# Patient Record
Sex: Male | Born: 1955 | Race: White | Hispanic: No | Marital: Married | Smoking: Never smoker
Health system: Southern US, Community
[De-identification: ages and names within clinical notes are randomized; demographics above are authoritative.]

## PROBLEM LIST (undated history)

## (undated) DIAGNOSIS — G459 Transient cerebral ischemic attack, unspecified: Secondary | ICD-10-CM

## (undated) DIAGNOSIS — N400 Enlarged prostate without lower urinary tract symptoms: Secondary | ICD-10-CM

## (undated) DIAGNOSIS — R7303 Prediabetes: Secondary | ICD-10-CM

## (undated) DIAGNOSIS — E785 Hyperlipidemia, unspecified: Secondary | ICD-10-CM

## (undated) DIAGNOSIS — I10 Essential (primary) hypertension: Secondary | ICD-10-CM

## (undated) DIAGNOSIS — F419 Anxiety disorder, unspecified: Secondary | ICD-10-CM

## (undated) HISTORY — PX: HERNIA REPAIR: SHX51

## (undated) HISTORY — PX: BACK SURGERY: SHX140

---

## 2013-11-27 DIAGNOSIS — M545 Low back pain, unspecified: Secondary | ICD-10-CM | POA: Insufficient documentation

## 2014-09-16 DIAGNOSIS — E113291 Type 2 diabetes mellitus with mild nonproliferative diabetic retinopathy without macular edema, right eye: Secondary | ICD-10-CM | POA: Insufficient documentation

## 2015-04-14 DIAGNOSIS — M25521 Pain in right elbow: Secondary | ICD-10-CM | POA: Insufficient documentation

## 2016-12-29 ENCOUNTER — Other Ambulatory Visit: Payer: Self-pay | Admitting: Orthopedic Surgery

## 2016-12-29 DIAGNOSIS — M5442 Lumbago with sciatica, left side: Secondary | ICD-10-CM

## 2016-12-29 DIAGNOSIS — M545 Low back pain: Secondary | ICD-10-CM

## 2016-12-29 DIAGNOSIS — M5441 Lumbago with sciatica, right side: Secondary | ICD-10-CM

## 2016-12-29 DIAGNOSIS — G8929 Other chronic pain: Secondary | ICD-10-CM

## 2017-01-10 ENCOUNTER — Ambulatory Visit
Admission: RE | Admit: 2017-01-10 | Discharge: 2017-01-10 | Disposition: A | Discharge status: Home / Self Care | Payer: Medicare HMO | Source: Ambulatory Visit | Attending: Orthopedic Surgery | Admitting: Orthopedic Surgery

## 2017-01-10 DIAGNOSIS — M5441 Lumbago with sciatica, right side: Secondary | ICD-10-CM | POA: Insufficient documentation

## 2017-01-10 DIAGNOSIS — M5136 Other intervertebral disc degeneration, lumbar region: Secondary | ICD-10-CM | POA: Diagnosis not present

## 2017-01-10 DIAGNOSIS — M545 Low back pain: Secondary | ICD-10-CM

## 2017-01-10 DIAGNOSIS — G8929 Other chronic pain: Secondary | ICD-10-CM | POA: Diagnosis not present

## 2017-01-10 DIAGNOSIS — M5442 Lumbago with sciatica, left side: Secondary | ICD-10-CM | POA: Insufficient documentation

## 2017-01-10 DIAGNOSIS — M48061 Spinal stenosis, lumbar region without neurogenic claudication: Secondary | ICD-10-CM | POA: Insufficient documentation

## 2017-07-27 ENCOUNTER — Other Ambulatory Visit: Payer: Self-pay | Admitting: Student

## 2017-07-27 ENCOUNTER — Other Ambulatory Visit: Payer: Self-pay | Admitting: Neurosurgery

## 2017-07-27 DIAGNOSIS — G9519 Other vascular myelopathies: Secondary | ICD-10-CM

## 2017-07-27 DIAGNOSIS — M48062 Spinal stenosis, lumbar region with neurogenic claudication: Secondary | ICD-10-CM

## 2017-08-08 ENCOUNTER — Ambulatory Visit
Admission: RE | Admit: 2017-08-08 | Discharge: 2017-08-08 | Disposition: A | Discharge status: Home / Self Care | Payer: Medicare HMO | Source: Ambulatory Visit | Attending: Student | Admitting: Student

## 2017-08-08 DIAGNOSIS — M48062 Spinal stenosis, lumbar region with neurogenic claudication: Secondary | ICD-10-CM | POA: Diagnosis present

## 2017-08-08 DIAGNOSIS — M5136 Other intervertebral disc degeneration, lumbar region: Secondary | ICD-10-CM | POA: Insufficient documentation

## 2017-08-08 DIAGNOSIS — M5126 Other intervertebral disc displacement, lumbar region: Secondary | ICD-10-CM | POA: Diagnosis not present

## 2017-08-08 DIAGNOSIS — M2578 Osteophyte, vertebrae: Secondary | ICD-10-CM | POA: Insufficient documentation

## 2017-08-08 DIAGNOSIS — G9519 Other vascular myelopathies: Secondary | ICD-10-CM

## 2017-09-06 DIAGNOSIS — E119 Type 2 diabetes mellitus without complications: Secondary | ICD-10-CM | POA: Insufficient documentation

## 2019-04-04 ENCOUNTER — Observation Stay
Admission: EM | Admit: 2019-04-04 | Discharge: 2019-04-05 | Disposition: A | Discharge status: Home / Self Care | Payer: Medicare HMO | Attending: Internal Medicine | Admitting: Internal Medicine

## 2019-04-04 ENCOUNTER — Emergency Department: Payer: Medicare HMO

## 2019-04-04 ENCOUNTER — Other Ambulatory Visit: Payer: Self-pay

## 2019-04-04 DIAGNOSIS — Z833 Family history of diabetes mellitus: Secondary | ICD-10-CM | POA: Insufficient documentation

## 2019-04-04 DIAGNOSIS — Z6838 Body mass index (BMI) 38.0-38.9, adult: Secondary | ICD-10-CM | POA: Diagnosis not present

## 2019-04-04 DIAGNOSIS — Z79899 Other long term (current) drug therapy: Secondary | ICD-10-CM | POA: Diagnosis not present

## 2019-04-04 DIAGNOSIS — R29702 NIHSS score 2: Secondary | ICD-10-CM | POA: Diagnosis not present

## 2019-04-04 DIAGNOSIS — K219 Gastro-esophageal reflux disease without esophagitis: Secondary | ICD-10-CM | POA: Diagnosis not present

## 2019-04-04 DIAGNOSIS — I63422 Cerebral infarction due to embolism of left anterior cerebral artery: Secondary | ICD-10-CM

## 2019-04-04 DIAGNOSIS — I639 Cerebral infarction, unspecified: Secondary | ICD-10-CM | POA: Diagnosis not present

## 2019-04-04 DIAGNOSIS — R2681 Unsteadiness on feet: Secondary | ICD-10-CM | POA: Diagnosis not present

## 2019-04-04 DIAGNOSIS — Z20822 Contact with and (suspected) exposure to covid-19: Secondary | ICD-10-CM | POA: Insufficient documentation

## 2019-04-04 DIAGNOSIS — N400 Enlarged prostate without lower urinary tract symptoms: Secondary | ICD-10-CM | POA: Diagnosis not present

## 2019-04-04 DIAGNOSIS — Z7984 Long term (current) use of oral hypoglycemic drugs: Secondary | ICD-10-CM | POA: Diagnosis not present

## 2019-04-04 DIAGNOSIS — E1151 Type 2 diabetes mellitus with diabetic peripheral angiopathy without gangrene: Secondary | ICD-10-CM | POA: Diagnosis not present

## 2019-04-04 DIAGNOSIS — I10 Essential (primary) hypertension: Secondary | ICD-10-CM | POA: Diagnosis not present

## 2019-04-04 DIAGNOSIS — E1169 Type 2 diabetes mellitus with other specified complication: Secondary | ICD-10-CM | POA: Diagnosis not present

## 2019-04-04 DIAGNOSIS — Z8249 Family history of ischemic heart disease and other diseases of the circulatory system: Secondary | ICD-10-CM | POA: Insufficient documentation

## 2019-04-04 DIAGNOSIS — E785 Hyperlipidemia, unspecified: Secondary | ICD-10-CM | POA: Diagnosis not present

## 2019-04-04 DIAGNOSIS — R471 Dysarthria and anarthria: Secondary | ICD-10-CM | POA: Diagnosis present

## 2019-04-04 DIAGNOSIS — E669 Obesity, unspecified: Secondary | ICD-10-CM

## 2019-04-04 HISTORY — DX: Benign prostatic hyperplasia without lower urinary tract symptoms: N40.0

## 2019-04-04 HISTORY — DX: Anxiety disorder, unspecified: F41.9

## 2019-04-04 HISTORY — DX: Transient cerebral ischemic attack, unspecified: G45.9

## 2019-04-04 HISTORY — DX: Essential (primary) hypertension: I10

## 2019-04-04 LAB — COMPREHENSIVE METABOLIC PANEL
ALT: 16 U/L (ref 0–44)
AST: 17 U/L (ref 15–41)
Albumin: 4 g/dL (ref 3.5–5.0)
Alkaline Phosphatase: 76 U/L (ref 38–126)
Anion gap: 5 (ref 5–15)
BUN: 22 mg/dL (ref 8–23)
CO2: 25 mmol/L (ref 22–32)
Calcium: 9.2 mg/dL (ref 8.9–10.3)
Chloride: 110 mmol/L (ref 98–111)
Creatinine, Ser: 0.94 mg/dL (ref 0.61–1.24)
GFR calc Af Amer: >60 mL/min (ref >60)
GFR calc non Af Amer: >60 mL/min (ref >60)
Glucose, Bld: 149 mg/dL — ABNORMAL HIGH (ref 70–99)
Potassium: 4.1 mmol/L (ref 3.5–5.1)
Sodium: 140 mmol/L (ref 135–145)
Total Bilirubin: 0.8 mg/dL (ref 0.3–1.2)
Total Protein: 7.3 g/dL (ref 6.5–8.1)

## 2019-04-04 LAB — CBC
HCT: 43 % (ref 39.0–52.0)
Hemoglobin: 13.9 g/dL (ref 13.0–17.0)
MCH: 29.4 pg (ref 26.0–34.0)
MCHC: 32.3 g/dL (ref 30.0–36.0)
MCV: 91.1 fL (ref 80.0–100.0)
Platelets: 262 10*3/uL (ref 150–400)
RBC: 4.72 MIL/uL (ref 4.22–5.81)
RDW: 13.2 % (ref 11.5–15.5)
WBC: 7.7 10*3/uL (ref 4.0–10.5)
nRBC: 0 % (ref 0.0–0.2)

## 2019-04-04 LAB — APTT: aPTT: 35 seconds (ref 24–36)

## 2019-04-04 LAB — PROTIME-INR
INR: 1.1 (ref 0.8–1.2)
Prothrombin Time: 13.7 seconds (ref 11.4–15.2)

## 2019-04-04 LAB — DIFFERENTIAL
Abs Immature Granulocytes: 0.03 10*3/uL (ref 0.00–0.07)
Basophils Absolute: 0.1 10*3/uL (ref 0.0–0.1)
Basophils Relative: 1 %
Eosinophils Absolute: 0.2 10*3/uL (ref 0.0–0.5)
Eosinophils Relative: 2 %
Immature Granulocytes: 0 %
Lymphocytes Relative: 23 %
Lymphs Abs: 1.8 10*3/uL (ref 0.7–4.0)
Monocytes Absolute: 0.6 10*3/uL (ref 0.1–1.0)
Monocytes Relative: 8 %
Neutro Abs: 5.1 10*3/uL (ref 1.7–7.7)
Neutrophils Relative %: 66 %

## 2019-04-04 LAB — GLUCOSE, CAPILLARY
Glucose-Capillary: 125 mg/dL — ABNORMAL HIGH (ref 70–99)
Glucose-Capillary: 127 mg/dL — ABNORMAL HIGH (ref 70–99)

## 2019-04-04 MED ORDER — STROKE: EARLY STAGES OF RECOVERY BOOK: Freq: Once | Status: AC

## 2019-04-04 MED ORDER — ENOXAPARIN SODIUM 40 MG/0.4ML ~~LOC~~ SOLN
40.0000 mg | SUBCUTANEOUS | Status: DC
  Administered 2019-04-05: 40 mg via SUBCUTANEOUS
  Filled 2019-04-04: qty 0.4

## 2019-04-04 MED ORDER — ACETAMINOPHEN 650 MG RE SUPP: 650.0000 mg | Freq: Four times a day (QID) | RECTAL | Status: DC | PRN

## 2019-04-04 MED ORDER — SENNOSIDES-DOCUSATE SODIUM 8.6-50 MG PO TABS: 1.0000 | ORAL_TABLET | Freq: Every evening | ORAL | Status: DC | PRN

## 2019-04-04 MED ORDER — INSULIN ASPART 100 UNIT/ML ~~LOC~~ SOLN
0.0000 [IU] | Freq: Three times a day (TID) | SUBCUTANEOUS | Status: DC
  Administered 2019-04-05: 13:00:00 1 [IU] via SUBCUTANEOUS
  Filled 2019-04-04: qty 1

## 2019-04-04 MED ORDER — SODIUM CHLORIDE 0.9% FLUSH
3.0000 mL | Freq: Once | INTRAVENOUS | Status: AC
  Administered 2019-04-05: 01:00:00 3 mL via INTRAVENOUS

## 2019-04-04 MED ORDER — ACETAMINOPHEN 325 MG PO TABS: 650.0000 mg | ORAL_TABLET | Freq: Four times a day (QID) | ORAL | Status: DC | PRN

## 2019-04-04 MED ORDER — HYDRALAZINE HCL 20 MG/ML IJ SOLN: 5.0000 mg | Freq: Four times a day (QID) | INTRAMUSCULAR | Status: DC | PRN

## 2019-04-04 MED ORDER — ASPIRIN EC 325 MG PO TBEC
325.0000 mg | DELAYED_RELEASE_TABLET | Freq: Every day | ORAL | Status: DC
  Administered 2019-04-05: 325 mg via ORAL
  Filled 2019-04-04: qty 1

## 2019-04-04 MED ORDER — ASPIRIN 300 MG RE SUPP: 300.0000 mg | Freq: Every day | RECTAL | Status: DC

## 2019-04-04 MED ORDER — ASPIRIN 81 MG PO CHEW
325.0000 mg | CHEWABLE_TABLET | Freq: Once | ORAL | Status: AC
  Administered 2019-04-05: 01:00:00 325 mg via ORAL
  Filled 2019-04-04: qty 5

## 2019-04-04 MED ORDER — TAMSULOSIN HCL 0.4 MG PO CAPS
0.4000 mg | ORAL_CAPSULE | Freq: Every day | ORAL | Status: DC
  Administered 2019-04-05: 0.4 mg via ORAL
  Filled 2019-04-04: qty 1

## 2019-04-04 NOTE — ED Notes (Signed)
Hospitalist at bedside 

## 2019-04-04 NOTE — ED Provider Notes (Signed)
[facility] Emergency Department Provider Note  ____________________________________________   First MD Initiated Contact with Patient 04/04/19 1849     (approximate)  I have reviewed the triage vital signs and the nursing notes.  History  Chief Complaint No chief complaint on file.    HPI Keith Carter is a 64 y.o. male with history TIA, HTN, HLD, GERD, BPH, DM who presents to the emergency department for slurred speech.  Patient reports onset of symptoms last night at 7 PM.  Constant since onset.  He states his thinking is clear and seamless, but getting words out results in slurred speech and sometimes stuttering.  Nothing makes his symptoms better or worse.  Denies any facial droop, visual changes, extremity weakness, numbness, tingling.  Does report a history of prior episode of slurred speech, which resolved on its own after several hours.  Today symptoms, however, have persisted since last night, which prompted him to seek care.   Past Medical Hx Past Medical History:  Diagnosis Date  . Anxiety   . Enlarged prostate   . Hypertension   . TIA (transient ischemic attack)     Problem List There are no problems to display for this patient.   Past Surgical Hx Noncontributory  Medications Amlodipine Atorvastatin Lansoprazole Lisinopril Metformin Tamsulosin  Allergies Patient has no allergy information on record.  Family Hx No family history on file.  Social Hx Social History   Tobacco Use  . Smoking status: Never Smoker  . Smokeless tobacco: Never Used  Substance Use Topics  . Alcohol use: Yes    Comment: social  . Drug use: Never     Review of Systems  Constitutional: Negative for fever, chills. Eyes: Negative for visual changes. ENT: Negative for sore throat. Cardiovascular: Negative for chest pain. Respiratory: Negative for shortness of breath. Gastrointestinal: Negative for nausea, vomiting.  Genitourinary: Negative for  dysuria. Musculoskeletal: Negative for leg swelling. Skin: Negative for rash. Neurological: Negative for headaches.  Positive for slurred speech.   Physical Exam  Vital Signs: ED Triage Vitals  Enc Vitals Group     BP 04/04/19 1527 (!) 141/80     Pulse Rate 04/04/19 1527 72     Resp 04/04/19 1527 18     Temp 04/04/19 1952 98.1 F (36.7 C)     Temp Source 04/04/19 1952 Oral     SpO2 04/04/19 1527 95 %     Weight 04/04/19 1528 250 lb (113.4 kg)     Height 04/04/19 1528 5\' 11"  (1.803 m)     Head Circumference --      Peak Flow --      Pain Score 04/04/19 1528 0     Pain Loc --      Pain Edu? --      Excl. in [place]? --     Constitutional: Alert and oriented. Weight 113 kg.  Head: Normocephalic. Atraumatic. Very subtle right sided facial droop really only noticeable with talking.  Eyes: Conjunctivae clear, sclera anicteric. Pupils equal and symmetric.  EOMI.  Visual fields intact. Nose: No masses or lesions. No congestion or rhinorrhea. Mouth/Throat: Wearing mask.  Neck: No stridor. Trachea midline.  Cardiovascular: Normal rate, regular rhythm. Extremities well perfused. Respiratory: Normal respiratory effort.  Lungs CTAB. Gastrointestinal: Soft. Non-distended. Non-tender.  Genitourinary: Deferred. Musculoskeletal: No lower extremity edema. No deformities. Neurologic: Alert and oriented.  Follows commands.  EOMI.  Visual fields intact.  Very subtle facial droop. UE and LE strength 5/5 and symmetric. SILT.  No aphasia.  Mild to moderate dysarthria.  Repetition intact.  Able to read, though with notable dysarthria.  Able to describe pictures. NIHSS 2. Skin: Skin is warm, dry and intact. No rash noted. Psychiatric: Mood and affect are appropriate for situation.  EKG  Personally reviewed and interpreted by myself.   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  Moderate to marked chronic microvascular ischemic changes.   MRI: IMPRESSION: Background pattern of atrophy and chronic small-vessel ischemic change throughout the brain. Acute subcortical and deep white matter infarction in the left posterior frontal lobe. No large vessel territory infarction. No hemorrhage or mass effect.   Procedures  Procedure(s) performed (including critical care):  Procedures   Initial Impression / Assessment and Plan / MDM / ED Course  64 y.o. male who presents to the ED for slurred speech since last night at 7 PM.  Ddx: TIA/CVA, stress response, less likely complex/atypical migraine given no headache. Given LKW was last night at 7 PM, not a tPA candidate.   CT scan w/o acute infarction. Labs thus far w/o actionable derangements.   Discussed concern for TIA/CVA as etiology of his symptoms and recommendation for admission for further work up. 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 Discussed the serious nature of leaving w/o further evaluation, including recurrent stroke with more devastating symptoms, including permanent disability or death. He voices understanding of this. He is agreeable to pursing MRI and revisiting discussion after this.  MRI is consistent with acute stroke. He is agreeable w/ admission at this time. Will discuss with hospitalist.    _______________________________  As part of my medical decision making I have reviewed available labs, radiology tests, reviewed old records.  Final Clinical Impression(s) / ED Diagnosis  Final diagnoses:  Dysarthria  Cerebrovascular accident (CVA), unspecified mechanism ([place])       Note:  This document was prepared using Dragon voice recognition software and may  include unintentional dictation errors.   Lilia Pro., MD 04/04/19 2125

## 2019-04-04 NOTE — H&P (Signed)
Chief Complaint: I have slurred speech HPI:  Keith Carter is a 64 year old pleasant male with history of Obesity, TIA in the past(not on antiplatelet therapy), HTN, HLD, GERD, BPH,  and Type II DM who presented  to the emergency department this evening on account of slurred speech. Onset of symptoms since 7 pm last night. No preceding aura. Unfortunately, patient could not come in yesterday as he had to care for his sick wife. He denied any associated motor deficits.  He describes persistent symptoms of slurring of speech and sometimes stuttering.Denies any falls. He does reports previous episodes of slurred speech that resolves with time.  Today symptoms,have persisted.He denies headache. Denies any difficulty with eating or swallowing of liquids.  Past Medical History:  Diagnosis Date  . Anxiety   . Enlarged prostate   . Hypertension   . TIA (transient ischemic attack)     No family history on file. Social History:  reports that he has never smoked. He has never used smokeless tobacco. He reports current alcohol use. He reports that he does not use drugs.  Allergies: Denies any drug allergies   Results for orders placed or performed during the hospital encounter of 04/04/19 (from the past 48 hour(s))  Protime-INR     Status: None   Collection Time: 04/04/19  3:33 PM  Result Value Ref Range   Prothrombin Time 13.7 11.4 - 15.2 seconds   INR 1.1 0.8 - 1.2    Comment: (NOTE) INR goal varies based on device and disease states. Performed at [facility], [address] [place], [place] [address]   APTT     Status: None   Collection Time: 04/04/19  3:33 PM  Result Value Ref Range   aPTT 35 24 - 36 seconds    Comment: Performed at [facility], [address] [place], [place] [address]  CBC     Status: None   Collection Time: 04/04/19  3:33 PM  Result Value Ref Range   WBC 7.7 4.0 - 10.5 K/uL   RBC 4.72 4.22 - 5.81 MIL/uL   Hemoglobin 13.9 13.0 - 17.0 g/dL   HCT  43.0 39.0 - 52.0 %   MCV 91.1 80.0 - 100.0 fL   MCH 29.4 26.0 - 34.0 pg   MCHC 32.3 30.0 - 36.0 g/dL   RDW 13.2 11.5 - 15.5 %   Platelets 262 150 - 400 K/uL   nRBC 0.0 0.0 - 0.2 %    Comment: Performed at [facility], [address] [place], [place] [address]  Differential     Status: None   Collection Time: 04/04/19  3:33 PM  Result Value Ref Range   Neutrophils Relative % 66 %   Neutro Abs 5.1 1.7 - 7.7 K/uL   Lymphocytes Relative 23 %   Lymphs Abs 1.8 0.7 - 4.0 K/uL   Monocytes Relative 8 %   Monocytes Absolute 0.6 0.1 - 1.0 K/uL   Eosinophils Relative 2 %   Eosinophils Absolute 0.2 0.0 - 0.5 K/uL   Basophils Relative 1 %   Basophils Absolute 0.1 0.0 - 0.1 K/uL   Immature Granulocytes 0 %   Abs Immature Granulocytes 0.03 0.00 - 0.07 K/uL    Comment: Performed at [facility], [address] [place],  [address]  Comprehensive metabolic panel     Status: Abnormal   Collection Time: 04/04/19  3:33 PM  Result Value Ref Range   Sodium 140 135 - 145 mmol/L  Potassium 4.1 3.5 - 5.1 mmol/L   Chloride 110 98 - 111 mmol/L   CO2 25 22 - 32 mmol/L   Glucose, Bld 149 (H) 70 - 99 mg/dL    Comment: Glucose reference range applies only to samples taken after fasting for at least 8 hours.   BUN 22 8 - 23 mg/dL   Creatinine, Ser 0.94 0.61 - 1.24 mg/dL   Calcium 9.2 8.9 - 10.3 mg/dL   Total Protein 7.3 6.5 - 8.1 g/dL   Albumin 4.0 3.5 - 5.0 g/dL   AST 17 15 - 41 U/L   ALT 16 0 - 44 U/L   Alkaline Phosphatase 76 38 - 126 U/L   Total Bilirubin 0.8 0.3 - 1.2 mg/dL   GFR calc non Af Amer >60 >60 mL/min   GFR calc Af Amer >60 >60 mL/min   Anion gap 5 5 - 15    Comment: Performed at [facility], [address] [place], [place] [address]  Glucose, capillary     Status: Abnormal   Collection Time: 04/04/19  3:40 PM  Result Value Ref Range   Glucose-Capillary 125 (H) 70 - 99 mg/dL    Comment: Glucose reference range applies only to samples taken  after fasting for at least 8 hours.   CT HEAD WO CONTRAST  Result Date: 04/04/2019 CLINICAL DATA:  Speech difficulty, left facial droop EXAM: CT HEAD WITHOUT CONTRAST TECHNIQUE: Contiguous axial images were obtained from the base of the skull through the vertex without intravenous contrast. COMPARISON:  None. FINDINGS: Brain: There is no acute intracranial hemorrhage, mass-effect, or edema. Gray-white differentiation is preserved. Patchy and confluent areas of hypoattenuation in the supratentorial white matter are nonspecific but probably reflect moderate to marked chronic microvascular ischemic changes. There is no extra-axial fluid collection. Prominence of the ventricles and sulci reflects minor generalized parenchymal volume loss. Vascular: No hyperdense vessel or unexpected calcification. Skull: Calvarium is unremarkable. Sinuses/Orbits: No acute finding. Other: None. IMPRESSION: No acute intracranial hemorrhage, mass effect, or evidence of acute infarction. Moderate to marked chronic microvascular ischemic changes. Electronically Signed   By: Macy Mis M.D.   On: 04/04/2019 16:20   MR Brain Wo Contrast (neuro protocol)  Result Date: 04/04/2019 CLINICAL DATA:  Dysarthria beginning last night. EXAM: MRI HEAD WITHOUT CONTRAST TECHNIQUE: Multiplanar, multiecho pulse sequences of the brain and surrounding structures were obtained without intravenous contrast. COMPARISON:  Head CT earlier same day FINDINGS: Brain: Diffusion imaging shows a linear focus of acute infarction in the left posterior frontal lobe primarily within the subcortical white matter. No large area of infarction. No other acute finding. Some atrophy of the brainstem. No focal cerebellar insult. Cerebral hemispheres show atrophy with old small vessel infarctions of the thalami, basal ganglia and throughout the cerebral hemispheric white matter. No cortical or large vessel territory infarction. No mass lesion, hemorrhage, hydrocephalus or  extra-axial collection. Vascular: Major vessels at the base of the brain show flow. Skull and upper cervical spine: Negative Sinuses/Orbits: Clear/normal Other: None IMPRESSION: Background pattern of atrophy and chronic small-vessel ischemic change throughout the brain. Acute subcortical and deep white matter infarction in the left posterior frontal lobe. No large vessel territory infarction. No hemorrhage or mass effect. Electronically Signed   By: [place] Chimes M.D.   On: 04/04/2019 21:07    Review of Systems  Constitutional: Negative.   HENT: Negative.   Eyes: Negative.   Respiratory: Negative.   Cardiovascular: Negative.   Gastrointestinal: Negative.   Endocrine: Negative.  Skin: Negative.   Allergic/Immunologic: Negative.   Neurological: Positive for speech difficulty and numbness.  Psychiatric/Behavioral: Negative.     Blood pressure (!) 141/80, pulse 72, temperature 98.1 F (36.7 C), temperature source Oral, resp. rate 18, height 5\' 11"  (1.803 m), weight 113.4 kg, SpO2 95 %. Physical Exam  Constitutional: He is oriented to person, place, and time. He appears well-developed and well-nourished. He is cooperative.  Morbid obesity  HENT:  Head: Normocephalic and atraumatic.  Eyes: Pupils are equal, round, and reactive to light.  Neck: Trachea normal. No thyromegaly present.  Cardiovascular: Normal rate and normal pulses.  Respiratory: Effort normal and breath sounds normal.  GI: A hernia is present. Hernia confirmed positive in the ventral area.  Musculoskeletal:     Cervical back: Full passive range of motion without pain and neck supple.  Neurological: He is alert and oriented to person, place, and time. He has normal strength. A cranial nerve deficit is present. GCS eye subscore is 4. GCS verbal subscore is 5. GCS motor subscore is 6.  Reflex Scores:      Tricep reflexes are 3+ on the right side and 3+ on the left side.      Bicep reflexes are 3+ on the right side and 3+ on  the left side.      Brachioradialis reflexes are 3+ on the right side and 3+ on the left side.      Patellar reflexes are 3+ on the right side and 3+ on the left side.      Achilles reflexes are 3+ on the right side and 3+ on the left side. Right facial droop, mild.   Psychiatric: He has a normal mood and affect. His behavior is normal. Judgment normal. His speech is slurred. Cognition and memory are normal.     Assessment/Plan 1. 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 Patient will also be initiated on statins. ECHO with bubble study is pending. Neurology consult has been requested for morning. PT/OT and speech therapy to assess and advise further management  2. Chronic Essential Hypertension:Allow Permissive Hypertension is advised  3. Morbid obesity with possible underlying OSA. Sleep study as out patient is advised.  4. Type II Diabetes Mellitus:  On Metformin at home. A1c pending for a.m. Insulin sliding scale advised.Reconcile home medications as appropriate. Pharmacy to consulted.  5. History of ventral hernia s/p repair.  6. DVT prophylaxis and education  [place], MD 04/04/2019, 10:25 PM

## 2019-04-04 NOTE — ED Notes (Signed)
This Rn spoke with daughter per pt's request. This RN provided a status update.

## 2019-04-04 NOTE — ED Notes (Signed)
Pt provided with phone to speak to MRI for screening questions.

## 2019-04-04 NOTE — ED Notes (Signed)
ED Provider [place] at bedside. 

## 2019-04-04 NOTE — ED Triage Notes (Signed)
Pt to the er for difficulty speaking. Symptoms started last night. Speech has not returned to normal. Pt has a left side facial droop. Grips are equal. Hx of TIA. Pt  Speech is slurred at times.

## 2019-04-04 NOTE — ED Notes (Signed)
Pt refuses PIV at this time. Pt st "i'm not staying. I'm leaving after the MRI".

## 2019-04-04 NOTE — ED Notes (Signed)
Pr provided with urinal and changed into a gown at this time.

## 2019-04-04 NOTE — ED Notes (Signed)
Pt st noticing speech changes yesterday at 7pm, pt decided not come to hospital yesterday due to " Itake care of my wife who has parkinson's".

## 2019-04-04 NOTE — ED Notes (Signed)
Pt transported to MRI 

## 2019-04-05 ENCOUNTER — Other Ambulatory Visit: Payer: Self-pay

## 2019-04-05 ENCOUNTER — Encounter: Payer: Self-pay | Admitting: Internal Medicine

## 2019-04-05 ENCOUNTER — Inpatient Hospital Stay: Payer: Medicare HMO

## 2019-04-05 ENCOUNTER — Inpatient Hospital Stay: Admit: 2019-04-05 | Payer: Medicare HMO

## 2019-04-05 ENCOUNTER — Inpatient Hospital Stay (HOSPITAL_BASED_OUTPATIENT_CLINIC_OR_DEPARTMENT_OTHER)
Admit: 2019-04-05 | Discharge: 2019-04-05 | Disposition: A | Discharge status: Home / Self Care | Payer: Medicare HMO | Attending: Neurology | Admitting: Neurology

## 2019-04-05 DIAGNOSIS — I10 Essential (primary) hypertension: Secondary | ICD-10-CM

## 2019-04-05 DIAGNOSIS — I6389 Other cerebral infarction: Secondary | ICD-10-CM | POA: Diagnosis not present

## 2019-04-05 DIAGNOSIS — I639 Cerebral infarction, unspecified: Secondary | ICD-10-CM | POA: Diagnosis not present

## 2019-04-05 DIAGNOSIS — E785 Hyperlipidemia, unspecified: Secondary | ICD-10-CM

## 2019-04-05 DIAGNOSIS — N400 Enlarged prostate without lower urinary tract symptoms: Secondary | ICD-10-CM

## 2019-04-05 DIAGNOSIS — R471 Dysarthria and anarthria: Secondary | ICD-10-CM | POA: Diagnosis not present

## 2019-04-05 DIAGNOSIS — E1169 Type 2 diabetes mellitus with other specified complication: Secondary | ICD-10-CM

## 2019-04-05 DIAGNOSIS — E669 Obesity, unspecified: Secondary | ICD-10-CM

## 2019-04-05 LAB — CBC
HCT: 41.1 % (ref 39.0–52.0)
Hemoglobin: 13.6 g/dL (ref 13.0–17.0)
MCH: 29.8 pg (ref 26.0–34.0)
MCHC: 33.1 g/dL (ref 30.0–36.0)
MCV: 89.9 fL (ref 80.0–100.0)
Platelets: 237 10*3/uL (ref 150–400)
RBC: 4.57 MIL/uL (ref 4.22–5.81)
RDW: 13.2 % (ref 11.5–15.5)
WBC: 7.2 10*3/uL (ref 4.0–10.5)
nRBC: 0 % (ref 0.0–0.2)

## 2019-04-05 LAB — BASIC METABOLIC PANEL
Anion gap: 6 (ref 5–15)
BUN: 20 mg/dL (ref 8–23)
CO2: 25 mmol/L (ref 22–32)
Calcium: 8.8 mg/dL — ABNORMAL LOW (ref 8.9–10.3)
Chloride: 108 mmol/L (ref 98–111)
Creatinine, Ser: 0.81 mg/dL (ref 0.61–1.24)
GFR calc Af Amer: >60 mL/min (ref >60)
GFR calc non Af Amer: >60 mL/min (ref >60)
Glucose, Bld: 139 mg/dL — ABNORMAL HIGH (ref 70–99)
Potassium: 4 mmol/L (ref 3.5–5.1)
Sodium: 139 mmol/L (ref 135–145)

## 2019-04-05 LAB — HIV ANTIBODY (ROUTINE TESTING W REFLEX): HIV Screen 4th Generation wRfx: NONREACTIVE

## 2019-04-05 LAB — URINE DRUG SCREEN, QUALITATIVE (ARMC ONLY)
Amphetamines, Ur Screen: NOT DETECTED
Barbiturates, Ur Screen: NOT DETECTED
Benzodiazepine, Ur Scrn: NOT DETECTED
Cannabinoid 50 Ng, Ur ~~LOC~~: NOT DETECTED
Cocaine Metabolite,Ur ~~LOC~~: NOT DETECTED
MDMA (Ecstasy)Ur Screen: NOT DETECTED
Methadone Scn, Ur: NOT DETECTED
Opiate, Ur Screen: NOT DETECTED
Phencyclidine (PCP) Ur S: NOT DETECTED
Tricyclic, Ur Screen: NOT DETECTED

## 2019-04-05 LAB — LIPID PANEL
Cholesterol: 97 mg/dL (ref 0–200)
HDL: 54 mg/dL (ref >40)
LDL Cholesterol: 30 mg/dL (ref 0–99)
Total CHOL/HDL Ratio: 1.8 RATIO
Triglycerides: 67 mg/dL (ref <150)
VLDL: 13 mg/dL (ref 0–40)

## 2019-04-05 LAB — GLUCOSE, CAPILLARY
Glucose-Capillary: 129 mg/dL — ABNORMAL HIGH (ref 70–99)
Glucose-Capillary: 161 mg/dL — ABNORMAL HIGH (ref 70–99)

## 2019-04-05 LAB — HEMOGLOBIN A1C
Hgb A1c MFr Bld: 6.6 % — ABNORMAL HIGH (ref 4.8–5.6)
Hgb A1c MFr Bld: 6.5 % — ABNORMAL HIGH (ref 4.8–5.6)
Mean Plasma Glucose: 142.72 mg/dL
Mean Plasma Glucose: 139.85 mg/dL

## 2019-04-05 LAB — SARS CORONAVIRUS 2 (TAT 6-24 HRS): SARS Coronavirus 2: NEGATIVE

## 2019-04-05 MED ORDER — CLOPIDOGREL BISULFATE 75 MG PO TABS
75.0000 mg | ORAL_TABLET | Freq: Every day | ORAL | Status: DC
  Administered 2019-04-05: 15:00:00 75 mg via ORAL
  Filled 2019-04-05: qty 1

## 2019-04-05 MED ORDER — CLOPIDOGREL BISULFATE 75 MG PO TABS: 75.0000 mg | ORAL_TABLET | Freq: Every day | ORAL | 0 refills | Status: DC

## 2019-04-05 MED ORDER — PERFLUTREN LIPID MICROSPHERE
1.0000 mL | INTRAVENOUS | Status: AC | PRN
  Administered 2019-04-05: 2 mL via INTRAVENOUS
  Filled 2019-04-05: qty 10

## 2019-04-05 MED ORDER — ATORVASTATIN CALCIUM 40 MG PO TABS: 40.0000 mg | ORAL_TABLET | Freq: Every day | ORAL | 0 refills | Status: DC

## 2019-04-05 MED ORDER — ASPIRIN EC 81 MG PO TBEC: 81.0000 mg | DELAYED_RELEASE_TABLET | Freq: Every day | ORAL | 0 refills | Status: AC

## 2019-04-05 MED ORDER — ATORVASTATIN CALCIUM 20 MG PO TABS: 40.0000 mg | ORAL_TABLET | Freq: Every day | ORAL | Status: DC

## 2019-04-05 NOTE — Discharge Summary (Signed)
[place] [facility] [place] NAME: Keith Carter    MR#:  [id number]  DATE OF BIRTH:  Feb 27, 1955  DATE OF ADMISSION:  04/04/2019 ADMITTING PHYSICIAN: Loletha Grayer, MD  DATE OF DISCHARGE: 04/05/2019  PRIMARY CARE PHYSICIAN: [name], NP    ADMISSION DIAGNOSIS:  Stroke [facility]) [I63.9] Dysarthria [R47.1] Cerebrovascular accident (CVA), unspecified mechanism ([place]) [I63.9] CVA (cerebral vascular accident) ([place]) [I63.9]  DISCHARGE DIAGNOSIS:  Active Problems:   Stroke [facility])   CVA (cerebral vascular accident) ([place])   SECONDARY DIAGNOSIS:   Past Medical History:  Diagnosis Date  . Anxiety   . Enlarged prostate   . Hypertension   . TIA (transient ischemic attack)     HOSPITAL COURSE:   1.  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  Seen by neurology and neurology recommended aspirin 81 mg daily and Plavix 75 mg daily for 21 days.  Increase Lipitor to 40 mg daily.  Carotid ultrasound showed minimal plaque.  Echocardiogram with bubble study was a poor study study secondary to body habitus.  Will have follow-up with cardiology as outpatient also.  Patient interested in going home.  Patient did well with speech therapy and physical therapy and outpatient PT and speech therapy recommended.  Spoke with transitional care team to set up outpatient physical therapy in [place]. 2.  Type 2 diabetes mellitus with hyperlipidemia.  Can go back on Glucophage as outpatient.  Lipitor increased to 40 mg daily.  LDL 30 and already at goal. 3.  Essential hypertension.  Allow permissive hypertension here since he is going home soon I will have him on lisinopril as outpatient and hold Norvasc for right now.  If blood pressure elevated at follow-up appointment can restart the Norvasc. 4.  Obesity with a BMI of 38.15.  Weight loss needed.  Recommend outpatient sleep study 5.  BPH on Flomax  DISCHARGE CONDITIONS:   Satisfactory  CONSULTS OBTAINED:   Treatment Team:  Catarina Hartshorn, MD  DRUG ALLERGIES:  No Known Allergies  DISCHARGE MEDICATIONS:   Allergies as of 04/05/2019   No Known Allergies     Medication List    STOP taking these medications   amLODipine 10 MG tablet Commonly known as: NORVASC   celecoxib 200 MG capsule Commonly known as: CELEBREX     TAKE these medications   aspirin EC 81 MG tablet Take 1 tablet (81 mg total) by mouth daily.   atorvastatin 40 MG tablet Commonly known as: LIPITOR Take 1 tablet (40 mg total) by mouth daily at 6 PM. What changed:   medication strength  how much to take  when to take this   clopidogrel 75 MG tablet Commonly known as: PLAVIX Take 1 tablet (75 mg total) by mouth daily.   lisinopril 40 MG tablet Commonly known as: ZESTRIL Take 40 mg by mouth at bedtime.   metFORMIN 500 MG 24 hr tablet Commonly known as: GLUCOPHAGE-XR Take 500 mg by mouth 2 (two) times daily.   PARoxetine 40 MG tablet Commonly known as: PAXIL Take 40 mg by mouth at bedtime.   tamsulosin 0.4 MG Caps capsule Commonly known as: FLOMAX Take 0.8 mg by mouth at bedtime.        DISCHARGE INSTRUCTIONS:   Follow-up PMD 5 days Follow-up cardiology 2 weeks  If you experience worsening of your admission symptoms, develop shortness of breath, life threatening emergency, suicidal or homicidal thoughts you must seek medical attention immediately by calling 911 or calling  your MD immediately  if symptoms less severe.  You Must read complete instructions/literature along with all the possible adverse reactions/side effects for all the Medicines you take and that have been prescribed to you. Take any new Medicines after you have completely understood and accept all the possible adverse reactions/side effects.   Please note  You were cared for by a hospitalist during your hospital stay. If you have any questions about your discharge medications or the care you received while you were in the  hospital after you are discharged, you can call the unit and asked to speak with the hospitalist on call if the hospitalist that took care of you is not available. Once you are discharged, your primary care physician will handle any further medical issues. Please note that NO REFILLS for any discharge medications will be authorized once you are discharged, as it is imperative that you return to your primary care physician (or establish a relationship with a primary care physician if you do not have one) for your aftercare needs so that they can reassess your need for medications and monitor your lab values.    Today   CHIEF COMPLAINT:  No chief complaint on file.   HISTORY OF PRESENT ILLNESS:  Keith Carter  is a 64 y.o. male came in with difficulty speaking   VITAL SIGNS:  Blood pressure 138/90, pulse 81, temperature 98.3 F (36.8 C), temperature source Oral, resp. rate 18, height 5\' 11"  (1.803 m), weight 124.1 kg, SpO2 96 %.  I/O:    Intake/Output Summary (Last 24 hours) at 04/05/2019 1407 Last data filed at 04/05/2019 1330 Gross per 24 hour  Intake 1203 ml  Output 1000 ml  Net 203 ml    PHYSICAL EXAMINATION:  GENERAL:  64 y.o.-year-old patient lying in the bed with no acute distress.  EYES: Pupils equal, round, reactive to light and accommodation. No scleral icterus.  HEENT: Head atraumatic, normocephalic. Oropharynx and nasopharynx clear.  NECK:  Supple, no jugular venous distention. No thyroid enlargement, no tenderness.  LUNGS: Normal breath sounds bilaterally, no wheezing, rales,rhonchi or crepitation. No use of accessory muscles of respiration.  CARDIOVASCULAR: S1, S2 normal. No murmurs, rubs, or gallops.  ABDOMEN: Soft, non-tender, non-distended. Bowel sounds present. No organomegaly or mass.  EXTREMITIES: No pedal edema, cyanosis, or clubbing.  NEUROLOGIC: Patient with some slurred speech.  Muscle strength 5/5 in all extremities. Sensation intact. Gait not checked.   PSYCHIATRIC: The patient is alert and oriented x 3.  SKIN: No obvious rash, lesion, or ulcer.   DATA REVIEW:   CBC Recent Labs  Lab 04/05/19 0725  WBC 7.2  HGB 13.6  HCT 41.1  PLT 237    Chemistries  Recent Labs  Lab 04/04/19 1533 04/04/19 1533 04/05/19 0725  NA 140   < > 139  K 4.1   < > 4.0  CL 110   < > 108  CO2 25   < > 25  GLUCOSE 149*   < > 139*  BUN 22   < > 20  CREATININE 0.94   < > 0.81  CALCIUM 9.2   < > 8.8*  AST 17  --   --   ALT 16  --   --   ALKPHOS 76  --   --   BILITOT 0.8  --   --    < > = values in this interval not displayed.    Microbiology Results  Results for orders placed or performed during the hospital  encounter of 04/04/19  SARS CORONAVIRUS 2 (TAT 6-24 HRS) Nasopharyngeal Nasopharyngeal Swab     Status: None   Collection Time: 04/04/19 10:01 PM   Specimen: Nasopharyngeal Swab  Result Value Ref Range Status   SARS Coronavirus 2 NEGATIVE NEGATIVE Final    Comment: (NOTE) SARS-CoV-2 target nucleic acids are NOT DETECTED. The SARS-CoV-2 RNA is generally detectable in upper and lower respiratory specimens during the acute phase of infection. Negative results do not preclude SARS-CoV-2 infection, do not rule out co-infections with other pathogens, and should not be used as the sole basis for treatment or other patient management decisions. Negative results must be combined with clinical observations, patient history, and epidemiological information. The expected result is Negative. Fact Sheet for Patients: SugarRoll.be Fact Sheet for Healthcare Providers: https://www.woods-mathews.com/ This test is not yet approved or cleared by the [place] FDA and  has been authorized for detection and/or diagnosis of SARS-CoV-2 by FDA under an Emergency Use Authorization (EUA). This EUA will remain  in effect (meaning this test can be used) for the duration of the COVID-19 declaration under Section 56  4(b)(1) of the Act, 21 U.S.C. section 360bbb-3(b)(1), unless the authorization is terminated or revoked sooner. Performed at [facility], [place] [address], [place], [place] [address]     RADIOLOGY:  CT HEAD WO CONTRAST  Result Date: 04/04/2019 CLINICAL DATA:  Speech difficulty, left facial droop EXAM: CT HEAD WITHOUT CONTRAST TECHNIQUE: Contiguous axial images were obtained from the base of the skull through the vertex without intravenous contrast. COMPARISON:  None. FINDINGS: Brain: There is no acute intracranial hemorrhage, mass-effect, or edema. Gray-white differentiation is preserved. Patchy and confluent areas of hypoattenuation in the supratentorial white matter are nonspecific but probably reflect moderate to marked chronic microvascular ischemic changes. There is no extra-axial fluid collection. Prominence of the ventricles and sulci reflects minor generalized parenchymal volume loss. Vascular: No hyperdense vessel or unexpected calcification. Skull: Calvarium is unremarkable. Sinuses/Orbits: No acute finding. Other: None. IMPRESSION: No acute intracranial hemorrhage, mass effect, or evidence of acute infarction. Moderate to marked chronic microvascular ischemic changes. Electronically Signed   By: Macy Mis M.D.   On: 04/04/2019 16:20   MR Brain Wo Contrast (neuro protocol)  Result Date: 04/04/2019 CLINICAL DATA:  Dysarthria beginning last night. EXAM: MRI HEAD WITHOUT CONTRAST TECHNIQUE: Multiplanar, multiecho pulse sequences of the brain and surrounding structures were obtained without intravenous contrast. COMPARISON:  Head CT earlier same day FINDINGS: Brain: Diffusion imaging shows a linear focus of acute infarction in the left posterior frontal lobe primarily within the subcortical white matter. No large area of infarction. No other acute finding. Some atrophy of the brainstem. No focal cerebellar insult. Cerebral hemispheres show atrophy with old small vessel infarctions of the  thalami, basal ganglia and throughout the cerebral hemispheric white matter. No cortical or large vessel territory infarction. No mass lesion, hemorrhage, hydrocephalus or extra-axial collection. Vascular: Major vessels at the base of the brain show flow. Skull and upper cervical spine: Negative Sinuses/Orbits: Clear/normal Other: None IMPRESSION: Background pattern of atrophy and chronic small-vessel ischemic change throughout the brain. Acute subcortical and deep white matter infarction in the left posterior frontal lobe. No large vessel territory infarction. No hemorrhage or mass effect. Electronically Signed   By: Nelson Chimes M.D.   On: 04/04/2019 21:07   US Carotid Bilateral (at [facility] and [facility] only)  Result Date: 04/05/2019 CLINICAL DATA:  Stroke. History of hypertension, hyperlipidemia and diabetes. EXAM: BILATERAL CAROTID DUPLEX ULTRASOUND TECHNIQUE:  Gray scale imaging, color Doppler and duplex ultrasound were performed of bilateral carotid and vertebral arteries in the neck. COMPARISON:  None. FINDINGS: Criteria: Quantification of carotid stenosis is based on velocity parameters that correlate the residual internal carotid diameter with NASCET-based stenosis levels, using the diameter of the distal internal carotid lumen as the denominator for stenosis measurement. The following velocity measurements were obtained: RIGHT ICA: 82/23 cm/sec CCA: [id number] cm/sec SYSTOLIC ICA/CCA RATIO:  0.9 ECA: 116 cm/sec LEFT ICA: 72/24 cm/sec CCA: [id number] cm/sec SYSTOLIC ICA/CCA RATIO:  0.8 ECA: 84 cm/sec RIGHT CAROTID ARTERY: There is minimal amount of intimal thickening involving the right carotid bulb (image 16), not resulting in elevated peak systolic velocities within the interrogated course of the right internal carotid artery to suggest a hemodynamically significant stenosis. RIGHT VERTEBRAL ARTERY:  Antegrade flow LEFT CAROTID ARTERY: There is a minimal to moderate amount of eccentric hypoechoic plaque within the left  carotid bulb (image 36 and 49), extending to involve the origin and proximal aspects of the left internal carotid artery (image 54), not resulting in elevated peak systolic velocities within the interrogated course the left internal carotid artery to suggest a hemodynamically significant stenosis. LEFT VERTEBRAL ARTERY:  Antegrade flow IMPRESSION: 1. Minimal to moderate amount of left-sided atherosclerotic plaque, not resulting in a hemodynamically significant stenosis. 2. Minimal amount of right-sided intimal thickening, not resulting in a hemodynamically significant stenosis. Electronically Signed   By: Sandi Mariscal M.D.   On: 04/05/2019 08:57   ECHOCARDIOGRAM COMPLETE BUBBLE STUDY  Result Date: 04/05/2019    ECHOCARDIOGRAM REPORT   Patient Name:   Keith Carter Date of Exam: 04/05/2019 Medical Rec #:  [id number]  Height:       71.0 in Accession #:    [id number] Weight:       273.5 lb Date of Birth:  10-Jun-1955   BSA:          2.409 m Patient Age:    53 years   BP:           144/93 mmHg Patient Gender: M          HR:           82 bpm. Exam Location:  [facility] Procedure: 2D Echo, Color Doppler, Cardiac Doppler, Intracardiac Opacification            Agent and Saline Contrast Bubble Study Indications:     I163.9 Stroke  History:         Patient has no prior history of Echocardiogram examinations.                  TIA; Risk Factors:Hypertension.  Sonographer:     Charmayne Sheer RDCS (AE) Referring Phys:  [id number] LESLIE REYNOLDS Diagnosing Phys: Nelva Bush MD  Sonographer Comments: Technically difficult study due to poor echo windows. Image acquisition challenging due to patient body habitus. IMPRESSIONS  1. Left ventricular ejection fraction, by estimation, is 60 to 65%. The left ventricle has normal function. The left ventricle has no regional wall motion abnormalities. Left ventricular diastolic parameters were normal.  2. Right ventricular systolic function was not well visualized. The right ventricular size is not well  visualized. Tricuspid regurgitation signal is inadequate for assessing PA pressure.  3. The mitral valve was not well visualized. No evidence of mitral valve regurgitation. No evidence of mitral stenosis.  4. The aortic valve is tricuspid. Aortic valve regurgitation is not visualized. There is no aortic valve stenosis.  5. Aortic dilatation noted. There is  mild dilatation of the aortic root measuring 38 mm.  6. Mildly dilated pulmonary artery.  7. Bubble study is limited by poor windows. There does not appear to be a large intracardiac shunt, though a small shunt cannot be entirely excluded. FINDINGS  Left Ventricle: Left ventricular ejection fraction, by estimation, is 60 to 65%. The left ventricle has normal function. The left ventricle has no regional wall motion abnormalities. Definity contrast agent was given IV to delineate the left ventricular  endocardial borders. The left ventricular internal cavity size was normal in size. There is borderline left ventricular hypertrophy. Left ventricular diastolic parameters were normal. Right Ventricle: The right ventricular size is not well visualized. No increase in right ventricular wall thickness. Right ventricular systolic function was not well visualized. Tricuspid regurgitation signal is inadequate for assessing PA pressure. Left Atrium: Left atrial size was not well visualized. Right Atrium: Right atrial size was not well visualized. Pericardium: There is no evidence of pericardial effusion. Mitral Valve: The mitral valve was not well visualized. There is moderate thickening of the mitral valve leaflet(s). There is moderate calcification of the mitral valve leaflet(s). Mild mitral annular calcification. No evidence of mitral valve regurgitation. No evidence of mitral valve stenosis. MV peak gradient, 2.7 mmHg. The mean mitral valve gradient is 1.0 mmHg. Tricuspid Valve: The tricuspid valve is grossly normal. Tricuspid valve regurgitation is not demonstrated.  Aortic Valve: The aortic valve is tricuspid. Aortic valve regurgitation is not visualized. There is no aortic valve stenosis. There is mild calcification of the aortic valve. Aortic valve mean gradient measures 2.0 mmHg. Aortic valve peak gradient measures 5.1 mmHg. Aortic valve area, by VTI measures 3.30 cm. Pulmonic Valve: The pulmonic valve was normal in structure. Pulmonic valve regurgitation is not visualized. No evidence of pulmonic stenosis. Aorta: Aortic dilatation noted. There is mild dilatation of the aortic root measuring 38 mm. Pulmonary Artery: The pulmonary artery is mildly dilated. Venous: The inferior vena cava was not well visualized. IAS/Shunts: The interatrial septum was not well visualized. Agitated saline contrast was given intravenously to evaluate for intracardiac shunting. Bubble study is limited by poor windows. There does not appear to be a large intracardiac shunt, though a small shunt cannot be entirely excluded.  LEFT VENTRICLE PLAX 2D LVIDd:         5.68 cm  Diastology LVIDs:         3.26 cm  LV e' lateral:   10.70 cm/s LV PW:         1.13 cm  LV E/e' lateral: 5.7 LV IVS:        0.87 cm  LV e' medial:    8.05 cm/s LVOT diam:     2.50 cm  LV E/e' medial:  7.5 LV SV:         54 LV SV Index:   23 LVOT Area:     4.91 cm  LEFT ATRIUM         Index LA diam:    4.00 cm 1.66 cm/m  AORTIC VALVE                   PULMONIC VALVE AV Area (Vmax):    3.11 cm    PV Vmax:       1.09 m/s AV Area (Vmean):   3.41 cm    PV Vmean:      73.500 cm/s AV Area (VTI):     3.30 cm    PV VTI:  0.202 m AV Vmax:           113.00 cm/s PV Peak grad:  4.8 mmHg AV Vmean:          69.500 cm/s PV Mean grad:  2.0 mmHg AV VTI:            0.165 m AV Peak Grad:      5.1 mmHg AV Mean Grad:      2.0 mmHg LVOT Vmax:         71.70 cm/s LVOT Vmean:        48.300 cm/s LVOT VTI:          0.111 m LVOT/AV VTI ratio: 0.67  AORTA Ao Root diam: 3.80 cm MITRAL VALVE MV Area (PHT): 3.91 cm    SHUNTS MV Peak grad:  2.7 mmHg     Systemic VTI:  0.11 m MV Mean grad:  1.0 mmHg    Systemic Diam: 2.50 cm MV Vmax:       0.82 m/s MV Vmean:      51.6 cm/s MV Decel Time: 194 msec MV E velocity: 60.60 cm/s MV A velocity: 75.20 cm/s MV E/A ratio:  0.81 Christopher End MD Electronically signed by Nelva Bush MD Signature Date/Time: 04/05/2019/1:35:43 PM    Final       Management plans discussed with the patient, family and they are in agreement.  CODE STATUS:     Code Status Orders  (From admission, onward)         Start     Ordered   04/04/19 2350  Full code  Continuous     04/04/19 2350        Code Status History    This patient has a current code status but no historical code status.   Advance Care Planning Activity      TOTAL TIME TAKING CARE OF THIS PATIENT: 35 minutes.    Loletha Grayer M.D on 04/05/2019 at 2:07 PM  Between 7am to 6pm - Pager - [phone]  After 6pm go to [email or web address] - password [email or web address] [place]  [facility]  CC: Primary care physician; Gauger, Victoriano Lain, NP

## 2019-04-05 NOTE — Consult Note (Signed)
Requesting Physician: Leslye Peer    Chief Complaint: Slurred speech  I have been asked by Dr. Leslye Peer to see this patient in consultation for stroke.  HPI: Keith Carter is an 64 y.o. male with a history of TIA and HTN who was at home on the evening of 3/3 and had the acute onset of difficulty with speech.  Was unable to seek medical attention until yesterday.  There has been some improvement in his speech but he is not back to baseline.  Patient on no antiplatelet therapy at home.  Initial NIHSS of 2.  Date last known well: 04/03/2019 Time last known well: Time: 19:00 tPA Given: No: Outside time window  Past Medical History:  Diagnosis Date  . Anxiety   . Enlarged prostate   . Hypertension   . TIA (transient ischemic attack)     Past Surgical History:  Procedure Laterality Date  . BACK SURGERY    . HERNIA REPAIR      Family history: Parents deceased.  Mother with DM and heart disease.  Father with no known medical problems.  Has a daughter with RA.  Social History:  reports that he has never smoked. He has never used smokeless tobacco. He reports current alcohol use. He reports that he does not use drugs.  Allergies: No Known Allergies  Medications:  I have reviewed the patient's current medications. Prior to Admission:  Medications Prior to Admission  Medication Sig Dispense Refill Last Dose  . amLODipine (NORVASC) 10 MG tablet Take 10 mg by mouth at bedtime.    Past Week at Unknown time  . atorvastatin (LIPITOR) 10 MG tablet Take 10 mg by mouth at bedtime.    Past Week at Unknown time  . celecoxib (CELEBREX) 200 MG capsule Take 200 mg by mouth at bedtime.    Past Week at Unknown time  . lisinopril (ZESTRIL) 40 MG tablet Take 40 mg by mouth at bedtime.    Past Week at Unknown time  . metFORMIN (GLUCOPHAGE-XR) 500 MG 24 hr tablet Take 500 mg by mouth 2 (two) times daily.   Past Week at Unknown time  . PARoxetine (PAXIL) 40 MG tablet Take 40 mg by mouth at bedtime.    Past Week at  Unknown time  . tamsulosin (FLOMAX) 0.4 MG CAPS capsule Take 0.8 mg by mouth at bedtime.    Past Week at Unknown time   Scheduled: . aspirin  300 mg Rectal Daily   Or  . aspirin EC  325 mg Oral Daily  . atorvastatin  40 mg Oral q1800  . enoxaparin (LOVENOX) injection  40 mg Subcutaneous Q24H  . insulin aspart  0-6 Units Subcutaneous TID WC  . tamsulosin  0.4 mg Oral Daily    ROS: History obtained from the patient  General ROS: negative for - chills, fatigue, fever, night sweats, weight gain or weight loss Psychological ROS: negative for - behavioral disorder, hallucinations, memory difficulties, mood swings or suicidal ideation Ophthalmic ROS: negative for - blurry vision, double vision, eye pain or loss of vision ENT ROS: negative for - epistaxis, nasal discharge, oral lesions, sore throat, tinnitus or vertigo Allergy and Immunology ROS: negative for - hives or itchy/watery eyes Hematological and Lymphatic ROS: negative for - bleeding problems, bruising or swollen lymph nodes Endocrine ROS: negative for - galactorrhea, hair pattern changes, polydipsia/polyuria or temperature intolerance Respiratory ROS: negative for - cough, hemoptysis, shortness of breath or wheezing Cardiovascular ROS: negative for - chest pain, dyspnea on exertion, edema or  irregular heartbeat Gastrointestinal ROS: negative for - abdominal pain, diarrhea, hematemesis, nausea/vomiting or stool incontinence Genito-Urinary ROS: negative for - dysuria, hematuria, incontinence or urinary frequency/urgency Musculoskeletal ROS: back pain Neurological ROS: as noted in HPI Dermatological ROS: negative for rash and skin lesion changes  Physical Examination: Blood pressure (!) 144/93, pulse 76, temperature 98.3 F (36.8 C), temperature source Oral, resp. rate 18, height 5\' 11"  (1.803 m), weight 124.1 kg, SpO2 94 %.  HEENT-  Normocephalic, no lesions, without obvious abnormality.  Normal external eye and conjunctiva.   Normal TM's bilaterally.  Normal auditory canals and external ears. Normal external nose, mucus membranes and septum.  Normal pharynx. Cardiovascular- S1, S2 normal, pulses palpable throughout   Lungs- chest clear, no wheezing, rales, normal symmetric air entry Abdomen- soft, non-tender; bowel sounds normal; no masses,  no organomegaly Extremities- no edema Lymph-no adenopathy palpable Musculoskeletal-no joint tenderness, deformity or swelling Skin-warm and dry, no hyperpigmentation, vitiligo, or suspicious lesions  Neurological Examination   Mental Status: Alert, oriented, thought content appropriate.  Speech slurred with word finding difficulties at times.  Able to follow 3 step commands without difficulty. Cranial Nerves: II: Discs flat bilaterally; Visual fields grossly normal, pupils equal, round, reactive to light and accommodation III,IV, VI: ptosis not present, extra-ocular motions intact bilaterally V,VII: smile symmetric, facial light touch sensation normal bilaterally VIII: hearing normal bilaterally IX,X: gag reflex present XI: bilateral shoulder shrug XII: midline tongue extension Motor: Right : Upper extremity   5/5      Lower extremity   Limited due to back pain but able to lift off bed and maintain without difficulty     Left:     Upper extremity   5/5  Lower extremity   5/5 Tone and bulk:normal tone throughout; no atrophy noted Sensory: Pinprick and light touch intact throughout, bilaterally Deep Tendon Reflexes: Symmetric throughout Plantars: Right: mute   Left: mute Cerebellar: Normal finger-to-nose and normal heel-to-shin testing bilaterally Gait: not tested due to safety concerns    Laboratory Studies:  Basic Metabolic Panel: Recent Labs  Lab 04/04/19 1533 04/05/19 0725  NA 140 139  K 4.1 4.0  CL 110 108  CO2 25 25  GLUCOSE 149* 139*  BUN 22 20  CREATININE 0.94 0.81  CALCIUM 9.2 8.8*    Liver Function Tests: Recent Labs  Lab 04/04/19 1533   AST 17  ALT 16  ALKPHOS 76  BILITOT 0.8  PROT 7.3  ALBUMIN 4.0   No results for input(s): LIPASE, AMYLASE in the last 168 hours. No results for input(s): AMMONIA in the last 168 hours.  CBC: Recent Labs  Lab 04/04/19 1533 04/05/19 0725  WBC 7.7 7.2  NEUTROABS 5.1  --   HGB 13.9 13.6  HCT 43.0 41.1  MCV 91.1 89.9  PLT 262 237    Cardiac Enzymes: No results for input(s): CKTOTAL, CKMB, CKMBINDEX, TROPONINI in the last 168 hours.  BNP: Invalid input(s): POCBNP  CBG: Recent Labs  Lab 04/04/19 1540 04/04/19 2341 04/05/19 0805  GLUCAP 125* 127* 129*    Microbiology: Results for orders placed or performed during the hospital encounter of 04/04/19  SARS CORONAVIRUS 2 (TAT 6-24 HRS) Nasopharyngeal Nasopharyngeal Swab     Status: None   Collection Time: 04/04/19 10:01 PM   Specimen: Nasopharyngeal Swab  Result Value Ref Range Status   SARS Coronavirus 2 NEGATIVE NEGATIVE Final    Comment: (NOTE) SARS-CoV-2 target nucleic acids are NOT DETECTED. The SARS-CoV-2 RNA is generally detectable in upper and lower  respiratory specimens during the acute phase of infection. Negative results do not preclude SARS-CoV-2 infection, do not rule out co-infections with other pathogens, and should not be used as the sole basis for treatment or other patient management decisions. Negative results must be combined with clinical observations, patient history, and epidemiological information. The expected result is Negative. Fact Sheet for Patients: SugarRoll.be Fact Sheet for Healthcare Providers: https://www.woods-mathews.com/ This test is not yet approved or cleared by the [place] FDA and  has been authorized for detection and/or diagnosis of SARS-CoV-2 by FDA under an Emergency Use Authorization (EUA). This EUA will remain  in effect (meaning this test can be used) for the duration of the COVID-19 declaration under Section 56 4(b)(1)  of the Act, 21 U.S.C. section 360bbb-3(b)(1), unless the authorization is terminated or revoked sooner. Performed at [facility], [place] [address] [place], [place] [address]     Coagulation Studies: Recent Labs    04/04/19 1533  LABPROT 13.7  INR 1.1    Urinalysis: No results for input(s): COLORURINE, LABSPEC, PHURINE, GLUCOSEU, HGBUR, BILIRUBINUR, KETONESUR, PROTEINUR, UROBILINOGEN, NITRITE, LEUKOCYTESUR in the last 168 hours.  Invalid input(s): APPERANCEUR  Lipid Panel:    Component Value Date/Time   CHOL 97 04/05/2019 0725   TRIG 67 04/05/2019 0725   HDL 54 04/05/2019 0725   CHOLHDL 1.8 04/05/2019 0725   VLDL 13 04/05/2019 0725   LDLCALC 30 04/05/2019 0725    HgbA1C:  Lab Results  Component Value Date   HGBA1C 6.6 (H) 04/04/2019    Urine Drug Screen:      Component Value Date/Time   LABOPIA NONE DETECTED 04/04/2019 2201   COCAINSCRNUR NONE DETECTED 04/04/2019 2201   LABBENZ NONE DETECTED 04/04/2019 2201   AMPHETMU NONE DETECTED 04/04/2019 2201   THCU NONE DETECTED 04/04/2019 2201   LABBARB NONE DETECTED 04/04/2019 2201    Alcohol Level: No results for input(s): ETH in the last 168 hours.  Other results: EKG: sinus rhythm at 71 bpm with premature supraventricular complexes.  Imaging: CT HEAD WO CONTRAST  Result Date: 04/04/2019 CLINICAL DATA:  Speech difficulty, left facial droop EXAM: CT HEAD WITHOUT CONTRAST TECHNIQUE: Contiguous axial images were obtained from the base of the skull through the vertex without intravenous contrast. COMPARISON:  None. FINDINGS: Brain: There is no acute intracranial hemorrhage, mass-effect, or edema. Gray-white differentiation is preserved. Patchy and confluent areas of hypoattenuation in the supratentorial white matter are nonspecific but probably reflect moderate to marked chronic microvascular ischemic changes. There is no extra-axial fluid collection. Prominence of the ventricles and sulci reflects minor generalized  parenchymal volume loss. Vascular: No hyperdense vessel or unexpected calcification. Skull: Calvarium is unremarkable. Sinuses/Orbits: No acute finding. Other: None. IMPRESSION: No acute intracranial hemorrhage, mass effect, or evidence of acute infarction. Moderate to marked chronic microvascular ischemic changes. Electronically Signed   By: Macy Mis M.D.   On: 04/04/2019 16:20   MR Brain Wo Contrast (neuro protocol)  Result Date: 04/04/2019 CLINICAL DATA:  Dysarthria beginning last night. EXAM: MRI HEAD WITHOUT CONTRAST TECHNIQUE: Multiplanar, multiecho pulse sequences of the brain and surrounding structures were obtained without intravenous contrast. COMPARISON:  Head CT earlier same day FINDINGS: Brain: Diffusion imaging shows a linear focus of acute infarction in the left posterior frontal lobe primarily within the subcortical white matter. No large area of infarction. No other acute finding. Some atrophy of the brainstem. No focal cerebellar insult. Cerebral hemispheres show atrophy with old small vessel infarctions of the thalami, basal ganglia and  throughout the cerebral hemispheric white matter. No cortical or large vessel territory infarction. No mass lesion, hemorrhage, hydrocephalus or extra-axial collection. Vascular: Major vessels at the base of the brain show flow. Skull and upper cervical spine: Negative Sinuses/Orbits: Clear/normal Other: None IMPRESSION: Background pattern of atrophy and chronic small-vessel ischemic change throughout the brain. Acute subcortical and deep white matter infarction in the left posterior frontal lobe. No large vessel territory infarction. No hemorrhage or mass effect. Electronically Signed   By: Nelson Chimes M.D.   On: 04/04/2019 21:07   US Carotid Bilateral (at [facility] and [facility] only)  Result Date: 04/05/2019 CLINICAL DATA:  Stroke. History of hypertension, hyperlipidemia and diabetes. EXAM: BILATERAL CAROTID DUPLEX ULTRASOUND TECHNIQUE: Pearline Cables scale imaging,  color Doppler and duplex ultrasound were performed of bilateral carotid and vertebral arteries in the neck. COMPARISON:  None. FINDINGS: Criteria: Quantification of carotid stenosis is based on velocity parameters that correlate the residual internal carotid diameter with NASCET-based stenosis levels, using the diameter of the distal internal carotid lumen as the denominator for stenosis measurement. The following velocity measurements were obtained: RIGHT ICA: 82/23 cm/sec CCA: [id number] cm/sec SYSTOLIC ICA/CCA RATIO:  0.9 ECA: 116 cm/sec LEFT ICA: 72/24 cm/sec CCA: [id number] cm/sec SYSTOLIC ICA/CCA RATIO:  0.8 ECA: 84 cm/sec RIGHT CAROTID ARTERY: There is minimal amount of intimal thickening involving the right carotid bulb (image 16), not resulting in elevated peak systolic velocities within the interrogated course of the right internal carotid artery to suggest a hemodynamically significant stenosis. RIGHT VERTEBRAL ARTERY:  Antegrade flow LEFT CAROTID ARTERY: There is a minimal to moderate amount of eccentric hypoechoic plaque within the left carotid bulb (image 36 and 49), extending to involve the origin and proximal aspects of the left internal carotid artery (image 54), not resulting in elevated peak systolic velocities within the interrogated course the left internal carotid artery to suggest a hemodynamically significant stenosis. LEFT VERTEBRAL ARTERY:  Antegrade flow IMPRESSION: 1. Minimal to moderate amount of left-sided atherosclerotic plaque, not resulting in a hemodynamically significant stenosis. 2. Minimal amount of right-sided intimal thickening, not resulting in a hemodynamically significant stenosis. Electronically Signed   By: Sandi Mariscal M.D.   On: 04/05/2019 08:57    Assessment: 64 y.o. male with a history of HTN and TIA on no antiplatelet therapy who presents with complaints of difficulty with speech.  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   Chronic small vessel disease changes noted as well.  Suspect acute infarct secondary to small vessel disease.  Carotid dopplers show no evidence of hemodynamically significant stenosis.  Echocardiogram pending.  A1c pending, LDL 30.  BP mildly elevated.  Stroke Risk Factors - hypertension  Plan: 1. HgbA1c pending.  Target<7.0 2. PT consult, OT consult, Speech consult 3. Echocardiogram with bubble study pending 4. Prophylactic therapy-Dual antiplatelet therapy with ASA 81mg  and Plavix 75mg  for three weeks with change to ASA 81mg  daily alone as monotherapy after that time. 5. Telemetry monitoring 6. Frequent neuro checks 7. BP control with target BP<140/80   Alexis Goodell, MD Neurology [phone] 04/05/2019, 9:56 AM

## 2019-04-05 NOTE — Progress Notes (Signed)
OT Cancellation Note  Patient Details Name: Keith Carter MRN: [id number] DOB: 06-10-1955   Cancelled Treatment:    Reason Eval/Treat Not Completed: Other (comment);Patient at procedure or test/ unavailable   Patient unavailable for OT evaluation this AM.  Out of room for procedure.  Will follow up as able and as medically appropriate.  Thank you.  Oren Binet 04/05/2019, 8:50 AM

## 2019-04-05 NOTE — Evaluation (Signed)
Occupational Therapy Evaluation Patient Details Name: Keith Carter MRN: [id number] DOB: 25-Dec-1955 Today's Date: 04/05/2019    History of Present Illness Keith Carter is an 64 y.o. male with a history of TIA and HTN who was at home on the evening of 3/3 and had the acute onset of difficulty with speech. There has been some improvement in his speech but he is not back to baseline. MRI found acute infarct.   Clinical Impression   Patient seen for OT evaluation this date.  Patient participated well and is alert and oriented x 4.  Patient states he feels normal except for his slurred speech.  Demonstrated good ability to pace self during communication.  Performed bed mobility to EOB with SBA and extra time using bed rails. Completed UB dressing while seated with SBA and cues for telemetry.  Completed LB dressing sitting/standing and using hip dissociation in sitting with CGA.  Performed toileting with use of RW and CGA for all aspects.  Provided education to patient on goals of OT, use of compensatory techniques, body mechanics/sequencing, safety awareness, and self pacing during ADLs/functional transfers.  Based on today's performance, recommending [place] OT at discharge.  Patient states his children are able to assist with care for his wife upon his return home.    Follow Up Recommendations  Home health OT    Equipment Recommendations  None recommended by OT    Recommendations for Other Services       Precautions / Restrictions Precautions Precautions: None Restrictions Weight Bearing Restrictions: No      Mobility Bed Mobility Overal bed mobility: Needs Assistance Bed Mobility: Supine to Sit     Supine to sit: HOB elevated;Supervision     General bed mobility comments: Requires extra time and use of grab bars to pull self to EOB.  HOB elevated. Close supervision.  Transfers Overall transfer level: Needs assistance Equipment used: Rolling walker (2 wheeled) Transfers: Sit to/from  [facility] Sit to Stand: Min guard Stand pivot transfers: Min guard       General transfer comment: 1 VC for hand placement/sequencing.  Otherwise demonstrated good carryover with safety and self pacing.    Balance Overall balance assessment: Mild deficits observed, not formally tested                                         ADL either performed or assessed with clinical judgement   ADL Overall ADL's : Needs assistance/impaired                 Upper Body Dressing : Set up;Sitting Upper Body Dressing Details (indicate cue type and reason): cues for telemetry Lower Body Dressing: Min guard;Sit to/from stand Lower Body Dressing Details (indicate cue type and reason): requires extra time to doff from hips and prefers to kick of feet while seated Toilet Transfer: Min Fish farm manager Details (indicate cue type and reason): Slowed movement, good self pacing Toileting- Clothing Manipulation and Hygiene: Min guard;Sit to/from stand Toileting - Clothing Manipulation Details (indicate cue type and reason): utilized grab bars appropriately     Functional mobility during ADLs: Min guard;Rolling walker;Cueing for safety General ADL Comments: Required 1 VC to not pick up walker during mobility.  Otherwise demonstrated good self pacing and safety awareness.  Requires extra time.     Vision Baseline Vision/History: No visual deficits Patient Visual Report: No change from baseline Vision  Assessment?: No apparent visual deficits     Perception     Praxis      Pertinent Vitals/Pain Pain Assessment: No/denies pain     Hand Dominance Right   Extremity/Trunk Assessment Upper Extremity Assessment Upper Extremity Assessment: Overall WFL for tasks assessed   Lower Extremity Assessment Lower Extremity Assessment: Defer to PT evaluation   Cervical / Trunk Assessment Cervical / Trunk Assessment: Normal   Communication  Communication Communication: Other (comment);No difficulties(slightly slurred speech)   Cognition Arousal/Alertness: Awake/alert Behavior During Therapy: WFL for tasks assessed/performed Overall Cognitive Status: Within Functional Limits for tasks assessed                                 General Comments: A&Ox4. Pleasant and cooperative.   General Comments  No one-sided deficits of strength noted.  Pt is alert and oriented; able to problem solve.  Demonstrates good carryover of education.    Exercises Other Exercises Other Exercises: Performed bed mobility to EOB to complete dressing.  Required CGA and extra time in sitting and standing.  Completed toileting with CGA for all tasks using RW for functional mobility. Other Exercises: Educated patient on general goals of OT within acute setting Other Exercises: Educated patient on body mechanics/sequencing/hand placement during functional transfers Other Exercises: Educated patient on use of compensatory techniques to safely perform ADLs   Shoulder Instructions      Home Living Family/patient expects to be discharged to:: Private residence Living Arrangements: Spouse/significant other;Children Available Help at Discharge: Available PRN/intermittently;Family;Other (Comment)(Pt is primary caregiver for his wife with Parkinson's.) Type of Home: House Home Access: Ramped entrance     Home Layout: One level     Bathroom Shower/Tub: Walk-in shower         Home Equipment: Cane - single point;Grab bars - tub/shower;Grab bars - toilet   Additional Comments: Patient states he has access to equipment d/t his wife, but he only utilizes SPC.      Prior Functioning/Environment Level of Independence: Independent        Comments: Patient states he was MOD I with all (B/I)ADLs and is his wife's primary caregiver.  Patient reports using [facility] for functional mobility due to back pain.        OT Problem List: Decreased  activity tolerance      OT Treatment/Interventions: Self-care/ADL training;Therapeutic exercise;Energy conservation;Therapeutic activities;Patient/family education    OT Goals(Current goals can be found in the care plan section) Acute Rehab OT Goals Patient Stated Goal: "I just want to feel normal again" OT Goal Formulation: With patient Time For Goal Achievement: 04/19/19 Potential to Achieve Goals: Good  OT Frequency: Min 1X/week   Barriers to D/C: Other (comment)          Co-evaluation              AM-PAC OT "6 Clicks" Daily Activity     Outcome Measure Help from another person eating meals?: None Help from another person taking care of personal grooming?: None Help from another person toileting, which includes using toliet, bedpan, or urinal?: A Little Help from another person bathing (including washing, rinsing, drying)?: A Little Help from another person to put on and taking off regular upper body clothing?: None Help from another person to put on and taking off regular lower body clothing?: A Little 6 Click Score: 21   End of Session Equipment Utilized During Treatment: Gait belt;Rolling walker Nurse Communication: Other (comment)(Informed  that pt was up in chair with posey alarm)  Activity Tolerance: Patient tolerated treatment well Patient left: in chair;with call bell/phone within reach;with chair alarm set;with nursing/sitter in room  OT Visit Diagnosis: Unsteadiness on feet (R26.81)                Time: [id number] OT Time Calculation (min): 46 min Charges:  OT General Charges $OT Visit: 1 Visit OT Evaluation $OT Eval Low Complexity: 1 Low OT Treatments $Self Care/Home Management : 38-52 mins  Baldomero Lamy, MS, OTR/L 04/05/19, 1:00 PM

## 2019-04-05 NOTE — Evaluation (Signed)
Speech Language Pathology Evaluation Patient Details Name: Keith Carter MRN: [id number] DOB: 1955/05/26 Today's Date: 04/05/2019 Time: [id number] SLP Time Calculation (min) (ACUTE ONLY): 50 min  Problem List:  Patient Active Problem List   Diagnosis Date Noted  . CVA (cerebral vascular accident) ([place]) 04/05/2019  . Stroke [facility]) 04/04/2019   Past Medical History:  Past Medical History:  Diagnosis Date  . Anxiety   . Enlarged prostate   . Hypertension   . TIA (transient ischemic attack)    Past Surgical History:  Past Surgical History:  Procedure Laterality Date  . BACK SURGERY    . HERNIA REPAIR     HPI:  Pt is a 64 year old pleasant male with history of Obesity, anxiety, stroke/TIA ~4 yrs ago per pt(not on antiplatelet therapy), HTN, HLD, GERD, BPH, Back Surgery w/ therapy ongoing, and Type II DM who presented  to the emergency department this evening on account of slurred speech. Onset of symptoms since 7 pm last night; noted it earlier in week. No preceding aura. Unfortunately, patient could not come in yesterday as he had to care for his Wife who had Parkinson's Dis. He denied any associated motor deficits.  He describes persistent symptoms of slurring of speech and sometimes stuttering. Denies any falls. He does reports previous episodes of slurred speech that resolves with time.  Today symptoms,have persisted.He denies headache. Denies any difficulty with eating or swallowing of liquids.  Per MRI, "acute infarction in the left posterior frontal lobe primarily within the subcortical white matter; pattern of atrophy and chronic small-vessel ischemic change throughout the brain".    Assessment / Plan / Recommendation Clinical Impression  Pt appears to present w/ Motor Speech deficits c/b Dysfluency and Mild+ Dysarthria. This impacts his verbal communication at the word-conversation levels. When pt utilized strategies of slowing rate of speech, pausing to take a deep breath(to relax) then  begin again, emphasizing speech sounds, and supporting 1-2 words w/ full breath support, pt's Dysfluency lessened and intelligibility increased. Practiced w/ pt looking for places in a sentence ~every 2-3 words to replenish breath support to complete the sentence/task w/ good articulation and clarity. Pt wanted to speak more softly but noted when he emphasized words w/ min increased volume, intelligibility greatly improved. Pt appeared to exhibit an awkward breath support pattern; min WOB w/ increased exertion. Discussed the importance of sitting fully upright to support diahpragmatic breathing and breath support for speech.  NO expressive or receptive aphasia noted, No cognitive deficits noted. OM exam was grossly wfl for lingual/labial strength/ROM/tone. Discussed Fluency Strategies w/ pt; practice and gave handouts. Recommend pt f/u w/ Outpatient or Home Health skilled ST servcies to continue working on the Dysfluency through strategies to improve communication in ADLs; education on reducing concerns of the Dysfluency. CM/NSG/MD updated. Pt agreed.     SLP Assessment  SLP Recommendation/Assessment: All further Speech Lanaguage Pathology  needs can be addressed in the next venue of care SLP Visit Diagnosis: Apraxia (R48.2);Dysarthria and anarthria (R47.1)((Dysfluency))    Follow Up Recommendations  Outpatient SLP;Home health SLP(TBD )    Frequency and Duration (n/a)  (n/a)      SLP Evaluation Cognition  Overall Cognitive Status: Within Functional Limits for tasks assessed Arousal/Alertness: Awake/alert Orientation Level: Oriented X4 Attention: Focused;Sustained Focused Attention: Appears intact Sustained Attention: Appears intact Memory: Appears intact Awareness: Appears intact Problem Solving: Appears intact Safety/Judgment: Appears intact       Comprehension  Auditory Comprehension Overall Auditory Comprehension: Appears within functional limits for tasks  assessed Yes/No Questions:  Within Functional Limits Commands: Within Functional Limits Conversation: Complex Interfering Components: Motor planning EffectiveTechniques: Pausing;Slowed speech;Stressing words Visual Recognition/Discrimination Discrimination: Not tested Reading Comprehension Reading Status: Not tested    Expression Expression Primary Mode of Expression: Verbal Verbal Expression Overall Verbal Expression: Appears within functional limits for tasks assessed Initiation: No impairment Automatic Speech: Name;Social Response;Counting;Day of week Level of Generative/Spontaneous Verbalization: Sentence Repetition: No impairment Naming: No impairment Pragmatics: No impairment Interfering Components: (Dysfluency) Non-Verbal Means of Communication: Not applicable Other Verbal Expression Comments: Dysfluency, Dysarthria Written Expression Dominant Hand: Right Written Expression: Not tested   Oral / Motor  Oral Motor/Sensory Function Overall Oral Motor/Sensory Function: Within functional limits Motor Speech Overall Motor Speech: Impaired Respiration: Impaired(also Obese and reclined in bed baseline) Level of Impairment: Word Phonation: Normal Resonance: Hyponasality(min) Articulation: Impaired Level of Impairment: Word(slight+) Intelligibility: Intelligibility reduced Word: 75-100% accurate Phrase: 75-100% accurate Sentence: 75-100% accurate Conversation: 75-100% accurate Motor Planning: Impaired Level of Impairment: Word Motor Speech Errors: Consistent;Aware Interfering Components: (n/a) Effective Techniques: Slow rate;Increased vocal intensity;Over-articulate;Pause(deep breath to relax then begin again)   GO                    Aiyannah Fayad 04/05/2019, 2:07 PM Orinda Kenner, [place], CCC-SLP

## 2019-04-05 NOTE — Evaluation (Addendum)
Clinical/Bedside Swallow Evaluation Patient Details  Name: Keith Carter MRN: [id number] Date of Birth: 06-17-1955  Today's Date: 04/05/2019 Time: SLP Start Time (ACUTE ONLY): 88 SLP Stop Time (ACUTE ONLY): 1000 SLP Time Calculation (min) (ACUTE ONLY): 40 min  Past Medical History:  Past Medical History:  Diagnosis Date  . Anxiety   . Enlarged prostate   . Hypertension   . TIA (transient ischemic attack)    Past Surgical History:  Past Surgical History:  Procedure Laterality Date  . BACK SURGERY    . HERNIA REPAIR     HPI:  Pt is a 64 year old pleasant male with history of Obesity, anxiety, stroke/TIA ~4 yrs ago per pt(not on antiplatelet therapy), HTN, HLD, GERD, BPH, Back Surgery w/ therapy ongoing, and Type II DM who presented  to the emergency department this evening on account of slurred speech. Onset of symptoms since 7 pm last night; noted it earlier in week. No preceding aura. Unfortunately, patient could not come in yesterday as he had to care for his Wife who had Parkinson's Dis. He denied any associated motor deficits.  He describes persistent symptoms of slurring of speech and sometimes stuttering. Denies any falls. He does reports previous episodes of slurred speech that resolves with time.  Today symptoms,have persisted.He denies headache. Denies any difficulty with eating or swallowing of liquids.  Per MRI, "acute infarction in the left posterior frontal lobe primarily within the subcortical white matter; pattern of atrophy and chronic small-vessel ischemic change throughout the brain".    Assessment / Plan / Recommendation Clinical Impression  Pt appears to present w/ adequate oropharyngeal phase swallowing function w/ no overt clinical s/s of aspiration noted. Pt appears at reduced risk for aspiration when following general aspiration precautions AND sitting fully upright w/ all oral intake. Pt does appears slightly SOB w/ any exertion; also noted larger abdominal area,  posture in bed. Increased pressure on the chest can impact respiratory effort and timing of the swallow; apnea moment. It is important to follow general aspiration precautions including eating/drinking slowly using rest breaks, and take small bites/sips. Must sit fully upright w/ all oral intake.  Pt consumed po trials w/ no overt s/s of aspiration noted; vocal quality dry/clear, no overt coughing or throat clearing. Encouraged rest breaks b/t trials to lessen any exertion, respiratory effort increase. Oral phase was [facility] for bolus management and oral clearing. Oral motor exam WFL. No gross unilateral weakness noted. Pt fed self w/ min setup support in bed.  Recommend continue w/ current diet as ordered w/ general aspiration precautions; pills in puree if needed for easier swallowing. Aspiration precautions discussed w/ pt and handout given. NSG to reconsult ST services if any needs while admitted. SLP Visit Diagnosis: Dysphagia, unspecified (R13.10)    Aspiration Risk  (reduced when following general precs)    Diet Recommendation  Regular diet w/ foods cut well, moistened; Thin liquids. General aspiration and Reflux precautions.  Medication Administration: Whole meds with liquid(or whole in puree if easier for swallowing)    Other  Recommendations Recommended Consults: (Dietician f/u) Oral Care Recommendations: Oral care BID;Patient independent with oral care Other Recommendations: (n/a)   Follow up Recommendations None      Frequency and Duration (n/a)  (n/a)       Prognosis Prognosis for Safe Diet Advancement: Good Barriers to Reach Goals: (n/a)      Swallow Study   General Date of Onset: 04/04/19 HPI: Pt is a 64 year old pleasant male with  history of Obesity, anxiety, stroke/TIA ~4 yrs ago per pt(not on antiplatelet therapy), HTN, HLD, GERD, BPH, Back Surgery w/ therapy ongoing, and Type II DM who presented  to the emergency department this evening on account of slurred speech.  Onset of symptoms since 7 pm last night; noted it earlier in week. No preceding aura. Unfortunately, patient could not come in yesterday as he had to care for his Wife who had Parkinson's Dis. He denied any associated motor deficits.  He describes persistent symptoms of slurring of speech and sometimes stuttering. Denies any falls. He does reports previous episodes of slurred speech that resolves with time.  Today symptoms,have persisted.He denies headache. Denies any difficulty with eating or swallowing of liquids.  Per MRI, "acute infarction in the left posterior frontal lobe primarily within the subcortical white matter; pattern of atrophy and chronic small-vessel ischemic change throughout the brain".  Type of Study: Bedside Swallow Evaluation Previous Swallow Assessment: none Diet Prior to this Study: Regular;Thin liquids Temperature Spikes Noted: No(wbc 7.2) Respiratory Status: Room air History of Recent Intubation: No Behavior/Cognition: Alert;Cooperative;Pleasant mood Oral Cavity Assessment: Within Functional Limits Oral Care Completed by SLP: Yes Oral Cavity - Dentition: Adequate natural dentition Vision: Functional for self-feeding Self-Feeding Abilities: Able to feed self;Needs set up(positioning) Patient Positioning: Upright in bed(needed min support) Baseline Vocal Quality: Normal;Low vocal intensity(slight) Volitional Cough: Strong Volitional Swallow: Able to elicit    Oral/Motor/Sensory Function Overall Oral Motor/Sensory Function: Within functional limits   Ice Chips Ice chips: Not tested   Thin Liquid Thin Liquid: Within functional limits Presentation: Self Fed;Straw(several trials - ~2-3 ozs)    Nectar Thick Nectar Thick Liquid: Not tested   Honey Thick Honey Thick Liquid: Not tested   Puree Puree: Not tested   Solid     Solid: Within functional limits Presentation: Self Fed;Spoon(4-5 trials)       Orinda Kenner, MS, CCC-SLP Nico Rogness 04/05/2019,11:40  AM

## 2019-04-05 NOTE — Care Management CC44 (Signed)
Condition Code 44 Documentation Completed  Patient Details  Name: Keith Carter MRN: [id number] Date of Birth: July 19, 1955   Condition Code 44 given:  Yes Patient signature on Condition Code 44 notice:  Yes Documentation of 2 MD's agreement:  Yes Code 44 added to claim:  Yes    Keith Scale [name], LCSW 04/05/2019, 2:27 PM

## 2019-04-05 NOTE — Evaluation (Signed)
Physical Therapy Evaluation Patient Details Name: Keith Carter MRN: [id number] DOB: [birth year] Today's Date: 04/05/2019   History of Present Illness  Keith Carter is an 64 y.o. male with a history of TIA and HTN who was at home on the evening of 3/3 and had the acute onset of difficulty with speech. There has been some improvement in his speech but he is not back to baseline. MRI found acute infarct.  Clinical Impression  Pt did well with PT exam and though he needed RW during ambulation (SPC at baseline) he generally reports feeling close to his baseline regarding mobility/strength/coordination/etc.  PT unsure of baseline re: speech, but this continues to be his biggest acute issue, though with some extra time he is able to fully communicate all-be-it with stuttering/choppy speech.  Overall pt did well showing some minimal balance issues but safe and appropriate mobility with UE support. Pt able to do ~350 ft of gait training, but HR did creep up to the 130s by the end of the effort and he did have some subjective fatigue.  Overall pt is safe to return home, discussed outpt PT to address more specific balance and gait activities vs return to his 4-5x/week pool exercises that he has done for years. These are not mutually exclusive but pt will think on what best serves him and his situation.      Follow Up Recommendations Outpatient PT    Equipment Recommendations  None recommended by PT    Recommendations for Other Services       Precautions / Restrictions Precautions Precautions: Fall Restrictions Weight Bearing Restrictions: No      Mobility  Bed Mobility Overal bed mobility: Modified Independent Bed Mobility: Sit to Supine     Supine to sit: HOB elevated;Supervision Sit to supine: Supervision   General bed mobility comments: Pt was able to get LEs up into bed, body habitus seemed more a limiter than LE strength  Transfers Overall transfer level: Needs assistance Equipment used:  Rolling walker (2 wheeled);None Transfers: Sit to/from Stand Sit to Stand: Min guard Stand pivot transfers: Min guard       General transfer comment: multiple sit to stand efforts, pt clearly more stable with RW but able to statically maintain balance w/o UEs at EOB  Ambulation/Gait Ambulation/Gait assistance: Supervision Gait Distance (Feet): 350 Feet Assistive device: Rolling walker (2 wheeled)       General Gait Details: Pt able to maintain consistent and safe cadence with walker.  He had no LOBs and reported feeling reasonably close to his baseline (though he only uses SPC at baseline).  Pt's O2 remained in the high 90s t/o the effort - HR crept upward up 110s, 120s and then reached 130s  Stairs            Wheelchair Mobility    Modified Rankin (Stroke Patients Only)       Balance Overall balance assessment: Modified Independent                                           Pertinent Vitals/Pain Pain Assessment: No/denies pain    Home Living Family/patient expects to be discharged to:: Private residence Living Arrangements: Spouse/significant other;Children(wife has Parkinsons and he is her caregiver) Available Help at Discharge: (does have neighbors, daughter and friends than can help) Type of Home: House Home Access: Ramped entrance     Home Layout:  One level Home Equipment: Cane - single point;Grab bars - tub/shower;Grab bars - toilet;Walker - 2 wheels Additional Comments: Patient states he has access to equipment d/t his wife, but he only utilizes SPC.    Prior Function Level of Independence: Independent         Comments: Patient states he was MOD I with all (B/I)ADLs and is his wife's primary caregiver.  Patient reports using [facility] for functional mobility due to back pain.     Hand Dominance   Dominant Hand: Right    Extremity/Trunk Assessment   Upper Extremity Assessment Upper Extremity Assessment: Overall WFL for tasks  assessed    Lower Extremity Assessment Lower Extremity Assessment: Overall WFL for tasks assessed    Cervical / Trunk Assessment Cervical / Trunk Assessment: Normal  Communication   Communication: Other (comment);No difficulties(new onset slurred, terse speech but able to communicate well)  Cognition Arousal/Alertness: Awake/alert Behavior During Therapy: WFL for tasks assessed/performed Overall Cognitive Status: Within Functional Limits for tasks assessed                                 General Comments: A&Ox4. Pleasant and cooperative.      General Comments General comments (skin integrity, edema, etc.): Pt reports that other than his speech he feels close to his baseline.  He clearly needed UEs during ambulation, encouraged him to use RW when he initially goes home ([place] at baseline)    Exercises Other Exercises Other Exercises: Performed bed mobility to EOB to complete dressing.  Required CGA and extra time in sitting and standing.  Completed toileting with CGA for all tasks using RW for functional mobility. Other Exercises: Educated patient on general goals of OT within acute setting Other Exercises: Educated patient on body mechanics/sequencing/hand placement during functional transfers Other Exercises: Educated patient on use of compensatory techniques to safely perform ADLs   Assessment/Plan    PT Assessment Patient needs continued PT services  PT Problem List Decreased strength;Decreased range of motion;Decreased activity tolerance;Decreased balance;Decreased mobility;Decreased coordination;Decreased knowledge of use of DME;Decreased safety awareness;Cardiopulmonary status limiting activity       PT Treatment Interventions Gait training;DME instruction;Stair training;Functional mobility training;Therapeutic exercise;Therapeutic activities;Balance training;Neuromuscular re-education;Patient/family education    PT Goals (Current goals can be found in the Care  Plan section)  Acute Rehab PT Goals Patient Stated Goal: get back to his normal exercise routine PT Goal Formulation: With patient Time For Goal Achievement: 04/19/19 Potential to Achieve Goals: Good    Frequency 7X/week   Barriers to discharge        Co-evaluation               AM-PAC PT "6 Clicks" Mobility  Outcome Measure Help needed turning from your back to your side while in a flat bed without using bedrails?: None Help needed moving from lying on your back to sitting on the side of a flat bed without using bedrails?: None Help needed moving to and from a bed to a chair (including a wheelchair)?: A Little Help needed standing up from a chair using your arms (e.g., wheelchair or bedside chair)?: A Little Help needed to walk in hospital room?: A Little Help needed climbing 3-5 steps with a railing? : A Little 6 Click Score: 20    End of Session Equipment Utilized During Treatment: Gait belt Activity Tolerance: Patient limited by fatigue;Patient tolerated treatment well Patient left: with bed alarm set;with call bell/phone within  reach Nurse Communication: Mobility status(pt had ?s about getting home meds) PT Visit Diagnosis: Unsteadiness on feet (R26.81);Other symptoms and signs involving the nervous system (R29.898)    Time: [id number] PT Time Calculation (min) (ACUTE ONLY): 26 min   Charges:   PT Evaluation $PT Eval Low Complexity: 1 Low PT Treatments $Gait Training: 8-22 mins        Kreg Shropshire, DPT 04/05/2019, 1:35 PM

## 2019-04-05 NOTE — Discharge Instructions (Addendum)
Plavix daily for 21 days then stop Aspirin lifelong daily  Recommend outpatient sleep study

## 2019-04-05 NOTE — Progress Notes (Signed)
*  PRELIMINARY RESULTS* Echocardiogram 2D Echocardiogram has been performed.  Keith Carter Keith Carter Keith Carter Keith Carter 04/05/2019, 1:05 PM

## 2019-04-05 NOTE — Care Management Obs Status (Signed)
[place] NOTIFICATION   Patient Details  Name: Keith Carter MRN: [id number] Date of Birth: 1955/09/10   Medicare Observation Status Notification Given:  Yes    Vina Byrd A Aiyla Baucom, LCSW 04/05/2019, 2:27 PM

## 2019-04-05 NOTE — Progress Notes (Signed)
Allan Blystone to be D/C'd Home per MD order. Patient given discharge teaching and paperwork regarding medications, diet, follow-up appointments and activity. Patient understanding verbalized. No questions or complaints at this time. Skin condition as charted. IV and telemetry removed prior to leaving.  No further needs by Care Management/Social Work. Prescriptions e-prescribed by MD.  An After Visit Summary was printed and given to the patient.   Patient escorted via wheelchair by MD  Terrilyn Saver

## 2019-04-19 ENCOUNTER — Telehealth: Payer: Self-pay | Admitting: Family

## 2019-04-19 ENCOUNTER — Encounter: Payer: Self-pay | Admitting: Family

## 2019-04-19 ENCOUNTER — Other Ambulatory Visit: Payer: Self-pay

## 2019-04-19 ENCOUNTER — Ambulatory Visit (INDEPENDENT_AMBULATORY_CARE_PROVIDER_SITE_OTHER): Payer: Medicare HMO

## 2019-04-19 ENCOUNTER — Ambulatory Visit (INDEPENDENT_AMBULATORY_CARE_PROVIDER_SITE_OTHER): Payer: Medicare HMO | Admitting: Family

## 2019-04-19 VITALS — BP 110/70 | HR 90 | Ht 71.0 in | Wt 271.5 lb

## 2019-04-19 DIAGNOSIS — I10 Essential (primary) hypertension: Secondary | ICD-10-CM | POA: Diagnosis not present

## 2019-04-19 DIAGNOSIS — I639 Cerebral infarction, unspecified: Secondary | ICD-10-CM

## 2019-04-19 DIAGNOSIS — E1169 Type 2 diabetes mellitus with other specified complication: Secondary | ICD-10-CM

## 2019-04-19 DIAGNOSIS — E785 Hyperlipidemia, unspecified: Secondary | ICD-10-CM

## 2019-04-19 NOTE — Patient Instructions (Signed)
Medication Instructions:  Your physician recommends that you continue on your current medications as directed. Please refer to the Current Medication list given to you today.  *If you need a refill on your cardiac medications before your next appointment, please call your pharmacy*   Lab Work: None If you have labs (blood work) drawn today and your tests are completely normal, you will receive your results only by: Marland Kitchen MyChart Message (if you have MyChart) OR . A paper copy in the mail If you have any lab test that is abnormal or we need to change your treatment, we will call you to review the results.   Testing/Procedures: Your physician has recommended that you wear a Zio monitor. This monitor is a medical device that records the heart's electrical activity. Doctors most often use these monitors to diagnose arrhythmias. Arrhythmias are problems with the speed or rhythm of the heartbeat. The monitor is a small device applied to your chest. You can wear one while you do your normal daily activities. While wearing this monitor if you have any symptoms to push the button and record what you felt. Once you have worn this monitor for the period of time provider prescribed (Usually 14 days), you will return the monitor device in the postage paid box. Once it is returned they will download the data collected and provide [place] with a report which the provider will then review and we will call you with those results. Important tips:  1. Avoid showering during the first 24 hours of wearing the monitor. 2. Avoid excessive sweating to help maximize wear time. 3. Do not submerge the device, no hot tubs, and no swimming pools. 4. Keep any lotions or oils away from the patch. 5. After 24 hours you may shower with the patch on. Take brief showers with your back facing the shower head.  6. Do not remove patch once it has been placed because that will interrupt data and decrease adhesive wear time. 7. Push the  button when you have any symptoms and write down what you were feeling. 8. Once you have completed wearing your monitor, remove and place into box which has postage paid and place in your outgoing mailbox.  9. If for some reason you have misplaced your box then call our office and we can provide another box and/or mail it off for you.         Follow-Up: At [facility], [facility], [facility], you and your health needs are our priority.  As part of our continuing mission to provide you with exceptional heart care, we have created designated Provider Care Teams.  These Care Teams include your primary Cardiologist (physician) and Advanced Practice Providers (APPs -  Physician Assistants and Nurse Practitioners) who all work together to provide you with the care you need, when you need it.  We recommend signing up for the patient portal called "MyChart".  Sign up information is provided on this After Visit Summary.  MyChart is used to connect with patients for Virtual Visits (Telemedicine).  Patients are able to view lab/test results, encounter notes, upcoming appointments, etc.  Non-urgent messages can be sent to your provider as well.   To learn more about what you can do with MyChart, go to [email or web address].    Your next appointment:   6 month(s)  The format for your next appointment:   In Person  Provider:   Needs to be seen by one of the Dr's. Not an APP.    Other Instructions

## 2019-04-19 NOTE — Progress Notes (Signed)
Office Visit    Patient Name: Keith Carter Date of Encounter: 04/19/2019  Primary Care Provider:  [name], NP Primary Cardiologist:  New to [facility] - to establish with MD at next appointment Electrophysiologist:  None   Chief Complaint    Keith Carter is a 64 y.o. male with a hx of CVA, DM2, HTN, obesity, BPH presents today for hospital follow up for stroke   Past Medical History    Past Medical History:  Diagnosis Date  . Anxiety   . Enlarged prostate   . Hypertension   . TIA (transient ischemic attack)    Past Surgical History:  Procedure Laterality Date  . BACK SURGERY    . HERNIA REPAIR      Allergies  No Known Allergies  History of Present Illness    [name] is a 64 y.o. male with a hx of CVA, DM2, HTN, obesity, BPH. He has never been seen by [facility]. Tells me he had a cardiac cath many years ago in [place] and was told he has "big open arteries".   Admitted 04/04/19-04/05/19 to [facility] after presenting with stroke. Imaging showed acute CVA involving left frontal lobe with dysarthria. He was outside of the window for TPA. Neurology recommended aspirin 81mg  and Plavix 75mg  daily x21 days. It was thought that acute infarct was secondary to small vessel disease. Lipitor increased to 40mg  daily. Carotid duplex with minimal plaque. Echo with bubble study 04/05/19 with LVEF 60-65%, no RWMA, normal diastolic parameters, RV normal size and function, mild dilation of aortic root 49mmHg, mildly dilated pulmonary artery. Bubble study limited by poor windows - small shunt could not be entirely excluded.   Present today with his wife. He reports feeling well. Tells me his speech is nearly back to his baseline and is appreciative of the therapies he received in the hospital. Reports no residual weakness or numbness.  No chest pain, pressure, tightness. No shortness of breath nor dyspnea on exertion. No palpitations, orthopnea, PND. Will get intermittent LE edema at the  end of the day that is relieved by elevating his legs, we discussed likely etiology dependent edema/venous insufficiency.   We reviewed his echocardiogram in detail. Reviewed potential causes of stroke including small vessel disease noted by MRI or arrhythmia.   Endorses eating a heart healthy diet. Checks blood pressure regularly at home with readings 120s/80s. He swims 5 days per week for exercise without difficulty. He does have a history of back injury and surgery which makes alternative exercise difficult.   EKGs/Labs/Other Studies Reviewed:   The following studies were reviewed today:  Carotid duplex 04/05/19 IMPRESSION: 1. Minimal to moderate amount of left-sided atherosclerotic plaque, not resulting in a hemodynamically significant stenosis. 2. Minimal amount of right-sided intimal thickening, not resulting in a hemodynamically significant stenosis.  Echo with bubble study 04/05/19   1. Left ventricular ejection fraction, by estimation, is 60 to 65%. The  left ventricle has normal function. The left ventricle has no regional  wall motion abnormalities. Left ventricular diastolic parameters were  normal.   2. Right ventricular systolic function was not well visualized. The right  ventricular size is not well visualized. Tricuspid regurgitation signal is  inadequate for assessing PA pressure.   3. The mitral valve was not well visualized. No evidence of mitral valve  regurgitation. No evidence of mitral stenosis.   4. The aortic valve is tricuspid. Aortic valve regurgitation is not  visualized. There is no aortic valve stenosis.  5. Aortic dilatation noted. There is mild dilatation of the aortic root  measuring 38 mm.   6. Mildly dilated pulmonary artery.   7. Bubble study is limited by poor windows. There does not appear to be a  large intracardiac shunt, though a small shunt cannot be entirely  excluded.   EKG:  EKG is ordered today.  The ekg ordered today demonstrates NSR  90 bpm with RBBB.   Recent Labs: 04/04/2019: ALT 16 04/05/2019: BUN 20; Creatinine, Ser 0.81; Hemoglobin 13.6; Platelets 237; Potassium 4.0; Sodium 139  Recent Lipid Panel    Component Value Date/Time   CHOL 97 04/05/2019 0725   TRIG 67 04/05/2019 0725   HDL 54 04/05/2019 0725   CHOLHDL 1.8 04/05/2019 0725   VLDL 13 04/05/2019 0725   LDLCALC 30 04/05/2019 0725    Home Medications   Current Meds  Medication Sig  . acetaminophen (TYLENOL) 500 MG tablet Take 500 mg by mouth every 6 (six) hours as needed.   Marland Kitchen aspirin EC 81 MG tablet Take 1 tablet (81 mg total) by mouth daily.  Marland Kitchen atorvastatin (LIPITOR) 40 MG tablet Take 1 tablet (40 mg total) by mouth daily at 6 PM.  . Cholecalciferol 25 MCG (1000 UT) tablet Take 5,000 Units by mouth daily.   . clopidogrel (PLAVIX) 75 MG tablet Take 1 tablet (75 mg total) by mouth daily.  Marland Kitchen lisinopril (ZESTRIL) 40 MG tablet Take 40 mg by mouth at bedtime.   . metFORMIN (GLUCOPHAGE-XR) 500 MG 24 hr tablet Take 500 mg by mouth 2 (two) times daily.  Marland Kitchen PARoxetine (PAXIL) 40 MG tablet Take 40 mg by mouth at bedtime.   . Potassium 99 MG TABS Take by mouth daily.  . sildenafil (REVATIO) 20 MG tablet Take 20 mg by mouth daily as needed.  . tamsulosin (FLOMAX) 0.4 MG CAPS capsule Take 0.8 mg by mouth at bedtime.     Review of Systems    Review of Systems  Constitution: Negative for chills, fever and malaise/fatigue.  Cardiovascular: Negative for chest pain, dyspnea on exertion, leg swelling, near-syncope, orthopnea, palpitations and syncope.  Respiratory: Negative for cough, shortness of breath and wheezing.   Gastrointestinal: Negative for nausea and vomiting.  Neurological: Negative for dizziness, light-headedness and weakness.   All other systems reviewed and are otherwise negative except as noted above.  Physical Exam    VS:  BP 110/70 (BP Location: Right Arm, Patient Position: Sitting, Cuff Size: Large)   Pulse 90   Ht 5\' 11"  (1.803 m)   Wt 271 lb  8 oz (123.2 kg)   SpO2 96%   BMI 37.87 kg/m  , BMI Body mass index is 37.87 kg/m. GEN: Well nourished, overweight, well developed, in no acute distress. HEENT: normal. Neck: Supple, no JVD, carotid bruits, or masses. Cardiac: RRR, no murmurs, rubs, or gallops. No clubbing, cyanosis, edema.  Radials/PT 2+ and equal bilaterally.  Respiratory:  Respirations regular and unlabored, clear to auscultation bilaterally. GI: Soft, nontender, nondistended, BS + x 4. MS: No deformity or atrophy. Skin: Warm and dry, no rash. Neuro:  Strength and sensation are intact. Psych: Normal affect.  Assessment & Plan    1. CVA - Recent admission with acute infarct presumed due to small vessel disease per neurology. Carotid duplex with no significant disease. Echo Bubble study with normal LVEF, no significant valvular disease, mild dilation of aortic root 61mm, unable to exclude small shunt entirely due to poor windows.   ZIO placed today to rule  out arrhythmia as cause of CVA. Low suspicion as reports no palpitations, irregular heart beats and noted small vessel disease on brain MRI while hospitalized.   Continue Plavix x21 days then discontinue per neurology. Remain on Aspirin 81mg  daily.   Continue optimal BP and lipid control, as below.   2. HTN - BP well controlled. Monitors carefully at home. Continue management per primary care with Lisinopril 40mg  daily.   3. Mild aortic root dilation - Noted by echo 04/2019 measuring 45mm. Recommend optimal BP control. Consider repeat study for monitoring in 1-2 years.   4. HLD - 04/05/19 LDL 30. Continue Atorvastatin 40mg  daily.   Disposition: Follow up in 6 month(s)   Loel Dubonnet, NP 04/19/2019, 2:37 PM

## 2019-04-19 NOTE — Telephone Encounter (Signed)
Error

## 2019-05-09 DIAGNOSIS — Z8673 Personal history of transient ischemic attack (TIA), and cerebral infarction without residual deficits: Secondary | ICD-10-CM | POA: Insufficient documentation

## 2019-05-10 ENCOUNTER — Telehealth: Payer: Self-pay | Admitting: *Deleted

## 2019-05-10 NOTE — Telephone Encounter (Signed)
No answer. Left message to call back.   

## 2019-05-10 NOTE — Telephone Encounter (Signed)
-----   Message from Loel Dubonnet, NP sent at 05/10/2019  8:18 AM EDT ----- Monitor showed predominantly NSR (normal heart rhythm). He had rare early beats in the top bottom chambers of his heart which are not dangerous. He had 3 short runs of fast heart rates that are again, not dangerous. Very reassuring results. No signs of arrhythmias that would have caused his recent stroke.

## 2019-05-10 NOTE — Telephone Encounter (Signed)
Results called to pt. Pt verbalized understanding.  

## 2019-09-28 ENCOUNTER — Emergency Department (HOSPITAL_COMMUNITY): Payer: Medicare HMO

## 2019-09-28 ENCOUNTER — Observation Stay (HOSPITAL_COMMUNITY): Payer: Medicare HMO

## 2019-09-28 ENCOUNTER — Inpatient Hospital Stay (HOSPITAL_COMMUNITY)
Admission: EM | Admit: 2019-09-28 | Discharge: 2019-10-03 | DRG: 069 | Disposition: A | Discharge status: Skilled Nursing Facility | Payer: Medicare HMO | Attending: Internal Medicine | Admitting: Internal Medicine

## 2019-09-28 ENCOUNTER — Encounter (HOSPITAL_COMMUNITY): Payer: Self-pay | Admitting: Internal Medicine

## 2019-09-28 ENCOUNTER — Observation Stay (HOSPITAL_BASED_OUTPATIENT_CLINIC_OR_DEPARTMENT_OTHER): Payer: Medicare HMO

## 2019-09-28 DIAGNOSIS — I69392 Facial weakness following cerebral infarction: Secondary | ICD-10-CM

## 2019-09-28 DIAGNOSIS — R410 Disorientation, unspecified: Secondary | ICD-10-CM

## 2019-09-28 DIAGNOSIS — Z6837 Body mass index (BMI) 37.0-37.9, adult: Secondary | ICD-10-CM

## 2019-09-28 DIAGNOSIS — E669 Obesity, unspecified: Secondary | ICD-10-CM | POA: Diagnosis not present

## 2019-09-28 DIAGNOSIS — I10 Essential (primary) hypertension: Secondary | ICD-10-CM | POA: Diagnosis present

## 2019-09-28 DIAGNOSIS — I69320 Aphasia following cerebral infarction: Secondary | ICD-10-CM

## 2019-09-28 DIAGNOSIS — Z79899 Other long term (current) drug therapy: Secondary | ICD-10-CM

## 2019-09-28 DIAGNOSIS — R17 Unspecified jaundice: Secondary | ICD-10-CM | POA: Diagnosis present

## 2019-09-28 DIAGNOSIS — E785 Hyperlipidemia, unspecified: Secondary | ICD-10-CM | POA: Diagnosis present

## 2019-09-28 DIAGNOSIS — I6389 Other cerebral infarction: Secondary | ICD-10-CM

## 2019-09-28 DIAGNOSIS — R4701 Aphasia: Secondary | ICD-10-CM | POA: Diagnosis present

## 2019-09-28 DIAGNOSIS — R471 Dysarthria and anarthria: Secondary | ICD-10-CM | POA: Diagnosis present

## 2019-09-28 DIAGNOSIS — Z7902 Long term (current) use of antithrombotics/antiplatelets: Secondary | ICD-10-CM

## 2019-09-28 DIAGNOSIS — I69311 Memory deficit following cerebral infarction: Secondary | ICD-10-CM

## 2019-09-28 DIAGNOSIS — E876 Hypokalemia: Secondary | ICD-10-CM | POA: Diagnosis not present

## 2019-09-28 DIAGNOSIS — Z66 Do not resuscitate: Secondary | ICD-10-CM | POA: Diagnosis present

## 2019-09-28 DIAGNOSIS — E041 Nontoxic single thyroid nodule: Secondary | ICD-10-CM | POA: Diagnosis present

## 2019-09-28 DIAGNOSIS — Z20822 Contact with and (suspected) exposure to covid-19: Secondary | ICD-10-CM | POA: Diagnosis present

## 2019-09-28 DIAGNOSIS — Z7982 Long term (current) use of aspirin: Secondary | ICD-10-CM

## 2019-09-28 DIAGNOSIS — G459 Transient cerebral ischemic attack, unspecified: Principal | ICD-10-CM | POA: Diagnosis present

## 2019-09-28 DIAGNOSIS — N400 Enlarged prostate without lower urinary tract symptoms: Secondary | ICD-10-CM | POA: Diagnosis present

## 2019-09-28 DIAGNOSIS — R569 Unspecified convulsions: Secondary | ICD-10-CM | POA: Diagnosis present

## 2019-09-28 DIAGNOSIS — Z7984 Long term (current) use of oral hypoglycemic drugs: Secondary | ICD-10-CM

## 2019-09-28 DIAGNOSIS — E1169 Type 2 diabetes mellitus with other specified complication: Secondary | ICD-10-CM | POA: Diagnosis present

## 2019-09-28 DIAGNOSIS — F419 Anxiety disorder, unspecified: Secondary | ICD-10-CM | POA: Diagnosis present

## 2019-09-28 DIAGNOSIS — Z791 Long term (current) use of non-steroidal anti-inflammatories (NSAID): Secondary | ICD-10-CM

## 2019-09-28 HISTORY — DX: Prediabetes: R73.03

## 2019-09-28 HISTORY — DX: Hyperlipidemia, unspecified: E78.5

## 2019-09-28 LAB — CBC
HCT: 46 % (ref 39.0–52.0)
Hemoglobin: 14.6 g/dL (ref 13.0–17.0)
MCH: 29.5 pg (ref 26.0–34.0)
MCHC: 31.7 g/dL (ref 30.0–36.0)
MCV: 92.9 fL (ref 80.0–100.0)
Platelets: 235 10*3/uL (ref 150–400)
RBC: 4.95 MIL/uL (ref 4.22–5.81)
RDW: 14.4 % (ref 11.5–15.5)
WBC: 7.4 10*3/uL (ref 4.0–10.5)
nRBC: 0 % (ref 0.0–0.2)

## 2019-09-28 LAB — I-STAT CHEM 8, ED
BUN: 18 mg/dL (ref 8–23)
Calcium, Ion: 1.22 mmol/L (ref 1.15–1.40)
Chloride: 108 mmol/L (ref 98–111)
Creatinine, Ser: 1 mg/dL (ref 0.61–1.24)
Glucose, Bld: 158 mg/dL — ABNORMAL HIGH (ref 70–99)
HCT: 44 % (ref 39.0–52.0)
Hemoglobin: 15 g/dL (ref 13.0–17.0)
Potassium: 4.1 mmol/L (ref 3.5–5.1)
Sodium: 142 mmol/L (ref 135–145)
TCO2: 22 mmol/L (ref 22–32)

## 2019-09-28 LAB — HEMOGLOBIN A1C
Hgb A1c MFr Bld: 7.1 % — ABNORMAL HIGH (ref 4.8–5.6)
Mean Plasma Glucose: 157.07 mg/dL

## 2019-09-28 LAB — COMPREHENSIVE METABOLIC PANEL
ALT: 15 U/L (ref 0–44)
AST: 17 U/L (ref 15–41)
Albumin: 3.4 g/dL — ABNORMAL LOW (ref 3.5–5.0)
Alkaline Phosphatase: 73 U/L (ref 38–126)
Anion gap: 9 (ref 5–15)
BUN: 15 mg/dL (ref 8–23)
CO2: 23 mmol/L (ref 22–32)
Calcium: 9.1 mg/dL (ref 8.9–10.3)
Chloride: 109 mmol/L (ref 98–111)
Creatinine, Ser: 1.18 mg/dL (ref 0.61–1.24)
GFR calc Af Amer: >60 mL/min (ref >60)
GFR calc non Af Amer: >60 mL/min (ref >60)
Glucose, Bld: 165 mg/dL — ABNORMAL HIGH (ref 70–99)
Potassium: 4.1 mmol/L (ref 3.5–5.1)
Sodium: 141 mmol/L (ref 135–145)
Total Bilirubin: 0.9 mg/dL (ref 0.3–1.2)
Total Protein: 6.4 g/dL — ABNORMAL LOW (ref 6.5–8.1)

## 2019-09-28 LAB — DIFFERENTIAL
Abs Immature Granulocytes: 0.05 10*3/uL (ref 0.00–0.07)
Basophils Absolute: 0.1 10*3/uL (ref 0.0–0.1)
Basophils Relative: 1 %
Eosinophils Absolute: 0.3 10*3/uL (ref 0.0–0.5)
Eosinophils Relative: 4 %
Immature Granulocytes: 1 %
Lymphocytes Relative: 21 %
Lymphs Abs: 1.5 10*3/uL (ref 0.7–4.0)
Monocytes Absolute: 0.6 10*3/uL (ref 0.1–1.0)
Monocytes Relative: 8 %
Neutro Abs: 4.9 10*3/uL (ref 1.7–7.7)
Neutrophils Relative %: 65 %

## 2019-09-28 LAB — ECHOCARDIOGRAM COMPLETE
Area-P 1/2: 4.68 cm2
Calc EF: 49.3 %
S' Lateral: 3.6 cm
Single Plane A2C EF: 50.5 %
Single Plane A4C EF: 46.9 %

## 2019-09-28 LAB — SARS CORONAVIRUS 2 BY RT PCR (HOSPITAL ORDER, PERFORMED IN ~~LOC~~ HOSPITAL LAB): SARS Coronavirus 2: NEGATIVE

## 2019-09-28 LAB — RAPID URINE DRUG SCREEN, HOSP PERFORMED
Amphetamines: NOT DETECTED
Barbiturates: NOT DETECTED
Benzodiazepines: NOT DETECTED
Cocaine: NOT DETECTED
Opiates: NOT DETECTED
Tetrahydrocannabinol: NOT DETECTED

## 2019-09-28 LAB — CBG MONITORING, ED
Glucose-Capillary: 158 mg/dL — ABNORMAL HIGH (ref 70–99)
Glucose-Capillary: 105 mg/dL — ABNORMAL HIGH (ref 70–99)

## 2019-09-28 LAB — GLUCOSE, CAPILLARY: Glucose-Capillary: 115 mg/dL — ABNORMAL HIGH (ref 70–99)

## 2019-09-28 LAB — TSH: TSH: 0.746 u[IU]/mL (ref 0.350–4.500)

## 2019-09-28 LAB — APTT: aPTT: 32 seconds (ref 24–36)

## 2019-09-28 LAB — PROTIME-INR
INR: 1 (ref 0.8–1.2)
Prothrombin Time: 13 seconds (ref 11.4–15.2)

## 2019-09-28 MED ORDER — SENNOSIDES-DOCUSATE SODIUM 8.6-50 MG PO TABS: 1.0000 | ORAL_TABLET | Freq: Every evening | ORAL | Status: DC | PRN

## 2019-09-28 MED ORDER — ACETAMINOPHEN 160 MG/5ML PO SOLN: 650.0000 mg | ORAL | Status: DC | PRN

## 2019-09-28 MED ORDER — PAROXETINE HCL 20 MG PO TABS
40.0000 mg | ORAL_TABLET | Freq: Every day | ORAL | Status: DC
  Administered 2019-09-28 – 2019-10-02 (×5): 40 mg via ORAL
  Filled 2019-09-28 (×5): qty 2

## 2019-09-28 MED ORDER — INSULIN ASPART 100 UNIT/ML ~~LOC~~ SOLN
0.0000 [IU] | Freq: Every day | SUBCUTANEOUS | Status: DC
  Administered 2019-09-29 – 2019-10-01 (×2): 2 [IU] via SUBCUTANEOUS

## 2019-09-28 MED ORDER — ONDANSETRON HCL 4 MG/2ML IJ SOLN: 4.0000 mg | Freq: Four times a day (QID) | INTRAMUSCULAR | Status: DC | PRN

## 2019-09-28 MED ORDER — TAMSULOSIN HCL 0.4 MG PO CAPS
0.8000 mg | ORAL_CAPSULE | Freq: Every day | ORAL | Status: DC
  Administered 2019-09-28 – 2019-10-02 (×5): 0.8 mg via ORAL
  Filled 2019-09-28 (×5): qty 2

## 2019-09-28 MED ORDER — ASPIRIN EC 81 MG PO TBEC
81.0000 mg | DELAYED_RELEASE_TABLET | Freq: Every day | ORAL | Status: DC
  Administered 2019-09-28 – 2019-09-30 (×3): 81 mg via ORAL
  Filled 2019-09-28 (×3): qty 1

## 2019-09-28 MED ORDER — SODIUM CHLORIDE 0.9% FLUSH
3.0000 mL | Freq: Once | INTRAVENOUS | Status: AC
  Administered 2019-09-28: 3 mL via INTRAVENOUS

## 2019-09-28 MED ORDER — ACETAMINOPHEN 325 MG PO TABS: 650.0000 mg | ORAL_TABLET | ORAL | Status: DC | PRN

## 2019-09-28 MED ORDER — CELECOXIB 200 MG PO CAPS
200.0000 mg | ORAL_CAPSULE | Freq: Every day | ORAL | Status: DC
  Filled 2019-09-28: qty 1

## 2019-09-28 MED ORDER — ENOXAPARIN SODIUM 40 MG/0.4ML ~~LOC~~ SOLN
40.0000 mg | SUBCUTANEOUS | Status: DC
  Administered 2019-09-28 – 2019-10-03 (×6): 40 mg via SUBCUTANEOUS
  Filled 2019-09-28 (×6): qty 0.4

## 2019-09-28 MED ORDER — PERFLUTREN LIPID MICROSPHERE
1.0000 mL | INTRAVENOUS | Status: AC | PRN
  Administered 2019-09-28: 2 mL via INTRAVENOUS
  Filled 2019-09-28: qty 10

## 2019-09-28 MED ORDER — INSULIN ASPART 100 UNIT/ML ~~LOC~~ SOLN
0.0000 [IU] | Freq: Three times a day (TID) | SUBCUTANEOUS | Status: DC
  Administered 2019-09-29: 3 [IU] via SUBCUTANEOUS
  Administered 2019-09-29: 2 [IU] via SUBCUTANEOUS
  Administered 2019-09-30: 5 [IU] via SUBCUTANEOUS
  Administered 2019-09-30 – 2019-10-01 (×2): 3 [IU] via SUBCUTANEOUS
  Administered 2019-10-01 – 2019-10-02 (×2): 2 [IU] via SUBCUTANEOUS
  Administered 2019-10-02: 3 [IU] via SUBCUTANEOUS
  Administered 2019-10-02: 5 [IU] via SUBCUTANEOUS
  Administered 2019-10-03 (×2): 3 [IU] via SUBCUTANEOUS
  Administered 2019-10-03: 2 [IU] via SUBCUTANEOUS

## 2019-09-28 MED ORDER — STROKE: EARLY STAGES OF RECOVERY BOOK
Freq: Once | Status: AC
  Filled 2019-09-28: qty 1

## 2019-09-28 MED ORDER — ACETAMINOPHEN 650 MG RE SUPP: 650.0000 mg | RECTAL | Status: DC | PRN

## 2019-09-28 MED ORDER — LIDOCAINE 5 % EX PTCH
1.0000 | MEDICATED_PATCH | CUTANEOUS | Status: DC
  Administered 2019-09-28 – 2019-10-03 (×6): 1 via TRANSDERMAL
  Filled 2019-09-28 (×7): qty 1

## 2019-09-28 MED ORDER — ATORVASTATIN CALCIUM 40 MG PO TABS
40.0000 mg | ORAL_TABLET | Freq: Every day | ORAL | Status: DC
  Administered 2019-09-28 – 2019-10-03 (×5): 40 mg via ORAL
  Filled 2019-09-28 (×6): qty 1

## 2019-09-28 MED ORDER — IOHEXOL 350 MG/ML SOLN
75.0000 mL | Freq: Once | INTRAVENOUS | Status: AC | PRN
  Administered 2019-09-28: 75 mL via INTRAVENOUS

## 2019-09-28 MED ORDER — SODIUM CHLORIDE 0.9 % IV SOLN: INTRAVENOUS | Status: DC

## 2019-09-28 NOTE — Code Documentation (Addendum)
Patient is a 64 year old male that presented to [facility] via [place].  Code stroke was activated because the patient had sudden onset of confusion and speech difficulty.  When EMT arrived they found him to be aphasic and had a left sided droop, but apparently that is normal for him.  Patient's LKW was 0900 before he was walking his dog.  His wife was the one who called 911 because of his confusion and he could not remember their dog's name.  He has a history of HTN, HLD, DM, obesity, CVA, and TIA.  He was scheduled to see [facility] on 9/3.  Patient had an NIHSS score of 2 with partial hemianopia and mild dysarthria.  Patient is not going to be treated with TPA.  They are going to take him to MRI for further imaging.  Q2h x 12 neuro checks ordered and routine vitals.

## 2019-09-28 NOTE — Progress Notes (Signed)
Pt. Arrived to unit from ED. Received report from Page, RN. Pt is A&O x4. Peripheral IV noted to left wrist; flushing without difficulty and saline locked. Peripheral IV noted to right AC and is infusing with NS @ 50 mL/hr. Pt placed on telemetry. Large hernia noted to right lower quadrant. Pt is oriented to unit. Placed call bell within reach of pt. Bed left in lowest position with bed alarm set.

## 2019-09-28 NOTE — ED Notes (Signed)
Pt was transported to 3W after CT scan was finished.

## 2019-09-28 NOTE — ED Triage Notes (Signed)
Pt was LKN of 0900, after he walked his dog, pt's wife noted that he did not "remember" or "know" things that he normally did. EMS arrived & reported that pt was aphasic and could not identify a hat or a phone, etc. Upon arrival pt was noted to still have some difficulty recalling events of what happened, it was disolving.

## 2019-09-28 NOTE — ED Provider Notes (Signed)
[place] EMERGENCY DEPARTMENT Provider Note   CSN: [id number] Arrival date & time:        History Chief Complaint  Patient presents with  . Code Stroke    Keith Carter is a 64 y.o. male.  The history is provided by the patient, medical records and the EMS personnel. No language interpreter was used.   Keith Carter is a 64 y.o. male who presents to the Emergency Department complaining of code stroke. Level V caveat due to speech difficulties. History is provided by EMS. Per report he was last known well at 9 AM. He went to walk his dog and when he returned he was having speech difficulties. He does have a history of TIA. Symptoms are severe but improving.    Past Medical History:  Diagnosis Date  . Anxiety   . Dyslipidemia   . Enlarged prostate   . Hypertension   . Prediabetes   . TIA (transient ischemic attack)     Patient Active Problem List   Diagnosis Date Noted  . Aphasia 09/28/2019  . Acute CVA (cerebrovascular accident) ([place]) 04/05/2019  . Dysarthria   . Type 2 diabetes mellitus with hyperlipidemia ([place])   . Essential hypertension   . Obesity (BMI 30-39.9)   . Benign prostatic hyperplasia without lower urinary tract symptoms   . Stroke [facility]) 04/04/2019    Past Surgical History:  Procedure Laterality Date  . BACK SURGERY    . HERNIA REPAIR         Family History  Problem Relation Age of Onset  . Stroke Neg Hx     Social History   Tobacco Use  . Smoking status: Never Smoker  . Smokeless tobacco: Never Used  Substance Use Topics  . Alcohol use: Yes    Comment: social  . Drug use: Never    Home Medications Prior to Admission medications   Medication Sig Start Date End Date Taking? Authorizing Provider  acetaminophen (TYLENOL) 500 MG tablet Take 500 mg by mouth every 6 (six) hours as needed.    Yes [provider]  aspirin 81 MG EC tablet Take 81 mg by mouth daily. 05/09/19 05/08/20 Yes [provider]    atorvastatin (LIPITOR) 40 MG tablet Take 1 tablet (40 mg total) by mouth daily at 6 PM. 04/05/19  Yes Wieting, Richard, MD  celecoxib (CELEBREX) 200 MG capsule Take 200 mg by mouth daily. 08/06/19  Yes [provider]  Cholecalciferol 25 MCG (1000 UT) tablet Take 5,000 Units by mouth daily.    Yes [provider]  lisinopril (ZESTRIL) 40 MG tablet Take 40 mg by mouth at bedtime.  03/10/19  Yes [provider]  metFORMIN (GLUCOPHAGE-XR) 500 MG 24 hr tablet Take 500 mg by mouth 2 (two) times daily. 03/10/19  Yes [provider]  PARoxetine (PAXIL) 40 MG tablet Take 40 mg by mouth at bedtime.  02/21/19  Yes [provider]  Potassium 99 MG TABS Take by mouth daily.   Yes [provider]  sildenafil (REVATIO) 20 MG tablet Take 20 mg by mouth daily as needed. 12/16/18  Yes [provider]  tamsulosin (FLOMAX) 0.4 MG CAPS capsule Take 0.8 mg by mouth at bedtime.  02/26/19  Yes [provider]  clopidogrel (PLAVIX) 75 MG tablet Take 1 tablet (75 mg total) by mouth daily. Patient not taking: Reported on 09/28/2019 04/05/19   Loletha Grayer, MD    Allergies    Patient has no known allergies.  Review of Systems   Review of Systems  All other systems reviewed and are negative.   Physical Exam Updated Vital Signs BP (!) 177/118   Pulse 82   Resp (!) 21   SpO2 96%   Physical Exam Vitals and nursing note reviewed.  Constitutional:      Appearance: He is well-developed.  HENT:     Head: Normocephalic and atraumatic.  Cardiovascular:     Rate and Rhythm: Normal rate and regular rhythm.     Heart sounds: No murmur heard.   Pulmonary:     Effort: Pulmonary effort is normal. No respiratory distress.     Breath sounds: Normal breath sounds.  Abdominal:     Palpations: Abdomen is soft.     Tenderness: There is no guarding or rebound.     Comments: Fullness to right hemi abdomen without tenderness  Musculoskeletal:        General:  No tenderness.  Skin:    General: Skin is warm and dry.  Neurological:     Mental Status: He is alert.     Comments: Mild expressive aphasia, mild left sided facial droop.  5/5 strength in all four extremities with sensation to light touch intact in all four extremities.    Psychiatric:        Behavior: Behavior normal.     ED Results / Procedures / Treatments   Labs (all labs ordered are listed, but only abnormal results are displayed) Labs Reviewed  COMPREHENSIVE METABOLIC PANEL - Abnormal; Notable for the following components:      Result Value   Glucose, Bld 165 (*)    Total Protein 6.4 (*)    Albumin 3.4 (*)    All other components within normal limits  I-STAT CHEM 8, ED - Abnormal; Notable for the following components:   Glucose, Bld 158 (*)    All other components within normal limits  CBG MONITORING, ED - Abnormal; Notable for the following components:   Glucose-Capillary 158 (*)    All other components within normal limits  SARS CORONAVIRUS 2 BY RT PCR (HOSPITAL ORDER, [facility])  PROTIME-INR  APTT  CBC  DIFFERENTIAL  HEMOGLOBIN A1C  RAPID URINE DRUG SCREEN, HOSP PERFORMED  TSH    EKG None  Radiology CT HEAD CODE STROKE WO CONTRAST  Result Date: 09/28/2019 CLINICAL DATA:  Code stroke.  Aphasia EXAM: CT HEAD WITHOUT CONTRAST TECHNIQUE: Contiguous axial images were obtained from the base of the skull through the vertex without intravenous contrast. COMPARISON:  04/04/2019 FINDINGS: Brain: No acute intracranial hemorrhage, mass effect, or edema. There is no new loss of gray-white differentiation. Patchy and confluent areas of hypoattenuation in the supratentorial white matter likely reflect stable moderate chronic microvascular ischemic changes. Prominence of the ventricles and sulci reflects stable parenchymal volume loss. Vascular: No hyperdense vessel. There is intracranial atherosclerotic calcification the skull base. Skull:  Unremarkable. Sinuses/Orbits: No acute or significant abnormality. Other: Mastoid air cells are clear. ASPECTS [facility] Stroke Program Early CT Score) - Ganglionic level infarction (caudate, lentiform nuclei, internal capsule, insula, M1-M3 cortex): 7 - Supraganglionic infarction (M4-M6 cortex): 3 Total score (0-10 with 10 being normal): 10 IMPRESSION: No acute intracranial hemorrhage. No evidence of acute infarction. ASPECT score is 10. Moderate chronic microvascular ischemic changes. These results were communicated to Dr. Cheral Marker at 10:29 amon 8/28/2021by text page via the [facility] messaging system. Electronically Signed   By: Macy Mis M.D.   On: 09/28/2019 10:33  Procedures Procedures (including critical care time)  Medications Ordered in ED Medications  aspirin EC tablet 81 mg (has no administration in time range)  atorvastatin (LIPITOR) tablet 40 mg (has no administration in time range)  PARoxetine (PAXIL) tablet 40 mg (has no administration in time range)  tamsulosin (FLOMAX) capsule 0.8 mg (has no administration in time range)  insulin aspart (novoLOG) injection 0-15 Units (has no administration in time range)   stroke: mapping our early stages of recovery book (has no administration in time range)  0.9 %  sodium chloride infusion (has no administration in time range)  acetaminophen (TYLENOL) tablet 650 mg (has no administration in time range)    Or  acetaminophen (TYLENOL) 160 MG/5ML solution 650 mg (has no administration in time range)    Or  acetaminophen (TYLENOL) suppository 650 mg (has no administration in time range)  senna-docusate (Senokot-S) tablet 1 tablet (has no administration in time range)  insulin aspart (novoLOG) injection 0-5 Units (has no administration in time range)  enoxaparin (LOVENOX) injection 40 mg (has no administration in time range)  ondansetron (ZOFRAN) injection 4 mg (has no administration in time range)  lidocaine (LIDODERM) 5 % 1-3 patch (has no  administration in time range)  sodium chloride flush (NS) 0.9 % injection 3 mL (3 mLs Intravenous Given 09/28/19 1153)    ED Course  I have reviewed the triage vital signs and the nursing notes.  Pertinent labs & imaging results that were available during my care of the patient were reviewed by me and considered in my medical decision making (see chart for details).    MDM Rules/Calculators/A&P                         Pt presented to the ED as a code stroke for speech difficulties, last known well as 0900.  He was evaluated by Neurology on ED arrival and deemed not to be a TPA candidate.  Plan to admit to hospitalist service for further work up.    Final Clinical Impression(s) / ED Diagnoses Final diagnoses:  None    Rx / DC Orders ED Discharge Orders    None       Quintella Reichert, MD 09/28/19 1217

## 2019-09-28 NOTE — Consult Note (Signed)
Referring Physician: Dr. Ralene Bathe    Chief Complaint: Acute onset of confusion.   HPI: Keith Carter is an 64 y.o. male with a PMHx of HTN, HLD, DM, morbid obesity, anxiety, BPH, prior stroke and TIA who presents via EMS as a Code Stroke for sudden onset of confusion and memory loss. He was normal on awakening this AM. He then went to walk his dog and on returning home, his wife noted him to be confused with speech difficulties, inability to remember what he had been doing and inability to remember basic facts such as his dog's name. EMS was called and on arrival they noted that the patient was aphasic with naming deficits. On arrival to the ED his speech was significantly improved but with mild dysarthria and increased latency of verbal responses. He had poor orientation. He could recall the basic events of his walk with the dog.   He was admitted to [facility] in March for stroke manifesting with dysarthria/aphasia. Of note, he has had residual intermittent expressive aphasia as well as memory deficits since then. He had follow up with [facility] in April. Per wife his "last stroke was 3 weeks ago but he never went to the hospital for evaluation". He states that he has had a total of 3 strokes in his lifetime, one of which resulted in residual left facial droop.   Home medications include ASA.   LSN: [id number] tPA Given: No: Rapidly resolving symptoms. Overall presentation more consistent with mild transient diffuse encephalopathy than stroke.   Past Medical History:  Diagnosis Date  . Anxiety   . Enlarged prostate   . Hypertension   . TIA (transient ischemic attack)     Past Surgical History:  Procedure Laterality Date  . BACK SURGERY    . HERNIA REPAIR      No family history on file. Social History:  reports that he has never smoked. He has never used smokeless tobacco. He reports current alcohol use. He reports that he does not use drugs.  Allergies: No Known Allergies  Home Medications:  No  current facility-administered medications on file prior to encounter.   Current Outpatient Medications on File Prior to Encounter  Medication Sig Dispense Refill  . acetaminophen (TYLENOL) 500 MG tablet Take 500 mg by mouth every 6 (six) hours as needed.     Marland Kitchen aspirin 81 MG EC tablet Take 81 mg by mouth daily.    Marland Kitchen atorvastatin (LIPITOR) 40 MG tablet Take 1 tablet (40 mg total) by mouth daily at 6 PM. 30 tablet 0  . celecoxib (CELEBREX) 200 MG capsule Take 200 mg by mouth daily.    . Cholecalciferol 25 MCG (1000 UT) tablet Take 5,000 Units by mouth daily.     Marland Kitchen lisinopril (ZESTRIL) 40 MG tablet Take 40 mg by mouth at bedtime.     . metFORMIN (GLUCOPHAGE-XR) 500 MG 24 hr tablet Take 500 mg by mouth 2 (two) times daily.    Marland Kitchen PARoxetine (PAXIL) 40 MG tablet Take 40 mg by mouth at bedtime.     . Potassium 99 MG TABS Take by mouth daily.    . sildenafil (REVATIO) 20 MG tablet Take 20 mg by mouth daily as needed.    . tamsulosin (FLOMAX) 0.4 MG CAPS capsule Take 0.8 mg by mouth at bedtime.     . clopidogrel (PLAVIX) 75 MG tablet Take 1 tablet (75 mg total) by mouth daily. (Patient not taking: Reported on 09/28/2019) 21 tablet 0  Per Pharmacy, his  Plavix has been discontinued  ROS: As per HPI. Detailed ROS deferred in the context of acuity of presentation.   Physical Examination: There were no vitals taken for this visit.  HEENT: [place]/AT Lungs: Respirations unlabored Ext: Warm and well perfused  Neurologic Examination: Mental Status: Awake with decreased level of alertness. Mild dysarthria and increased latency of verbal responses. Speech is fluent with intact naming and comprehension for all commands. Poor orientation to time and place - incorrectly identifies the city, year and month. He does know the day of the week and the state. He can recall the basic events of his walk with the dog.  Cranial Nerves: II:  Visual fields intact with no extinction to DSS. PERRL III,IV, VI: No ptosis. EOMI.   V,VII: Temp sensation equal bilaterally. Left facial droop (chronic per patient) VIII: Hearing intact to voice IX,X: No hoarseness XI: Head is midline XII: Midline tongue extension  Motor: RUE and RLE 5/5 LUE and LLE 5/5 No pronator drift Sensory: Temp and light touch intact in proximal limbs x 4. No extinction to DSS.  Deep Tendon Reflexes:  No asymmetry noted.  Plantars: Right: downgoing   Left:Upgoing Cerebellar: No ataxia with FNF bilaterally. Action tremor present bilaterally.  Gait: Deferred  Results for orders placed or performed during the hospital encounter of 09/28/19 (from the past 48 hour(s))  CBG monitoring, ED     Status: Abnormal   Collection Time: 09/28/19 10:17 AM  Result Value Ref Range   Glucose-Capillary 158 (H) 70 - 99 mg/dL    Comment: Glucose reference range applies only to samples taken after fasting for at least 8 hours.   Comment 1 Notify RN    Comment 2 Document in Chart    MRI brain from March 2021: Background pattern of atrophy and chronic small-vessel ischemic change throughout the brain. Acute subcortical and deep white matter infarction in the left posterior frontal lobe. No large vessel territory infarction. No hemorrhage or mass effect  Carotid ultrasound from March 2021: 1. Minimal to moderate amount of left-sided atherosclerotic plaque, not resulting in a hemodynamically significant stenosis. 2. Minimal amount of right-sided intimal thickening, not resulting in a hemodynamically significant stenosis.  TTE from March 2021: 1. Left ventricular ejection fraction, by estimation, is 60 to 65%. The  left ventricle has normal function. The left ventricle has no regional  wall motion abnormalities. Left ventricular diastolic parameters were  normal.  2. Right ventricular systolic function was not well visualized. The right  ventricular size is not well visualized. Tricuspid regurgitation signal is  inadequate for assessing PA pressure.  3.  The mitral valve was not well visualized. No evidence of mitral valve  regurgitation. No evidence of mitral stenosis.  4. The aortic valve is tricuspid. Aortic valve regurgitation is not  visualized. There is no aortic valve stenosis.  5. Aortic dilatation noted. There is mild dilatation of the aortic root  measuring 38 mm.  6. Mildly dilated pulmonary artery.  7. Bubble study is limited by poor windows. There does not appear to be a  large intracardiac shunt, though a small shunt cannot be entirely  excluded.   Assessment: 64 y.o. male with a PMHx of HTN, HLD, DM, morbid obesity, anxiety, BPH, prior stroke and TIA who presents via EMS as a Code Stroke for sudden onset of confusion and memory loss  1. Exam reveals chronic left facial droop and upgoing toe on the left; the patient states that his facial droop is chronic. Bilateral action tremor  is also noted. Overall clinical presentation more consistent with resolving altered mental status rather than a stroke or TIA. If his presentation is due to stroke, rapidly resolving symptoms and relatively minor deficits on exam favor observation over acute thrombolytic administration; risks of tPA significantly outweigh potential benefits.  2. CT head: Chronic microvascular ischemic changes. No acute abnormality.  3. Stroke Risk Factors - HTN, HLD, DM, morbid obesity, prior stroke/TIA.  4. EKG: Sinus rhythm  Recommendations: 1. HgbA1c, fasting lipid panel 2. MRI and MRA of the brain without contrast 3. PT consult, OT consult, Speech consult 4. Cardiac telemetry 5. Prophylactic therapy- Continue ASA and atorvastatin 6. Risk factor modification 7. Q2h neuro checks 8. Speech therapy evaluation 9. IVF 10. BP management.  11. Toxic/metabolic/infectious work up  @Electronically  signed: Dr. Kerney Elbe  09/28/2019, 10:19 AM

## 2019-09-28 NOTE — Progress Notes (Signed)
  Echocardiogram 2D Echocardiogram has been performed.  Bobbye Charleston 09/28/2019, 3:51 PM

## 2019-09-28 NOTE — H&P (Signed)
History and Physical    Hershal Eriksson AST:[id number] DOB: 1955-11-11 DOA: 09/28/2019  PCP: [name], NP Consultants:  Manuella Ghazi - neurology; [place] - neurosurgery (appt on 9/3, worsening back pain); Gilford Rile, NP - cardiology Patient coming from:  Home - lives with wife; NOK: Wife, Katharine Look [phone]  Chief Complaint: Aphasia  HPI: Keith Carter is a 64 y.o. male with medical history significant of CVA (04/2019); HTN; HLD; DM; morbid obesity; BPH; and anxiety presenting with aphasia.  He has had TIAs in the past.  He was admitted in March at [facility] for dysarthria/aphasia.  Since then, he has some memory difficulties and periodic expressive aphasia.  His back has been getting worse and so his mobility has not been as good.  His daughter had lunch with him yesterday, noticed left foot difficulty with gait but thought this was due to his back.  This AM, his wife called 75 - he wasn't able to remember his dog's name, address, birthday.  He was having trouble putting together sentences.  He feels fine otherwise.  Speech is the primary issue, also memory but not significantly worse since March.  He has fallen a couple of times recently at stores.  MRI from 04/04/19 confirms acute subcortical and deep white matter infarction in the left posterior frontal lobe.    ED Course:  H/o CVA in the spring, presented as Code stroke with aphasia, confusion.  Neurology recommends stroke work-up although possibly more encephalopathy.   Review of Systems: As per HPI; otherwise review of systems reviewed and negative.   Ambulatory Status:  Ambulates with a cane, but may need walker due to subacute left leg weakness  COVID Vaccine Status:   Complete  Past Medical History:  Diagnosis Date  . Anxiety   . Dyslipidemia   . Enlarged prostate   . Hypertension   . Prediabetes   . TIA (transient ischemic attack)     Past Surgical History:  Procedure Laterality Date  . BACK SURGERY    . HERNIA REPAIR       Social History   Socioeconomic History  . Marital status: Married    Spouse name: Not on file  . Number of children: Not on file  . Years of education: Not on file  . Highest education level: Not on file  Occupational History  . Occupation: disabled due to back issues  Tobacco Use  . Smoking status: Never Smoker  . Smokeless tobacco: Never Used  Substance and Sexual Activity  . Alcohol use: Yes    Comment: social  . Drug use: Never  . Sexual activity: Not on file  Other Topics Concern  . Not on file  Social History Narrative  . Not on file   Social Determinants of Health   Financial Resource Strain:   . Difficulty of Paying Living Expenses: Not on file  Food Insecurity:   . Worried About Charity fundraiser in the Last Year: Not on file  . Ran Out of Food in the Last Year: Not on file  Transportation Needs:   . Lack of Transportation (Medical): Not on file  . Lack of Transportation (Non-Medical): Not on file  Physical Activity:   . Days of Exercise per Week: Not on file  . Minutes of Exercise per Session: Not on file  Stress:   . Feeling of Stress : Not on file  Social Connections:   . Frequency of Communication with Friends and Family: Not on file  . Frequency of Social Gatherings  with Friends and Family: Not on file  . Attends Religious Services: Not on file  . Active Member of Clubs or Organizations: Not on file  . Attends Archivist Meetings: Not on file  . Marital Status: Not on file  Intimate Partner Violence:   . Fear of Current or Ex-Partner: Not on file  . Emotionally Abused: Not on file  . Physically Abused: Not on file  . Sexually Abused: Not on file    No Known Allergies  Family History  Problem Relation Age of Onset  . Stroke Neg Hx     Prior to Admission medications   Medication Sig Start Date End Date Taking? Authorizing Provider  acetaminophen (TYLENOL) 500 MG tablet Take 500 mg by mouth every 6 (six) hours as needed.    Yes  [provider]  aspirin 81 MG EC tablet Take 81 mg by mouth daily. 05/09/19 05/08/20 Yes [provider]  atorvastatin (LIPITOR) 40 MG tablet Take 1 tablet (40 mg total) by mouth daily at 6 PM. 04/05/19  Yes Wieting, Richard, MD  celecoxib (CELEBREX) 200 MG capsule Take 200 mg by mouth daily. 08/06/19  Yes [provider]  Cholecalciferol 25 MCG (1000 UT) tablet Take 5,000 Units by mouth daily.    Yes [provider]  lisinopril (ZESTRIL) 40 MG tablet Take 40 mg by mouth at bedtime.  03/10/19  Yes [provider]  metFORMIN (GLUCOPHAGE-XR) 500 MG 24 hr tablet Take 500 mg by mouth 2 (two) times daily. 03/10/19  Yes [provider]  PARoxetine (PAXIL) 40 MG tablet Take 40 mg by mouth at bedtime.  02/21/19  Yes [provider]  Potassium 99 MG TABS Take by mouth daily.   Yes [provider]  sildenafil (REVATIO) 20 MG tablet Take 20 mg by mouth daily as needed. 12/16/18  Yes [provider]  tamsulosin (FLOMAX) 0.4 MG CAPS capsule Take 0.8 mg by mouth at bedtime.  02/26/19  Yes [provider]  clopidogrel (PLAVIX) 75 MG tablet Take 1 tablet (75 mg total) by mouth daily. Patient not taking: Reported on 09/28/2019 04/05/19   Loletha Grayer, MD    Physical Exam: Vitals:   09/28/19 1100 09/28/19 1115 09/28/19 1130 09/28/19 1145  BP: (!) 161/98 (!) 165/118 (!) 165/106 (!) 177/118  Pulse: 75 81 76 82  Resp: (!) 21 19 18  (!) 21  SpO2: 96% 93% 94% 96%     . General:  Appears calm and comfortable and is NAD . Eyes:  PERRL, EOMI, normal lids, iris . ENT:  grossly normal hearing, lips & tongue, mmm; poor dentition . Neck:  no LAD, masses or thyromegaly; no carotid bruits . Cardiovascular:  RRR, no m/r/g. No LE edema.  Marland Kitchen Respiratory:   CTA bilaterally with no wheezes/rales/rhonchi.  Normal respiratory effort. . Abdomen:  soft, NT, ND, NABS . Back:   normal alignment, no CVAT . Skin:  no rash or induration seen on limited  exam . Musculoskeletal:  grossly normal tone BUE/BLE, good ROM, no bony abnormality . Psychiatric:  grossly normal mood and affect, speech dysarthric but appropriate, mild aphasia, AOx2 . Neurologic:  CN 2-12 grossly intact, moves all extremities in coordinated fashion, sensation intact    Radiological Exams on Admission: CT HEAD CODE STROKE WO CONTRAST  Result Date: 09/28/2019 CLINICAL DATA:  Code stroke.  Aphasia EXAM: CT HEAD WITHOUT CONTRAST TECHNIQUE: Contiguous axial images were obtained from the base of the skull through the vertex without intravenous contrast. COMPARISON:  04/04/2019 FINDINGS: Brain: No acute intracranial hemorrhage, mass effect, or edema. There is no new loss of gray-white differentiation. Patchy and confluent areas of hypoattenuation in the supratentorial white matter likely reflect stable moderate chronic microvascular ischemic changes. Prominence of the ventricles and sulci reflects stable parenchymal volume loss. Vascular: No hyperdense vessel. There is intracranial atherosclerotic calcification the skull base. Skull: Unremarkable. Sinuses/Orbits: No acute or significant abnormality. Other: Mastoid air cells are clear. ASPECTS [facility] Stroke Program Early CT Score) - Ganglionic level infarction (caudate, lentiform nuclei, internal capsule, insula, M1-M3 cortex): 7 - Supraganglionic infarction (M4-M6 cortex): 3 Total score (0-10 with 10 being normal): 10 IMPRESSION: No acute intracranial hemorrhage. No evidence of acute infarction. ASPECT score is 10. Moderate chronic microvascular ischemic changes. These results were communicated to Dr. Cheral Marker at 10:29 amon 8/28/2021by text page via the [facility] messaging system. Electronically Signed   By: Macy Mis M.D.   On: 09/28/2019 10:33    EKG: Independently reviewed.  NSR with rate 78; RBBB and LAFB with no evidence of acute ischemia   Labs on Admission: I have personally reviewed the available labs and imaging studies at  the time of the admission.  Pertinent labs:   Glucose 165 Albumin 3.4 Normal CBC INR 1.0 COVID pending   Assessment/Plan Principal Problem:   Aphasia Active Problems:   Dysarthria   Type 2 diabetes mellitus with hyperlipidemia (HCC)   Essential hypertension   Obesity (BMI 30-39.42)    Aphasia/dysarthia, concerning for TIA/CVA -Concerning for TIA/CVA -Will place in observation status for CVA/TIA evaluation -Telemetry monitoring -MRI -CTA head/neck -Echo -Risk stratification with FLP, A1c; will also check TSH and UDS -Continue ASA daily, but this appears to be ASA failure and so likely to need DAPT followed by transition to Plavix -Neurology consult -PT/OT/ST/Nutrition Consults  HTN -Allow permissive HTN for now -Treat BP only if >220/120, and then with goal of 15% reduction -Hold lisinopril and plan to restart in 48-72 hours   HLD -Check FLP -Continue Lipitor 40 mg daily for now   DM -Prior A1c showed reasonably good control; will recheck -Hold home PO Glucophage -Will order moderate-scale SSI   Obesity -BMI 37.9 -Weight loss should be encouraged -Outpatient PCP/bariatric medicine/bariatric surgery f/u encouraged      Note: This patient has been tested and is pending for the novel coronavirus COVID-19; he is fully vaccinated.     DVT prophylaxis:  Lovenox  Code Status: DNR - confirmed with patient/family Family Communication: Daughter present throughout evaluation Disposition Plan:  The patient is from: home  Anticipated d/c is to: home without [facility] services   Anticipated d/c date will depend on clinical response to treatment, but possibly as early as tomorrow if he has excellent response to treatment  Patient is currently: acutely ill Consults called: Neurology; PT/OT/ST/Nutrition  Admission status: It is my clinical opinion that referral for OBSERVATION is reasonable and necessary in this patient based on the above information provided. The  aforementioned taken together are felt to place the patient at high risk for further clinical deterioration. However it is anticipated that the patient may be medically stable for discharge from the hospital within 24 to 48 hours.      Karmen Bongo MD [facility]   How to contact the [facility] Attending or Consulting provider [place] or covering provider during after hours [place], for this patient?  1. Check the care team in [facility] and look for a) attending/consulting [facility] provider listed and b) the [facility] team  listed 2. Log into [email or web address] and use [place]'s universal password to access. If you do not have the password, please contact the hospital operator. 3. Locate the [facility] provider you are looking for under [facility] and page to a number that you can be directly reached. 4. If you still have difficulty reaching the provider, please page the [facility] (Director on Call) for the Hospitalists listed on amion for assistance.   09/28/2019, 12:15 PM

## 2019-09-29 DIAGNOSIS — I69311 Memory deficit following cerebral infarction: Secondary | ICD-10-CM | POA: Diagnosis not present

## 2019-09-29 DIAGNOSIS — Z7902 Long term (current) use of antithrombotics/antiplatelets: Secondary | ICD-10-CM | POA: Diagnosis not present

## 2019-09-29 DIAGNOSIS — R471 Dysarthria and anarthria: Secondary | ICD-10-CM

## 2019-09-29 DIAGNOSIS — E1169 Type 2 diabetes mellitus with other specified complication: Secondary | ICD-10-CM

## 2019-09-29 DIAGNOSIS — Z7982 Long term (current) use of aspirin: Secondary | ICD-10-CM | POA: Diagnosis not present

## 2019-09-29 DIAGNOSIS — E669 Obesity, unspecified: Secondary | ICD-10-CM | POA: Diagnosis present

## 2019-09-29 DIAGNOSIS — Z66 Do not resuscitate: Secondary | ICD-10-CM | POA: Diagnosis present

## 2019-09-29 DIAGNOSIS — E785 Hyperlipidemia, unspecified: Secondary | ICD-10-CM | POA: Diagnosis present

## 2019-09-29 DIAGNOSIS — I69320 Aphasia following cerebral infarction: Secondary | ICD-10-CM | POA: Diagnosis not present

## 2019-09-29 DIAGNOSIS — R17 Unspecified jaundice: Secondary | ICD-10-CM | POA: Diagnosis present

## 2019-09-29 DIAGNOSIS — F419 Anxiety disorder, unspecified: Secondary | ICD-10-CM | POA: Diagnosis present

## 2019-09-29 DIAGNOSIS — G459 Transient cerebral ischemic attack, unspecified: Secondary | ICD-10-CM | POA: Diagnosis present

## 2019-09-29 DIAGNOSIS — R569 Unspecified convulsions: Secondary | ICD-10-CM | POA: Diagnosis present

## 2019-09-29 DIAGNOSIS — E041 Nontoxic single thyroid nodule: Secondary | ICD-10-CM | POA: Diagnosis present

## 2019-09-29 DIAGNOSIS — N400 Enlarged prostate without lower urinary tract symptoms: Secondary | ICD-10-CM | POA: Diagnosis present

## 2019-09-29 DIAGNOSIS — Z791 Long term (current) use of non-steroidal anti-inflammatories (NSAID): Secondary | ICD-10-CM | POA: Diagnosis not present

## 2019-09-29 DIAGNOSIS — E876 Hypokalemia: Secondary | ICD-10-CM | POA: Diagnosis not present

## 2019-09-29 DIAGNOSIS — Z20822 Contact with and (suspected) exposure to covid-19: Secondary | ICD-10-CM | POA: Diagnosis present

## 2019-09-29 DIAGNOSIS — I10 Essential (primary) hypertension: Secondary | ICD-10-CM

## 2019-09-29 DIAGNOSIS — Z6837 Body mass index (BMI) 37.0-37.9, adult: Secondary | ICD-10-CM | POA: Diagnosis not present

## 2019-09-29 DIAGNOSIS — R4701 Aphasia: Secondary | ICD-10-CM | POA: Diagnosis present

## 2019-09-29 DIAGNOSIS — I69392 Facial weakness following cerebral infarction: Secondary | ICD-10-CM | POA: Diagnosis not present

## 2019-09-29 LAB — COMPREHENSIVE METABOLIC PANEL
ALT: 17 U/L (ref 0–44)
AST: 17 U/L (ref 15–41)
Albumin: 3.4 g/dL — ABNORMAL LOW (ref 3.5–5.0)
Alkaline Phosphatase: 74 U/L (ref 38–126)
Anion gap: 12 (ref 5–15)
BUN: 11 mg/dL (ref 8–23)
CO2: 22 mmol/L (ref 22–32)
Calcium: 9 mg/dL (ref 8.9–10.3)
Chloride: 103 mmol/L (ref 98–111)
Creatinine, Ser: 0.9 mg/dL (ref 0.61–1.24)
GFR calc Af Amer: >60 mL/min (ref >60)
GFR calc non Af Amer: >60 mL/min (ref >60)
Glucose, Bld: 161 mg/dL — ABNORMAL HIGH (ref 70–99)
Potassium: 3.9 mmol/L (ref 3.5–5.1)
Sodium: 137 mmol/L (ref 135–145)
Total Bilirubin: 1.3 mg/dL — ABNORMAL HIGH (ref 0.3–1.2)
Total Protein: 6.4 g/dL — ABNORMAL LOW (ref 6.5–8.1)

## 2019-09-29 LAB — LIPID PANEL
Cholesterol: 122 mg/dL (ref 0–200)
HDL: 49 mg/dL (ref >40)
LDL Cholesterol: 54 mg/dL (ref 0–99)
Total CHOL/HDL Ratio: 2.5 RATIO
Triglycerides: 96 mg/dL (ref <150)
VLDL: 19 mg/dL (ref 0–40)

## 2019-09-29 LAB — GLUCOSE, CAPILLARY
Glucose-Capillary: 137 mg/dL — ABNORMAL HIGH (ref 70–99)
Glucose-Capillary: 141 mg/dL — ABNORMAL HIGH (ref 70–99)
Glucose-Capillary: 189 mg/dL — ABNORMAL HIGH (ref 70–99)
Glucose-Capillary: 119 mg/dL — ABNORMAL HIGH (ref 70–99)
Glucose-Capillary: 201 mg/dL — ABNORMAL HIGH (ref 70–99)

## 2019-09-29 LAB — CBC WITH DIFFERENTIAL/PLATELET
Abs Immature Granulocytes: 0.04 10*3/uL (ref 0.00–0.07)
Basophils Absolute: 0.1 10*3/uL (ref 0.0–0.1)
Basophils Relative: 1 %
Eosinophils Absolute: 0.1 10*3/uL (ref 0.0–0.5)
Eosinophils Relative: 2 %
HCT: 45.3 % (ref 39.0–52.0)
Hemoglobin: 14.8 g/dL (ref 13.0–17.0)
Immature Granulocytes: 1 %
Lymphocytes Relative: 12 %
Lymphs Abs: 1 10*3/uL (ref 0.7–4.0)
MCH: 29.5 pg (ref 26.0–34.0)
MCHC: 32.7 g/dL (ref 30.0–36.0)
MCV: 90.2 fL (ref 80.0–100.0)
Monocytes Absolute: 0.6 10*3/uL (ref 0.1–1.0)
Monocytes Relative: 7 %
Neutro Abs: 6.6 10*3/uL (ref 1.7–7.7)
Neutrophils Relative %: 77 %
Platelets: 256 10*3/uL (ref 150–400)
RBC: 5.02 MIL/uL (ref 4.22–5.81)
RDW: 14.2 % (ref 11.5–15.5)
WBC: 8.4 10*3/uL (ref 4.0–10.5)
nRBC: 0 % (ref 0.0–0.2)

## 2019-09-29 LAB — PHOSPHORUS: Phosphorus: 2.3 mg/dL — ABNORMAL LOW (ref 2.5–4.6)

## 2019-09-29 LAB — MAGNESIUM: Magnesium: 1.3 mg/dL — ABNORMAL LOW (ref 1.7–2.4)

## 2019-09-29 MED ORDER — MAGNESIUM SULFATE 4 GM/100ML IV SOLN
4.0000 g | Freq: Once | INTRAVENOUS | Status: AC
  Administered 2019-09-29: 4 g via INTRAVENOUS
  Filled 2019-09-29: qty 100

## 2019-09-29 MED ORDER — K PHOS MONO-SOD PHOS DI & MONO 155-852-130 MG PO TABS
500.0000 mg | ORAL_TABLET | Freq: Once | ORAL | Status: AC
  Administered 2019-09-29: 500 mg via ORAL
  Filled 2019-09-29: qty 2

## 2019-09-29 NOTE — Plan of Care (Signed)
  Problem: Education: Goal: Knowledge of General Education information will improve Description: Including pain rating scale, medication(s)/side effects and non-pharmacologic comfort measures Outcome: Progressing   Problem: Activity: Goal: Risk for activity intolerance will decrease Outcome: Progressing   Problem: Nutrition: Goal: Adequate nutrition will be maintained Outcome: Progressing   Problem: Coping: Goal: Level of anxiety will decrease Outcome: Progressing   

## 2019-09-29 NOTE — Progress Notes (Signed)
Pt expresses concern for recommendation to SNF. Pt states "I have to care for my wife, I don't want to go to a facility where I have to spend the night". Delsa Sale, RN and nursing instructor Marvell Fuller, PhD, RN notified.

## 2019-09-29 NOTE — Care Management Obs Status (Signed)
MEDICARE OBSERVATION STATUS NOTIFICATION   Patient Details  Name: Gauge Winski MRN: [id number] Date of Birth: April 24, 1955   Medicare Observation Status Notification Given:  Yes  Notice given over the phone signed for patient with his permission. Notice sent to patient.   Verdell Carmine, RN 09/29/2019, 3:01 PM

## 2019-09-29 NOTE — Progress Notes (Signed)
SLP Cancellation Note  Patient Details Name: Kleber Crean MRN: [id number] DOB: 1955-04-25   Cancelled treatment:       Reason Eval/Treat Not Completed: Patient at procedure or test/unavailable  Patient working with PT.  ST will continue efforts to complete cognitive/linguistic evaluation.    Shelly Flatten, [place], [place] [facility] SLP [phone]

## 2019-09-29 NOTE — Progress Notes (Signed)
This student-RN wrote a note for SW to see pt regarding home dynamics as pt cares for his wife who has Parkinson's. Pt cares for wife and doesn't put his needs as a priority. Note was written and slid under SW door addressed to [employer].

## 2019-09-29 NOTE — Evaluation (Signed)
Physical Therapy Evaluation Patient Details Name: Keith Carter MRN: [id number] DOB: 09-07-1955 Today's Date: 09/29/2019   History of Present Illness  Norbert Malkin is a 64 y.o. male with medical history significant of CVA (04/2019); HTN; HLD; DM; morbid obesity; BPH; and anxiety presenting with aphasia.  He has had TIAs in the past.  He was admitted in March at [facility] for dysarthria/aphasia.  Since then, he has some memory difficulties and periodic expressive aphasia.  His back has been getting worse and so his mobility has not been as good.    Clinical Impression  Pt walked 50 feet with min guard/assist with RW - fatiqued after this.  He has flexed posture and increased fall risk.  Pt reports he is close to baseline.  Pt said he used his RW at home - then he said he doesn't - when he walks his 2 dogs.  Pt also cares for his wife with Parkinsons - he said he doesn't help her physically but does everything around the house (orders groceries to be delivered and pays someone to mow the grass).  I told pt I was concerned for him to go home - he agreed and then he says he is ready to go.  At this time I recommend pt go short term SNF to get as strong as he can prior to home - he is not in agreement with this recommendation as he needs to care for wife and dogs.  PT will continue to follow him on acute.    Follow Up Recommendations SNF (Pt doesnt want to go SNF - then would rec [facility])    Equipment Recommendations  None recommended by PT    Recommendations for Other Services       Precautions / Restrictions Precautions Precautions: Fall Precaution Comments: Pt reports he has had 2 recent falls Restrictions Weight Bearing Restrictions: No      Mobility  Bed Mobility Overal bed mobility: Needs Assistance Bed Mobility: Supine to Sit     Supine to sit: HOB elevated     General bed mobility comments: pt sat up EOB with HOB raised - able to scoot to the edge of bed.  He used urinal upon sitting - had  hard time getting all urine into the urinal  Transfers Overall transfer level: Needs assistance Equipment used: Rolling walker (2 wheeled) Transfers: Sit to/from Stand Sit to Stand: Min guard            Ambulation/Gait Ambulation/Gait assistance: Min guard Gait Distance (Feet): 50 Feet Assistive device: Rolling walker (2 wheeled) Gait Pattern/deviations: Trunk flexed;Decreased stride length;Shuffle     General Gait Details: Pt walked with RW - short strides and flexed posture - he said this is his normal gait.  Stairs            Wheelchair Mobility    Modified Rankin (Stroke Patients Only)       Balance Overall balance assessment: Mild deficits observed, not formally tested;History of Falls                                           Pertinent Vitals/Pain Pain Assessment: No/denies pain    Home Living Family/patient expects to be discharged to:: Private residence Living Arrangements: Spouse/significant other Available Help at Discharge: Available PRN/intermittently;Family;Other (Comment) Type of Home: Mobile home Home Access: Ramped entrance     Home Layout: One level Home  Equipment: Gilford Rile - 4 wheels;Walker - 2 wheels;Cane - single point Additional Comments: Patient states he has access to equipment d/t his wife, but he only utilizes SPC.    Prior Function Level of Independence: Independent with assistive device(s)         Comments: Pt reports he does his own dressing and house cleaning.   He calls in grocery orders and has them delivered.  He walks his 2 dogs.  He rports he doesnt physically have to help his wife (who has Parkinsons)     Hand Dominance        Extremity/Trunk Assessment   Upper Extremity Assessment Upper Extremity Assessment: Defer to OT evaluation    Lower Extremity Assessment Lower Extremity Assessment: Generalized weakness (Pt denies numbness in lower legs.  Pt delayed in his movement of both legs -  generally weak B - 4-/5)    Cervical / Trunk Assessment Cervical / Trunk Assessment: Kyphotic  Communication   Communication: Expressive difficulties (Pt often has different answers for questions - says he uses RW and later says he doesnt, etc)  Cognition Arousal/Alertness: Awake/alert Behavior During Therapy: Flat affect Overall Cognitive Status: No family/caregiver present to determine baseline cognitive functioning                                 General Comments: Pt cooperative - he says he is physically at his baseline      General Comments      Exercises     Assessment/Plan    PT Assessment Patient needs continued PT services  PT Problem List Decreased strength;Decreased mobility;Decreased activity tolerance;Decreased balance;Decreased knowledge of use of DME;Decreased safety awareness       PT Treatment Interventions DME instruction;Therapeutic activities;Gait training;Therapeutic exercise;Patient/family education;Balance training;Functional mobility training    PT Goals (Current goals can be found in the Care Plan section)  Acute Rehab PT Goals Patient Stated Goal: to get home with wife PT Goal Formulation: With patient Time For Goal Achievement: 10/11/19 Potential to Achieve Goals: Good    Frequency Min 3X/week   Barriers to discharge Decreased caregiver support pt is caregiver for his wife and 2 dogs -he said not a lot of other support - but some friends can help PRN    Co-evaluation               AM-PAC PT "6 Clicks" Mobility  Outcome Measure Help needed turning from your back to your side while in a flat bed without using bedrails?: A Little Help needed moving from lying on your back to sitting on the side of a flat bed without using bedrails?: A Little Help needed moving to and from a bed to a chair (including a wheelchair)?: A Little Help needed standing up from a chair using your arms (e.g., wheelchair or bedside chair)?: A  Little Help needed to walk in hospital room?: A Little Help needed climbing 3-5 steps with a railing? : A Little 6 Click Score: 18    End of Session Equipment Utilized During Treatment: Gait belt Activity Tolerance: Patient tolerated treatment well;No increased pain;Patient limited by fatigue Patient left: in chair;with chair alarm set;with call bell/phone within reach Nurse Communication: Mobility status PT Visit Diagnosis: Unsteadiness on feet (R26.81);Repeated falls (R29.6);Difficulty in walking, not elsewhere classified (R26.2);Muscle weakness (generalized) (M62.81)    Time: 6301-6010 PT Time Calculation (min) (ACUTE ONLY): 45 min   Charges:   PT Evaluation $PT Eval Low  Complexity: 1 Low PT Treatments $Gait Training: 8-22 mins $Therapeutic Activity: 8-22 mins        09/29/2019   Rande Lawman, PT   Loyal Buba 09/29/2019, 10:12 AM

## 2019-09-29 NOTE — Progress Notes (Signed)
PROGRESS NOTE    Keith Carter  BOF:[id number] DOB: 1955/03/09 DOA: 09/28/2019 PCP: [name], NP   Brief Narrative:  HPI per Dr. Karmen Bongo on 09/28/19 Keith Carter is a 64 y.o. male with medical history significant of CVA (04/2019); HTN; HLD; DM; morbid obesity; BPH; and anxiety presenting with aphasia.  He has had TIAs in the past.  He was admitted in March at [facility] for dysarthria/aphasia.  Since then, he has some memory difficulties and periodic expressive aphasia.  His back has been getting worse and so his mobility has not been as good.  His daughter had lunch with him yesterday, noticed left foot difficulty with gait but thought this was due to his back.  This AM, his wife called 38 - he wasn't able to remember his dog's name, address, birthday.  He was having trouble putting together sentences.  He feels fine otherwise.  Speech is the primary issue, also memory but not significantly worse since March.  He has fallen a couple of times recently at stores.  MRI from 04/04/19 confirms acute subcortical and deep white matter infarction in the left posterior frontal lobe.    ED Course:  H/o CVA in the spring, presented as Code stroke with aphasia, confusion.  Neurology recommends stroke work-up although possibly more encephalopathy.  **Interim History Stroke team neurology to evaluate.  PT OT recommending SNF but patient wants to go home.  Further recommendations per neurology team.  Assessment & Plan:   Principal Problem:   Aphasia Active Problems:   Dysarthria   Type 2 diabetes mellitus with hyperlipidemia (HCC)   Essential hypertension   Obesity (BMI 30-39.9)   TIA (transient ischemic attack)  Aphasia/dysarthia, concerning for TIA/CVA; Likely TIA -Concerning for TIA/CVA -Placed in observation status for CVA/TIA evaluation but will change to Inpatient  -C/w Telemetry monitoring -MRI showed "No evidence of recent infarction, hemorrhage, or mass. Chronic microvascular  ischemic changes, chronic small vessel infarcts, and chronic microhemorrhages as seen previously." -CTA head/neck showed "Negative for intracranial large vessel occlusion or flow limiting Stenosis  No significant carotid or vertebral artery stenosis in the neck. Minimal atherosclerotic disease." -Echo -Risk stratification with FLP, A1c; will also check TSH and UDS -Lipid panel done and showed a total cholesterol/HDL ratio of 2.5, cholesterol level 122, HDL of 49, LDL of 54, triglycerides of 96, VLDL 19 -Continue ASA daily, but this appears to be ASA failure and so likely to need DAPT followed by transition to Plavix but will defer to Neurology Stroke Team to initiate  -Neurology consulted and Stroke team to still see the patient  -PT/OT/ST/Nutrition Consults; Recommending SNF -Currently getting IVF with NS at 50 mL/hr  HTN -Allow permissive HTNfor now -Treat BP only if >220/120, and then with goal of 15% reduction -Holdlisinopriland plan to restart in 48-72 hours -BP was 163/100  HLD -Lipid Panel as Above -Continue Lipitor 40 mg daily for now  DM Type 2 -Prior A1c showed reasonably good control; will recheck and was 7.1 -Hold home PO Glucophage -Will order moderate-scale SSI -CBG's ranging from 119-189  Obesity -Estimated body mass index is 37.87 kg/m as calculated from the following:   Height as of this encounter: 5\' 11"  (1.803 m).   Weight as of 04/19/19: 123.2 kg. -Weight loss and Dietary Counseling should be encouraged -Outpatient PCP/bariatric medicine/bariatric surgery f/u encouraged  Hypomagnesemia -Patient's magnesium level is 1.3 -Replete with IV mag sulfate 4 g  -Continue to monitor and replete as necessary -Repeat magnesium level in a.m.  Hypophosphatemia -Patient's phosphorus level was 2.3 -Replete with p.o. K-Phos 500 mg x 1 -Continue monitor and replete as necessary -Repeat phosphorus level in a.m.  Hyperbilirubinemia -Mild with a T bili going from  0.9 -> 1.3 -Continue to monitor and trend and repeat CMP in a.m.   DVT prophylaxis: Enoxaparin 40 mg sq q24h Code Status: DO NOT RESUSCITATE  Family Communication: No family present at bedside  Disposition Plan: PT/OT recommending SNF but patient rather would go home with Home Health   Status is: Inpatient  Remains inpatient appropriate because:Ongoing diagnostic testing needed not appropriate for outpatient work up, Unsafe d/c plan and Inpatient level of care appropriate due to severity of illness   Dispo: The patient is from: Home              Anticipated d/c is to: Home as he is refusing SNF              Anticipated d/c date is: 1 day              Patient currently is not medically stable to d/c.  Consultants:   Neurology   Procedures:  ECHOCARDIOGRAM IMPRESSIONS    1. Left ventricular ejection fraction, by estimation, is 50 to 55%. The  left ventricle has low normal function. The left ventricle has no regional  wall motion abnormalities. There is mild left ventricular hypertrophy.  Left ventricular diastolic  parameters are indeterminate.  2. Right ventricular systolic function is normal. The right ventricular  size is normal. Tricuspid regurgitation signal is inadequate for assessing  PA pressure.  3. The mitral valve is grossly normal. Trivial mitral valve  regurgitation.  4. The aortic valve is tricuspid. Aortic valve regurgitation is not  visualized. Mild aortic valve sclerosis is present, with no evidence of  aortic valve stenosis.  5. The inferior vena cava is normal in size with greater than 50%  respiratory variability, suggesting right atrial pressure of 3 mmHg.   FINDINGS  Left Ventricle: Left ventricular ejection fraction, by estimation, is 50  to 55%. The left ventricle has low normal function. The left ventricle has  no regional wall motion abnormalities. Definity contrast agent was given  IV to delineate the left  ventricular endocardial  borders. The left ventricular internal cavity size  was normal in size. There is mild left ventricular hypertrophy. Left  ventricular diastolic parameters are indeterminate.   Right Ventricle: The right ventricular size is normal. No increase in  right ventricular wall thickness. Right ventricular systolic function is  normal. Tricuspid regurgitation signal is inadequate for assessing PA  pressure.   Left Atrium: Left atrial size was normal in size.   Right Atrium: Right atrial size was normal in size.   Pericardium: There is no evidence of pericardial effusion.   Mitral Valve: The mitral valve is grossly normal. Mild mitral annular  calcification. Trivial mitral valve regurgitation.   Tricuspid Valve: The tricuspid valve is grossly normal. Tricuspid valve  regurgitation is trivial.   Aortic Valve: The aortic valve is tricuspid. Aortic valve regurgitation is  not visualized. Mild aortic valve sclerosis is present, with no evidence  of aortic valve stenosis.   Pulmonic Valve: The pulmonic valve was grossly normal. Pulmonic valve  regurgitation is not visualized.   Aorta: The aortic root is normal in size and structure.   Venous: The inferior vena cava is normal in size with greater than 50%  respiratory variability, suggesting right atrial pressure of 3 mmHg.   IAS/Shunts: No  atrial level shunt detected by color flow Doppler.     LEFT VENTRICLE  PLAX 2D  LVIDd:     4.70 cm   Diastology  LVIDs:     3.60 cm   LV e' lateral:  8.22 cm/s  LV PW:     1.00 cm   LV E/e' lateral: 5.3  LV IVS:    1.20 cm   LV e' medial:  5.64 cm/s  LVOT diam:   2.10 cm   LV E/e' medial: 7.8  LV SV:     70  LV SV Index:  29  LVOT Area:   3.46 cm    LV Volumes (MOD)  LV vol d, MOD A2C: 117.0 ml  LV vol d, MOD A4C: 104.0 ml  LV vol s, MOD A2C: 57.9 ml  LV vol s, MOD A4C: 55.2 ml  LV SV MOD A2C:   59.1 ml  LV SV MOD A4C:   104.0 ml  LV SV MOD  BP:   55.4 ml   RIGHT VENTRICLE       IVC  RV S prime:   15.70 cm/s IVC diam: 1.80 cm  TAPSE (M-mode): 2.3 cm   LEFT ATRIUM       Index    RIGHT ATRIUM      Index  LA diam:    3.60 cm 1.50 cm/m RA Area:   18.20 cm  LA Vol (A2C):  35.6 ml 14.83 ml/m RA Volume:  55.00 ml 22.91 ml/m  LA Vol (A4C):  71.2 ml 29.65 ml/m  LA Biplane Vol: 54.4 ml 22.66 ml/m  AORTIC VALVE  LVOT Vmax:  107.00 cm/s  LVOT Vmean: 63.400 cm/s  LVOT VTI:  0.201 m    AORTA  Ao Root diam: 3.80 cm  Ao Asc diam: 3.50 cm   MITRAL VALVE  MV Area (PHT): 4.68 cm  SHUNTS  MV Decel Time: 162 msec  Systemic VTI: 0.20 m  MV E velocity: 43.75 cm/s Systemic Diam: 2.10 cm  MV A velocity: 64.70 cm/s  MV E/A ratio: 0.68   Antimicrobials:  Anti-infectives (From admission, onward)   None     Subjective: Seen and examined at bedside and still a little aphasic.  Denies any chest pain, lightheadedness or dizziness.  No other concerns or complaints at this time.  Objective: Vitals:   09/29/19 0842 09/29/19 1150 09/29/19 1538 09/29/19 1700  BP: (!) 156/111 (!) 143/112 (!) 178/114 (!) 163/100  Pulse: 98 (!) 102 82   Resp: 18 18 17    Temp: 97.6 F (36.4 C) 97.7 F (36.5 C) 97.6 F (36.4 C)   TempSrc: Oral Oral Oral   SpO2: 97% 97% 95%   Height:        Intake/Output Summary (Last 24 hours) at 09/29/2019 1832 Last data filed at 09/29/2019 1700 Gross per 24 hour  Intake 759.15 ml  Output 1025 ml  Net -265.85 ml   There were no vitals filed for this visit.  Examination: Physical Exam:  Constitutional: WN/WD obese Caucasian male in NAD and appears calm and comfortable Eyes: Lids and conjunctivae normal, sclerae anicteric  ENMT: External Ears, Nose appear normal. Grossly normal hearing Neck: Appears normal, supple, no cervical masses, normal ROM, no appreciable thyromegaly; no JVD Respiratory: Diminished to auscultation bilaterally, no wheezing, rales,  rhonchi or crackles. Normal respiratory effort and patient is not tachypenic. No accessory muscle use. Unlabored breathing. Cardiovascular: RRR, no murmurs / rubs / gallops. S1 and S2 auscultated. Trace extremity edema.  Abdomen:  Soft, non-tender, Distended 2/2 body habitus. Bowel sounds positive.  GU: Deferred. Musculoskeletal: No clubbing / cyanosis of digits/nails. No joint deformity upper and lower extremities.  Skin: No rashes, lesions, ulcers on a limited skin evaluation. No induration; Warm and dry.  Neurologic: CN 2-12 grossly intact with no focal deficits but has some dysarthria. Romberg sign cerebellar reflexes not assessed.  Psychiatric: Normal judgment and insight. Alert and oriented x 3. Normal mood and appropriate affect.   Data Reviewed: I have personally reviewed following labs and imaging studies  CBC: Recent Labs  Lab 09/28/19 1017 09/28/19 1025 09/29/19 0917  WBC 7.4  --  8.4  NEUTROABS 4.9  --  6.6  HGB 14.6 15.0 14.8  HCT 46.0 44.0 45.3  MCV 92.9  --  90.2  PLT 235  --  814   Basic Metabolic Panel: Recent Labs  Lab 09/28/19 1017 09/28/19 1025 09/29/19 0917  NA 141 142 137  K 4.1 4.1 3.9  CL 109 108 103  CO2 23  --  22  GLUCOSE 165* 158* 161*  BUN 15 18 11   CREATININE 1.18 1.00 0.90  CALCIUM 9.1  --  9.0  MG  --   --  1.3*  PHOS  --   --  2.3*   GFR: CrCl cannot be calculated (Unknown ideal weight.). Liver Function Tests: Recent Labs  Lab 09/28/19 1017 09/29/19 0917  AST 17 17  ALT 15 17  ALKPHOS 73 74  BILITOT 0.9 1.3*  PROT 6.4* 6.4*  ALBUMIN 3.4* 3.4*   No results for input(s): LIPASE, AMYLASE in the last 168 hours. No results for input(s): AMMONIA in the last 168 hours. Coagulation Profile: Recent Labs  Lab 09/28/19 1017  INR 1.0   Cardiac Enzymes: No results for input(s): CKTOTAL, CKMB, CKMBINDEX, TROPONINI in the last 168 hours. BNP (last 3 results) No results for input(s): PROBNP in the last 8760 hours. HbA1C: Recent Labs      09/28/19 1654  HGBA1C 7.1*   CBG: Recent Labs  Lab 09/28/19 1509 09/28/19 1754 09/29/19 0625 09/29/19 1150 09/29/19 1653  GLUCAP 105* 115* 141* 189* 119*   Lipid Profile: Recent Labs    09/29/19 0123  CHOL 122  HDL 49  LDLCALC 54  TRIG 96  CHOLHDL 2.5   Thyroid Function Tests: Recent Labs    09/28/19 1654  TSH 0.746   Anemia Panel: No results for input(s): VITAMINB12, FOLATE, FERRITIN, TIBC, IRON, RETICCTPCT in the last 72 hours. Sepsis Labs: No results for input(s): PROCALCITON, LATICACIDVEN in the last 168 hours.  Recent Results (from the past 240 hour(s))  SARS Coronavirus 2 by RT PCR (hospital order, performed in [facility] hospital lab) Nasopharyngeal Nasopharyngeal Swab     Status: None   Collection Time: 09/28/19 11:33 AM   Specimen: Nasopharyngeal Swab  Result Value Ref Range Status   SARS Coronavirus 2 NEGATIVE NEGATIVE Final    Comment: (NOTE) SARS-CoV-2 target nucleic acids are NOT DETECTED.  The SARS-CoV-2 RNA is generally detectable in upper and lower respiratory specimens during the acute phase of infection. The lowest concentration of SARS-CoV-2 viral copies this assay can detect is 250 copies / mL. A negative result does not preclude SARS-CoV-2 infection and should not be used as the sole basis for treatment or other patient management decisions.  A negative result may occur with improper specimen collection / handling, submission of specimen other than nasopharyngeal swab, presence of viral mutation(s) within the areas targeted by this assay, and inadequate number  of viral copies (<250 copies / mL). A negative result must be combined with clinical observations, patient history, and epidemiological information.  Fact Sheet for Patients:   StrictlyIdeas.no  Fact Sheet for Healthcare Providers: BankingDealers.co.za  This test is not yet approved or  cleared by the [place] FDA and has  been authorized for detection and/or diagnosis of SARS-CoV-2 by FDA under an Emergency Use Authorization (EUA).  This EUA will remain in effect (meaning this test can be used) for the duration of the COVID-19 declaration under Section 564(b)(1) of the Act, 21 U.S.C. section 360bbb-3(b)(1), unless the authorization is terminated or revoked sooner.  Performed at [facility], [place] [address] [place], [place] [address]     RN Pressure Injury Documentation:     Estimated body mass index is 37.87 kg/m as calculated from the following:   Height as of this encounter: 5\' 11"  (1.803 m).   Weight as of 04/19/19: 123.2 kg.  Malnutrition Type:      Malnutrition Characteristics:      Nutrition Interventions:    Radiology Studies: CT ANGIO HEAD W OR WO CONTRAST  Addendum Date: 09/28/2019   ADDENDUM REPORT: 09/28/2019 21:08 ADDENDUM: 2 cm nodule left lobe of thyroid. Thyroid ultrasound recommended. (Ref: J Am Coll Radiol. 2015 Feb;12(2): 143-50). Electronically Signed   By: Franchot Gallo M.D.   On: 09/28/2019 21:08   Result Date: 09/28/2019 CLINICAL DATA:  TIA.  Aphasia. EXAM: CT ANGIOGRAPHY HEAD AND NECK TECHNIQUE: Multidetector CT imaging of the head and neck was performed using the standard protocol during bolus administration of intravenous contrast. Multiplanar CT image reconstructions and MIPs were obtained to evaluate the vascular anatomy. Carotid stenosis measurements (when applicable) are obtained utilizing NASCET criteria, using the distal internal carotid diameter as the denominator. CONTRAST:  70mL OMNIPAQUE IOHEXOL 350 MG/ML SOLN COMPARISON:  MRI head and CT head 09/28/2019 FINDINGS: CTA NECK FINDINGS Aortic arch: Standard branching. Imaged portion shows no evidence of aneurysm or dissection. No significant stenosis of the major arch vessel origins. Right carotid system: Right carotid widely patent without stenosis. Negative for atherosclerotic disease or dissection Left  carotid system: Mild atherosclerotic disease left carotid bifurcation without stenosis. Negative for dissection. Vertebral arteries: Both vertebral arteries are widely patent to the basilar without significant stenosis. Skeleton: Cervical degenerative changes specially in the facet joints bilaterally. No acute skeletal abnormality. Other neck: 2 cm nodule left lobe of the thyroid inferiorly. No adenopathy in the neck. Upper chest: Visualized lung apices clear bilaterally. Superior mediastinum negative. Review of the MIP images confirms the above findings CTA HEAD FINDINGS Anterior circulation: Minimal atherosclerotic calcification in the cavernous carotid bilaterally without stenosis. Anterior and middle cerebral arteries patent bilaterally without stenosis or large vessel occlusion. Posterior circulation: Both vertebral arteries patent to the basilar. Mild atherosclerotic calcification distal right vertebral artery without significant stenosis. Right PICA patent. Left PICA not visualized. Basilar widely patent. AICA, superior cerebellar, and posterior cerebral arteries patent bilaterally without occlusion or significant stenosis. Venous sinuses: Normal venous enhancement Anatomic variants: None Review of the MIP images confirms the above findings IMPRESSION: 1. Negative for intracranial large vessel occlusion or flow limiting stenosis 2. No significant carotid or vertebral artery stenosis in the neck. Minimal atherosclerotic disease. Electronically Signed: By: Franchot Gallo M.D. On: 09/28/2019 16:51   CT ANGIO NECK W OR WO CONTRAST  Addendum Date: 09/28/2019   ADDENDUM REPORT: 09/28/2019 21:08 ADDENDUM: 2 cm nodule left lobe of thyroid. Thyroid ultrasound recommended. (Ref: J Am Coll  Radiol. 2015 Feb;12(2): 143-50). Electronically Signed   By: Franchot Gallo M.D.   On: 09/28/2019 21:08   Result Date: 09/28/2019 CLINICAL DATA:  TIA.  Aphasia. EXAM: CT ANGIOGRAPHY HEAD AND NECK TECHNIQUE: Multidetector CT  imaging of the head and neck was performed using the standard protocol during bolus administration of intravenous contrast. Multiplanar CT image reconstructions and MIPs were obtained to evaluate the vascular anatomy. Carotid stenosis measurements (when applicable) are obtained utilizing NASCET criteria, using the distal internal carotid diameter as the denominator. CONTRAST:  46mL OMNIPAQUE IOHEXOL 350 MG/ML SOLN COMPARISON:  MRI head and CT head 09/28/2019 FINDINGS: CTA NECK FINDINGS Aortic arch: Standard branching. Imaged portion shows no evidence of aneurysm or dissection. No significant stenosis of the major arch vessel origins. Right carotid system: Right carotid widely patent without stenosis. Negative for atherosclerotic disease or dissection Left carotid system: Mild atherosclerotic disease left carotid bifurcation without stenosis. Negative for dissection. Vertebral arteries: Both vertebral arteries are widely patent to the basilar without significant stenosis. Skeleton: Cervical degenerative changes specially in the facet joints bilaterally. No acute skeletal abnormality. Other neck: 2 cm nodule left lobe of the thyroid inferiorly. No adenopathy in the neck. Upper chest: Visualized lung apices clear bilaterally. Superior mediastinum negative. Review of the MIP images confirms the above findings CTA HEAD FINDINGS Anterior circulation: Minimal atherosclerotic calcification in the cavernous carotid bilaterally without stenosis. Anterior and middle cerebral arteries patent bilaterally without stenosis or large vessel occlusion. Posterior circulation: Both vertebral arteries patent to the basilar. Mild atherosclerotic calcification distal right vertebral artery without significant stenosis. Right PICA patent. Left PICA not visualized. Basilar widely patent. AICA, superior cerebellar, and posterior cerebral arteries patent bilaterally without occlusion or significant stenosis. Venous sinuses: Normal venous  enhancement Anatomic variants: None Review of the MIP images confirms the above findings IMPRESSION: 1. Negative for intracranial large vessel occlusion or flow limiting stenosis 2. No significant carotid or vertebral artery stenosis in the neck. Minimal atherosclerotic disease. Electronically Signed: By: Franchot Gallo M.D. On: 09/28/2019 16:51   MR BRAIN WO CONTRAST  Result Date: 09/28/2019 CLINICAL DATA:  Aphasia EXAM: MRI HEAD WITHOUT CONTRAST TECHNIQUE: Multiplanar, multiecho pulse sequences of the brain and surrounding structures were obtained without intravenous contrast. COMPARISON:  04/04/2019 FINDINGS: Brain: There is no acute infarction or intracranial hemorrhage. There is no intracranial mass, mass effect, or edema. There is no hydrocephalus or extra-axial fluid collection. Patchy and confluent areas T2 hyperintensity in the supratentorial matter likely reflect stable moderate chronic microvascular ischemic changes. There are chronic small vessel infarcts of the deep cerebral white matter and thalamus bilaterally. There are multiple foci of susceptibility involving basal ganglia, thalamus cerebral white matter compatible with hypertensive chronic microhemorrhages. Prominence of the ventricles and sulci reflects stable parenchymal volume loss. Vascular: Major vessel flow voids at the skull base are preserved. Skull and upper cervical spine: Normal marrow signal is preserved. Sinuses/Orbits: Trace mucosal thickening.  Orbits are unremarkable. Other: Sella is unremarkable.  Mastoid air cells are clear. IMPRESSION: No evidence of recent infarction, hemorrhage, or mass. Chronic microvascular ischemic changes, chronic small vessel infarcts, and chronic microhemorrhages as seen previously. Electronically Signed   By: Macy Mis M.D.   On: 09/28/2019 13:36   ECHOCARDIOGRAM COMPLETE  Result Date: 09/28/2019    ECHOCARDIOGRAM REPORT   Patient Name:   Keith Carter Date of Exam: 09/28/2019 Medical Rec #:   [id number]  Height:       71.0 in Accession #:    [id number] Weight:  271.5 lb Date of Birth:  Jun 15, 1955   BSA:          2.401 m Patient Age:    55 years   BP:           167/94 mmHg Patient Gender: M          HR:           73 bpm. Exam Location:  Inpatient Procedure: 2D Echo, Cardiac Doppler, Color Doppler and Intracardiac            Opacification Agent Indications:    Stroke  History:        Patient has no prior history of Echocardiogram examinations.                 Stroke; Risk Factors:Hypertension and Diabetes.  Sonographer:    Roseanna Rainbow RDCS Referring Phys: [id number] JENNIFER YATES  Sonographer Comments: Technically difficult study due to poor echo windows and patient is morbidly obese. Image acquisition challenging due to patient body habitus. IMPRESSIONS  1. Left ventricular ejection fraction, by estimation, is 50 to 55%. The left ventricle has low normal function. The left ventricle has no regional wall motion abnormalities. There is mild left ventricular hypertrophy. Left ventricular diastolic parameters are indeterminate.  2. Right ventricular systolic function is normal. The right ventricular size is normal. Tricuspid regurgitation signal is inadequate for assessing PA pressure.  3. The mitral valve is grossly normal. Trivial mitral valve regurgitation.  4. The aortic valve is tricuspid. Aortic valve regurgitation is not visualized. Mild aortic valve sclerosis is present, with no evidence of aortic valve stenosis.  5. The inferior vena cava is normal in size with greater than 50% respiratory variability, suggesting right atrial pressure of 3 mmHg. FINDINGS  Left Ventricle: Left ventricular ejection fraction, by estimation, is 50 to 55%. The left ventricle has low normal function. The left ventricle has no regional wall motion abnormalities. Definity contrast agent was given IV to delineate the left ventricular endocardial borders. The left ventricular internal cavity size was normal in size. There is mild  left ventricular hypertrophy. Left ventricular diastolic parameters are indeterminate. Right Ventricle: The right ventricular size is normal. No increase in right ventricular wall thickness. Right ventricular systolic function is normal. Tricuspid regurgitation signal is inadequate for assessing PA pressure. Left Atrium: Left atrial size was normal in size. Right Atrium: Right atrial size was normal in size. Pericardium: There is no evidence of pericardial effusion. Mitral Valve: The mitral valve is grossly normal. Mild mitral annular calcification. Trivial mitral valve regurgitation. Tricuspid Valve: The tricuspid valve is grossly normal. Tricuspid valve regurgitation is trivial. Aortic Valve: The aortic valve is tricuspid. Aortic valve regurgitation is not visualized. Mild aortic valve sclerosis is present, with no evidence of aortic valve stenosis. Pulmonic Valve: The pulmonic valve was grossly normal. Pulmonic valve regurgitation is not visualized. Aorta: The aortic root is normal in size and structure. Venous: The inferior vena cava is normal in size with greater than 50% respiratory variability, suggesting right atrial pressure of 3 mmHg. IAS/Shunts: No atrial level shunt detected by color flow Doppler.  LEFT VENTRICLE PLAX 2D LVIDd:         4.70 cm      Diastology LVIDs:         3.60 cm      LV e' lateral:   8.22 cm/s LV PW:         1.00 cm      LV E/e' lateral: 5.3 LV IVS:  1.20 cm      LV e' medial:    5.64 cm/s LVOT diam:     2.10 cm      LV E/e' medial:  7.8 LV SV:         70 LV SV Index:   29 LVOT Area:     3.46 cm  LV Volumes (MOD) LV vol d, MOD A2C: 117.0 ml LV vol d, MOD A4C: 104.0 ml LV vol s, MOD A2C: 57.9 ml LV vol s, MOD A4C: 55.2 ml LV SV MOD A2C:     59.1 ml LV SV MOD A4C:     104.0 ml LV SV MOD BP:      55.4 ml RIGHT VENTRICLE             IVC RV S prime:     15.70 cm/s  IVC diam: 1.80 cm TAPSE (M-mode): 2.3 cm LEFT ATRIUM             Index       RIGHT ATRIUM           Index LA diam:         3.60 cm 1.50 cm/m  RA Area:     18.20 cm LA Vol (A2C):   35.6 ml 14.83 ml/m RA Volume:   55.00 ml  22.91 ml/m LA Vol (A4C):   71.2 ml 29.65 ml/m LA Biplane Vol: 54.4 ml 22.66 ml/m  AORTIC VALVE LVOT Vmax:   107.00 cm/s LVOT Vmean:  63.400 cm/s LVOT VTI:    0.201 m  AORTA Ao Root diam: 3.80 cm Ao Asc diam:  3.50 cm MITRAL VALVE MV Area (PHT): 4.68 cm    SHUNTS MV Decel Time: 162 msec    Systemic VTI:  0.20 m MV E velocity: 43.75 cm/s  Systemic Diam: 2.10 cm MV A velocity: 64.70 cm/s MV E/A ratio:  0.68 Rozann Lesches MD Electronically signed by Rozann Lesches MD Signature Date/Time: 09/28/2019/3:55:43 PM    Final    CT HEAD CODE STROKE WO CONTRAST  Result Date: 09/28/2019 CLINICAL DATA:  Code stroke.  Aphasia EXAM: CT HEAD WITHOUT CONTRAST TECHNIQUE: Contiguous axial images were obtained from the base of the skull through the vertex without intravenous contrast. COMPARISON:  04/04/2019 FINDINGS: Brain: No acute intracranial hemorrhage, mass effect, or edema. There is no new loss of gray-white differentiation. Patchy and confluent areas of hypoattenuation in the supratentorial white matter likely reflect stable moderate chronic microvascular ischemic changes. Prominence of the ventricles and sulci reflects stable parenchymal volume loss. Vascular: No hyperdense vessel. There is intracranial atherosclerotic calcification the skull base. Skull: Unremarkable. Sinuses/Orbits: No acute or significant abnormality. Other: Mastoid air cells are clear. ASPECTS [facility] Stroke Program Early CT Score) - Ganglionic level infarction (caudate, lentiform nuclei, internal capsule, insula, M1-M3 cortex): 7 - Supraganglionic infarction (M4-M6 cortex): 3 Total score (0-10 with 10 being normal): 10 IMPRESSION: No acute intracranial hemorrhage. No evidence of acute infarction. ASPECT score is 10. Moderate chronic microvascular ischemic changes. These results were communicated to Dr. Cheral Marker at 10:29 amon 8/28/2021by text  page via the [facility] messaging system. Electronically Signed   By: Macy Mis M.D.   On: 09/28/2019 10:33   Scheduled Meds: . aspirin EC  81 mg Oral Daily  . atorvastatin  40 mg Oral q1800  . enoxaparin (LOVENOX) injection  40 mg Subcutaneous Q24H  . insulin aspart  0-15 Units Subcutaneous TID WC  . insulin aspart  0-5 Units Subcutaneous QHS  . lidocaine  1-3  patch Transdermal Q24H  . PARoxetine  40 mg Oral QHS  . tamsulosin  0.8 mg Oral QHS   Continuous Infusions: . sodium chloride 50 mL/hr at 09/29/19 1221     LOS: 0 days    Kerney Elbe, DO [facility] PAGER is on AMION  If 7PM-7AM, please contact night-coverage [email or web address]

## 2019-09-29 NOTE — Progress Notes (Signed)
Occupational Therapy Evaluation  Difficult to establish prior level of function due to apparent cognitive and language deficits. Pt requires Min A with mobility and Mod A with LB ADL due to deficits listed below. High risk of falls and has had 2 falls PTA. Per pt he is responsible to his and his wife's medication management and was independent with use of a RW. Recommend rehab at SNF due to deficits listed below. If pt declines SNF, he will need 24/7 S and HHOT and [place]. Will follow acutely.   09/29/19 1200  OT Visit Information  Last OT Received On 09/29/19  Assistance Needed +1  History of Present Illness Keith Carter is a 64 y.o. male with medical history significant of CVA (04/2019); HTN; HLD; DM; morbid obesity; BPH; and anxiety presenting with aphasia.  He has had TIAs in the past.  He was admitted in March at [facility] for dysarthria/aphasia.  Since then, he has some memory difficulties and periodic expressive aphasia.  His back has been getting worse and so his mobility has not been as good.  Precautions  Precautions Fall  Precaution Comments Pt reports he has had 2 recent falls  Home Living  Family/patient expects to be discharged to: Private residence  Living Arrangements Spouse/significant other  Available Help at Discharge Available PRN/intermittently;Family;Other (Comment)  Type of Home Mobile home  Home Access Ramped entrance  [place] One level  Bathroom Shower/Tub Walk-in Designer, television/film set Standard  Bathroom Accessibility No  Home Equipment Walker - 4 wheels;Walker - 2 wheels;Cane - single point  Additional Comments Patient states he has access to equipment d/t his wife, but he only utilizes SPC.   Lives With Spouse  Prior Function  Level of Independence Independent with assistive device(s)  Comments Pt reports he does his own dressing and house cleaning.   He calls in grocery orders and has them delivered.  He walks his 2 dogs.  He rports he doesnt physically have to  help his wife (who has Parkinsons); states he gives his wife her medication  Communication  Communication Expressive difficulties  Pain Assessment  Pain Assessment Faces  Faces Pain Scale 2  Pain Location back  Pain Descriptors / Indicators Aching;Discomfort  Pain Intervention(s) Limited activity within patient's tolerance  Cognition  Arousal/Alertness Awake/alert  Behavior During Therapy Flat affect  Overall Cognitive Status Impaired/Different from baseline  Area of Impairment Orientation;Attention;Memory;Following commands;Safety/judgement;Awareness;Problem solving  Orientation Level Disoriented to;Time  Current Attention Level Selective  Memory Decreased recall of precautions;Decreased short-term memory  Following Commands Follows one step commands with increased time  Safety/Judgement Decreased awareness of safety;Decreased awareness of deficits  Awareness Emergent  Problem Solving Slow processing;Decreased initiation  Upper Extremity Assessment  Upper Extremity Assessment Generalized weakness (L appeasr slightly weaker than R)  Lower Extremity Assessment  Lower Extremity Assessment Defer to PT evaluation  Cervical / Trunk Assessment  Cervical / Trunk Assessment Kyphotic  ADL  Overall ADL's  Needs assistance/impaired  Eating/Feeding Modified independent  Grooming Set up;Sitting  Upper Body Bathing Set up;Sitting  Lower Body Bathing Moderate assistance;Sit to/from stand  Upper Body Dressing  Set up;Sitting  Lower Body Dressing Moderate assistance;Sit to/from Retail buyer Minimal assistance;Ambulation;RW  Toileting- Clothing Manipulation and Hygiene Minimal assistance;Sit to/from stand  Functional mobility during ADLs Minimal assistance;Rolling walker;Cueing for safety  General ADL Comments increased difficulty with reaching feet  Vision- Assessment  Additional Comments will further assess  Bed Mobility  Overal bed mobility Needs Assistance  Bed Mobility Supine  to Sit  Supine to sit HOB elevated;Min guard  General bed mobility comments heavy use of rail; sat EOB then fell over onto side x 3 before being able to sit EOB without support  Transfers  Overall transfer level Needs assistance  Equipment used Rolling walker (2 wheeled)  Transfers Sit to/from [facility]  Sit to Stand Min assist  Stand pivot transfers Min guard  Balance  Overall balance assessment Needs assistance;History of Falls  Sitting balance-Leahy Scale Fair  Standing balance-Leahy Scale Poor  OT - End of Session  Equipment Utilized During Treatment Gait belt;Rolling walker  Activity Tolerance Patient tolerated treatment well  Patient left in chair;with call bell/phone within reach;with chair alarm set  Nurse Communication Mobility status  OT Assessment  OT Recommendation/Assessment Patient needs continued OT Services  OT Visit Diagnosis Unsteadiness on feet (R26.81);Other abnormalities of gait and mobility (R26.89);History of falling (Z91.81);Muscle weakness (generalized) (M62.81);Other symptoms and signs involving cognitive function;Pain  Pain - part of body  (back)  OT Problem List Decreased strength;Decreased activity tolerance;Impaired balance (sitting and/or standing);Decreased coordination;Decreased cognition;Decreased safety awareness;Decreased knowledge of use of DME or AE;Decreased knowledge of precautions;Obesity;Pain  OT Plan  OT Frequency (ACUTE ONLY) Min 2X/week  OT Treatment/Interventions (ACUTE ONLY) Self-care/ADL training;Therapeutic exercise;Neuromuscular education;DME and/or AE instruction;Therapeutic activities;Cognitive remediation/compensation;Patient/family education;Balance training  AM-PAC OT "6 Clicks" Daily Activity Outcome Measure (Version 2)  Help from another person eating meals? 4  Help from another person taking care of personal grooming? 3  Help from another person toileting, which includes using toliet, bedpan, or urinal? 3  Help  from another person bathing (including washing, rinsing, drying)? 2  Help from another person to put on and taking off regular upper body clothing? 3  Help from another person to put on and taking off regular lower body clothing? 2  6 Click Score 17  OT Recommendation  Follow Up Recommendations SNF;Supervision/Assistance - 24 hour  OT Equipment 3 in 1 bedside commode  Individuals Consulted  Consulted and Agree with Results and Recommendations Patient unable/family or caregiver not available  Acute Rehab OT Goals  Patient Stated Goal to go home  OT Goal Formulation Patient unable to participate in goal setting  Time For Goal Achievement 10/13/19  Potential to Achieve Goals Good  OT Time Calculation  OT Start Time (ACUTE ONLY) 1128  OT Stop Time (ACUTE ONLY) 1147  OT Time Calculation (min) 19 min  OT General Charges  $OT Visit 1 Visit  OT Evaluation  $OT Eval Moderate Complexity 1 Mod  Written Expression  Dominant Hand Right  Maurie Boettcher, OT/L   Acute OT Clinical Specialist Acute Rehabilitation Services Pager [phone] Office [phone]

## 2019-09-29 NOTE — Evaluation (Signed)
Speech Language Pathology Evaluation Patient Details Name: Keith Carter MRN: [id number] DOB: 1955-05-29 Today's Date: 09/29/2019 Time: 7416-3845 SLP Time Calculation (min) (ACUTE ONLY): 20 min  Problem List:  Patient Active Problem List   Diagnosis Date Noted  . Aphasia 09/28/2019  . Acute CVA (cerebrovascular accident) ([place]) 04/05/2019  . Dysarthria   . Type 2 diabetes mellitus with hyperlipidemia ([place])   . Essential hypertension   . Obesity (BMI 30-39.9)   . Benign prostatic hyperplasia without lower urinary tract symptoms   . Stroke [facility]) 04/04/2019   Past Medical History:  Past Medical History:  Diagnosis Date  . Anxiety   . Dyslipidemia   . Enlarged prostate   . Hypertension   . Prediabetes   . TIA (transient ischemic attack)    Past Surgical History:  Past Surgical History:  Procedure Laterality Date  . BACK SURGERY    . HERNIA REPAIR     HPI:  Keith Carter is a 64 y.o. male with medical history significant of CVA (04/2019); HTN; HLD; DM; morbid obesity; BPH; and anxiety presenting with aphasia.  He has had TIAs in the past.  He was admitted in March at [facility] for dysarthria/aphasia.  Since then, he has some memory difficulties and periodic expressive aphasia.  His back has been getting worse and so his mobility has not been as good.  His daughter had lunch with him yesterday, noticed left foot difficulty with gait but thought this was due to his back.  This AM, his wife called 70 - he wasn't able to remember his dog's name, address, birthday.  He was having trouble putting together sentences.  He feels fine otherwise.  Speech is the primary issue, also memory but not significantly worse since March.  He has fallen a couple of times recently at stores.  MRI from 04/04/19 confirms acute subcortical and deep white matter infarction in the left posterior frontal lobe.  Most recent MRI was showing no evidence of reent infarction, hemorrhage or mass.     Assessment / Plan /  Recommendation Clinical Impression  Cognitive/linguistic evaluation and motor speech screen were completed.  Patient is known to [place] service from admission in March 2021 for CVA.   He had a cognitive/linguistic evaluation and clinical swallowing evaluation completed during that admission.  He was recommended for a regular diet with thin liquids and found to be mildly dysarthric and dysfluent during that evaluation but that no aphasia was noted.  This date he continues to present with mild dysarthria and dysfluencies that appeared to be impacting overall intelligibility.  However, word finding issues/aphasia could not be ruled out especially at the sentence/conversational level.  Cranial nerve exam was completed and remarkable for slight facial weakness on the left and decreased/no sensation noted on his right cheek.  Otherwise, lingual and jaw range of motion and strength appeared adequate.   He was fully oriented to person, place and situation but only partially oriented to time.  He knew the month and day of the week.  He stated the date was near the end of the month and the year as 2001.  He had good immediate recall of 3 novel words but given a short delay was only able to recall 1/3 requiring semantic cue to facilitate recall of the other two novel words.  He appeared to have good attention to task but was unable to accurately complete the presented attention task.  Language skills were grossly intact at a basic level as he was able to  name objects, answer simple questions, follow 3 step commands, read/comprehend a sentence and write a sentence.  However, he appeared to have word finding issues at the conversational level that use of phonemic cues appeared to help.  He was also able to provide logical solutions to simple problems.  He struggled to accurately complete the clock drawing task.  He was able to form a circle but the numbers and hands to the directed time were not place correctly on the clock face.   He was also unable to copy a design.   Per patient he lives at home with his wife who requires assistance due to Parkinson's disease.  He is responsible for medication management and manages their finances.  Given results of this exam suspect patient would require assistance with the management of a household.  ST will contiue to follow at this level of care and he would benefit from [place] therapy at the next level of care.    SLP Assessment  SLP Recommendation/Assessment: Patient needs continued Speech Lanaguage Pathology Services SLP Visit Diagnosis: Aphasia (R47.01)    Follow Up Recommendations  Other (comment) (continued ST at next level of care)    Frequency and Duration min 2x/week  2 weeks      SLP Evaluation Cognition  Overall Cognitive Status: No family/caregiver present to determine baseline cognitive functioning Arousal/Alertness: Awake/alert Orientation Level: Oriented to person;Oriented to place;Oriented to situation;Disoriented to time Attention: Focused Focused Attention: Impaired Focused Attention Impairment: Verbal basic Memory: Impaired Memory Impairment: Decreased recall of new information Problem Solving: Appears intact       Comprehension  Auditory Comprehension Overall Auditory Comprehension: Appears within functional limits for tasks assessed Yes/No Questions: Not tested Commands: Within Functional Limits Conversation: Simple Reading Comprehension Reading Status: Within funtional limits    Expression Expression Primary Mode of Expression: Verbal Verbal Expression Overall Verbal Expression: Appears within functional limits for tasks assessed Initiation: Impaired Automatic Speech: Name;Social Response Level of Generative/Spontaneous Verbalization: Phrase;Sentence Repetition: Impaired Level of Impairment: Sentence level Naming: No impairment Pragmatics: No impairment Non-Verbal Means of Communication: Not applicable Written Expression Dominant Hand:  Right Written Expression: Within Functional Limits   Oral / Motor  Oral Motor/Sensory Function Overall Oral Motor/Sensory Function: Mild impairment Facial ROM: Reduced left Facial Symmetry: Abnormal symmetry left Facial Strength: Within Functional Limits Facial Sensation: Reduced right Lingual ROM: Within Functional Limits Lingual Symmetry: Within Functional Limits Lingual Strength: Within Functional Limits Mandible: Within Functional Limits Motor Speech Overall Motor Speech: Impaired Articulation: Impaired Level of Impairment: Phrase Intelligibility: Intelligibility reduced Word: 75-100% accurate Phrase: 50-74% accurate Sentence: 50-74% accurate Conversation: 50-74% accurate Motor Planning: Not tested   Hart, MA, CCC-SLP Acute Rehab SLP [phone]  Lamar Sprinkles 09/29/2019, 10:41 AM

## 2019-09-30 ENCOUNTER — Inpatient Hospital Stay (HOSPITAL_COMMUNITY): Payer: Medicare HMO

## 2019-09-30 DIAGNOSIS — R569 Unspecified convulsions: Secondary | ICD-10-CM

## 2019-09-30 LAB — CBC WITH DIFFERENTIAL/PLATELET
Abs Immature Granulocytes: 0.04 10*3/uL (ref 0.00–0.07)
Basophils Absolute: 0.1 10*3/uL (ref 0.0–0.1)
Basophils Relative: 1 %
Eosinophils Absolute: 0.2 10*3/uL (ref 0.0–0.5)
Eosinophils Relative: 3 %
HCT: 43.6 % (ref 39.0–52.0)
Hemoglobin: 14.1 g/dL (ref 13.0–17.0)
Immature Granulocytes: 1 %
Lymphocytes Relative: 21 %
Lymphs Abs: 1.9 10*3/uL (ref 0.7–4.0)
MCH: 28.8 pg (ref 26.0–34.0)
MCHC: 32.3 g/dL (ref 30.0–36.0)
MCV: 89 fL (ref 80.0–100.0)
Monocytes Absolute: 0.8 10*3/uL (ref 0.1–1.0)
Monocytes Relative: 9 %
Neutro Abs: 5.8 10*3/uL (ref 1.7–7.7)
Neutrophils Relative %: 65 %
Platelets: 252 10*3/uL (ref 150–400)
RBC: 4.9 MIL/uL (ref 4.22–5.81)
RDW: 13.8 % (ref 11.5–15.5)
WBC: 8.9 10*3/uL (ref 4.0–10.5)
nRBC: 0 % (ref 0.0–0.2)

## 2019-09-30 LAB — COMPREHENSIVE METABOLIC PANEL
ALT: 15 U/L (ref 0–44)
AST: 16 U/L (ref 15–41)
Albumin: 3.2 g/dL — ABNORMAL LOW (ref 3.5–5.0)
Alkaline Phosphatase: 79 U/L (ref 38–126)
Anion gap: 10 (ref 5–15)
BUN: 13 mg/dL (ref 8–23)
CO2: 26 mmol/L (ref 22–32)
Calcium: 9 mg/dL (ref 8.9–10.3)
Chloride: 103 mmol/L (ref 98–111)
Creatinine, Ser: 0.99 mg/dL (ref 0.61–1.24)
GFR calc Af Amer: >60 mL/min (ref >60)
GFR calc non Af Amer: >60 mL/min (ref >60)
Glucose, Bld: 138 mg/dL — ABNORMAL HIGH (ref 70–99)
Potassium: 3.4 mmol/L — ABNORMAL LOW (ref 3.5–5.1)
Sodium: 139 mmol/L (ref 135–145)
Total Bilirubin: 1.2 mg/dL (ref 0.3–1.2)
Total Protein: 6.2 g/dL — ABNORMAL LOW (ref 6.5–8.1)

## 2019-09-30 LAB — PHOSPHORUS: Phosphorus: 3.2 mg/dL (ref 2.5–4.6)

## 2019-09-30 LAB — GLUCOSE, CAPILLARY
Glucose-Capillary: 180 mg/dL — ABNORMAL HIGH (ref 70–99)
Glucose-Capillary: 222 mg/dL — ABNORMAL HIGH (ref 70–99)
Glucose-Capillary: 163 mg/dL — ABNORMAL HIGH (ref 70–99)
Glucose-Capillary: 100 mg/dL — ABNORMAL HIGH (ref 70–99)

## 2019-09-30 LAB — MAGNESIUM: Magnesium: 2 mg/dL (ref 1.7–2.4)

## 2019-09-30 MED ORDER — CLOPIDOGREL BISULFATE 75 MG PO TABS: 75.0000 mg | ORAL_TABLET | Freq: Every day | ORAL | Status: DC

## 2019-09-30 MED ORDER — LEVETIRACETAM 500 MG PO TABS
500.0000 mg | ORAL_TABLET | Freq: Two times a day (BID) | ORAL | Status: DC
  Administered 2019-09-30: 500 mg via ORAL
  Filled 2019-09-30: qty 1

## 2019-09-30 MED ORDER — ASPIRIN 81 MG PO CHEW
81.0000 mg | CHEWABLE_TABLET | Freq: Every day | ORAL | Status: DC
  Administered 2019-10-01 – 2019-10-03 (×3): 81 mg via ORAL
  Filled 2019-09-30 (×3): qty 1

## 2019-09-30 MED ORDER — POTASSIUM CHLORIDE CRYS ER 20 MEQ PO TBCR
40.0000 meq | EXTENDED_RELEASE_TABLET | Freq: Two times a day (BID) | ORAL | Status: AC
  Administered 2019-09-30 (×2): 40 meq via ORAL
  Filled 2019-09-30 (×2): qty 2

## 2019-09-30 MED ORDER — LEVETIRACETAM IN NACL 1000 MG/100ML IV SOLN
1000.0000 mg | Freq: Once | INTRAVENOUS | Status: AC
  Administered 2019-09-30: 1000 mg via INTRAVENOUS
  Filled 2019-09-30: qty 100

## 2019-09-30 NOTE — Progress Notes (Signed)
PROGRESS NOTE    Keith Carter  JJH:[id number] DOB: 12-Dec-1955 DOA: 09/28/2019 PCP: [name], NP   Brief Narrative:  HPI per Dr. Karmen Bongo on 09/28/19 Keith Carter is a 64 y.o. male with medical history significant of CVA (04/2019); HTN; HLD; DM; morbid obesity; BPH; and anxiety presenting with aphasia.  He has had TIAs in the past.  He was admitted in March at [facility] for dysarthria/aphasia.  Since then, he has some memory difficulties and periodic expressive aphasia.  His back has been getting worse and so his mobility has not been as good.  His daughter had lunch with him yesterday, noticed left foot difficulty with gait but thought this was due to his back.  This AM, his wife called 80 - he wasn't able to remember his dog's name, address, birthday.  He was having trouble putting together sentences.  He feels fine otherwise.  Speech is the primary issue, also memory but not significantly worse since March.  He has fallen a couple of times recently at stores.  MRI from 04/04/19 confirms acute subcortical and deep white matter infarction in the left posterior frontal lobe.    ED Course:  H/o CVA in the spring, presented as Code stroke with aphasia, confusion.  Neurology recommends stroke work-up although possibly more encephalopathy.  **Interim History Stroke team neurology to evaluate further work-up revealed that he is likely had a seizure given his EEG.  Patient wanted to go home with home health so neurology started him on IV Keppra however he is going to stay at the urging of his family and likely go to SNF so we will order long-term EEG monitoring.  PT OT recommending SNF but patient wants to go home.  Further recommendations per neurology team.  Assessment & Plan:   Principal Problem:   Aphasia Active Problems:   Dysarthria   Type 2 diabetes mellitus with hyperlipidemia (HCC)   Essential hypertension   Obesity (BMI 30-39.9)   TIA (transient ischemic  attack)  Aphasia/dysarthia, concerning for TIA/CVA and likely was a seizure -Concerning for TIA/CVA but further work-up by the neurologist revealed likely patient is a seizure -Placed in observation status for CVA/TIA evaluation but will change to Inpatient  -C/w Telemetry monitoring -MRI showed "No evidence of recent infarction, hemorrhage, or mass. Chronic microvascular ischemic changes, chronic small vessel infarcts, and chronic microhemorrhages as seen previously." -CTA head/neck showed "Negative for intracranial large vessel occlusion or flow limiting Stenosis  No significant carotid or vertebral artery stenosis in the neck. Minimal atherosclerotic disease." -Echo showed the left ventricular ejection fraction was 50 to 55% with no regional wall motion abnormalities and there was left mild ventricular hypertrophy.  Left ventricular diastolic parameters were indeterminate. -Risk stratification with FLP, A1c; will also check TSH and UDS -Lipid panel done and showed a total cholesterol/HDL ratio of 2.5, cholesterol level 122, HDL of 49, LDL of 54, triglycerides of 96, VLDL 19 -Continue ASA daily, but this appears to be ASA failure and so likely to need DAPT followed by transition to Plavix but will defer to Neurology Stroke Team to initiate; stroke team evaluated and likely he did not have a TIA but likely was a seizure so neurology switch the patient back to aspirin after patient on Plavix -Neurology consulted and Stroke team to still see the patient however need to hospitalist now feels it is not stroke and or TIA and likely was a seizure as the left temporal slowing could potentially explain the event that was  predominant aphasia.  Neuro hospitalist also feels that he does have lobar micrometers potentially concerning for underlying amyloid angiopathy as he also appears to have atrophy with involvement of both temporal lobes and suspect that this may be the nidus for the episodes -Patient received  an EEG which showed "This study is suggestive of nonspecific cortical dysfunction in left temporal region as well as mild diffuse encephalopathy, nonspecific etiology.  No seizures or epileptiform discharges were seen throughout the recording." -Neurology discussed with the patient and because the patient wanted to go home patient was loaded with IV Keppra and started on maintenance Keppra at 500 twice daily.  He was advised not to drive however neurology was recalled given the family had a discussion with the patient he is going to stay and will discontinue his Keppra and do a LTM EEG monitoring -Appreciate neuro recommendations -PT/OT/ST/Nutrition Consults; Recommending SNF; CIR is being recommended by speech therapy -Currently getting IVF with NS at 50 mL/hr  HTN -Allow permissive HTNfor now -Treat BP only if >220/120, and then with goal of 15% reduction -Holdlisinopriland plan to restart in 48-72 hours -BP was 155/102  HLD -Lipid Panel as Above -Continue Lipitor 40 mg daily for now  DM Type 2 -Prior A1c showed reasonably good control; will recheck and was 7.1 -Hold home PO Glucophage -Will order moderate-scale SSI -CBG's ranging from 163-222  Obesity -Estimated body mass index is 37.87 kg/m as calculated from the following:   Height as of this encounter: 5\' 11"  (1.803 m).   Weight as of 04/19/19: 123.2 kg. -Weight loss and Dietary Counseling should be encouraged -Outpatient PCP/bariatric medicine/bariatric surgery f/u encouraged  Hypomagnesemia -Patient's magnesium level is 1.3 proved to 2.0 -Replete with IV mag sulfate 4 g yesterday -Continue to monitor and replete as necessary -Repeat magnesium level in a.m.  Hypophosphatemia -Patient's phosphorus level was 2.3 and improved to 3.2 -Replete with p.o. K-Phos 500 mg x 1 yesterday -Continue monitor and replete as necessary -Repeat phosphorus level in a.m.  Hypokalemia -Patient's potassium was 3.4 -Replete with p.o.  KCl 40 mEq -Continue to monitor and replete as necessary -Repeat CMP in the a.m.  Hyperbilirubinemia -Mild with a T bili going from 0.9 -> 1.3 and today is 1.2 -Continue to monitor and trend and repeat CMP in a.m.   DVT prophylaxis: Enoxaparin 40 mg sq q24h Code Status: DO NOT RESUSCITATE  Family Communication: No family present at bedside discussed with the patient's daughter over the telephone Disposition Plan: PT/OT recommending SNF but patient rather would go home with Home Health   Status is: Inpatient  Remains inpatient appropriate because:Ongoing diagnostic testing needed not appropriate for outpatient work up, Unsafe d/c plan and Inpatient level of care appropriate due to severity of illness   Dispo: The patient is from: Home              Anticipated d/c is to: Home as he is refusing SNF              Anticipated d/c date is: 1 day              Patient currently is not medically stable to d/c.  Consultants:   Neurology   Procedures:  ECHOCARDIOGRAM IMPRESSIONS    1. Left ventricular ejection fraction, by estimation, is 50 to 55%. The  left ventricle has low normal function. The left ventricle has no regional  wall motion abnormalities. There is mild left ventricular hypertrophy.  Left ventricular diastolic  parameters are indeterminate.  2. Right ventricular systolic function is normal. The right ventricular  size is normal. Tricuspid regurgitation signal is inadequate for assessing  PA pressure.  3. The mitral valve is grossly normal. Trivial mitral valve  regurgitation.  4. The aortic valve is tricuspid. Aortic valve regurgitation is not  visualized. Mild aortic valve sclerosis is present, with no evidence of  aortic valve stenosis.  5. The inferior vena cava is normal in size with greater than 50%  respiratory variability, suggesting right atrial pressure of 3 mmHg.   FINDINGS  Left Ventricle: Left ventricular ejection fraction, by estimation, is 50   to 55%. The left ventricle has low normal function. The left ventricle has  no regional wall motion abnormalities. Definity contrast agent was given  IV to delineate the left  ventricular endocardial borders. The left ventricular internal cavity size  was normal in size. There is mild left ventricular hypertrophy. Left  ventricular diastolic parameters are indeterminate.   Right Ventricle: The right ventricular size is normal. No increase in  right ventricular wall thickness. Right ventricular systolic function is  normal. Tricuspid regurgitation signal is inadequate for assessing PA  pressure.   Left Atrium: Left atrial size was normal in size.   Right Atrium: Right atrial size was normal in size.   Pericardium: There is no evidence of pericardial effusion.   Mitral Valve: The mitral valve is grossly normal. Mild mitral annular  calcification. Trivial mitral valve regurgitation.   Tricuspid Valve: The tricuspid valve is grossly normal. Tricuspid valve  regurgitation is trivial.   Aortic Valve: The aortic valve is tricuspid. Aortic valve regurgitation is  not visualized. Mild aortic valve sclerosis is present, with no evidence  of aortic valve stenosis.   Pulmonic Valve: The pulmonic valve was grossly normal. Pulmonic valve  regurgitation is not visualized.   Aorta: The aortic root is normal in size and structure.   Venous: The inferior vena cava is normal in size with greater than 50%  respiratory variability, suggesting right atrial pressure of 3 mmHg.   IAS/Shunts: No atrial level shunt detected by color flow Doppler.     LEFT VENTRICLE  PLAX 2D  LVIDd:     4.70 cm   Diastology  LVIDs:     3.60 cm   LV e' lateral:  8.22 cm/s  LV PW:     1.00 cm   LV E/e' lateral: 5.3  LV IVS:    1.20 cm   LV e' medial:  5.64 cm/s  LVOT diam:   2.10 cm   LV E/e' medial: 7.8  LV SV:     70  LV SV Index:  29  LVOT Area:   3.46 cm    LV  Volumes (MOD)  LV vol d, MOD A2C: 117.0 ml  LV vol d, MOD A4C: 104.0 ml  LV vol s, MOD A2C: 57.9 ml  LV vol s, MOD A4C: 55.2 ml  LV SV MOD A2C:   59.1 ml  LV SV MOD A4C:   104.0 ml  LV SV MOD BP:   55.4 ml   RIGHT VENTRICLE       IVC  RV S prime:   15.70 cm/s IVC diam: 1.80 cm  TAPSE (M-mode): 2.3 cm   LEFT ATRIUM       Index    RIGHT ATRIUM      Index  LA diam:    3.60 cm 1.50 cm/m RA Area:   18.20 cm  LA Vol (A2C):  35.6  ml 14.83 ml/m RA Volume:  55.00 ml 22.91 ml/m  LA Vol (A4C):  71.2 ml 29.65 ml/m  LA Biplane Vol: 54.4 ml 22.66 ml/m  AORTIC VALVE  LVOT Vmax:  107.00 cm/s  LVOT Vmean: 63.400 cm/s  LVOT VTI:  0.201 m    AORTA  Ao Root diam: 3.80 cm  Ao Asc diam: 3.50 cm   MITRAL VALVE  MV Area (PHT): 4.68 cm  SHUNTS  MV Decel Time: 162 msec  Systemic VTI: 0.20 m  MV E velocity: 43.75 cm/s Systemic Diam: 2.10 cm  MV A velocity: 64.70 cm/s  MV E/A ratio: 0.68   EEG This study is suggestive of nonspecific cortical dysfunction in left temporal region as well as mild diffuse encephalopathy, nonspecific etiology.  No seizures or epileptiform discharges were seen throughout the recording.  LTM  Antimicrobials:  Anti-infectives (From admission, onward)   None     Subjective: Seen and examined at bedside and thinks his speech is getting all bit better.  Was wanting to go home but spoke with his family and will stay for further work-up and will be agreeable to going to the skilled nursing facility for short-term.  No chest pain, lightheadedness or dizziness.  No other concerns or plans at this time  Objective: Vitals:   09/30/19 1138 09/30/19 1521 09/30/19 1523 09/30/19 2002  BP: 140/86 (!) 119/105 (!) 128/98 (!) 155/102  Pulse: (!) 104 96 96 71  Resp: 18 19    Temp: 98.8 F (37.1 C) 97.8 F (36.6 C)  98.4 F (36.9 C)  TempSrc: Oral Oral  Oral  SpO2: 97% 98%  96%  Height:        Intake/Output Summary  (Last 24 hours) at 09/30/2019 2010 Last data filed at 09/30/2019 1500 Gross per 24 hour  Intake 720.87 ml  Output 800 ml  Net -79.13 ml   There were no vitals filed for this visit.  Examination: Physical Exam:  Constitutional: WN/WD obese Caucasian male currently in NAD and appears calm and comfortable Eyes: Lids and conjunctivae normal, sclerae anicteric  ENMT: External Ears, Nose appear normal. Grossly normal hearing. Neck: Appears normal, supple, no cervical masses, normal ROM, no appreciable thyromegaly; no JVD Respiratory: Diminished to auscultation bilaterally, no wheezing, rales, rhonchi or crackles. Normal respiratory effort and patient is not tachypenic. No accessory muscle use.  Unlabored breathing Cardiovascular: RRR, no murmurs / rubs / gallops. S1 and S2 auscultated.  Has some mild lower extremity edema Abdomen: Soft, non-tender, distended secondary to body habitus. Bowel sounds positive x4.  GU: Deferred. Musculoskeletal: No clubbing / cyanosis of digits/nails. No joint deformity upper and lower extremities.  Skin: No rashes, lesions, ulcers on limited skin evaluation. No induration; Warm and dry.  Neurologic: CN 2-12 grossly intact with no focal deficits but does have some dysarthria Psychiatric: Normal judgment and insight. Alert and oriented x 3. Normal mood and appropriate affect.   Data Reviewed: I have personally reviewed following labs and imaging studies  CBC: Recent Labs  Lab 09/28/19 1017 09/28/19 1025 09/29/19 0917 09/30/19 0126  WBC 7.4  --  8.4 8.9  NEUTROABS 4.9  --  6.6 5.8  HGB 14.6 15.0 14.8 14.1  HCT 46.0 44.0 45.3 43.6  MCV 92.9  --  90.2 89.0  PLT 235  --  256 557   Basic Metabolic Panel: Recent Labs  Lab 09/28/19 1017 09/28/19 1025 09/29/19 0917 09/30/19 0126  NA 141 142 137 139  K 4.1 4.1 3.9 3.4*  CL  109 108 103 103  CO2 23  --  22 26  GLUCOSE 165* 158* 161* 138*  BUN 15 18 11 13   CREATININE 1.18 1.00 0.90 0.99  CALCIUM 9.1  --   9.0 9.0  MG  --   --  1.3* 2.0  PHOS  --   --  2.3* 3.2   GFR: CrCl cannot be calculated (Unknown ideal weight.). Liver Function Tests: Recent Labs  Lab 09/28/19 1017 09/29/19 0917 09/30/19 0126  AST 17 17 16   ALT 15 17 15   ALKPHOS 73 74 79  BILITOT 0.9 1.3* 1.2  PROT 6.4* 6.4* 6.2*  ALBUMIN 3.4* 3.4* 3.2*   No results for input(s): LIPASE, AMYLASE in the last 168 hours. No results for input(s): AMMONIA in the last 168 hours. Coagulation Profile: Recent Labs  Lab 09/28/19 1017  INR 1.0   Cardiac Enzymes: No results for input(s): CKTOTAL, CKMB, CKMBINDEX, TROPONINI in the last 168 hours. BNP (last 3 results) No results for input(s): PROBNP in the last 8760 hours. HbA1C: Recent Labs    09/28/19 1654  HGBA1C 7.1*   CBG: Recent Labs  Lab 09/29/19 1653 09/29/19 2100 09/30/19 0606 09/30/19 1137 09/30/19 1530  GLUCAP 119* 201* 180* 222* 163*   Lipid Profile: Recent Labs    09/29/19 0123  CHOL 122  HDL 49  LDLCALC 54  TRIG 96  CHOLHDL 2.5   Thyroid Function Tests: Recent Labs    09/28/19 1654  TSH 0.746   Anemia Panel: No results for input(s): VITAMINB12, FOLATE, FERRITIN, TIBC, IRON, RETICCTPCT in the last 72 hours. Sepsis Labs: No results for input(s): PROCALCITON, LATICACIDVEN in the last 168 hours.  Recent Results (from the past 240 hour(s))  SARS Coronavirus 2 by RT PCR (hospital order, performed in [facility] hospital lab) Nasopharyngeal Nasopharyngeal Swab     Status: None   Collection Time: 09/28/19 11:33 AM   Specimen: Nasopharyngeal Swab  Result Value Ref Range Status   SARS Coronavirus 2 NEGATIVE NEGATIVE Final    Comment: (NOTE) SARS-CoV-2 target nucleic acids are NOT DETECTED.  The SARS-CoV-2 RNA is generally detectable in upper and lower respiratory specimens during the acute phase of infection. The lowest concentration of SARS-CoV-2 viral copies this assay can detect is 250 copies / mL. A negative result does not preclude  SARS-CoV-2 infection and should not be used as the sole basis for treatment or other patient management decisions.  A negative result may occur with improper specimen collection / handling, submission of specimen other than nasopharyngeal swab, presence of viral mutation(s) within the areas targeted by this assay, and inadequate number of viral copies (<250 copies / mL). A negative result must be combined with clinical observations, patient history, and epidemiological information.  Fact Sheet for Patients:   StrictlyIdeas.no  Fact Sheet for Healthcare Providers: BankingDealers.co.za  This test is not yet approved or  cleared by the [place] FDA and has been authorized for detection and/or diagnosis of SARS-CoV-2 by FDA under an Emergency Use Authorization (EUA).  This EUA will remain in effect (meaning this test can be used) for the duration of the COVID-19 declaration under Section 564(b)(1) of the Act, 21 U.S.C. section 360bbb-3(b)(1), unless the authorization is terminated or revoked sooner.  Performed at [facility], [place] [address] [place], [place] [address]     RN Pressure Injury Documentation:     Estimated body mass index is 37.87 kg/m as calculated from the following:   Height as of this encounter:  5\' 11"  (1.803 m).   Weight as of 04/19/19: 123.2 kg.  Malnutrition Type:      Malnutrition Characteristics:      Nutrition Interventions:    Radiology Studies: EEG adult  Result Date: 10/13/2019 Lora Havens, MD     October 13, 2019 12:18 PM Patient Name: Emmanuell Kantz MRN: [id number] Epilepsy Attending: Lora Havens Referring Physician/Provider: Dr Donnetta Simpers Date: 13-Oct-2019 Duration: 24.51 mins Patient history: 64 y.o. male with a PMHx of HTN, HLD, DM, morbid obesity, anxiety, BPH, prior stroke and TIA who presents via EMS as a Code Stroke for sudden onset of confusion and memory loss. MR Brain  didn't show any stroke. EEG to evaluate for seizure. Level of alertness: Awake AEDs during EEG study: None Technical aspects: This EEG study was done with scalp electrodes positioned according to the 10-20 International system of electrode placement. Electrical activity was acquired at a sampling rate of 500Hz  and reviewed with a high frequency filter of 70Hz  and a low frequency filter of 1Hz . EEG data were recorded continuously and digitally stored. Description: The posterior dominant rhythm consists of 8-9 Hz activity of moderate voltage (25-35 uV) seen predominantly in posterior head regions, symmetric and reactive to eye opening and eye closing. EEG showed intermittent generalized and maximal left temporal region 3 to 6 Hz theta-delta slowing. Hyperventilation and photic stimulation were not performed.   ABNORMALITY -Intermittent slow, generalized and maximal left temporal region IMPRESSION: This study is suggestive of nonspecific cortical dysfunction in left temporal region as well as mild diffuse encephalopathy, nonspecific etiology.  No seizures or epileptiform discharges were seen throughout the recording. Priyanka O Yadav   Scheduled Meds: . [START ON 10/01/2019] aspirin  81 mg Oral Daily  . atorvastatin  40 mg Oral q1800  . enoxaparin (LOVENOX) injection  40 mg Subcutaneous Q24H  . insulin aspart  0-15 Units Subcutaneous TID WC  . insulin aspart  0-5 Units Subcutaneous QHS  . lidocaine  1-3 patch Transdermal Q24H  . PARoxetine  40 mg Oral QHS  . potassium chloride  40 mEq Oral BID  . tamsulosin  0.8 mg Oral QHS   Continuous Infusions: . sodium chloride 50 mL/hr at 09/29/19 2348     LOS: 1 day    Kerney Elbe, DO [facility] PAGER is on [place]  If 7PM-7AM, please contact night-coverage [email or web address]

## 2019-09-30 NOTE — Progress Notes (Signed)
Nutrition Brief Note  Patient identified on the Malnutrition Screening Tool (MST) Report  Wt Readings from Last 15 Encounters:  04/19/19 123.2 kg  04/04/19 124.1 kg   Keith Carter is a 64 y.o. male with medical history significant of CVA (04/2019); HTN; HLD; DM; morbid obesity; BPH; and anxiety presenting with aphasia.  He has had TIAs in the past.  He was admitted in March at [facility] for dysarthria/aphasia.   Pt admitted with aphasia/ dysarthia, concerning for TIA/CVA.   Pt receiving personal care at time of visit.   Body mass index is 37.87 kg/m. Patient meets criteria for obesity, class II based on current BMI.   Current diet order is heart healthy/ carb modified, patient is consuming approximately 100% of meals at this time. Labs and medications reviewed.   No nutrition interventions warranted at this time. If nutrition issues arise, please consult RD.   Loistine Chance, RD, LDN, [place] Registered Dietitian II Certified Diabetes Care and Education Specialist Please refer to [facility] for RD and/or RD on-call/weekend/after hours pager

## 2019-09-30 NOTE — Progress Notes (Signed)
Name: Keith Carter DOB: 02/20/1955  Please be advised that the above-named patient will require a short-term nursing home stay -- anticipated 30 days or less for rehabilitation and strengthening. The plan is for return home.

## 2019-09-30 NOTE — Progress Notes (Signed)
LTM EEG hooked up and running - no initial skin breakdown - push button tested - neuro notified.  --NEW SET OF LEADS USED FOR [place]--

## 2019-09-30 NOTE — Progress Notes (Signed)
EEG complete - results pending 

## 2019-09-30 NOTE — Progress Notes (Addendum)
NEUROLOGY CONSULTATION NOTE   Date of service: September 30, 2019 Patient Name: Keith Carter MRN:  [id number] DOB:  1955-04-08  Interval Hx  He endorses that this is his second episode with speech difficulty. He states that this started suddenly out of nowhere on saturday. He was completely with it, was awake, alert, knew what was going on. Did not feel foggy in his head or had any difficulty focusing. He was only having problems with language. It was difficult to come up with words. This lasted a couple hours only. This was very similar to the episode back in march when he was told that he had stroke. He feels that his symptoms have significantly improved but he still has some slurring of his speech.  He is eager to go home and is genuinely worried and concerned about his wife who he reports has parkinson's. He takes care of her.  Past History   Past Medical History:  Diagnosis Date  . Anxiety   . Dyslipidemia   . Enlarged prostate   . Hypertension   . Prediabetes   . TIA (transient ischemic attack)    Past Surgical History:  Procedure Laterality Date  . BACK SURGERY    . HERNIA REPAIR     Family History  Problem Relation Age of Onset  . Stroke Neg Hx    Social History   Socioeconomic History  . Marital status: Married    Spouse name: Not on file  . Number of children: Not on file  . Years of education: Not on file  . Highest education level: Not on file  Occupational History  . Occupation: disabled due to back issues  Tobacco Use  . Smoking status: Never Smoker  . Smokeless tobacco: Never Used  Substance and Sexual Activity  . Alcohol use: Yes    Comment: social  . Drug use: Never  . Sexual activity: Not on file  Other Topics Concern  . Not on file  Social History Narrative  . Not on file   Social Determinants of Health   Financial Resource Strain:   . Difficulty of Paying Living Expenses: Not on file  Food Insecurity:   . Worried About Charity fundraiser in the  Last Year: Not on file  . Ran Out of Food in the Last Year: Not on file  Transportation Needs:   . Lack of Transportation (Medical): Not on file  . Lack of Transportation (Non-Medical): Not on file  Physical Activity:   . Days of Exercise per Week: Not on file  . Minutes of Exercise per Session: Not on file  Stress:   . Feeling of Stress : Not on file  Social Connections:   . Frequency of Communication with Friends and Family: Not on file  . Frequency of Social Gatherings with Friends and Family: Not on file  . Attends Religious Services: Not on file  . Active Member of Clubs or Organizations: Not on file  . Attends Archivist Meetings: Not on file  . Marital Status: Not on file   No Known Allergies  Medications   Medications Prior to Admission  Medication Sig Dispense Refill Last Dose  . acetaminophen (TYLENOL) 500 MG tablet Take 500 mg by mouth every 6 (six) hours as needed.    09/27/2019 at Unknown time  . aspirin 81 MG EC tablet Take 81 mg by mouth daily.   09/27/2019 at Unknown time  . atorvastatin (LIPITOR) 40 MG tablet Take 1 tablet (40 mg  total) by mouth daily at 6 PM. 30 tablet 0 09/27/2019 at Unknown time  . celecoxib (CELEBREX) 200 MG capsule Take 200 mg by mouth daily.   09/27/2019 at Unknown time  . Cholecalciferol 25 MCG (1000 UT) tablet Take 5,000 Units by mouth daily.    09/27/2019 at Unknown time  . lisinopril (ZESTRIL) 40 MG tablet Take 40 mg by mouth at bedtime.    09/27/2019 at Unknown time  . metFORMIN (GLUCOPHAGE-XR) 500 MG 24 hr tablet Take 500 mg by mouth 2 (two) times daily.   09/27/2019 at Unknown time  . PARoxetine (PAXIL) 40 MG tablet Take 40 mg by mouth at bedtime.    09/27/2019 at Unknown time  . Potassium 99 MG TABS Take by mouth daily.   09/27/2019 at Unknown time  . sildenafil (REVATIO) 20 MG tablet Take 20 mg by mouth daily as needed.   Past Week at Unknown time  . tamsulosin (FLOMAX) 0.4 MG CAPS capsule Take 0.8 mg by mouth at bedtime.     09/27/2019 at Unknown time  . clopidogrel (PLAVIX) 75 MG tablet Take 1 tablet (75 mg total) by mouth daily. (Patient not taking: Reported on 09/28/2019) 21 tablet 0 Not Taking at Unknown time     Vitals  Temp:  [97.6 F (36.4 C)-98.6 F (37 C)] 98 F (36.7 C) (08/30 0753) Pulse Rate:  [82-102] 85 (08/30 0753) Resp:  [17-18] 18 (08/30 0753) BP: (143-199)/(100-123) 173/111 (08/30 0753) SpO2:  [93 %-97 %] 96 % (08/30 0753)  Body mass index is 37.87 kg/m.  Physical Exam   General: Laying comfortably in bed; in no acute distress.  HENT: Normal oropharynx and mucosa. Normal external appearance of ears and nose. Neck: Supple, no pain or tenderness CV: No JVD. No peripheral edema. Pulmonary: Symmetric Chest rise. Normal respiratory effort. Abdomen: Soft to touch, non-tender Ext: No cyanosis, edema, or deformity  Skin: No rash. Normal palpation of skin.   Musculoskeletal: Normal digits and nails by inspection. No clubbing.  Neurologic Examination  Mental status/Cognition: Alert, oriented to self, place, month and year, good attention. Speech/language: Dysarthric speech, fluent, comprehension intact, object naming intact, repetition intact. Cranial nerves:   CN II Pupils equal and reactive to light, no VF deficits   CN III,IV,VI EOM intact, no gaze preference or deviation, no nystagmu   CN V normal sensation in V1, V2, and V3 segments bilaterally   CN VII Mild nasolabial fold flattening on the left.   CN VIII normal hearing to speech   CN IX & X normal palatal elevation, no uvular deviation   CN XI 5/5 head turn and 5/5 shoulder shrug bilaterally   CN XII midline tongue protrusion   Motor:  Muscle bulk: normal, tone increased diffusely with paratonia and impaired relaxation, pronator drift none Mvmt Root Nerve  Muscle Right Left Comments  SA C5/6 Ax Deltoid 5 5   EF C5/6 Mc Biceps 5 5   EE C6/7/8 Rad Triceps 5 5   WF C6/7 Med FCR 5 5   WE C7/8 PIN ECU 5 5   F Ab C8/T1 U ADM/FDI 5  5   HF L1/2/3 Fem Illopsoas 5 5   KE L2/3/4 Fem Quad 5 5   DF L4/5 D Peron Tib Ant 5 5   PF S1/2 Tibial Grc/Sol 5 5    Reflexes:  Right Left Comments  Pectoralis      Biceps (C5/6) 2 2   Brachioradialis (C5/6) 2 2    Triceps (C6/7) 2  2    Patellar (L3/4) 2 2    Achilles (S1) 1 1    Hoffman      Plantar down up   Jaw jerk    Sensation:  Light touch intact   Pin prick    Temperature    Vibration   Proprioception    Coordination/Complex Motor:  - Finger to Nose with some tremor but no ataxia no past pointing. - Heel to shin with tremor but no ataxia. - Rapid alternating movement normal - Gait: deferred.  Labs   Lab Results  Component Value Date   HGBA1C 7.1 (H) 09/28/2019   Lab Results  Component Value Date   LDLCALC 54 09/29/2019    Imaging and Diagnostic studies  No significant electrolyte abnormality except for potassium of 3.4  Impression   Ralf Konopka is a 64 y.o. male with PMH significant for HTN, HLD, DM, morbid obesity, anxiety, BPH, prior stroke and TIA who presents via EMS as a Code Stroke for sudden onset of aphasia which was out of proportion to encephalopathy, slurring of speech and ?memory loss . His neurologic examination is notable for dysarthric speech, L facial droop, BL tremor and paratonia. I primarily suspect that this could have been a TIA vs recrudescence of prior stroke like symptoms but I do not see any clear metabolic factors that could have contributed to recrudescence. Low suspicion for a seizure given no hx, no clearly identifiable seizure risk factors. However, given that this is the second time he has had an identical episode, will get a routine EEG which is a relatively low risk test.  Recommendations  - I ordered routine EEG - I switched to Plavix 75mg  daily from Aspirin 81mg  daily. Please do not continue Aspirin at discharge. - HbA1c of 7.1, LDL of 54, continue Atorvastatin. - TTE with EF of 50-55%, prior bubble study with no PFO -  Vessel imaging with CTA Head and Neck with no LVO. - MRI Brain with no acute stroke. - Follow up with stroke clinic in 2-3 months at discharge. - Agree with getting social work on board to help with resources needed for caring for patient and his wife. ______________________________________________________________________   Thank you for the opportunity to take part in the care of this patient. If you have any further questions, please contact the neurology consultation attending.  Signed,  [place] Pager Number [phone]

## 2019-09-30 NOTE — Progress Notes (Signed)
Brief Neuro Update:  Patient to stay overnight. I discontinued maintenance Keppra and ordered overnight cEEG.   [place] Pager Number [phone]

## 2019-09-30 NOTE — Progress Notes (Signed)
Speech Language Pathology Treatment: Cognitive-Linquistic  Patient Details Name: Keith Carter MRN: [id number] DOB: 1955/02/05 Today's Date: 09/30/2019 Time: 1000-1045 SLP Time Calculation (min) (ACUTE ONLY): 45 min  Assessment / Plan / Recommendation Clinical Impression  Patient seen to address speech-language and cognitive goals. He stated date as November 11[birth year] 2039 but then was able to choose "2021" from 3 choice cue. He stated place as [place], [place] but was correct with saying "[place]". He correctly stated his DOB, but stated age as 18, then when asked again said, "my forty's". He did not choose his age from 2-choice cue. Patient denied any impairments saying "almost nothing" when asked what deficits he has noticed. He initially denied that he has had any therapy but with semantic cues, he did then mention he had therapy walking in hall. During conversation, patient's speech rate was variable, sometimes too rapid and in addition to dysarthria, required SLP to ask him to repeat. He completed divergent naming task and was able to name 9 fruits in 60 seconds and 8 animals in 60 seconds. He was 100% accurate with repeating phrases and repeating 5 digits. He had difficulty with abstract relationships, such as painting and music to which he stated, "maybe parts of either one go together." During unstructured conversation, he would exhibit phonemic repetition, inconsistent rate of speech and dysarthria. He was able to answer open-ended questions for hypothetical problems but accuracy overall was inconsistent. For waking up late for appointment he said "call and let them know I'd be late" and for seeing a 64-year old child alone on end of pier, "I wouldn't refuse them, I'd try to observe them." Patient did not demonstrate any awareness to errors with his speech or language and did not demonstrate understanding of error of telling SLP he was "64" years old.     HPI HPI: Keith Carter is a 64 y.o. male with medical  history significant of CVA (04/2019); HTN; HLD; DM; morbid obesity; BPH; and anxiety presenting with aphasia.  He has had TIAs in the past.  He was admitted in March at [facility] for dysarthria/aphasia.  Since then, he has some memory difficulties and periodic expressive aphasia.  His back has been getting worse and so his mobility has not been as good.  His daughter had lunch with him yesterday, noticed left foot difficulty with gait but thought this was due to his back.  This AM, his wife called 64 - he wasn't able to remember his dog's name, address, birthday.  He was having trouble putting together sentences.  He feels fine otherwise.  Speech is the primary issue, also memory but not significantly worse since March.  He has fallen a couple of times recently at stores.  MRI from 04/04/19 confirms acute subcortical and deep white matter infarction in the left posterior frontal lobe.  Most recent MRI was showing no evidence of reent infarction, hemorrhage or mass.        SLP Plan  Continue with current plan of care       Recommendations    CIR vs SNF                General recommendations: Rehab consult Follow up Recommendations: 24 hour supervision/assistance;Skilled Nursing facility;Home health SLP SLP Visit Diagnosis: Aphasia (R47.01) Plan: Continue with current plan of care       GO               Sonia Baller, MA, [place] [facility] Pager: [phone]

## 2019-09-30 NOTE — Procedures (Addendum)
Patient Name: Keith Carter  MRN: [id number]  Epilepsy Attending: Lora Havens  Referring Physician/Provider: Dr Donnetta Simpers Date: 09/30/2019 Duration: 24.51 mins  Patient history: 64 y.o. male with a PMHx of HTN, HLD, DM, morbid obesity, anxiety, BPH, prior stroke and TIA who presents via EMS as a Code Stroke for sudden onset of confusion and memory loss. MR Brain didn't show any stroke. EEG to evaluate for seizure.   Level of alertness: Awake  AEDs during EEG study: None  Technical aspects: This EEG study was done with scalp electrodes positioned according to the 10-20 International system of electrode placement. Electrical activity was acquired at a sampling rate of 500Hz  and reviewed with a high frequency filter of 70Hz  and a low frequency filter of 1Hz . EEG data were recorded continuously and digitally stored.   Description: The posterior dominant rhythm consists of 8-9 Hz activity of moderate voltage (25-35 uV) seen predominantly in posterior head regions, symmetric and reactive to eye opening and eye closing. EEG showed intermittent generalized and maximal left temporal region 3 to 6 Hz theta-delta slowing. Hyperventilation and photic stimulation were not performed.     ABNORMALITY -Intermittent slow, generalized and maximal left temporal region  IMPRESSION: This study is suggestive of nonspecific cortical dysfunction in left temporal region as well as mild diffuse encephalopathy, nonspecific etiology.  No seizures or epileptiform discharges were seen throughout the recording.  [name] [name]

## 2019-09-30 NOTE — TOC Initial Note (Signed)
Transition of Care (TOC) - Initial/Assessment Note    Patient Details  Name: Cataldo Varano MRN: [id number] Date of Birth: 04/11/1955  Transition of Care (TOC) CM/SW Contact:     M , LCSW Phone Number: 09/30/2019, 4:32 PM  Clinical Narrative:   CSW met with patient earlier today to discuss SNF, and he was refusing, said that he would go home. CSW received permission to discuss with his daughter, and daughter and spouse came to the hospital this afternoon to discuss with the patient. Family have convinced the patient to go to SNF for short term for rehab, and CSW provided CMS list. Patient's family to review and come up with choices. CSW faxed out referral.                Expected Discharge Plan: Skilled Nursing Facility Barriers to Discharge: Insurance Authorization, Continued Medical Work up   Patient Goals and CMS Choice Patient states their goals for this hospitalization and ongoing recovery are:: patient wants to get home, family wants him to get rehab CMS Medicare.gov Compare Post Acute Care list provided to:: Patient Represenative (must comment) Choice offered to / list presented to : Adult Children, Spouse  Expected Discharge Plan and Services Expected Discharge Plan: Skilled Nursing Facility     Post Acute Care Choice: Skilled Nursing Facility Living arrangements for the past 2 months: Single Family Home                                      Prior Living Arrangements/Services Living arrangements for the past 2 months: Single Family Home Lives with:: Spouse Patient language and need for interpreter reviewed:: No Do you feel safe going back to the place where you live?: Yes      Need for Family Participation in Patient Care: Yes (Comment) Care giver support system in place?: No (comment) Current home services: DME Criminal Activity/Legal Involvement Pertinent to Current Situation/Hospitalization: No - Comment as needed  Activities of Daily Living       Permission Sought/Granted Permission sought to share information with : Facility Contact Representative, Family Supports Permission granted to share information with : Yes, Verbal Permission Granted  Share Information with NAME: Bethany, Sandra  Permission granted to share info w AGENCY: SNF  Permission granted to share info w Relationship: Daughter, Spouse     Emotional Assessment Appearance:: Appears stated age Attitude/Demeanor/Rapport: Engaged Affect (typically observed): Appropriate Orientation: : Oriented to Self, Oriented to Place      Admission diagnosis:  Aphasia [R47.01] TIA (transient ischemic attack) [G45.9] Patient Active Problem List   Diagnosis Date Noted  . TIA (transient ischemic attack) 09/29/2019  . Aphasia 09/28/2019  . Acute CVA (cerebrovascular accident) (HCC) 04/05/2019  . Dysarthria   . Type 2 diabetes mellitus with hyperlipidemia (HCC)   . Essential hypertension   . Obesity (BMI 30-39.9)   . Benign prostatic hyperplasia without lower urinary tract symptoms   . Stroke (HCC) 04/04/2019   PCP:  Gauger, Sarah Kathryn, NP Pharmacy:   [id number] [place] [place] - [address] [place] [address] [place] [address] [place] [address] Phone: [phone] Fax: [phone]     Social Determinants of Health (SDOH) Interventions    Readmission Risk Interventions No flowsheet data found.  

## 2019-09-30 NOTE — NC FL2 (Signed)
[place] MEDICAID FL2 LEVEL OF CARE SCREENING TOOL     IDENTIFICATION  Patient Name: Keith Carter Birthdate: 01/16/1956 Sex: male Admission Date (Current Location): 09/28/2019  [facility] and [place] Number:  Herbalist and Address:  [facility] [place] [place] [address], [place], [place] [address]      Provider Number: [id number]  Attending Physician Name and Address:  Kerney Elbe, DO  Relative Name and Phone Number:       Current Level of Care: Hospital Recommended Level of Care: [place] Prior Approval Number:    Date Approved/Denied:   PASRR Number: Manual review  Discharge Plan: SNF    Current Diagnoses: Patient Active Problem List   Diagnosis Date Noted  . TIA (transient ischemic attack) 09/29/2019  . Aphasia 09/28/2019  . Acute CVA (cerebrovascular accident) ([place]) 04/05/2019  . Dysarthria   . Type 2 diabetes mellitus with hyperlipidemia ([place])   . Essential hypertension   . Obesity (BMI 30-39.9)   . Benign prostatic hyperplasia without lower urinary tract symptoms   . Stroke ([place]) 04/04/2019    Orientation RESPIRATION BLADDER Height & Weight     Self, Place  Normal Continent Weight:   Height:  5\' 11"  (180.3 cm)  BEHAVIORAL SYMPTOMS/MOOD NEUROLOGICAL BOWEL NUTRITION STATUS      Continent Diet (heart healthy, carb modified)  AMBULATORY STATUS COMMUNICATION OF NEEDS Skin   Limited Assist Verbally Normal                       Personal Care Assistance Level of Assistance  Bathing, Feeding, Dressing Bathing Assistance: Limited assistance Feeding assistance: Independent Dressing Assistance: Limited assistance     Functional Limitations Info  Speech     Speech Info: Impaired (dysarthria)    SPECIAL CARE FACTORS FREQUENCY  PT (By licensed PT), OT (By licensed OT), Speech therapy     PT Frequency: 5x/wk OT Frequency: 5x/wk     Speech Therapy Frequency: 5x/wk      Contractures Contractures Info:  Not present    Additional Factors Info  Code Status, Allergies, Psychotropic, Insulin Sliding Scale Code Status Info: DNR Allergies Info: NKA Psychotropic Info: Paxil 40mg  daily at bed Insulin Sliding Scale Info: 0-15 units 3x/day with meals; 0-5 units daily at bed       Current Medications (09/30/2019):  This is the current hospital active medication list Current Facility-Administered Medications  Medication Dose Route Frequency Provider Last Rate Last Admin  . 0.9 %  sodium chloride infusion   Intravenous Continuous Karmen Bongo, MD 50 mL/hr at 09/29/19 2348 Bolus from Bag at 09/29/19 2348  . acetaminophen (TYLENOL) tablet 650 mg  650 mg Oral Q4H PRN Karmen Bongo, MD       Or  . acetaminophen (TYLENOL) 160 MG/5ML solution 650 mg  650 mg Per Tube Q4H PRN Karmen Bongo, MD       Or  . acetaminophen (TYLENOL) suppository 650 mg  650 mg Rectal Q4H PRN Karmen Bongo, MD      . Derrill Memo ON 10/01/2019] aspirin chewable tablet 81 mg  81 mg Oral Daily Donnetta Simpers, MD      . atorvastatin (LIPITOR) tablet 40 mg  40 mg Oral q1800 Karmen Bongo, MD   40 mg at 09/29/19 1738  . enoxaparin (LOVENOX) injection 40 mg  40 mg Subcutaneous Q24H Karmen Bongo, MD   40 mg at 09/30/19 1202  . insulin aspart (novoLOG) injection 0-15 Units  0-15 Units Subcutaneous  TID WC Karmen Bongo, MD   5 Units at 09/30/19 1201  . insulin aspart (novoLOG) injection 0-5 Units  0-5 Units Subcutaneous QHS Karmen Bongo, MD   2 Units at 09/29/19 2143  . lidocaine (LIDODERM) 5 % 1-3 patch  1-3 patch Transdermal Q24H Karmen Bongo, MD   1 patch at 09/30/19 1201  . ondansetron (ZOFRAN) injection 4 mg  4 mg Intravenous Q6H PRN Karmen Bongo, MD      . PARoxetine (PAXIL) tablet 40 mg  40 mg Oral Ivery Quale, MD   40 mg at 09/29/19 2143  . potassium chloride SA (KLOR-CON) CR tablet 40 mEq  40 mEq Oral BID Raiford Noble Bulger, DO   40 mEq at 09/30/19 0841  . senna-docusate (Senokot-S) tablet 1 tablet   1 tablet Oral QHS PRN Karmen Bongo, MD      . tamsulosin [facility]) capsule 0.8 mg  0.8 mg Oral QHS Karmen Bongo, MD   0.8 mg at 09/29/19 2143     Discharge Medications: Please see discharge summary for a list of discharge medications.  Relevant Imaging Results:  Relevant Lab Results:   Additional Information SS#: [id number]  Geralynn Ochs, LCSW

## 2019-09-30 NOTE — Progress Notes (Signed)
Brief Neuro Update:  rEEG with generalized slowing that was maximal in the L temporal lobe. The left temporal slowing could potentially explain the event with predominant aphasia. MRI Brain does not demonstrate any clear structural abnormality in the L temporal lobe. Primr MRI Brain from March 2021 with L subcortical white matter L frontoparietal lobe infarct. 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 I suspect these maybe the nidus for the episodes.  I discussed options with patient regarding 24 hour EEG to monitor for any L temporal epilleptogenic abnormality vs discharge on Keppra with outpatient neurology appointment and EMU admission in the future. At this time, he would like to see [place] outpatient with further workup then.  Recs: - I ordered Keppra 1000mg  IV once - I ordered Keppra 500mg  BID PO - No driving until cleared by neurology outpatient. He should be free of episodes for 6 months. Discused with patient and he agrees with it. - I switched him back to Aspirin 81mg  daily. - Follow up with Neurology outpatient.  [place] Pager Number [phone]

## 2019-09-30 NOTE — Progress Notes (Signed)
Physical Therapy Treatment Patient Details Name: Keith Carter MRN: [id number] DOB: May 16, 1955 Today's Date: 09/30/2019    History of Present Illness Keith Carter is a 64 y.o. male with medical history significant of CVA (04/2019); HTN; HLD; DM; morbid obesity; BPH; and anxiety presenting with aphasia.  He has had TIAs in the past.  He was admitted in March at [facility] for dysarthria/aphasia.  Since then, he has some memory difficulties and periodic expressive aphasia.  His back has been getting worse and so his mobility has not been as good. Neurology consult "TIA vs recrudescence of prior stroke like symptoms... vs seizure." EEG showed slowing left temporal lobe; plans to have 24 hour EEG    PT Comments    Patient reporting he is discharging home today (no indication in his medical record of this). Requires min assist to walk 40 ft with RW with pt running into objects on his right x3 and required min assist to untangle RW from item to allow him to continue walking. Patient fatigued and requested seated rest. Reports he is having incr back pain from a fall ~1 month ago and deferred seated LE exercises as he reported they were increasing his pain. Repeated walking 40 ft with RW with patient running into objects on his rt x 2, however each time he was able to untangle RW from object without physical assist. Would not recommend discharge home unless he has 24/7 min assist.    Follow Up Recommendations  SNF (If pt doesnt want to go SNF - then would rec [facility])     Equipment Recommendations  None recommended by PT    Recommendations for Other Services       Precautions / Restrictions Precautions Precautions: Fall Precaution Comments: Pt reports he has had 2 recent falls Restrictions Weight Bearing Restrictions: No    Mobility  Bed Mobility               General bed mobility comments: Pt up in chair upon arrival  Transfers Overall transfer level: Needs assistance Equipment used: Rolling walker  (2 wheeled) Transfers: Sit to/from Stand Sit to Stand: Min guard         General transfer comment: Requires incr time--pt reports due to back pain  Ambulation/Gait Ambulation/Gait assistance: Min assist Gait Distance (Feet): 40 Feet (x2; seated rest between) Assistive device: Rolling walker (2 wheeled) Gait Pattern/deviations: Trunk flexed;Decreased stride length;Shuffle     General Gait Details: poor rt foot clearance which worsened as he fatigued (to the point of nearly sliding Rt foot along floor to advance RLE); x3 required cues to figure out that he was not able to walk forward because rt wheels of RW were stuck on a nearby piece of furniture;4th and 5th times he still ran into objects on his rt but able to correct the RW and proceed.   Stairs             Wheelchair Mobility    Modified Rankin (Stroke Patients Only)       Balance Overall balance assessment: Needs assistance;History of Falls Sitting-balance support: No upper extremity supported;Feet supported Sitting balance-Leahy Scale: Fair (>fair NT)   Postural control: Posterior lean Standing balance support: Bilateral upper extremity supported Standing balance-Leahy Scale: Poor                              Cognition Arousal/Alertness: Awake/alert Behavior During Therapy: WFL for tasks assessed/performed Overall Cognitive Status: Impaired/Different from baseline  Area of Impairment: Orientation;Attention;Memory;Following commands;Safety/judgement;Awareness;Problem solving                 Orientation Level: Disoriented to;Time;Place Current Attention Level: Selective Memory: Decreased recall of precautions;Decreased short-term memory Following Commands: Follows one step commands with increased time Safety/Judgement: Decreased awareness of safety;Decreased awareness of deficits Awareness: Emergent Problem Solving: Slow processing;Requires verbal cues General Comments: After pointed out  x 3 that he was running into objects on his rt with rt wheels, he began to notice when he ran into something and correct walker to go around      Exercises      General Comments General comments (skin integrity, edema, etc.): Able to correctly state that his LLE is stronger than his RLE. Reports he is running into objects on his rt due to the RW being difficult to use (his room does have a bariatric/heavier RW).       Pertinent Vitals/Pain Pain Assessment: Faces Faces Pain Scale: Hurts a little bit Pain Location: back Pain Descriptors / Indicators: Aching;Discomfort Pain Intervention(s): Limited activity within patient's tolerance;Monitored during session;Repositioned    Home Living                      Prior Function            PT Goals (current goals can now be found in the care plan section) Acute Rehab PT Goals Patient Stated Goal: to go home Time For Goal Achievement: 10/11/19 Potential to Achieve Goals: Good Progress towards PT goals: Progressing toward goals    Frequency    Min 3X/week      PT Plan Current plan remains appropriate    Co-evaluation              AM-PAC PT "6 Clicks" Mobility   Outcome Measure  Help needed turning from your back to your side while in a flat bed without using bedrails?: A Little Help needed moving from lying on your back to sitting on the side of a flat bed without using bedrails?: A Little Help needed moving to and from a bed to a chair (including a wheelchair)?: A Little Help needed standing up from a chair using your arms (e.g., wheelchair or bedside chair)?: A Little Help needed to walk in hospital room?: A Little Help needed climbing 3-5 steps with a railing? : A Lot 6 Click Score: 17    End of Session Equipment Utilized During Treatment: Gait belt Activity Tolerance: Patient tolerated treatment well;Patient limited by fatigue Patient left: in chair;with chair alarm set;with call bell/phone within  reach Nurse Communication: Mobility status PT Visit Diagnosis: Unsteadiness on feet (R26.81);Repeated falls (R29.6);Difficulty in walking, not elsewhere classified (R26.2);Muscle weakness (generalized) (M62.81)     Time: 2119-4174 PT Time Calculation (min) (ACUTE ONLY): 27 min  Charges:  $Gait Training: 23-37 mins                      Arby Barrette, PT Pager [phone]    Rexanne Mano 09/30/2019, 4:51 PM

## 2019-09-30 NOTE — Plan of Care (Signed)
  Problem: Education: Goal: Knowledge of General Education information will improve Description: Including pain rating scale, medication(s)/side effects and non-pharmacologic comfort measures Outcome: Progressing   Problem: Safety: Goal: Ability to remain free from injury will improve Outcome: Progressing   

## 2019-09-30 NOTE — Progress Notes (Signed)
Occupational Therapy Treatment Patient Details Name: Keith Carter MRN: [id number] DOB: 1955-05-02 Today's Date: 09/30/2019    History of present illness Keith Carter is a 64 y.o. male with medical history significant of CVA (04/2019); HTN; HLD; DM; morbid obesity; BPH; and anxiety presenting with aphasia.  He has had TIAs in the past.  He was admitted in March at [facility] for dysarthria/aphasia.  Since then, he has some memory difficulties and periodic expressive aphasia.  His back has been getting worse and so his mobility has not been as good.   OT comments  Patient continues to make progress towards goals in skilled OT session. Patient's session encompassed functional mobility, further assessment of cognition, and ADLs at the sink. Pt minimally disoriented (unaware of date) in session, however able to state where he was appropriately and that he is taking care of his wife at home. Pt also admits to intermittent RW use, acknowledging that this is an unsafe practice, but feels confident he can navigate around his double wide to take care of his wife. Pt presenting with shuffled gait and posterior bias when ambulating, requiring close min A for safety. Pt heavily reliant on RW to date, and fatiguing quickly. Due to cognition, duties needed of pt at home, and overall weakness therapist continues to recommend short term SNF placement prior to discharge home; will continue to follow acutely.    Follow Up Recommendations  SNF;Supervision/Assistance - 24 hour    Equipment Recommendations  3 in 1 bedside commode    Recommendations for Other Services      Precautions / Restrictions Precautions Precautions: Fall Precaution Comments: Pt reports he has had 2 recent falls Restrictions Weight Bearing Restrictions: No       Mobility Bed Mobility               General bed mobility comments: Pt up in chair upon arrival  Transfers Overall transfer level: Needs assistance Equipment used: Rolling walker  (2 wheeled) Transfers: Sit to/from Stand Sit to Stand: Min assist         General transfer comment: Cues for appropriate walker management needed throughout    Balance Overall balance assessment: Needs assistance;History of Falls   Sitting balance-Leahy Scale: Fair   Postural control: Posterior lean   Standing balance-Leahy Scale: Poor                             ADL either performed or assessed with clinical judgement   ADL Overall ADL's : Needs assistance/impaired     Grooming: Wash/dry hands;Wash/dry face;Oral care;Brushing hair;Standing;Minimal assistance Grooming Details (indicate cue type and reason): Standing at sink, min A due to posterior lean                             Functional mobility during ADLs: Minimal assistance;Rolling walker;Cueing for safety;Cueing for sequencing General ADL Comments: Shuffled gait with increased dependence on RW, though admits he uses furniture in his double wide to brace himself     Vision       Perception     Praxis      Cognition Arousal/Alertness: Awake/alert Behavior During Therapy: WFL for tasks assessed/performed Overall Cognitive Status: Impaired/Different from baseline Area of Impairment: Orientation;Attention;Memory;Following commands;Safety/judgement;Awareness;Problem solving                 Orientation Level: Disoriented to;Time Current Attention Level: Selective Memory: Decreased recall of precautions;Decreased short-term memory  Following Commands: Follows one step commands with increased time Safety/Judgement: Decreased awareness of safety;Decreased awareness of deficits Awareness: Emergent Problem Solving: Slow processing;Decreased initiation;Requires verbal cues          Exercises     Shoulder Instructions       General Comments      Pertinent Vitals/ Pain       Pain Assessment: Faces Faces Pain Scale: No hurt  Home Living                                           Prior Functioning/Environment              Frequency  Min 2X/week        Progress Toward Goals  OT Goals(current goals can now be found in the care plan section)  Progress towards OT goals: Progressing toward goals  Acute Rehab OT Goals Patient Stated Goal: to go home OT Goal Formulation: With patient Time For Goal Achievement: 10/13/19 Potential to Achieve Goals: Good  Plan Discharge plan remains appropriate    Co-evaluation                 AM-PAC OT "6 Clicks" Daily Activity     Outcome Measure   Help from another person eating meals?: None Help from another person taking care of personal grooming?: A Little Help from another person toileting, which includes using toliet, bedpan, or urinal?: A Little Help from another person bathing (including washing, rinsing, drying)?: A Lot Help from another person to put on and taking off regular upper body clothing?: A Little Help from another person to put on and taking off regular lower body clothing?: A Lot 6 Click Score: 17    End of Session Equipment Utilized During Treatment: Gait belt;Rolling walker  OT Visit Diagnosis: Unsteadiness on feet (R26.81);Other abnormalities of gait and mobility (R26.89);History of falling (Z91.81);Muscle weakness (generalized) (M62.81);Other symptoms and signs involving cognitive function;Pain   Activity Tolerance Patient tolerated treatment well   Patient Left in chair;with call bell/phone within reach;with chair alarm set   Nurse Communication Mobility status        Time: 1150-1206 OT Time Calculation (min): 16 min  Charges: OT General Charges $OT Visit: 1 Visit OT Treatments $Self Care/Home Management : 8-22 mins  Corinne Ports E. Chery Giusto, COTA/L Acute Rehabilitation Services [place] 09/30/2019, 2:31 PM

## 2019-10-01 LAB — COMPREHENSIVE METABOLIC PANEL
ALT: 15 U/L (ref 0–44)
AST: 17 U/L (ref 15–41)
Albumin: 3.1 g/dL — ABNORMAL LOW (ref 3.5–5.0)
Alkaline Phosphatase: 77 U/L (ref 38–126)
Anion gap: 9 (ref 5–15)
BUN: 17 mg/dL (ref 8–23)
CO2: 22 mmol/L (ref 22–32)
Calcium: 8.8 mg/dL — ABNORMAL LOW (ref 8.9–10.3)
Chloride: 108 mmol/L (ref 98–111)
Creatinine, Ser: 0.99 mg/dL (ref 0.61–1.24)
GFR calc Af Amer: >60 mL/min (ref >60)
GFR calc non Af Amer: >60 mL/min (ref >60)
Glucose, Bld: 142 mg/dL — ABNORMAL HIGH (ref 70–99)
Potassium: 4.5 mmol/L (ref 3.5–5.1)
Sodium: 139 mmol/L (ref 135–145)
Total Bilirubin: 1.1 mg/dL (ref 0.3–1.2)
Total Protein: 6.2 g/dL — ABNORMAL LOW (ref 6.5–8.1)

## 2019-10-01 LAB — PHOSPHORUS: Phosphorus: 3.2 mg/dL (ref 2.5–4.6)

## 2019-10-01 LAB — CBC WITH DIFFERENTIAL/PLATELET
Abs Immature Granulocytes: 0.04 10*3/uL (ref 0.00–0.07)
Basophils Absolute: 0.1 10*3/uL (ref 0.0–0.1)
Basophils Relative: 1 %
Eosinophils Absolute: 0.3 10*3/uL (ref 0.0–0.5)
Eosinophils Relative: 4 %
HCT: 43 % (ref 39.0–52.0)
Hemoglobin: 13.8 g/dL (ref 13.0–17.0)
Immature Granulocytes: 1 %
Lymphocytes Relative: 21 %
Lymphs Abs: 1.8 10*3/uL (ref 0.7–4.0)
MCH: 28.9 pg (ref 26.0–34.0)
MCHC: 32.1 g/dL (ref 30.0–36.0)
MCV: 90 fL (ref 80.0–100.0)
Monocytes Absolute: 0.7 10*3/uL (ref 0.1–1.0)
Monocytes Relative: 9 %
Neutro Abs: 5.2 10*3/uL (ref 1.7–7.7)
Neutrophils Relative %: 64 %
Platelets: 230 10*3/uL (ref 150–400)
RBC: 4.78 MIL/uL (ref 4.22–5.81)
RDW: 14 % (ref 11.5–15.5)
WBC: 8.2 10*3/uL (ref 4.0–10.5)
nRBC: 0 % (ref 0.0–0.2)

## 2019-10-01 LAB — GLUCOSE, CAPILLARY
Glucose-Capillary: 145 mg/dL — ABNORMAL HIGH (ref 70–99)
Glucose-Capillary: 186 mg/dL — ABNORMAL HIGH (ref 70–99)
Glucose-Capillary: 108 mg/dL — ABNORMAL HIGH (ref 70–99)
Glucose-Capillary: 225 mg/dL — ABNORMAL HIGH (ref 70–99)

## 2019-10-01 LAB — MAGNESIUM: Magnesium: 1.8 mg/dL (ref 1.7–2.4)

## 2019-10-01 MED ORDER — HYDRALAZINE HCL 20 MG/ML IJ SOLN
10.0000 mg | Freq: Four times a day (QID) | INTRAMUSCULAR | Status: DC | PRN
  Administered 2019-10-01: 10 mg via INTRAVENOUS
  Filled 2019-10-01: qty 1

## 2019-10-01 MED ORDER — MAGNESIUM SULFATE 2 GM/50ML IV SOLN
2.0000 g | Freq: Once | INTRAVENOUS | Status: AC
  Administered 2019-10-01: 2 g via INTRAVENOUS
  Filled 2019-10-01: qty 50

## 2019-10-01 MED ORDER — LISINOPRIL 20 MG PO TABS
40.0000 mg | ORAL_TABLET | Freq: Every day | ORAL | Status: DC
  Administered 2019-10-01 – 2019-10-02 (×2): 40 mg via ORAL
  Filled 2019-10-01 (×2): qty 2

## 2019-10-01 NOTE — Progress Notes (Signed)
Physical Therapy Treatment Patient Details Name: Keith Carter MRN: [id number] DOB: 1955/09/06 Today's Date: 10/01/2019    History of Present Illness Keith Carter is a 64 y.o. male with medical history significant of CVA (04/2019); HTN; HLD; DM; morbid obesity; BPH; and anxiety presenting with aphasia.  He has had TIAs in the past.  He was admitted in March at [facility] for dysarthria/aphasia.  Since then, he has some memory difficulties and periodic expressive aphasia.  His back has been getting worse and so his mobility has not been as good. Neurology consult "TIA vs recrudescence of prior stroke like symptoms... vs seizure." EEG showed slowing left temporal lobe; plans to have 24 hour EEG    PT Comments    Patient hooked to 24 hr EEG during session. RN described increased difficulty with transfer to [facility] on evening of 8/30 (after able to ambulate 40 ft x 2 with PT earlier in day). Agreed to assess pt with mobility while undergoing EEG to assess if has had decline in status. Patient was able to perform sit to stand, pivot, and even walking short distance with minguard assist (as he did previously). Updated RN that did not observe a significant difference today compared to 8/30. He remains disoriented to time (day, month, or year).     Follow Up Recommendations  SNF (If pt refuses SNF - then would rec [facility])     Equipment Recommendations  None recommended by PT    Recommendations for Other Services       Precautions / Restrictions Precautions Precautions: Fall Precaution Comments: Pt reports he has had 2 recent falls    Mobility  Bed Mobility Overal bed mobility: Needs Assistance Bed Mobility: Rolling;Sidelying to Sit;Sit to Supine Rolling: Modified independent (Device/Increase time) (with rail) Sidelying to sit: Min assist (no rail, HOB 0; INCR time and effort)   Sit to supine: Min guard   General bed mobility comments: pt lying down diagonally with close guarding due to EEG wires, telemetry,  and IV; cues for getting himself straight  Transfers Overall transfer level: Needs assistance Equipment used: Rolling walker (2 wheeled) Transfers: Sit to/from Stand Sit to Stand: Min guard Stand pivot transfers: Min guard       General transfer comment: slow processing with incr time; also incr time due to multiple lines and PT cuing to maintain line safety  Ambulation/Gait Ambulation/Gait assistance: Min guard Gait Distance (Feet): 14 Feet Assistive device: Rolling walker (2 wheeled) Gait Pattern/deviations: Trunk flexed;Decreased stride length;Shuffle Gait velocity: decr   General Gait Details: limited distance due to multiple lines (including EEG); poor rt foot clearance, although shuffles both feet; used regular RW (not the bariatric one in his room 8/30) and he did better with maneuvering in tight spaces (could not practice same route as 8/30 due to EEG wires)   Stairs             Wheelchair Mobility    Modified Rankin (Stroke Patients Only)       Balance Overall balance assessment: Needs assistance;History of Falls Sitting-balance support: No upper extremity supported;Feet supported Sitting balance-Leahy Scale: Fair (>fair NT)     Standing balance support: Bilateral upper extremity supported Standing balance-Leahy Scale: Poor Standing balance comment: can stand for up to 15 sec statically without holding onto RW with close-guarding                            Cognition Arousal/Alertness: Awake/alert Behavior During Therapy: [facility] for  tasks assessed/performed Overall Cognitive Status: No family/caregiver present to determine baseline cognitive functioning Area of Impairment: Orientation;Attention;Memory;Following commands;Safety/judgement;Awareness;Problem solving                 Orientation Level: Disoriented to;Time (October, 2012, Thurs--max cues to get to correct answers) Current Attention Level: Selective Memory: Decreased recall of  precautions;Decreased short-term memory (RN reports trying to get OOB over night) Following Commands: Follows one step commands with increased time Safety/Judgement: Decreased awareness of safety;Decreased awareness of deficits Awareness: Emergent Problem Solving: Slow processing;Requires verbal cues        Exercises Other Exercises Other Exercises: Pre-gait training for improved foot clearance: standing marching with bil UE support via RW; step frward and backward with alternating legs; ankle dorsiflexion unilaterally (in standing) 5 reps each exercise    General Comments General comments (skin integrity, edema, etc.): Did not recall incident with RN yesterday when he had difficulty advancing his feet despite multimodal cues; did recall difficulty he had maneuvering bariatric RW yesterday and was happy to have a standard size RW      Pertinent Vitals/Pain Pain Assessment: No/denies pain    Home Living                      Prior Function            PT Goals (current goals can now be found in the care plan section) Acute Rehab PT Goals Patient Stated Goal: to go home Time For Goal Achievement: 10/11/19 Potential to Achieve Goals: Good Progress towards PT goals: Progressing toward goals    Frequency    Min 3X/week      PT Plan Current plan remains appropriate    Co-evaluation              AM-PAC PT "6 Clicks" Mobility   Outcome Measure  Help needed turning from your back to your side while in a flat bed without using bedrails?: A Little Help needed moving from lying on your back to sitting on the side of a flat bed without using bedrails?: A Little Help needed moving to and from a bed to a chair (including a wheelchair)?: A Little Help needed standing up from a chair using your arms (e.g., wheelchair or bedside chair)?: A Little Help needed to walk in hospital room?: A Little Help needed climbing 3-5 steps with a railing? : A Lot 6 Click Score: 17     End of Session Equipment Utilized During Treatment: Gait belt Activity Tolerance: Patient tolerated treatment well Patient left: with call bell/phone within reach;in bed;with bed alarm set Nurse Communication: Mobility status PT Visit Diagnosis: Unsteadiness on feet (R26.81);Repeated falls (R29.6);Difficulty in walking, not elsewhere classified (R26.2);Muscle weakness (generalized) (M62.81)     Time: 4765-4650 PT Time Calculation (min) (ACUTE ONLY): 36 min  Charges:  $Gait Training: 23-37 mins                      Arby Barrette, PT Pager [phone]    Rexanne Mano 10/01/2019, 10:50 AM

## 2019-10-01 NOTE — Plan of Care (Signed)
  Problem: Education: Goal: Knowledge of General Education information will improve Description Including pain rating scale, medication(s)/side effects and non-pharmacologic comfort measures Outcome: Progressing   

## 2019-10-01 NOTE — Procedures (Addendum)
Patient Name: Keith Carter  MRN: [id number]  Epilepsy Attending: Lora Havens  Referring Physician/Provider: Dr Donnetta Simpers Duration: 09/30/2019 1605 to 10/01/2019 1338  Patient history: 64 y.o.malewith a PMHx of HTN, HLD, DM, morbid obesity, anxiety, BPH, prior stroke and TIA who presents via EMS as a Code Stroke for sudden onset of confusion and memory loss. MR Brain didn't show any stroke. EEG to evaluate for seizure.   Level of alertness: Awake, asleep  AEDs during EEG study: None  Technical aspects: This EEG study was done with scalp electrodes positioned according to the 10-20 International system of electrode placement. Electrical activity was acquired at a sampling rate of 500Hz  and reviewed with a high frequency filter of 70Hz  and a low frequency filter of 1Hz . EEG data were recorded continuously and digitally stored.   Description: The posterior dominant rhythm consists of 8-9 Hz activity of moderate voltage (25-35 uV) seen predominantly in posterior head regions, symmetric and reactive to eye opening and eye closing. Hyperventilation and photic stimulation were not performed.     IMPRESSION: This study is within normal limits. No seizures or epileptiform discharges were seen throughout the recording.  [name] [name]

## 2019-10-01 NOTE — Progress Notes (Signed)
NEUROLOGY CONSULTATION NOTE   Date of service: October 01, 2019 Patient Name: Keith Carter MRN:  [id number] DOB:  November 22, 1955  Interval Hx  Continue to have slurred speech. cEEG with no epileptiform discharges so far.  Past History   Past Medical History:  Diagnosis Date  . Anxiety   . Dyslipidemia   . Enlarged prostate   . Hypertension   . Prediabetes   . TIA (transient ischemic attack)    Past Surgical History:  Procedure Laterality Date  . BACK SURGERY    . HERNIA REPAIR     Family History  Problem Relation Age of Onset  . Stroke Neg Hx    Social History   Socioeconomic History  . Marital status: Married    Spouse name: Not on file  . Number of children: Not on file  . Years of education: Not on file  . Highest education level: Not on file  Occupational History  . Occupation: disabled due to back issues  Tobacco Use  . Smoking status: Never Smoker  . Smokeless tobacco: Never Used  Substance and Sexual Activity  . Alcohol use: Yes    Comment: social  . Drug use: Never  . Sexual activity: Not on file  Other Topics Concern  . Not on file  Social History Narrative  . Not on file   Social Determinants of Health   Financial Resource Strain:   . Difficulty of Paying Living Expenses: Not on file  Food Insecurity:   . Worried About Charity fundraiser in the Last Year: Not on file  . Ran Out of Food in the Last Year: Not on file  Transportation Needs:   . Lack of Transportation (Medical): Not on file  . Lack of Transportation (Non-Medical): Not on file  Physical Activity:   . Days of Exercise per Week: Not on file  . Minutes of Exercise per Session: Not on file  Stress:   . Feeling of Stress : Not on file  Social Connections:   . Frequency of Communication with Friends and Family: Not on file  . Frequency of Social Gatherings with Friends and Family: Not on file  . Attends Religious Services: Not on file  . Active Member of Clubs or Organizations: Not on  file  . Attends Archivist Meetings: Not on file  . Marital Status: Not on file   No Known Allergies  Medications   Medications Prior to Admission  Medication Sig Dispense Refill Last Dose  . acetaminophen (TYLENOL) 500 MG tablet Take 500 mg by mouth every 6 (six) hours as needed.    09/27/2019 at Unknown time  . aspirin 81 MG EC tablet Take 81 mg by mouth daily.   09/27/2019 at Unknown time  . atorvastatin (LIPITOR) 40 MG tablet Take 1 tablet (40 mg total) by mouth daily at 6 PM. 30 tablet 0 09/27/2019 at Unknown time  . celecoxib (CELEBREX) 200 MG capsule Take 200 mg by mouth daily.   09/27/2019 at Unknown time  . Cholecalciferol 25 MCG (1000 UT) tablet Take 5,000 Units by mouth daily.    09/27/2019 at Unknown time  . lisinopril (ZESTRIL) 40 MG tablet Take 40 mg by mouth at bedtime.    09/27/2019 at Unknown time  . metFORMIN (GLUCOPHAGE-XR) 500 MG 24 hr tablet Take 500 mg by mouth 2 (two) times daily.   09/27/2019 at Unknown time  . PARoxetine (PAXIL) 40 MG tablet Take 40 mg by mouth at bedtime.    09/27/2019  at Unknown time  . Potassium 99 MG TABS Take by mouth daily.   09/27/2019 at Unknown time  . sildenafil (REVATIO) 20 MG tablet Take 20 mg by mouth daily as needed.   Past Week at Unknown time  . tamsulosin (FLOMAX) 0.4 MG CAPS capsule Take 0.8 mg by mouth at bedtime.    09/27/2019 at Unknown time  . clopidogrel (PLAVIX) 75 MG tablet Take 1 tablet (75 mg total) by mouth daily. (Patient not taking: Reported on 09/28/2019) 21 tablet 0 Not Taking at Unknown time     Vitals  Temp:  [97.5 F (36.4 C)-98.8 F (37.1 C)] 97.6 F (36.4 C) (08/31 0756) Pulse Rate:  [71-104] 74 (08/31 0756) Resp:  [18-20] 20 (08/31 0756) BP: (119-169)/(86-114) 169/104 (08/31 0756) SpO2:  [95 %-98 %] 97 % (08/31 0756)  Body mass index is 37.87 kg/m.  Physical Exam   General: Laying comfortably in bed; in no acute distress.  HENT: Normal oropharynx and mucosa. Normal external appearance of ears and  nose. Neck: Supple, no pain or tenderness CV: No JVD. No peripheral edema. Pulmonary: Symmetric Chest rise. Normal respiratory effort. Abdomen: Soft to touch, non-tender Ext: No cyanosis, edema, or deformity  Skin: No rash. Normal palpation of skin.   Musculoskeletal: Normal digits and nails by inspection. No clubbing.  Neurologic Examination  Mental status/Cognition: Alert, oriented to self, place, month and year, good attention. Speech/language: Dysarthric speech, fluent, comprehension intact, object naming intact, repetition intact. Cranial nerves:   CN II Pupils equal and reactive to light, no VF deficits   CN III,IV,VI EOM intact, no gaze preference or deviation, no nystagmu   CN V normal sensation in V1, V2, and V3 segments bilaterally   CN VII Mild nasolabial fold flattening on the left.   CN VIII normal hearing to speech   CN IX & X normal palatal elevation, no uvular deviation   CN XI 5/5 head turn and 5/5 shoulder shrug bilaterally   CN XII midline tongue protrusion   Motor:  Muscle bulk: normal, tone increased diffusely with paratonia and impaired relaxation, pronator drift none Mvmt Root Nerve  Muscle Right Left Comments  SA C5/6 Ax Deltoid 5 5   EF C5/6 Mc Biceps 5 5   EE C6/7/8 Rad Triceps 5 5   WF C6/7 Med FCR 5 5   WE C7/8 PIN ECU 5 5   F Ab C8/T1 U ADM/FDI 5 5   HF L1/2/3 Fem Illopsoas 5 5   KE L2/3/4 Fem Quad 5 5   DF L4/5 D Peron Tib Ant 5 5   PF S1/2 Tibial Grc/Sol 5 5    Reflexes:  Right Left Comments  Pectoralis      Biceps (C5/6) 2 2   Brachioradialis (C5/6) 2 2    Triceps (C6/7) 2 2    Patellar (L3/4) 2 2    Achilles (S1) 1 1    Hoffman      Plantar down up   Jaw jerk    Sensation:  Light touch intact   Pin prick    Temperature    Vibration   Proprioception    Coordination/Complex Motor:  - Finger to Nose with some tremor but no ataxia no past pointing. - Heel to shin with tremor but no ataxia. - Rapid alternating movement normal -  Gait: deferred.  Labs   Lab Results  Component Value Date   HGBA1C 7.1 (H) 09/28/2019   Lab Results  Component Value Date   Donna  54 09/29/2019    Imaging and Diagnostic studies  No significant electrolyte abnormality except for potassium of 3.4  Impression   Kilo Eshelman is a 64 y.o. male with PMH significant for HTN, HLD, DM, morbid obesity, anxiety, BPH, prior stroke and TIA who presents via EMS as a Code Stroke for sudden onset of aphasia which was out of proportion to encephalopathy, slurring of speech and ?memory loss . His neurologic examination is notable for dysarthric speech, L facial droop, BL tremor and paratonia. Workup with MRI brain negative for a stroke. Routine EEG initially demonstrated suspcion for generalized slowing, maximal in L temporal which raised some suspicion for this episode being epileptogenic in nature. 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 He also had a prior stroke in the L frontoparietal region but that was subcortical and unlikely to cause this.  Recommendations  - continue cEEG for 24 hours, will take off later today. - MRI Brain with no acute stroke. - continue Aspirin 81mg  daily. - hold off on starting Antiepileptic at this time given negative cEEG - Follow up with Neurology outpatient in 2-3 months - No driving until cleared by Neurology outpatient and free of any episodes for 6 months. ______________________________________________________________________   Thank you for the opportunity to take part in the care of this patient. If you have any further questions, please contact the neurology consultation attending.  Signed,  [place] Pager Number [phone]

## 2019-10-01 NOTE — Progress Notes (Signed)
bp 189/101  MD notified. Pt verbalizes no complaints.

## 2019-10-01 NOTE — Progress Notes (Signed)
EEG maintenance complete. No skin breakdown at FP1 FP2 F7 F8

## 2019-10-01 NOTE — Progress Notes (Signed)
LTM discontinued; no skin breakdown was seen. 

## 2019-10-01 NOTE — Progress Notes (Signed)
PROGRESS NOTE    Keith Carter  SWF:[id number] DOB: 01-Oct-1955 DOA: 09/28/2019 PCP: [name], NP   Brief Narrative:  HPI per Dr. Karmen Bongo on 09/28/19 Keith Carter is a 64 y.o. male with medical history significant of CVA (04/2019); HTN; HLD; DM; morbid obesity; BPH; and anxiety presenting with aphasia.  He has had TIAs in the past.  He was admitted in March at [facility] for dysarthria/aphasia.  Since then, he has some memory difficulties and periodic expressive aphasia.  His back has been getting worse and so his mobility has not been as good.  His daughter had lunch with him yesterday, noticed left foot difficulty with gait but thought this was due to his back.  This AM, his wife called 14 - he wasn't able to remember his dog's name, address, birthday.  He was having trouble putting together sentences.  He feels fine otherwise.  Speech is the primary issue, also memory but not significantly worse since March.  He has fallen a couple of times recently at stores.  MRI from 04/04/19 confirms acute subcortical and deep white matter infarction in the left posterior frontal lobe.    ED Course:  H/o CVA in the spring, presented as Code stroke with aphasia, confusion.  Neurology recommends stroke work-up although possibly more encephalopathy.  **Interim History Stroke team neurology to evaluate further work-up revealed that he is likely had a seizure given his EEG.  Patient wanted to go home with home health so neurology started him on IV Keppra however he is going to stay at the urging of his family and likely go to SNF so we will order long-term EEG monitoring.  PT OT recommending SNF but patient wants to go home.  Further recommendations per neurology team.  Continues EEG was done and was negative for seizures so the neurologist has stopped his antiepileptics.  From a neurological perspective they have cleared the patient for discharge and recommending aspirin 81 mg p.o. daily.  They  recommended no driving per [facility].  Patient will need to go to a skilled nursing facility.  Patient blood pressure still remains a little bit elevated so we will add IV hydralazine and continue his home antihypertensive now.  Assessment & Plan:   Principal Problem:   Aphasia Active Problems:   Dysarthria   Type 2 diabetes mellitus with hyperlipidemia (HCC)   Essential hypertension   Obesity (BMI 30-39.9)   TIA (transient ischemic attack)  Aphasia/dysarthia, concerning for TIA/CVA and likely was a seizure -Concerning for TIA/CVA but further work-up by the neurologist revealed likely patient is a seizure -Placed in observation status for CVA/TIA evaluation but will change to Inpatient  -C/w Telemetry monitoring -MRI showed "No evidence of recent infarction, hemorrhage, or mass. Chronic microvascular ischemic changes, chronic small vessel infarcts, and chronic microhemorrhages as seen previously." -CTA head/neck showed "Negative for intracranial large vessel occlusion or flow limiting Stenosis  No significant carotid or vertebral artery stenosis in the neck. Minimal atherosclerotic disease." -Echo showed the left ventricular ejection fraction was 50 to 55% with no regional wall motion abnormalities and there was left mild ventricular hypertrophy.  Left ventricular diastolic parameters were indeterminate. -Risk stratification with FLP, A1c; will also check TSH and UDS -Lipid panel done and showed a total cholesterol/HDL ratio of 2.5, cholesterol level 122, HDL of 49, LDL of 54, triglycerides of 96, VLDL 19 -Continue ASA daily, but this appears to be ASA failure and so likely to need DAPT followed by transition to  Plavix but will defer to Neurology Stroke Team to initiate; stroke team evaluated and likely he did not have a TIA but likely was a seizure so neurology switch the patient back to aspirin monotherapy -Neurology consulted and Stroke team to still see the patient however need  to hospitalist now feels it is not stroke and or TIA and likely was a seizure as the left temporal slowing could potentially explain the event that was predominant aphasia.  Neuro hospitalist also feels that he does have lobar micrometers potentially concerning for underlying amyloid angiopathy as he also appears to have atrophy with involvement of both temporal lobes and suspect that this may be the nidus for the episodes -Patient received an EEG which showed "This study is suggestive of nonspecific cortical dysfunction in left temporal region as well as mild diffuse encephalopathy, nonspecific etiology.  No seizures or epileptiform discharges were seen throughout the recording." -Neurology discussed with the patient and because the patient wanted to go home patient was loaded with IV Keppra and started on maintenance Keppra at 500 twice daily.  He was advised not to drive however neurology was recalled given the family had a discussion with the patient he is going to stay and will discontinue his Keppra and do a LTM EEG monitoring -LTM EEG monitoring was negative for a seizure and was read as a study within normal limits.  There are no seizures or epileptiform discharges seen throughout the reading. -Appreciate neuro recommendations and they are recommending no more antiepileptics -PT/OT/ST/Nutrition Consults; Recommending SNF; CIR is being recommended by speech therapy -Currently getting IVF with NS at 50 mL/hr and will stop -We will continue aspirin at discharge will need to follow-up with neurology in 2 to 3 months -PT/OT recommending SNF and can discharge when bed is available as his family has convinced him to get rehab.   HTN -Allowed permissive HTN initially due to concern for TIA/CVA with the intention to reat BP only if >220/120, and then with goal of 15% reduction -Stop IVF now; -Resume Home Lisinopril -BP was elevateda t 189/101 -Will add IV Hydralazine 10 mg q6hprn for SBP >180 or  DBP>100  HLD -Lipid Panel as Above -Continue Lipitor 40 mg daily for now  DM Type 2 -Prior A1c showed reasonably good control; will recheck and was 7.1 -Hold home PO Glucophage -Will order moderate-scale SSI -CBG's ranging from 108-186  Obesity -Estimated body mass index is 37.87 kg/m as calculated from the following:   Height as of this encounter: 5\' 11"  (1.803 m).   Weight as of 04/19/19: 123.2 kg. -Weight loss and Dietary Counseling should be encouraged -Outpatient PCP/bariatric medicine/bariatric surgery f/u encouraged  Hypomagnesemia -Patient's magnesium level is 1.3 and improved to 1.8 now -Continue to monitor and replete as necessary -Repeat magnesium level in a.m.  Hypophosphatemia -Patient's phosphorus level was 2.3 and improved to 3.2 -Continue monitor and replete as necessary -Repeat phosphorus level in a.m.  Hypokalemia -Patient's potassium was 3.4 and is improved to 4.5 -Continue to monitor and replete as necessary -Repeat CMP in the a.m.  Hyperbilirubinemia -Mild with a T bili going from 0.9 -> 1.3 and today is 1.1 -Continue to monitor and trend and repeat CMP in a.m.  Thyroid Nodule  -TSH was 0.746 -CTA H/N Showed "2 cm nodule left lobe of thyroid. Thyroid ultrasound recommended." -Follow up in the outpatient setting for Thyroid U/S  DVT prophylaxis: Enoxaparin 40 mg sq q24h Code Status: DO NOT RESUSCITATE  Family Communication: No family present at  bedside discussed with the patient's daughter over the telephone this evening Disposition Plan: PT/OT recommending SNF but patient rather would go home with Home Health   Status is: Inpatient  Remains inpatient appropriate because:Ongoing diagnostic testing needed not appropriate for outpatient work up, Unsafe d/c plan and Inpatient level of care appropriate due to severity of illness   Dispo: The patient is from: Home              Anticipated d/c is to: SNF               Anticipated d/c date is: 1  day              Patient currently is not medically stable to d/c.  Consultants:   Neurology   Procedures:  ECHOCARDIOGRAM IMPRESSIONS    1. Left ventricular ejection fraction, by estimation, is 50 to 55%. The  left ventricle has low normal function. The left ventricle has no regional  wall motion abnormalities. There is mild left ventricular hypertrophy.  Left ventricular diastolic  parameters are indeterminate.  2. Right ventricular systolic function is normal. The right ventricular  size is normal. Tricuspid regurgitation signal is inadequate for assessing  PA pressure.  3. The mitral valve is grossly normal. Trivial mitral valve  regurgitation.  4. The aortic valve is tricuspid. Aortic valve regurgitation is not  visualized. Mild aortic valve sclerosis is present, with no evidence of  aortic valve stenosis.  5. The inferior vena cava is normal in size with greater than 50%  respiratory variability, suggesting right atrial pressure of 3 mmHg.   FINDINGS  Left Ventricle: Left ventricular ejection fraction, by estimation, is 50  to 55%. The left ventricle has low normal function. The left ventricle has  no regional wall motion abnormalities. Definity contrast agent was given  IV to delineate the left  ventricular endocardial borders. The left ventricular internal cavity size  was normal in size. There is mild left ventricular hypertrophy. Left  ventricular diastolic parameters are indeterminate.   Right Ventricle: The right ventricular size is normal. No increase in  right ventricular wall thickness. Right ventricular systolic function is  normal. Tricuspid regurgitation signal is inadequate for assessing PA  pressure.   Left Atrium: Left atrial size was normal in size.   Right Atrium: Right atrial size was normal in size.   Pericardium: There is no evidence of pericardial effusion.   Mitral Valve: The mitral valve is grossly normal. Mild mitral annular    calcification. Trivial mitral valve regurgitation.   Tricuspid Valve: The tricuspid valve is grossly normal. Tricuspid valve  regurgitation is trivial.   Aortic Valve: The aortic valve is tricuspid. Aortic valve regurgitation is  not visualized. Mild aortic valve sclerosis is present, with no evidence  of aortic valve stenosis.   Pulmonic Valve: The pulmonic valve was grossly normal. Pulmonic valve  regurgitation is not visualized.   Aorta: The aortic root is normal in size and structure.   Venous: The inferior vena cava is normal in size with greater than 50%  respiratory variability, suggesting right atrial pressure of 3 mmHg.   IAS/Shunts: No atrial level shunt detected by color flow Doppler.     LEFT VENTRICLE  PLAX 2D  LVIDd:     4.70 cm   Diastology  LVIDs:     3.60 cm   LV e' lateral:  8.22 cm/s  LV PW:     1.00 cm   LV E/e' lateral: 5.3  LV IVS:  1.20 cm   LV e' medial:  5.64 cm/s  LVOT diam:   2.10 cm   LV E/e' medial: 7.8  LV SV:     70  LV SV Index:  29  LVOT Area:   3.46 cm    LV Volumes (MOD)  LV vol d, MOD A2C: 117.0 ml  LV vol d, MOD A4C: 104.0 ml  LV vol s, MOD A2C: 57.9 ml  LV vol s, MOD A4C: 55.2 ml  LV SV MOD A2C:   59.1 ml  LV SV MOD A4C:   104.0 ml  LV SV MOD BP:   55.4 ml   RIGHT VENTRICLE       IVC  RV S prime:   15.70 cm/s IVC diam: 1.80 cm  TAPSE (M-mode): 2.3 cm   LEFT ATRIUM       Index    RIGHT ATRIUM      Index  LA diam:    3.60 cm 1.50 cm/m RA Area:   18.20 cm  LA Vol (A2C):  35.6 ml 14.83 ml/m RA Volume:  55.00 ml 22.91 ml/m  LA Vol (A4C):  71.2 ml 29.65 ml/m  LA Biplane Vol: 54.4 ml 22.66 ml/m  AORTIC VALVE  LVOT Vmax:  107.00 cm/s  LVOT Vmean: 63.400 cm/s  LVOT VTI:  0.201 m    AORTA  Ao Root diam: 3.80 cm  Ao Asc diam: 3.50 cm   MITRAL VALVE  MV Area (PHT): 4.68 cm  SHUNTS  MV Decel Time: 162 msec  Systemic VTI:  0.20 m  MV E velocity: 43.75 cm/s Systemic Diam: 2.10 cm  MV A velocity: 64.70 cm/s  MV E/A ratio: 0.68   EEG This study is suggestive of nonspecific cortical dysfunction in left temporal region as well as mild diffuse encephalopathy, nonspecific etiology.  No seizures or epileptiform discharges were seen throughout the recording.  LTM  Antimicrobials:  Anti-infectives (From admission, onward)   None     Subjective: Seen and examined at bedside some dysarthria.  Was on long-term EEG monitoring and this was negative.  He had no complaints or concerns at this time.  No chest pain or shortness of breath.  Objective: Vitals:   10/01/19 0756 10/01/19 1147 10/01/19 1546 10/01/19 1858  BP: (!) 169/104 (!) 184/107 (!) 189/110 (!) 189/101  Pulse: 74 77 74 76  Resp: 20 20 16    Temp: 97.6 F (36.4 C) 97.6 F (36.4 C) 97.9 F (36.6 C)   TempSrc: Oral Oral Oral   SpO2: 97% 97% 98%   Height:        Intake/Output Summary (Last 24 hours) at 10/01/2019 1902 Last data filed at 10/01/2019 1842 Gross per 24 hour  Intake 890 ml  Output 1085 ml  Net -195 ml   There were no vitals filed for this visit.  Examination: Physical Exam:  Constitutional: WN/WD obese Caucasian male in NAD and appears calm and comfortable Eyes: Lids and conjunctivae normal, sclerae anicteric  ENMT: External Ears, Nose appear normal. Grossly normal hearing.  Neck: Appears normal, supple, no cervical masses, normal ROM, no appreciable thyromegaly; no JVD Respiratory: Diminished to auscultation bilaterally, no wheezing, rales, rhonchi or crackles. Normal respiratory effort and patient is not tachypenic. No accessory muscle use. Unlabored breathing Cardiovascular: RRR, no murmurs / rubs / gallops. S1 and S2 auscultated. Has some mild lower extremity edema Abdomen: Soft, non-tender, distended 2/2 body habitus. Bowel sounds positive x4.  GU: Deferred. Musculoskeletal: No clubbing / cyanosis of digits/nails. No  joint  deformity upper and lower extremities.  Skin: No rashes, lesions, ulcers on a limited skin evaluation. No induration; Warm and dry.  Neurologic: CN 2-12 grossly intact with no focal deficits and continues to have some dysarthria. Romberg sign cerebellar reflexes not assessed.  Psychiatric: Normal judgment and insight. Alert and oriented x 3. Normal mood and appropriate affect.   Data Reviewed: I have personally reviewed following labs and imaging studies  CBC: Recent Labs  Lab 09/28/19 1017 09/28/19 1025 09/29/19 0917 09/30/19 0126 10/01/19 0552  WBC 7.4  --  8.4 8.9 8.2  NEUTROABS 4.9  --  6.6 5.8 5.2  HGB 14.6 15.0 14.8 14.1 13.8  HCT 46.0 44.0 45.3 43.6 43.0  MCV 92.9  --  90.2 89.0 90.0  PLT 235  --  256 252 024   Basic Metabolic Panel: Recent Labs  Lab 09/28/19 1017 09/28/19 1025 09/29/19 0917 09/30/19 0126 10/01/19 0552  NA 141 142 137 139 139  K 4.1 4.1 3.9 3.4* 4.5  CL 109 108 103 103 108  CO2 23  --  22 26 22   GLUCOSE 165* 158* 161* 138* 142*  BUN 15 18 11 13 17   CREATININE 1.18 1.00 0.90 0.99 0.99  CALCIUM 9.1  --  9.0 9.0 8.8*  MG  --   --  1.3* 2.0 1.8  PHOS  --   --  2.3* 3.2 3.2   GFR: CrCl cannot be calculated (Unknown ideal weight.). Liver Function Tests: Recent Labs  Lab 09/28/19 1017 09/29/19 0917 09/30/19 0126 10/01/19 0552  AST 17 17 16 17   ALT 15 17 15 15   ALKPHOS 73 74 79 77  BILITOT 0.9 1.3* 1.2 1.1  PROT 6.4* 6.4* 6.2* 6.2*  ALBUMIN 3.4* 3.4* 3.2* 3.1*   No results for input(s): LIPASE, AMYLASE in the last 168 hours. No results for input(s): AMMONIA in the last 168 hours. Coagulation Profile: Recent Labs  Lab 09/28/19 1017  INR 1.0   Cardiac Enzymes: No results for input(s): CKTOTAL, CKMB, CKMBINDEX, TROPONINI in the last 168 hours. BNP (last 3 results) No results for input(s): PROBNP in the last 8760 hours. HbA1C: No results for input(s): HGBA1C in the last 72 hours. CBG: Recent Labs  Lab 09/30/19 1530 09/30/19 2105  10/01/19 0558 10/01/19 1144 10/01/19 1558  GLUCAP 163* 100* 145* 186* 108*   Lipid Profile: Recent Labs    09/29/19 0123  CHOL 122  HDL 49  LDLCALC 54  TRIG 96  CHOLHDL 2.5   Thyroid Function Tests: No results for input(s): TSH, T4TOTAL, FREET4, T3FREE, THYROIDAB in the last 72 hours. Anemia Panel: No results for input(s): VITAMINB12, FOLATE, FERRITIN, TIBC, IRON, RETICCTPCT in the last 72 hours. Sepsis Labs: No results for input(s): PROCALCITON, LATICACIDVEN in the last 168 hours.  Recent Results (from the past 240 hour(s))  SARS Coronavirus 2 by RT PCR (hospital order, performed in [facility] hospital lab) Nasopharyngeal Nasopharyngeal Swab     Status: None   Collection Time: 09/28/19 11:33 AM   Specimen: Nasopharyngeal Swab  Result Value Ref Range Status   SARS Coronavirus 2 NEGATIVE NEGATIVE Final    Comment: (NOTE) SARS-CoV-2 target nucleic acids are NOT DETECTED.  The SARS-CoV-2 RNA is generally detectable in upper and lower respiratory specimens during the acute phase of infection. The lowest concentration of SARS-CoV-2 viral copies this assay can detect is 250 copies / mL. A negative result does not preclude SARS-CoV-2 infection and should not be used as the sole basis for treatment  or other patient management decisions.  A negative result may occur with improper specimen collection / handling, submission of specimen other than nasopharyngeal swab, presence of viral mutation(s) within the areas targeted by this assay, and inadequate number of viral copies (<250 copies / mL). A negative result must be combined with clinical observations, patient history, and epidemiological information.  Fact Sheet for Patients:   StrictlyIdeas.no  Fact Sheet for Healthcare Providers: BankingDealers.co.za  This test is not yet approved or  cleared by the [place] FDA and has been authorized for detection and/or diagnosis of  SARS-CoV-2 by FDA under an Emergency Use Authorization (EUA).  This EUA will remain in effect (meaning this test can be used) for the duration of the COVID-19 declaration under Section 564(b)(1) of the Act, 21 U.S.C. section 360bbb-3(b)(1), unless the authorization is terminated or revoked sooner.  Performed at [facility], [place] [address] [place], [place] [address]     RN Pressure Injury Documentation:     Estimated body mass index is 37.87 kg/m as calculated from the following:   Height as of this encounter: 5\' 11"  (1.803 m).   Weight as of 04/19/19: 123.2 kg.  Malnutrition Type:      Malnutrition Characteristics:      Nutrition Interventions:    Radiology Studies: EEG adult  Result Date: 10-24-19 Lora Havens, MD     Oct 24, 2019 12:18 PM Patient Name: Keith Carter MRN: [id number] Epilepsy Attending: Lora Havens Referring Physician/Provider: Dr Donnetta Simpers Date: 10/24/2019 Duration: 24.51 mins Patient history: 64 y.o. male with a PMHx of HTN, HLD, DM, morbid obesity, anxiety, BPH, prior stroke and TIA who presents via EMS as a Code Stroke for sudden onset of confusion and memory loss. MR Brain didn't show any stroke. EEG to evaluate for seizure. Level of alertness: Awake AEDs during EEG study: None Technical aspects: This EEG study was done with scalp electrodes positioned according to the 10-20 International system of electrode placement. Electrical activity was acquired at a sampling rate of 500Hz  and reviewed with a high frequency filter of 70Hz  and a low frequency filter of 1Hz . EEG data were recorded continuously and digitally stored. Description: The posterior dominant rhythm consists of 8-9 Hz activity of moderate voltage (25-35 uV) seen predominantly in posterior head regions, symmetric and reactive to eye opening and eye closing. EEG showed intermittent generalized and maximal left temporal region 3 to 6 Hz theta-delta slowing. Hyperventilation and  photic stimulation were not performed.   ABNORMALITY -Intermittent slow, generalized and maximal left temporal region IMPRESSION: This study is suggestive of nonspecific cortical dysfunction in left temporal region as well as mild diffuse encephalopathy, nonspecific etiology.  No seizures or epileptiform discharges were seen throughout the recording. Priyanka [name]   Overnight EEG with video  Result Date: 10/01/2019 Lora Havens, MD     10/01/2019  9:31 AM Patient Name: Keith Carter MRN: [id number] Epilepsy Attending: Lora Havens Referring Physician/Provider: Dr Donnetta Simpers Duration: 10/24/19 1605 to 10/01/2019 0930  Patient history: 64 y.o.malewith a PMHx of HTN, HLD, DM, morbid obesity, anxiety, BPH, prior stroke and TIA who presents via EMS as a Code Stroke for sudden onset of confusion and memory loss. MR Brain didn't show any stroke. EEG to evaluate for seizure.  Level of alertness: Awake, asleep  AEDs during EEG study: None  Technical aspects: This EEG study was done with scalp electrodes positioned according to the 10-20 International system of electrode placement. Electrical activity was acquired at  a sampling rate of 500Hz  and reviewed with a high frequency filter of 70Hz  and a low frequency filter of 1Hz . EEG data were recorded continuously and digitally stored.  Description: The posterior dominant rhythm consists of 8-9 Hz activity of moderate voltage (25-35 uV) seen predominantly in posterior head regions, symmetric and reactive to eye opening and eye closing. Hyperventilation and photic stimulation were not performed.    IMPRESSION: This study is within normal limits. No seizures or epileptiform discharges were seen throughout the recording.  Priyanka [name]   Scheduled Meds: . aspirin  81 mg Oral Daily  . atorvastatin  40 mg Oral q1800  . enoxaparin (LOVENOX) injection  40 mg Subcutaneous Q24H  . insulin aspart  0-15 Units Subcutaneous TID WC  . insulin aspart  0-5  Units Subcutaneous QHS  . lidocaine  1-3 patch Transdermal Q24H  . lisinopril  40 mg Oral QHS  . PARoxetine  40 mg Oral QHS  . tamsulosin  0.8 mg Oral QHS   Continuous Infusions: . sodium chloride 50 mL/hr at 09/29/19 2348     LOS: 2 days    Kerney Elbe, DO [facility] PAGER is on AMION  If 7PM-7AM, please contact night-coverage [email or web address]

## 2019-10-02 LAB — CBC WITH DIFFERENTIAL/PLATELET
Abs Immature Granulocytes: 0.04 10*3/uL (ref 0.00–0.07)
Basophils Absolute: 0.1 10*3/uL (ref 0.0–0.1)
Basophils Relative: 1 %
Eosinophils Absolute: 0.3 10*3/uL (ref 0.0–0.5)
Eosinophils Relative: 4 %
HCT: 44.6 % (ref 39.0–52.0)
Hemoglobin: 14.3 g/dL (ref 13.0–17.0)
Immature Granulocytes: 1 %
Lymphocytes Relative: 21 %
Lymphs Abs: 1.6 10*3/uL (ref 0.7–4.0)
MCH: 28.8 pg (ref 26.0–34.0)
MCHC: 32.1 g/dL (ref 30.0–36.0)
MCV: 89.9 fL (ref 80.0–100.0)
Monocytes Absolute: 0.6 10*3/uL (ref 0.1–1.0)
Monocytes Relative: 8 %
Neutro Abs: 4.8 10*3/uL (ref 1.7–7.7)
Neutrophils Relative %: 65 %
Platelets: 247 10*3/uL (ref 150–400)
RBC: 4.96 MIL/uL (ref 4.22–5.81)
RDW: 14 % (ref 11.5–15.5)
WBC: 7.4 10*3/uL (ref 4.0–10.5)
nRBC: 0 % (ref 0.0–0.2)

## 2019-10-02 LAB — COMPREHENSIVE METABOLIC PANEL
ALT: 26 U/L (ref 0–44)
AST: 24 U/L (ref 15–41)
Albumin: 3.2 g/dL — ABNORMAL LOW (ref 3.5–5.0)
Alkaline Phosphatase: 84 U/L (ref 38–126)
Anion gap: 9 (ref 5–15)
BUN: 17 mg/dL (ref 8–23)
CO2: 24 mmol/L (ref 22–32)
Calcium: 8.9 mg/dL (ref 8.9–10.3)
Chloride: 105 mmol/L (ref 98–111)
Creatinine, Ser: 1.07 mg/dL (ref 0.61–1.24)
GFR calc Af Amer: >60 mL/min (ref >60)
GFR calc non Af Amer: >60 mL/min (ref >60)
Glucose, Bld: 139 mg/dL — ABNORMAL HIGH (ref 70–99)
Potassium: 4.1 mmol/L (ref 3.5–5.1)
Sodium: 138 mmol/L (ref 135–145)
Total Bilirubin: 1.3 mg/dL — ABNORMAL HIGH (ref 0.3–1.2)
Total Protein: 6.3 g/dL — ABNORMAL LOW (ref 6.5–8.1)

## 2019-10-02 LAB — MAGNESIUM: Magnesium: 1.9 mg/dL (ref 1.7–2.4)

## 2019-10-02 LAB — SARS CORONAVIRUS 2 (TAT 6-24 HRS): SARS Coronavirus 2: NEGATIVE

## 2019-10-02 LAB — GLUCOSE, CAPILLARY
Glucose-Capillary: 137 mg/dL — ABNORMAL HIGH (ref 70–99)
Glucose-Capillary: 228 mg/dL — ABNORMAL HIGH (ref 70–99)
Glucose-Capillary: 166 mg/dL — ABNORMAL HIGH (ref 70–99)
Glucose-Capillary: 145 mg/dL — ABNORMAL HIGH (ref 70–99)

## 2019-10-02 LAB — PHOSPHORUS: Phosphorus: 3.4 mg/dL (ref 2.5–4.6)

## 2019-10-02 MED ORDER — AMLODIPINE BESYLATE 5 MG PO TABS
5.0000 mg | ORAL_TABLET | Freq: Every day | ORAL | Status: DC
  Administered 2019-10-02 – 2019-10-03 (×2): 5 mg via ORAL
  Filled 2019-10-02 (×2): qty 1

## 2019-10-02 MED ORDER — AMLODIPINE BESYLATE 5 MG PO TABS
5.0000 mg | ORAL_TABLET | Freq: Every day | ORAL | 0 refills | Status: DC
Start: 2019-10-02 — End: 2022-03-22

## 2019-10-02 NOTE — Progress Notes (Signed)
Physical Therapy Treatment Patient Details Name: Keith Carter MRN: [id number] DOB: 12/28/1955 Today's Date: 10/02/2019    History of Present Illness Keith Carter is a 64 y.o. male with medical history significant of CVA (04/2019); HTN; HLD; DM; morbid obesity; BPH; and anxiety presenting with aphasia.  He has had TIAs in the past.  He was admitted in March at [facility] for dysarthria/aphasia.  Since then, he has some memory difficulties and periodic expressive aphasia.  His back has been getting worse and so his mobility has not been as good. Neurology consult "TIA vs recrudescence of prior stroke like symptoms... vs seizure." EEG showed slowing left temporal lobe; 24 hour EEG negative for seizure    PT Comments    Patient upset that he is not going home. Once calmer, he could verbalize that he had 2 recent falls PTA (both in business establishments). He currently could not begin to be safe to walk into a business and yet he insists he is walking as well as he did before his falls. He agrees he can benefit from more therapy and "just want to get on with it. I need to get out of here." Required min assist for balance and to maneuver RW into narrow bathroom and to untangle RW from bed sheet as he walked past end of bed.     Follow Up Recommendations  SNF (If pt refuses SNF - then would rec [facility])     Equipment Recommendations  None recommended by PT    Recommendations for Other Services       Precautions / Restrictions Precautions Precautions: Fall Precaution Comments: Pt reports he has had 2 recent falls (at [place] and then a store) Restrictions Weight Bearing Restrictions: No    Mobility  Bed Mobility Overal bed mobility: Needs Assistance Bed Mobility: Rolling;Sidelying to Sit Rolling: Modified independent (Device/Increase time) (with rail) Sidelying to sit: Min assist;HOB elevated (to left; +rail)       General bed mobility comments: exit bed to opposite side than  previously  Transfers Overall transfer level: Needs assistance Equipment used: Rolling walker (2 wheeled) Transfers: Sit to/from Stand Sit to Stand: Min guard;Min assist         General transfer comment: slow processing with incr time; assist from bed at normal height; guarding from standard toilet with grab bar  Ambulation/Gait Ambulation/Gait assistance: Min assist Gait Distance (Feet): 12 Feet (25) Assistive device: Rolling walker (2 wheeled) Gait Pattern/deviations: Trunk flexed;Decreased stride length;Shuffle Gait velocity: decr   General Gait Details: pt assisted to the bathroom per his request; despite cues to call for assist prior to standing, he was observed attempting to stand on his own!; required assist to understand how to turn and step sideways with RW into narrow bathroom   Stairs             Wheelchair Mobility    Modified Rankin (Stroke Patients Only)       Balance Overall balance assessment: Needs assistance;History of Falls Sitting-balance support: No upper extremity supported;Feet supported Sitting balance-Leahy Scale: Fair (>fair NT)     Standing balance support: Bilateral upper extremity supported Standing balance-Leahy Scale: Poor Standing balance comment: at sink to wash hands he leans on forearms; can stand for up to 15 sec statically without holding onto RW with close-guarding                            Cognition Arousal/Alertness: Awake/alert Behavior During Therapy:  (frustrated/upset; wants  to go home) Overall Cognitive Status: No family/caregiver present to determine baseline cognitive functioning Area of Impairment: Attention;Memory;Following commands;Safety/judgement;Awareness;Problem solving                 Orientation Level:  (NT-pt agitated) Current Attention Level: Selective Memory: Decreased recall of precautions Following Commands: Follows one step commands with increased time Safety/Judgement:  Decreased awareness of safety;Decreased awareness of deficits Awareness: Emergent Problem Solving: Slow processing;Requires verbal cues        Exercises      General Comments General comments (skin integrity, edema, etc.): Agrees he is not walking as well as he did before falls, yet wants to go home for 1-2 days and then go to rehab       Pertinent Vitals/Pain Pain Assessment: No/denies pain    Home Living                      Prior Function            PT Goals (current goals can now be found in the care plan section) Acute Rehab PT Goals Patient Stated Goal: to go home Time For Goal Achievement: 10/11/19 Potential to Achieve Goals: Good Progress towards PT goals: Progressing toward goals    Frequency    Min 3X/week      PT Plan Current plan remains appropriate    Co-evaluation              AM-PAC PT "6 Clicks" Mobility   Outcome Measure  Help needed turning from your back to your side while in a flat bed without using bedrails?: A Little Help needed moving from lying on your back to sitting on the side of a flat bed without using bedrails?: A Little Help needed moving to and from a bed to a chair (including a wheelchair)?: A Little Help needed standing up from a chair using your arms (e.g., wheelchair or bedside chair)?: A Little Help needed to walk in hospital room?: A Little Help needed climbing 3-5 steps with a railing? : A Lot 6 Click Score: 17    End of Session Equipment Utilized During Treatment: Gait belt Activity Tolerance: Patient tolerated treatment well Patient left: with call bell/phone within reach;in chair;with chair alarm set Nurse Communication: Mobility status PT Visit Diagnosis: Unsteadiness on feet (R26.81);Repeated falls (R29.6);Difficulty in walking, not elsewhere classified (R26.2);Muscle weakness (generalized) (M62.81)     Time: 1121-1201 PT Time Calculation (min) (ACUTE ONLY): 40 min  Charges:  $Gait Training:  23-37 mins                      Arby Barrette, PT Pager [phone]    Rexanne Mano 10/02/2019, 12:42 PM

## 2019-10-02 NOTE — Progress Notes (Signed)
Brief Neuro Update and Signoff note:  Patient is awaiting transfer to a SNF. cEEG was taken off with no epileptogenic abnormality. Neurology inpatient team will signoff. Please see my last note from 10/01/19 for complete recommendations.

## 2019-10-02 NOTE — TOC Progression Note (Signed)
Transition of Care [facility]) - Progression Note    Patient Details  Name: Keith Carter MRN: [id number] Date of Birth: 04/28/1955  Transition of Care [facility] CM/SW [place], [place] Phone Number: 10/02/2019, 4:23 PM  Clinical Narrative:   CSW attempted to reach daughter to discuss bed offers, but daughter was at work. CSW texted to ask about choices, family still needs to research options available.     Expected Discharge Plan: Skilled Nursing Facility Barriers to Discharge: Ship broker, Continued Medical Work up  Expected Discharge Plan and Services Expected Discharge Plan: [place] Choice: [place] arrangements for the past 2 months: Single Family Home Expected Discharge Date: 10/02/19                                     Social Determinants of Health (SDOH) Interventions    Readmission Risk Interventions No flowsheet data found.

## 2019-10-02 NOTE — Discharge Summary (Addendum)
Physician Discharge Summary  Keith Carter QIH:[id number] DOB: 1955/12/17 DOA: 09/28/2019  PCP: [name], NP  Admit date: 09/28/2019 Discharge date: 10/03/2019  Admitted From: Home Disposition: SNF  Recommendations for Outpatient Follow-up:  1. Follow up with PCP in 1-2 weeks 2. Please obtain BMP/CBC in one week your next doctors visit.  3. Aspirin 81 mg daily, follow-up outpatient neurology in 2-3 months 4. Advised to not drive for at least 6 months according to [facility] regulations.  Will need to be cleared by neurology prior. 5. Thyroid ultrasound outpatient with PCP   Discharge Condition: Stable CODE STATUS:  DNR Diet recommendation: Heart Healthy   Brief/Interim Summary: 64 y.o.malewith medical history significant ofCVA (04/2019); HTN; HLD; DM; morbid obesity; BPH; and anxiety presenting with aphasia.  Work-up in the hospital was mostly unremarkable.  EEG was also negative.  Neurology recommended daily aspirin, avoid driving for at least next 6 months and follow-up with neurology in 3 months.  Norvasc 5 mg daily was added.  PT recommended SNF therefore arrangements made.  Called his daughter but no answer therefore left a voicemail  Aphasia/dysarthia, concerning for TIA/CVA and likely was a seizure, improving Work-up in the hospital was unremarkable for any acute CVA.  There was high suspicion for TIA therefore started on daily aspirin by neurology.  There is also concerns of seizure therefore EEG was performed which was negative. Advised to not drive for at least 6 months and follow-up outpatient neurology in 3 months. LDL 54, echocardiogram EF 50-55%  HTN -Resume home regimen  HLD -Resume home Lipitor  DM Type 2 -Resume home medications  Obesity -Estimated body mass index is 37.87 kg/m as calculated from the following:   Height as of this encounter: 5\' 11"  (1.803 m).   Weight as of 04/19/19: 123.2 kg. -Weight loss and Dietary Counseling should be  encouraged -Outpatient PCP/bariatric medicine/bariatric surgery f/u encouraged   Thyroid Nodule  -TSH was 0.746 -CTA H/N Showed "2 cm nodule left lobe of thyroid. Thyroid ultrasound recommended." -Follow up in the outpatient setting for Thyroid U/S   Body mass index is 37.87 kg/m.         Discharge Diagnoses:  Principal Problem:   Aphasia Active Problems:   Dysarthria   Type 2 diabetes mellitus with hyperlipidemia (HCC)   Essential hypertension   Obesity (BMI 30-39.9)   TIA (transient ischemic attack)      Consultations:  Neurology  Subjective: Feels okay no complaints.  Has very mild dysarthria but states it has improved and approaching his baseline  Discharge Exam: Vitals:   10/02/19 2349 10/03/19 0358  BP: (!) 137/92 119/71  Pulse: 91 76  Resp: 19 19  Temp: 98.4 F (36.9 C) (!) 97.4 F (36.3 C)  SpO2: 92% 95%   Vitals:   10/02/19 1610 10/02/19 2033 10/02/19 2349 10/03/19 0358  BP: 111/68 (!) 147/96 (!) 137/92 119/71  Pulse: 78 72 91 76  Resp: 19 18 19 19   Temp: 98 F (36.7 C) 97.6 F (36.4 C) 98.4 F (36.9 C) (!) 97.4 F (36.3 C)  TempSrc: Oral Oral Oral Oral  SpO2: 96% 98% 92% 95%  Height:        General: Pt is alert, awake, not in acute distress Cardiovascular: RRR, S1/S2 +, no rubs, no gallops Respiratory: CTA bilaterally, no wheezing, no rhonchi Abdominal: Soft, NT, ND, bowel sounds + Extremities: no edema, no cyanosis  Discharge Instructions  Discharge Instructions    Discharge patient   Complete by: As  directed    Discharge disposition: 03-Skilled Nursing Facility   Discharge patient date: 10/02/2019     Allergies as of 10/03/2019   No Known Allergies     Medication List    STOP taking these medications   clopidogrel 75 MG tablet Commonly known as: PLAVIX     TAKE these medications   acetaminophen 500 MG tablet Commonly known as: TYLENOL Take 500 mg by mouth every 6 (six) hours as needed.   amLODipine 5 MG  tablet Commonly known as: NORVASC Take 1 tablet (5 mg total) by mouth daily.   aspirin 81 MG EC tablet Take 81 mg by mouth daily.   atorvastatin 40 MG tablet Commonly known as: LIPITOR Take 1 tablet (40 mg total) by mouth daily at 6 PM.   celecoxib 200 MG capsule Commonly known as: CELEBREX Take 200 mg by mouth daily.   Cholecalciferol 25 MCG (1000 UT) tablet Take 5,000 Units by mouth daily.   lisinopril 40 MG tablet Commonly known as: ZESTRIL Take 40 mg by mouth at bedtime.   metFORMIN 500 MG 24 hr tablet Commonly known as: GLUCOPHAGE-XR Take 500 mg by mouth 2 (two) times daily.   PARoxetine 40 MG tablet Commonly known as: PAXIL Take 40 mg by mouth at bedtime.   Potassium 99 MG Tabs Take by mouth daily.   sildenafil 20 MG tablet Commonly known as: REVATIO Take 20 mg by mouth daily as needed.   tamsulosin 0.4 MG Caps capsule Commonly known as: FLOMAX Take 0.8 mg by mouth at bedtime.       Follow-up Information    Gauger, Victoriano Lain, NP. Schedule an appointment as soon as possible for a visit in 1 week(s).   Specialty: Internal Medicine Contact information: [address] [place] [address] [phone]        Garvin Fila, MD. Schedule an appointment as soon as possible for a visit in 3 month(s).   Specialties: Neurology, Radiology Contact information: [address] [place] [address] [phone]              No Known Allergies  You were cared for by a hospitalist during your hospital stay. If you have any questions about your discharge medications or the care you received while you were in the hospital after you are discharged, you can call the unit and asked to speak with the hospitalist on call if the hospitalist that took care of you is not available. Once you are discharged, your primary care physician will handle any further medical issues. Please note that no refills for any discharge medications will be authorized  once you are discharged, as it is imperative that you return to your primary care physician (or establish a relationship with a primary care physician if you do not have one) for your aftercare needs so that they can reassess your need for medications and monitor your lab values.   Procedures/Studies: CT ANGIO HEAD W OR WO CONTRAST  Addendum Date: 09/28/2019   ADDENDUM REPORT: 09/28/2019 21:08 ADDENDUM: 2 cm nodule left lobe of thyroid. Thyroid ultrasound recommended. (Ref: J Am Coll Radiol. 2015 Feb;12(2): 143-50). Electronically Signed   By: Franchot Gallo M.D.   On: 09/28/2019 21:08   Result Date: 09/28/2019 CLINICAL DATA:  TIA.  Aphasia. EXAM: CT ANGIOGRAPHY HEAD AND NECK TECHNIQUE: Multidetector CT imaging of the head and neck was performed using the standard protocol during bolus administration of intravenous contrast. Multiplanar CT image reconstructions and MIPs were obtained  to evaluate the vascular anatomy. Carotid stenosis measurements (when applicable) are obtained utilizing NASCET criteria, using the distal internal carotid diameter as the denominator. CONTRAST:  50mL OMNIPAQUE IOHEXOL 350 MG/ML SOLN COMPARISON:  MRI head and CT head 09/28/2019 FINDINGS: CTA NECK FINDINGS Aortic arch: Standard branching. Imaged portion shows no evidence of aneurysm or dissection. No significant stenosis of the major arch vessel origins. Right carotid system: Right carotid widely patent without stenosis. Negative for atherosclerotic disease or dissection Left carotid system: Mild atherosclerotic disease left carotid bifurcation without stenosis. Negative for dissection. Vertebral arteries: Both vertebral arteries are widely patent to the basilar without significant stenosis. Skeleton: Cervical degenerative changes specially in the facet joints bilaterally. No acute skeletal abnormality. Other neck: 2 cm nodule left lobe of the thyroid inferiorly. No adenopathy in the neck. Upper chest: Visualized lung apices  clear bilaterally. Superior mediastinum negative. Review of the MIP images confirms the above findings CTA HEAD FINDINGS Anterior circulation: Minimal atherosclerotic calcification in the cavernous carotid bilaterally without stenosis. Anterior and middle cerebral arteries patent bilaterally without stenosis or large vessel occlusion. Posterior circulation: Both vertebral arteries patent to the basilar. Mild atherosclerotic calcification distal right vertebral artery without significant stenosis. Right PICA patent. Left PICA not visualized. Basilar widely patent. AICA, superior cerebellar, and posterior cerebral arteries patent bilaterally without occlusion or significant stenosis. Venous sinuses: Normal venous enhancement Anatomic variants: None Review of the MIP images confirms the above findings IMPRESSION: 1. Negative for intracranial large vessel occlusion or flow limiting stenosis 2. No significant carotid or vertebral artery stenosis in the neck. Minimal atherosclerotic disease. Electronically Signed: By: Franchot Gallo M.D. On: 09/28/2019 16:51   CT ANGIO NECK W OR WO CONTRAST  Addendum Date: 09/28/2019   ADDENDUM REPORT: 09/28/2019 21:08 ADDENDUM: 2 cm nodule left lobe of thyroid. Thyroid ultrasound recommended. (Ref: J Am Coll Radiol. 2015 Feb;12(2): 143-50). Electronically Signed   By: Franchot Gallo M.D.   On: 09/28/2019 21:08   Result Date: 09/28/2019 CLINICAL DATA:  TIA.  Aphasia. EXAM: CT ANGIOGRAPHY HEAD AND NECK TECHNIQUE: Multidetector CT imaging of the head and neck was performed using the standard protocol during bolus administration of intravenous contrast. Multiplanar CT image reconstructions and MIPs were obtained to evaluate the vascular anatomy. Carotid stenosis measurements (when applicable) are obtained utilizing NASCET criteria, using the distal internal carotid diameter as the denominator. CONTRAST:  45mL OMNIPAQUE IOHEXOL 350 MG/ML SOLN COMPARISON:  MRI head and CT head 09/28/2019  FINDINGS: CTA NECK FINDINGS Aortic arch: Standard branching. Imaged portion shows no evidence of aneurysm or dissection. No significant stenosis of the major arch vessel origins. Right carotid system: Right carotid widely patent without stenosis. Negative for atherosclerotic disease or dissection Left carotid system: Mild atherosclerotic disease left carotid bifurcation without stenosis. Negative for dissection. Vertebral arteries: Both vertebral arteries are widely patent to the basilar without significant stenosis. Skeleton: Cervical degenerative changes specially in the facet joints bilaterally. No acute skeletal abnormality. Other neck: 2 cm nodule left lobe of the thyroid inferiorly. No adenopathy in the neck. Upper chest: Visualized lung apices clear bilaterally. Superior mediastinum negative. Review of the MIP images confirms the above findings CTA HEAD FINDINGS Anterior circulation: Minimal atherosclerotic calcification in the cavernous carotid bilaterally without stenosis. Anterior and middle cerebral arteries patent bilaterally without stenosis or large vessel occlusion. Posterior circulation: Both vertebral arteries patent to the basilar. Mild atherosclerotic calcification distal right vertebral artery without significant stenosis. Right PICA patent. Left PICA not visualized. Basilar widely patent. AICA, superior cerebellar,  and posterior cerebral arteries patent bilaterally without occlusion or significant stenosis. Venous sinuses: Normal venous enhancement Anatomic variants: None Review of the MIP images confirms the above findings IMPRESSION: 1. Negative for intracranial large vessel occlusion or flow limiting stenosis 2. No significant carotid or vertebral artery stenosis in the neck. Minimal atherosclerotic disease. Electronically Signed: By: Franchot Gallo M.D. On: 09/28/2019 16:51   MR BRAIN WO CONTRAST  Result Date: 09/28/2019 CLINICAL DATA:  Aphasia EXAM: MRI HEAD WITHOUT CONTRAST TECHNIQUE:  Multiplanar, multiecho pulse sequences of the brain and surrounding structures were obtained without intravenous contrast. COMPARISON:  04/04/2019 FINDINGS: Brain: There is no acute infarction or intracranial hemorrhage. There is no intracranial mass, mass effect, or edema. There is no hydrocephalus or extra-axial fluid collection. Patchy and confluent areas T2 hyperintensity in the supratentorial matter likely reflect stable moderate chronic microvascular ischemic changes. There are chronic small vessel infarcts of the deep cerebral white matter and thalamus bilaterally. There are multiple foci of susceptibility involving basal ganglia, thalamus cerebral white matter compatible with hypertensive chronic microhemorrhages. Prominence of the ventricles and sulci reflects stable parenchymal volume loss. Vascular: Major vessel flow voids at the skull base are preserved. Skull and upper cervical spine: Normal marrow signal is preserved. Sinuses/Orbits: Trace mucosal thickening.  Orbits are unremarkable. Other: Sella is unremarkable.  Mastoid air cells are clear. IMPRESSION: No evidence of recent infarction, hemorrhage, or mass. Chronic microvascular ischemic changes, chronic small vessel infarcts, and chronic microhemorrhages as seen previously. Electronically Signed   By: Macy Mis M.D.   On: 09/28/2019 13:36   EEG adult  Result Date: 09/30/2019 Lora Havens, MD     09/30/2019 12:18 PM Patient Name: Keith Carter MRN: [id number] Epilepsy Attending: Lora Havens Referring Physician/Provider: Dr Donnetta Simpers Date: 09/30/2019 Duration: 24.51 mins Patient history: 64 y.o. male with a PMHx of HTN, HLD, DM, morbid obesity, anxiety, BPH, prior stroke and TIA who presents via EMS as a Code Stroke for sudden onset of confusion and memory loss. MR Brain didn't show any stroke. EEG to evaluate for seizure. Level of alertness: Awake AEDs during EEG study: None Technical aspects: This EEG study was done with scalp  electrodes positioned according to the 10-20 International system of electrode placement. Electrical activity was acquired at a sampling rate of 500Hz  and reviewed with a high frequency filter of 70Hz  and a low frequency filter of 1Hz . EEG data were recorded continuously and digitally stored. Description: The posterior dominant rhythm consists of 8-9 Hz activity of moderate voltage (25-35 uV) seen predominantly in posterior head regions, symmetric and reactive to eye opening and eye closing. EEG showed intermittent generalized and maximal left temporal region 3 to 6 Hz theta-delta slowing. Hyperventilation and photic stimulation were not performed.   ABNORMALITY -Intermittent slow, generalized and maximal left temporal region IMPRESSION: This study is suggestive of nonspecific cortical dysfunction in left temporal region as well as mild diffuse encephalopathy, nonspecific etiology.  No seizures or epileptiform discharges were seen throughout the recording. Priyanka [name]   Overnight EEG with video  Result Date: 10/01/2019 Lora Havens, MD     10/02/2019 10:11 AM Patient Name: Keith Carter MRN: [id number] Epilepsy Attending: Lora Havens Referring Physician/Provider: Dr Donnetta Simpers Duration: 09/30/2019 1605 to 10/01/2019 1338  Patient history: 64 y.o.malewith a PMHx of HTN, HLD, DM, morbid obesity, anxiety, BPH, prior stroke and TIA who presents via EMS as a Code Stroke for sudden onset of confusion and memory loss. MR Brain didn't show any  stroke. EEG to evaluate for seizure.  Level of alertness: Awake, asleep  AEDs during EEG study: None  Technical aspects: This EEG study was done with scalp electrodes positioned according to the 10-20 International system of electrode placement. Electrical activity was acquired at a sampling rate of 500Hz  and reviewed with a high frequency filter of 70Hz  and a low frequency filter of 1Hz . EEG data were recorded continuously and digitally stored.  Description:  The posterior dominant rhythm consists of 8-9 Hz activity of moderate voltage (25-35 uV) seen predominantly in posterior head regions, symmetric and reactive to eye opening and eye closing. Hyperventilation and photic stimulation were not performed.    IMPRESSION: This study is within normal limits. No seizures or epileptiform discharges were seen throughout the recording.  Lora Havens   ECHOCARDIOGRAM COMPLETE  Result Date: 09/28/2019    ECHOCARDIOGRAM REPORT   Patient Name:   Keith Carter Date of Exam: 09/28/2019 Medical Rec #:  [id number]  Height:       71.0 in Accession #:    [id number] Weight:       271.5 lb Date of Birth:  1955/04/13   BSA:          2.401 m Patient Age:    39 years   BP:           167/94 mmHg Patient Gender: M          HR:           73 bpm. Exam Location:  Inpatient Procedure: 2D Echo, Cardiac Doppler, Color Doppler and Intracardiac            Opacification Agent Indications:    Stroke  History:        Patient has no prior history of Echocardiogram examinations.                 Stroke; Risk Factors:Hypertension and Diabetes.  Sonographer:    Roseanna Rainbow RDCS Referring Phys: [id number] JENNIFER YATES  Sonographer Comments: Technically difficult study due to poor echo windows and patient is morbidly obese. Image acquisition challenging due to patient body habitus. IMPRESSIONS  1. Left ventricular ejection fraction, by estimation, is 50 to 55%. The left ventricle has low normal function. The left ventricle has no regional wall motion abnormalities. There is mild left ventricular hypertrophy. Left ventricular diastolic parameters are indeterminate.  2. Right ventricular systolic function is normal. The right ventricular size is normal. Tricuspid regurgitation signal is inadequate for assessing PA pressure.  3. The mitral valve is grossly normal. Trivial mitral valve regurgitation.  4. The aortic valve is tricuspid. Aortic valve regurgitation is not visualized. Mild aortic valve sclerosis is  present, with no evidence of aortic valve stenosis.  5. The inferior vena cava is normal in size with greater than 50% respiratory variability, suggesting right atrial pressure of 3 mmHg. FINDINGS  Left Ventricle: Left ventricular ejection fraction, by estimation, is 50 to 55%. The left ventricle has low normal function. The left ventricle has no regional wall motion abnormalities. Definity contrast agent was given IV to delineate the left ventricular endocardial borders. The left ventricular internal cavity size was normal in size. There is mild left ventricular hypertrophy. Left ventricular diastolic parameters are indeterminate. Right Ventricle: The right ventricular size is normal. No increase in right ventricular wall thickness. Right ventricular systolic function is normal. Tricuspid regurgitation signal is inadequate for assessing PA pressure. Left Atrium: Left atrial size was normal in size. Right Atrium: Right atrial size was normal  in size. Pericardium: There is no evidence of pericardial effusion. Mitral Valve: The mitral valve is grossly normal. Mild mitral annular calcification. Trivial mitral valve regurgitation. Tricuspid Valve: The tricuspid valve is grossly normal. Tricuspid valve regurgitation is trivial. Aortic Valve: The aortic valve is tricuspid. Aortic valve regurgitation is not visualized. Mild aortic valve sclerosis is present, with no evidence of aortic valve stenosis. Pulmonic Valve: The pulmonic valve was grossly normal. Pulmonic valve regurgitation is not visualized. Aorta: The aortic root is normal in size and structure. Venous: The inferior vena cava is normal in size with greater than 50% respiratory variability, suggesting right atrial pressure of 3 mmHg. IAS/Shunts: No atrial level shunt detected by color flow Doppler.  LEFT VENTRICLE PLAX 2D LVIDd:         4.70 cm      Diastology LVIDs:         3.60 cm      LV e' lateral:   8.22 cm/s LV PW:         1.00 cm      LV E/e' lateral: 5.3  LV IVS:        1.20 cm      LV e' medial:    5.64 cm/s LVOT diam:     2.10 cm      LV E/e' medial:  7.8 LV SV:         70 LV SV Index:   29 LVOT Area:     3.46 cm  LV Volumes (MOD) LV vol d, MOD A2C: 117.0 ml LV vol d, MOD A4C: 104.0 ml LV vol s, MOD A2C: 57.9 ml LV vol s, MOD A4C: 55.2 ml LV SV MOD A2C:     59.1 ml LV SV MOD A4C:     104.0 ml LV SV MOD BP:      55.4 ml RIGHT VENTRICLE             IVC RV S prime:     15.70 cm/s  IVC diam: 1.80 cm TAPSE (M-mode): 2.3 cm LEFT ATRIUM             Index       RIGHT ATRIUM           Index LA diam:        3.60 cm 1.50 cm/m  RA Area:     18.20 cm LA Vol (A2C):   35.6 ml 14.83 ml/m RA Volume:   55.00 ml  22.91 ml/m LA Vol (A4C):   71.2 ml 29.65 ml/m LA Biplane Vol: 54.4 ml 22.66 ml/m  AORTIC VALVE LVOT Vmax:   107.00 cm/s LVOT Vmean:  63.400 cm/s LVOT VTI:    0.201 m  AORTA Ao Root diam: 3.80 cm Ao Asc diam:  3.50 cm MITRAL VALVE MV Area (PHT): 4.68 cm    SHUNTS MV Decel Time: 162 msec    Systemic VTI:  0.20 m MV E velocity: 43.75 cm/s  Systemic Diam: 2.10 cm MV A velocity: 64.70 cm/s MV E/A ratio:  0.68 Rozann Lesches MD Electronically signed by Rozann Lesches MD Signature Date/Time: 09/28/2019/3:55:43 PM    Final    CT HEAD CODE STROKE WO CONTRAST  Result Date: 09/28/2019 CLINICAL DATA:  Code stroke.  Aphasia EXAM: CT HEAD WITHOUT CONTRAST TECHNIQUE: Contiguous axial images were obtained from the base of the skull through the vertex without intravenous contrast. COMPARISON:  04/04/2019 FINDINGS: Brain: No acute intracranial hemorrhage, mass effect, or edema. There is no new loss of gray-white differentiation.  Patchy and confluent areas of hypoattenuation in the supratentorial white matter likely reflect stable moderate chronic microvascular ischemic changes. Prominence of the ventricles and sulci reflects stable parenchymal volume loss. Vascular: No hyperdense vessel. There is intracranial atherosclerotic calcification the skull base. Skull: Unremarkable.  Sinuses/Orbits: No acute or significant abnormality. Other: Mastoid air cells are clear. ASPECTS [facility] Stroke Program Early CT Score) - Ganglionic level infarction (caudate, lentiform nuclei, internal capsule, insula, M1-M3 cortex): 7 - Supraganglionic infarction (M4-M6 cortex): 3 Total score (0-10 with 10 being normal): 10 IMPRESSION: No acute intracranial hemorrhage. No evidence of acute infarction. ASPECT score is 10. Moderate chronic microvascular ischemic changes. These results were communicated to Dr. Cheral Marker at 10:29 amon 8/28/2021by text page via the [facility] messaging system. Electronically Signed   By: Macy Mis M.D.   On: 09/28/2019 10:33     The results of significant diagnostics from this hospitalization (including imaging, microbiology, ancillary and laboratory) are listed below for reference.     Microbiology: Recent Results (from the past 240 hour(s))  SARS Coronavirus 2 by RT PCR (hospital order, performed in [facility] hospital lab) Nasopharyngeal Nasopharyngeal Swab     Status: None   Collection Time: 09/28/19 11:33 AM   Specimen: Nasopharyngeal Swab  Result Value Ref Range Status   SARS Coronavirus 2 NEGATIVE NEGATIVE Final    Comment: (NOTE) SARS-CoV-2 target nucleic acids are NOT DETECTED.  The SARS-CoV-2 RNA is generally detectable in upper and lower respiratory specimens during the acute phase of infection. The lowest concentration of SARS-CoV-2 viral copies this assay can detect is 250 copies / mL. A negative result does not preclude SARS-CoV-2 infection and should not be used as the sole basis for treatment or other patient management decisions.  A negative result may occur with improper specimen collection / handling, submission of specimen other than nasopharyngeal swab, presence of viral mutation(s) within the areas targeted by this assay, and inadequate number of viral copies (<250 copies / mL). A negative result must be combined with  clinical observations, patient history, and epidemiological information.  Fact Sheet for Patients:   StrictlyIdeas.no  Fact Sheet for Healthcare Providers: BankingDealers.co.za  This test is not yet approved or  cleared by the [place] FDA and has been authorized for detection and/or diagnosis of SARS-CoV-2 by FDA under an Emergency Use Authorization (EUA).  This EUA will remain in effect (meaning this test can be used) for the duration of the COVID-19 declaration under Section 564(b)(1) of the Act, 21 U.S.C. section 360bbb-3(b)(1), unless the authorization is terminated or revoked sooner.  Performed at [facility], [place] [address], [place], [place] [address]   SARS CORONAVIRUS 2 (TAT 6-24 HRS) Nasopharyngeal Nasopharyngeal Swab     Status: None   Collection Time: 10/02/19  4:23 PM   Specimen: Nasopharyngeal Swab  Result Value Ref Range Status   SARS Coronavirus 2 NEGATIVE NEGATIVE Final    Comment: (NOTE) SARS-CoV-2 target nucleic acids are NOT DETECTED.  The SARS-CoV-2 RNA is generally detectable in upper and lower respiratory specimens during the acute phase of infection. Negative results do not preclude SARS-CoV-2 infection, do not rule out co-infections with other pathogens, and should not be used as the sole basis for treatment or other patient management decisions. Negative results must be combined with clinical observations, patient history, and epidemiological information. The expected result is Negative.  Fact Sheet for Patients: SugarRoll.be  Fact Sheet for Healthcare Providers: https://www.woods-mathews.com/  This test is not yet approved or cleared by  the [employer] and  has been authorized for detection and/or diagnosis of SARS-CoV-2 by FDA under an Emergency Use Authorization (EUA). This EUA will remain  in effect (meaning this test can be used) for the  duration of the COVID-19 declaration under Se ction 564(b)(1) of the Act, 21 U.S.C. section 360bbb-3(b)(1), unless the authorization is terminated or revoked sooner.  Performed at [facility], [place] [address], [place], [place] [address]      Labs: BNP (last 3 results) No results for input(s): BNP in the last 8760 hours. Basic Metabolic Panel: Recent Labs  Lab 09/28/19 1017 09/28/19 1017 09/28/19 1025 09/29/19 0917 09/30/19 0126 10/01/19 0552 10/02/19 0714  NA 141   < > 142 137 139 139 138  K 4.1   < > 4.1 3.9 3.4* 4.5 4.1  CL 109   < > 108 103 103 108 105  CO2 23  --   --  22 26 22 24   GLUCOSE 165*   < > 158* 161* 138* 142* 139*  BUN 15   < > 18 11 13 17 17   CREATININE 1.18   < > 1.00 0.90 0.99 0.99 1.07  CALCIUM 9.1  --   --  9.0 9.0 8.8* 8.9  MG  --   --   --  1.3* 2.0 1.8 1.9  PHOS  --   --   --  2.3* 3.2 3.2 3.4   < > = values in this interval not displayed.   Liver Function Tests: Recent Labs  Lab 09/28/19 1017 09/29/19 0917 09/30/19 0126 10/01/19 0552 10/02/19 0714  AST 17 17 16 17 24   ALT 15 17 15 15 26   ALKPHOS 73 74 79 77 84  BILITOT 0.9 1.3* 1.2 1.1 1.3*  PROT 6.4* 6.4* 6.2* 6.2* 6.3*  ALBUMIN 3.4* 3.4* 3.2* 3.1* 3.2*   No results for input(s): LIPASE, AMYLASE in the last 168 hours. No results for input(s): AMMONIA in the last 168 hours. CBC: Recent Labs  Lab 09/28/19 1017 09/28/19 1017 09/28/19 1025 09/29/19 0917 09/30/19 0126 10/01/19 0552 10/02/19 0714  WBC 7.4  --   --  8.4 8.9 8.2 7.4  NEUTROABS 4.9  --   --  6.6 5.8 5.2 4.8  HGB 14.6   < > 15.0 14.8 14.1 13.8 14.3  HCT 46.0   < > 44.0 45.3 43.6 43.0 44.6  MCV 92.9  --   --  90.2 89.0 90.0 89.9  PLT 235  --   --  256 252 230 247   < > = values in this interval not displayed.   Cardiac Enzymes: No results for input(s): CKTOTAL, CKMB, CKMBINDEX, TROPONINI in the last 168 hours. BNP: Invalid input(s): POCBNP CBG: Recent Labs  Lab 10/02/19 0604 10/02/19 1205 10/02/19 1610  10/02/19 2032 10/03/19 0605  GLUCAP 137* 228* 166* 145* 155*   D-Dimer No results for input(s): DDIMER in the last 72 hours. Hgb A1c No results for input(s): HGBA1C in the last 72 hours. Lipid Profile No results for input(s): CHOL, HDL, LDLCALC, TRIG, CHOLHDL, LDLDIRECT in the last 72 hours. Thyroid function studies No results for input(s): TSH, T4TOTAL, T3FREE, THYROIDAB in the last 72 hours.  Invalid input(s): FREET3 Anemia work up No results for input(s): VITAMINB12, FOLATE, FERRITIN, TIBC, IRON, RETICCTPCT in the last 72 hours. Urinalysis No results found for: COLORURINE, APPEARANCEUR, [place], [place], GLUCOSEU, [place], BILIRUBINUR, KETONESUR, PROTEINUR, UROBILINOGEN, NITRITE, LEUKOCYTESUR Sepsis Labs Invalid input(s): PROCALCITONIN,  WBC,  LACTICIDVEN Microbiology Recent Results (  from the past 240 hour(s))  SARS Coronavirus 2 by RT PCR (hospital order, performed in [facility] hospital lab) Nasopharyngeal Nasopharyngeal Swab     Status: None   Collection Time: 09/28/19 11:33 AM   Specimen: Nasopharyngeal Swab  Result Value Ref Range Status   SARS Coronavirus 2 NEGATIVE NEGATIVE Final    Comment: (NOTE) SARS-CoV-2 target nucleic acids are NOT DETECTED.  The SARS-CoV-2 RNA is generally detectable in upper and lower respiratory specimens during the acute phase of infection. The lowest concentration of SARS-CoV-2 viral copies this assay can detect is 250 copies / mL. A negative result does not preclude SARS-CoV-2 infection and should not be used as the sole basis for treatment or other patient management decisions.  A negative result may occur with improper specimen collection / handling, submission of specimen other than nasopharyngeal swab, presence of viral mutation(s) within the areas targeted by this assay, and inadequate number of viral copies (<250 copies / mL). A negative result must be combined with clinical observations, patient history, and epidemiological  information.  Fact Sheet for Patients:   StrictlyIdeas.no  Fact Sheet for Healthcare Providers: BankingDealers.co.za  This test is not yet approved or  cleared by the [place] FDA and has been authorized for detection and/or diagnosis of SARS-CoV-2 by FDA under an Emergency Use Authorization (EUA).  This EUA will remain in effect (meaning this test can be used) for the duration of the COVID-19 declaration under Section 564(b)(1) of the Act, 21 U.S.C. section 360bbb-3(b)(1), unless the authorization is terminated or revoked sooner.  Performed at [facility], [place] [address] [place], [place] [address]   SARS CORONAVIRUS 2 (TAT 6-24 HRS) Nasopharyngeal Nasopharyngeal Swab     Status: None   Collection Time: 10/02/19  4:23 PM   Specimen: Nasopharyngeal Swab  Result Value Ref Range Status   SARS Coronavirus 2 NEGATIVE NEGATIVE Final    Comment: (NOTE) SARS-CoV-2 target nucleic acids are NOT DETECTED.  The SARS-CoV-2 RNA is generally detectable in upper and lower respiratory specimens during the acute phase of infection. Negative results do not preclude SARS-CoV-2 infection, do not rule out co-infections with other pathogens, and should not be used as the sole basis for treatment or other patient management decisions. Negative results must be combined with clinical observations, patient history, and epidemiological information. The expected result is Negative.  Fact Sheet for Patients: SugarRoll.be  Fact Sheet for Healthcare Providers: https://www.woods-mathews.com/  This test is not yet approved or cleared by the [place] FDA and  has been authorized for detection and/or diagnosis of SARS-CoV-2 by FDA under an Emergency Use Authorization (EUA). This EUA will remain  in effect (meaning this test can be used) for the duration of the COVID-19 declaration under Se ction  564(b)(1) of the Act, 21 U.S.C. section 360bbb-3(b)(1), unless the authorization is terminated or revoked sooner.  Performed at [facility], [place] [address] [place], [place] [address]      Time coordinating discharge:  I have spent 35 minutes face to face with the patient and on the ward discussing the patients care, assessment, plan and disposition with other care givers. >50% of the time was devoted counseling the patient about the risks and benefits of treatment/Discharge disposition and coordinating care.   SIGNED:   Damita Lack, MD  [facility] 10/03/2019, 8:01 AM   If 7PM-7AM, please contact night-coverage

## 2019-10-03 LAB — GLUCOSE, CAPILLARY
Glucose-Capillary: 155 mg/dL — ABNORMAL HIGH (ref 70–99)
Glucose-Capillary: 194 mg/dL — ABNORMAL HIGH (ref 70–99)
Glucose-Capillary: 182 mg/dL — ABNORMAL HIGH (ref 70–99)
Glucose-Capillary: 137 mg/dL — ABNORMAL HIGH (ref 70–99)

## 2019-10-03 NOTE — TOC Transition Note (Signed)
Transition of Care [facility]) - CM/SW Discharge Note   Patient Details  Name: Tycen Dockter MRN: [id number] Date of Birth: [birth year]  Transition of Care [facility]) CM/SW Contact:  Geralynn Ochs, LCSW Phone Number: 10/03/2019, 3:26 PM   Clinical Narrative:   Nurse to call report to [phone], Room 401    Final next level of care: [place] Barriers to Discharge: Barriers Resolved   Patient Goals and CMS Choice Patient states their goals for this hospitalization and ongoing recovery are:: patient wants to get home, family wants him to get rehab CMS Medicare.gov Compare Post Acute Care list provided to:: Patient Represenative (must comment) Choice offered to / list presented to : Adult Children, Spouse  Discharge Placement              Patient chooses bed at: [facility] Patient to be transferred to facility by: [facility] Name of family member notified: [place]any Patient and family notified of of transfer: 10/03/19  Discharge Plan and Services     Post Acute Care Choice: [place]                               Social Determinants of Health (SDOH) Interventions     Readmission Risk Interventions No flowsheet data found.

## 2019-10-03 NOTE — TOC Progression Note (Signed)
Transition of Care [facility]) - Progression Note    Patient Details  Name: Keith Carter MRN: [id number] Date of Birth: Sep 18, 1955  Transition of Care [facility]) CM/SW [place], [place] Phone Number: 10/03/2019, 12:00 PM  Clinical Narrative:   CSW received update from daughter this morning with choices ([facility] doesn't take outside referrals, [facility] doesn't take patient's insurance, and [facility]). CSW called [facility] to ask about sending referral, and they have no openings at this time. CSW updated patient's daughter and provided bed offers, she responded back that she'd like [facility]. CSW contacted [facility] to ask about admission, and they would need prior authorization through [facility] (will not take the waiver).    Expected Discharge Plan: [place] Barriers to Discharge: Ship broker, Continued Medical Work up  Expected Discharge Plan and Services Expected Discharge Plan: [place] Choice: [place] arrangements for the past 2 months: Single Family Home Expected Discharge Date: 10/03/19                                     Social Determinants of Health (SDOH) Interventions    Readmission Risk Interventions No flowsheet data found.

## 2019-10-03 NOTE — Progress Notes (Signed)
Occupational Therapy Treatment Patient Details Name: Keith Carter MRN: [id number] DOB: [birth year] Today's Date: 10/03/2019    History of present illness Keith Carter is a 64 y.o. male with medical history significant of CVA (04/2019); HTN; HLD; DM; morbid obesity; BPH; and anxiety presenting with aphasia.  He has had TIAs in the past.  He was admitted in March at [facility] for dysarthria/aphasia.  Since then, he has some memory difficulties and periodic expressive aphasia.  His back has been getting worse and so his mobility has not been as good. Neurology consult "TIA vs recrudescence of prior stroke like symptoms... vs seizure." EEG showed slowing left temporal lobe; 24 hour EEG negative for seizure   OT comments  Patient continues to make steady progress towards goals in skilled OT session. Patient's session encompassed functional ambulation and ADLs at sink in order to progress in overall activity tolerance. Pt continues to require cues for walker management and navigation, however demonstrated improvement with regard to transfers on and off of commode and standing balance to complete ADLs at sink. Due to decreased activity tolerance, pt noted to brace bilaterally on forearms in order to complete. Discharge remains appropriate at this time, will continue to follow acutely.    Follow Up Recommendations  SNF;Supervision/Assistance - 24 hour    Equipment Recommendations  3 in 1 bedside commode    Recommendations for Other Services      Precautions / Restrictions Precautions Precautions: Fall Precaution Comments: Pt reports he has had 2 recent falls (at [place] and then a store) Restrictions Weight Bearing Restrictions: No       Mobility Bed Mobility               General bed mobility comments: OOB upon arrival  Transfers Overall transfer level: Needs assistance Equipment used: Rolling walker (2 wheeled) Transfers: Sit to/from Stand Sit to Stand: Min assist;Min guard         General  transfer comment: Increased assist required to complete sit<>stand off commode, but improving with transfers    Balance Overall balance assessment: Needs assistance;History of Falls Sitting-balance support: No upper extremity supported;Feet supported Sitting balance-Leahy Scale: Fair   Postural control: Posterior lean Standing balance support: Bilateral upper extremity supported Standing balance-Leahy Scale: Poor Standing balance comment: can stand for up to 30 sec statically without holding onto RW with close-guarding                           ADL either performed or assessed with clinical judgement   ADL Overall ADL's : Needs assistance/impaired     Grooming: Wash/dry hands;Wash/dry face;Oral care;Brushing hair;Standing;Min guard Grooming Details (indicate cue type and reason): Standing at sink, noted to brace on bilateral forearms due to fatigue                 Toilet Transfer: Minimal assistance;Ambulation;RW;Regular Toilet;Grab bars   Toileting- Clothing Manipulation and Hygiene: Minimal assistance;Sit to/from stand;Cueing for safety;Cueing for sequencing       Functional mobility during ADLs: Minimal assistance;Rolling walker;Cueing for safety;Cueing for sequencing General ADL Comments: pt with increased mobilty in session, however continues to fatigue with increased standing tolerance and demands     Vision       Perception     Praxis      Cognition Arousal/Alertness: Awake/alert Behavior During Therapy: Flat affect Overall Cognitive Status: No family/caregiver present to determine baseline cognitive functioning Area of Impairment: Attention;Memory;Following commands;Safety/judgement;Awareness;Problem solving  Current Attention Level: Selective Memory: Decreased recall of precautions;Decreased short-term memory Following Commands: Follows one step commands with increased time Safety/Judgement: Decreased awareness of  safety;Decreased awareness of deficits Awareness: Emergent Problem Solving: Slow processing;Requires verbal cues General Comments: Continues to require cues for walker managment and overall awareness of hosptial room        Exercises     Shoulder Instructions       General Comments      Pertinent Vitals/ Pain       Pain Assessment: Faces Faces Pain Scale: Hurts a little bit Pain Location: generalized with movement Pain Descriptors / Indicators: Aching;Discomfort Pain Intervention(s): Limited activity within patient's tolerance;Monitored during session;Repositioned  Home Living                                          Prior Functioning/Environment              Frequency  Min 2X/week        Progress Toward Goals  OT Goals(current goals can now be found in the care plan section)  Progress towards OT goals: Progressing toward goals  Acute Rehab OT Goals Patient Stated Goal: to go home OT Goal Formulation: With patient Time For Goal Achievement: 10/13/19 Potential to Achieve Goals: Good  Plan Discharge plan remains appropriate    Co-evaluation                 AM-PAC OT "6 Clicks" Daily Activity     Outcome Measure   Help from another person eating meals?: None Help from another person taking care of personal grooming?: A Little Help from another person toileting, which includes using toliet, bedpan, or urinal?: A Little Help from another person bathing (including washing, rinsing, drying)?: A Lot Help from another person to put on and taking off regular upper body clothing?: A Little Help from another person to put on and taking off regular lower body clothing?: A Lot 6 Click Score: 17    End of Session Equipment Utilized During Treatment: Gait belt;Rolling walker  OT Visit Diagnosis: Unsteadiness on feet (R26.81);Other abnormalities of gait and mobility (R26.89);History of falling (Z91.81);Muscle weakness (generalized)  (M62.81);Other symptoms and signs involving cognitive function;Pain   Activity Tolerance Patient tolerated treatment well   Patient Left in chair;with call bell/phone within reach;with chair alarm set   Nurse Communication Mobility status        Time: 7482-7078 OT Time Calculation (min): 33 min  Charges: OT General Charges $OT Visit: 1 Visit OT Treatments $Self Care/Home Management : 23-37 mins  H. Rivera Colon. [place], [place] [facility] [name] 10/03/2019, 2:10 PM

## 2019-10-03 NOTE — Progress Notes (Signed)
Patient is up and walking with physical therapy, no complaints at this time.  No acute events overnight. Vital signs remained stable.  He still stable for discharge.  Discussed with patient's RN and TOC team.  Keith Ren MD [facility]

## 2019-10-03 NOTE — Progress Notes (Signed)
Physical Therapy Treatment Patient Details Name: Keith Carter MRN: [id number] DOB: [birth year] Today's Date: 10/03/2019    History of Present Illness Keith Carter is a 64 y.o. male with medical history significant of CVA (04/2019); HTN; HLD; DM; morbid obesity; BPH; and anxiety presenting with aphasia.  He has had TIAs in the past.  He was admitted in March at [facility] for dysarthria/aphasia.  Since then, he has some memory difficulties and periodic expressive aphasia.  His back has been getting worse and so his mobility has not been as good. Neurology consult "TIA vs recrudescence of prior stroke like symptoms... vs seizure." EEG showed slowing left temporal lobe; 24 hour EEG negative for seizure    PT Comments    Patient eager to improve. Continues to struggle with short-term memory (could not recall technique to pass through narrow space <5 minutes after PT demonstrated technique to him). REquired assist to identify rt front leg/wheel of RW tangled with object and not allowing him to advance RW x 2.     Follow Up Recommendations  SNF (If pt refuses SNF - then would rec [facility])     Equipment Recommendations  None recommended by PT    Recommendations for Other Services       Precautions / Restrictions Precautions Precautions: Fall Precaution Comments: Pt reports he has had 2 recent falls (at [place] and then a store)    Mobility  Bed Mobility Overal bed mobility: Needs Assistance Bed Mobility: Rolling;Sidelying to Sit Rolling: Modified independent (Device/Increase time) (with rail) Sidelying to sit: HOB elevated;Min guard (HOB 10 with rail; exit to his rt (as home) incr effort/time)          Transfers Overall transfer level: Needs assistance Equipment used: Rolling walker (2 wheeled) Transfers: Sit to/from Stand Sit to Stand: Min guard;Min assist         General transfer comment: slow processing with incr time; assist from bed at normal height; guarding from recliner with armrests and  momentum  Ambulation/Gait Ambulation/Gait assistance: Min assist Gait Distance (Feet): 90 Feet Assistive device: Rolling walker (2 wheeled) Gait Pattern/deviations: Trunk flexed;Decreased stride length;Shuffle Gait velocity: decr   General Gait Details: set up room to require pt to practice turning sideways with RW through narrow spaces and demonstrated to pt what I wanted him to do when he reached the narrow spots; pt could not recall when got to space and allowed him 2 minutes to try to figure out how to pass through, ultimately required step by step instructions; required vc x 2 to untangle rt side of RW from objects that were preventing him from progressing steps   Stairs             Wheelchair Mobility    Modified Rankin (Stroke Patients Only)       Balance Overall balance assessment: Needs assistance;History of Falls Sitting-balance support: No upper extremity supported;Feet supported Sitting balance-Leahy Scale: Fair (>fair NT)     Standing balance support: Bilateral upper extremity supported Standing balance-Leahy Scale: Poor                              Cognition Arousal/Alertness: Awake/alert Behavior During Therapy: Flat affect Overall Cognitive Status: No family/caregiver present to determine baseline cognitive functioning Area of Impairment: Attention;Memory;Following commands;Safety/judgement;Awareness;Problem solving                 Orientation Level:  (NT) Current Attention Level: Selective Memory: Decreased recall of precautions;Decreased short-term  memory Following Commands: Follows one step commands with increased time Safety/Judgement: Decreased awareness of safety;Decreased awareness of deficits Awareness: Emergent Problem Solving: Slow processing;Requires verbal cues        Exercises Other Exercises Other Exercises: Seated: ankle pumps x 20, marching x 20 with emphasis on RLE matching LLE (RLE weaker)    General  Comments        Pertinent Vitals/Pain Pain Assessment: No/denies pain    Home Living                      Prior Function            PT Goals (current goals can now be found in the care plan section) Acute Rehab PT Goals Patient Stated Goal: to go home Time For Goal Achievement: 10/11/19 Potential to Achieve Goals: Good Progress towards PT goals: Progressing toward goals    Frequency    Min 3X/week      PT Plan Current plan remains appropriate    Co-evaluation              AM-PAC PT "6 Clicks" Mobility   Outcome Measure  Help needed turning from your back to your side while in a flat bed without using bedrails?: A Little Help needed moving from lying on your back to sitting on the side of a flat bed without using bedrails?: A Little Help needed moving to and from a bed to a chair (including a wheelchair)?: A Little Help needed standing up from a chair using your arms (e.g., wheelchair or bedside chair)?: A Little Help needed to walk in hospital room?: A Little Help needed climbing 3-5 steps with a railing? : A Lot 6 Click Score: 17    End of Session Equipment Utilized During Treatment: Gait belt Activity Tolerance: Patient tolerated treatment well Patient left: with call bell/phone within reach;in chair;with chair alarm set Nurse Communication: Mobility status;Other (comment) (pt wants to wash hair after EEG gel) PT Visit Diagnosis: Unsteadiness on feet (R26.81);Repeated falls (R29.6);Difficulty in walking, not elsewhere classified (R26.2);Muscle weakness (generalized) (M62.81)     Time: 6834-1962 PT Time Calculation (min) (ACUTE ONLY): 30 min  Charges:  $Gait Training: 23-37 mins                      Arby Barrette, PT Pager [phone]    Rexanne Mano 10/03/2019, 9:32 AM

## 2019-10-03 NOTE — Progress Notes (Signed)
Report given to [facility] at [facility].

## 2019-10-03 NOTE — Progress Notes (Signed)
Attempted twice to give report to [facility]. Charge nurse aware.

## 2019-10-08 DIAGNOSIS — R4701 Aphasia: Secondary | ICD-10-CM | POA: Insufficient documentation

## 2019-10-23 ENCOUNTER — Ambulatory Visit: Payer: Medicare HMO | Admitting: Internal Medicine

## 2019-10-23 NOTE — Progress Notes (Deleted)
   Follow-up Outpatient Visit Date: 10/23/2019  Primary Care Provider: [name], NP [place] [address]  Chief Complaint: ***  HPI:  Keith Carter is a 64 y.o. male with history of stroke, hypertension, hyperlipidemia, BPH, and obesity, who presents for follow-up of stroke.  He was seen once in our office by Laurann Montana, NP, in March following hospitalization for stroke in early March.  No significant carotid disease was noted at that time.  Small shunt could not be excluded on bubble study due to poor windows.  Subsequent event monitor showed rare PACs and PVCs and 3 brief atrial runs.  There was now evidence of atrial fibrillation/flutter.  --------------------------------------------------------------------------------------------------  Past Medical History:  Diagnosis Date  . Anxiety   . Dyslipidemia   . Enlarged prostate   . Hypertension   . Prediabetes   . TIA (transient ischemic attack)    Past Surgical History:  Procedure Laterality Date  . BACK SURGERY    . HERNIA REPAIR      No outpatient medications have been marked as taking for the 10/23/19 encounter (Appointment) with Kimsey Demaree, Keith Gave, MD.    Allergies: Patient has no known allergies.  Social History   Tobacco Use  . Smoking status: Never Smoker  . Smokeless tobacco: Never Used  Substance Use Topics  . Alcohol use: Yes    Comment: social  . Drug use: Never    Family History  Problem Relation Age of Onset  . Stroke Neg Hx     Review of Systems: A 12-system review of systems was performed and was negative except as noted in the HPI.  --------------------------------------------------------------------------------------------------  Physical Exam: There were no vitals taken for this visit.  General:  *** HEENT: No conjunctival pallor or scleral icterus. Facemask in place. Neck: Supple without lymphadenopathy, thyromegaly, JVD, or HJR. Lungs: Normal work of breathing.  Clear to auscultation bilaterally without wheezes or crackles. Heart: Regular rate and rhythm without murmurs, rubs, or gallops. Non-displaced PMI. Abd: Bowel sounds present. Soft, NT/ND without hepatosplenomegaly Ext: No lower extremity edema. Radial, PT, and DP pulses are 2+ bilaterally. Skin: Warm and dry without rash.  EKG:  ***  Lab Results  Component Value Date   WBC 7.4 10/02/2019   HGB 14.3 10/02/2019   HCT 44.6 10/02/2019   MCV 89.9 10/02/2019   PLT 247 10/02/2019    Lab Results  Component Value Date   NA 138 10/02/2019   K 4.1 10/02/2019   CL 105 10/02/2019   CO2 24 10/02/2019   BUN 17 10/02/2019   CREATININE 1.07 10/02/2019   GLUCOSE 139 (H) 10/02/2019   ALT 26 10/02/2019    Lab Results  Component Value Date   CHOL 122 09/29/2019   HDL 49 09/29/2019   LDLCALC 54 09/29/2019   TRIG 96 09/29/2019   CHOLHDL 2.5 09/29/2019    --------------------------------------------------------------------------------------------------  ASSESSMENT AND PLAN: Keith Carter Keith Garry, MD 10/23/2019 7:47 AM

## 2019-10-24 ENCOUNTER — Encounter: Payer: Self-pay | Admitting: Internal Medicine

## 2020-01-03 ENCOUNTER — Other Ambulatory Visit: Payer: Self-pay | Admitting: Nurse Practitioner

## 2020-01-03 DIAGNOSIS — G8929 Other chronic pain: Secondary | ICD-10-CM

## 2020-01-27 ENCOUNTER — Ambulatory Visit
Admission: RE | Admit: 2020-01-27 | Discharge: 2020-01-27 | Disposition: A | Discharge status: Home / Self Care | Payer: Medicare HMO | Source: Ambulatory Visit | Attending: Nurse Practitioner | Admitting: Nurse Practitioner

## 2020-01-27 ENCOUNTER — Other Ambulatory Visit: Payer: Self-pay

## 2020-01-27 DIAGNOSIS — G8929 Other chronic pain: Secondary | ICD-10-CM | POA: Diagnosis present

## 2020-01-27 DIAGNOSIS — M545 Low back pain, unspecified: Secondary | ICD-10-CM | POA: Insufficient documentation

## 2020-03-15 DIAGNOSIS — M1990 Unspecified osteoarthritis, unspecified site: Secondary | ICD-10-CM | POA: Insufficient documentation

## 2020-03-15 DIAGNOSIS — Z79899 Other long term (current) drug therapy: Secondary | ICD-10-CM | POA: Insufficient documentation

## 2020-03-15 DIAGNOSIS — M899 Disorder of bone, unspecified: Secondary | ICD-10-CM | POA: Insufficient documentation

## 2020-03-15 DIAGNOSIS — G894 Chronic pain syndrome: Secondary | ICD-10-CM | POA: Insufficient documentation

## 2020-03-15 DIAGNOSIS — N529 Male erectile dysfunction, unspecified: Secondary | ICD-10-CM | POA: Insufficient documentation

## 2020-03-15 DIAGNOSIS — F419 Anxiety disorder, unspecified: Secondary | ICD-10-CM | POA: Insufficient documentation

## 2020-03-15 DIAGNOSIS — Z789 Other specified health status: Secondary | ICD-10-CM | POA: Insufficient documentation

## 2020-03-15 DIAGNOSIS — I10 Essential (primary) hypertension: Secondary | ICD-10-CM | POA: Insufficient documentation

## 2020-03-15 NOTE — Progress Notes (Signed)
Patient: Keith Carter  Service Category: E/M  Provider: Gaspar Cola, MD  DOB: Jan 07, 1956  DOS: 03/16/2020  Referring Provider: Ezequiel Kayser, MD  MRN: [id number]  Setting: Ambulatory outpatient  PCP: [name], NP  Type: New Patient  Specialty: Interventional Pain Management    Location: Office  Delivery: Face-to-face     Primary Reason(s) for Visit: Encounter for initial evaluation of one or more chronic problems (new to examiner) potentially causing chronic pain, and posing a threat to normal musculoskeletal function. (Level of risk: High) CC: Back Pain (Lumbar midline s/p surgery Dr Izora Ribas approx 3 years ago)  HPI  Mr. Keith Carter is a 65 y.o. year old, male patient, who comes for the first time to our practice referred by Ezequiel Kayser, MD for our initial evaluation of his chronic pain. He has Stroke [facility]); Acute CVA (cerebrovascular accident) ([place]); Dysarthria; Type 2 diabetes mellitus with hyperlipidemia ([place]); Essential hypertension; Obesity (BMI 30-39.9); Benign prostatic hyperplasia without lower urinary tract symptoms; Aphasia; TIA (transient ischemic attack); Anxiety; Arthritis; Chronic low back pain; Erectile dysfunction; Expressive aphasia; History of stroke; 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[place]); Pain of both elbows; Hypertension; Controlled type 2 diabetes mellitus without complication, without long-term current use of insulin ([place]); Chronic pain syndrome; Pharmacologic therapy; Disorder of skeletal system; Problems influencing health status; Abnormal MRI, lumbar spine (01/10/2017); Epidural lipomatosis; Lumbosacral facet hypertrophy (Multilevel) (Bilateral); Lumbar facet joint syndrome (Bilateral); Osteoarthritis of facet joint of lumbar spine; Lumbar lateral recess stenosis (Multilevel) (Bilateral); Lumbosacral foraminal stenosis (Multilevel) (Bilateral); Lumbar central spinal stenosis, 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 Today he comes in for evaluation of his Back Pain (Lumbar midline s/p surgery Dr Izora Ribas approx 3 years ago)  Pain Assessment: Location: Lower,Mid Back Radiating: denies Onset: More than a month ago Duration: Chronic pain Quality: Discomfort,Constant,Sharp (hurts a lot) Severity: 8 /10 (subjective, self-reported pain score)  Effect on ADL: very limiting as to what he is able to do. Timing: Constant Modifying factors: nothing currently, back is worse now than prior to the surgery BP: (!) 143/96  HR: 84  Onset and Duration: Sudden and Present longer than 3 months Cause of pain: Work related accident or event Severity: Getting worse, NAS-11 at its worse: 10/10, NAS-11 at its best: 7/10, NAS-11 now: 7/10 and NAS-11 on the average: 7/10 Timing: After a period of immobility Aggravating Factors: Bending, Kneeling, Lifiting, Prolonged sitting, Prolonged standing, Squatting, Stooping , Surgery made it worse, Walking uphill and Walking downhill Alleviating Factors: Lying down and Sitting Associated Problems: Inability to control bladder (urine) and Personality changes Quality of Pain: Deep, Disabling, Exhausting, Getting longer, Sharp, Stabbing and Work related Previous Examinations or Tests: CT scan, MRI scan and Nerve block Previous Treatments: Pool exercises  According to the patient the onset of symptoms was rather gradual, having started many years ago.  Approximately 3 years ago he had to undergo a lumbar decompression and fusion by Dr. Cari Caraway.  Prior to the surgery the patient indicates that he was experiencing low back pain and right lower extremity pain but the low back pain was worse than the leg pain.  After the surgery the lower extremity pain completely went away  and the low back pain improved.  However, with time he has gotten worse.  The patient indicates that he is  on Plavix secondary to having had a stroke with deficit in terms of his speech.  He seems to have some degree of aphasia.  According to the patient and his primary pain is that of the lower back in the midline (Bilateral) (R>L).  He has had 1 back surgery performed by Dr. Cari Caraway approximately 3 years ago.  He did have physical therapy and he refers having had some nerve blocks in the past 2 of which were done at [facility], the first of which did help him and the pain went away for several days the next 1 did not.  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  Currently he refers having absolutely no lower extremity pain.  He comes into the clinic today in a wheelchair accompanied by his wife also comes in a wheelchair secondary to her Parkinson's.  He refers that while he sitting down he is not having any pain, but when he stands up or he is standing for any kind of prolonged period time like cleaning dishes, his back pain begins to worsen.  He also refers recently having seen Dr. Cari Caraway, couple weeks ago, but he refers that they have not mentioned any type of surgical options.  Pharmacotherapy: The patient refers having tried some NSAIDs, but they were stopped secondary to his Plavix.  He also refers having tried some gabapentin which at one point he was taking anywhere from 10 to 14 pills/day, but he refers that it only made him tired and it did not really help the pain.  He denies having tried any Lyrica.  He does have a current primary care physician.  Today I took the time to provide the patient with information regarding my pain practice. The patient was informed that my practice is divided into two sections: an interventional pain management section,  as well as a completely separate and distinct medication management section. I explained that I have procedure days for my interventional therapies, and evaluation days for follow-ups and medication management. Because of the amount of documentation required during both, they are kept separated. This means that there is the possibility that he may be scheduled for a procedure on one day, and medication management the next. I have also informed him that because of staffing and facility limitations, I no longer take patients for medication management only. To illustrate the reasons for this, I gave the patient the example of surgeons, and how inappropriate it would be to refer a patient to his/her care, just to write for the post-surgical antibiotics on a surgery done by a different surgeon.   Because interventional pain management is my board-certified specialty, the patient was informed that joining my practice means that they are open to any and all interventional therapies. I made it clear that this does not mean that they will be forced to have any procedures done. What this means is that I believe interventional therapies to be essential part of the diagnosis and proper management of chronic pain conditions. Therefore, patients not interested in these interventional alternatives will be better served under the care of a different practitioner.  The patient was also made aware of my Comprehensive Pain Management Safety Guidelines where by joining my practice, they limit all of their nerve blocks and joint injections to those done by our practice, for as long  as we are retained to manage their care.   Historic Controlled Substance Pharmacotherapy Review  PMP and historical list of controlled substances: Tramadol 50 mg tablet, 1 tab p.o. daily (50 mg/day of tramadol) (5 MME/day) Current opioid analgesics: None MME/day: 0 mg/day  Historical Monitoring: The patient  reports no history of drug use. List of  all UDS Test(s): Lab Results  Component Value Date   MDMA NONE DETECTED 04/04/2019   COCAINSCRNUR NONE DETECTED 09/28/2019   COCAINSCRNUR NONE DETECTED 04/04/2019   PCPSCRNUR NONE DETECTED 04/04/2019   THCU NONE DETECTED 09/28/2019   THCU NONE DETECTED 04/04/2019   List of other Serum/Urine Drug Screening Test(s):  Lab Results  Component Value Date   COCAINSCRNUR NONE DETECTED 09/28/2019   COCAINSCRNUR NONE DETECTED 04/04/2019   THCU NONE DETECTED 09/28/2019   THCU NONE DETECTED 04/04/2019   Historical Background Evaluation: [place] PMP: PDMP reviewed during this encounter. Online review of the past 16-monthperiod conducted.             PMP NARX Score Report:  Narcotic: 060 Sedative: 030 Stimulant: 000 [place] Department of public safety, offender search: (Editor, commissioningInformation) Non-contributory Risk Assessment Profile: Aberrant behavior: None observed or detected today Risk factors for fatal opioid overdose: None identified today PMP NARX Overdose Risk Score: 190 Fatal overdose hazard ratio (HR): Calculation deferred Non-fatal overdose hazard ratio (HR): Calculation deferred Risk of opioid abuse or dependence: 0.7-3.0% with doses ? 36 MME/day and 6.1-26% with doses ? 120 MME/day. Substance use disorder (SUD) risk level: See below Personal History of Substance Abuse (SUD-Substance use disorder):  Alcohol: Negative  Illegal Drugs: Negative  Rx Drugs: Negative  ORT Risk Level calculation: Low Risk  Opioid Risk Tool - 03/16/20 0915      Family History of Substance Abuse   Alcohol Negative    Illegal Drugs Negative    Rx Drugs Negative      Personal History of Substance Abuse   Alcohol Negative    Illegal Drugs Negative    Rx Drugs Negative      Psychological Disease   Psychological Disease Negative    Depression Negative      Total Score   Opioid Risk Tool Scoring 0    Opioid Risk Interpretation Low Risk          ORT Scoring interpretation table:  Score <3 = Low Risk  for SUD  Score between 4-7 = Moderate Risk for SUD  Score >8 = High Risk for Opioid Abuse   PHQ-2 Depression Scale:  Total score:    PHQ-2 Scoring interpretation table: (Score and probability of major depressive disorder)  Score 0 = No depression  Score 1 = 15.4% Probability  Score 2 = 21.1% Probability  Score 3 = 38.4% Probability  Score 4 = 45.5% Probability  Score 5 = 56.4% Probability  Score 6 = 78.6% Probability   PHQ-9 Depression Scale:  Total score:    PHQ-9 Scoring interpretation table:  Score 0-4 = No depression  Score 5-9 = Mild depression  Score 10-14 = Moderate depression  Score 15-19 = Moderately severe depression  Score 20-27 = Severe depression (2.4 times higher risk of SUD and 2.89 times higher risk of overuse)   Pharmacologic Plan: As per protocol, I have not taken over any controlled substance management, pending the results of ordered tests and/or consults.            Initial impression: Pending review of available data and ordered tests.  Meds   Current  Outpatient Medications:  .  acetaminophen (TYLENOL) 500 MG tablet, Take 500 mg by mouth every 6 (six) hours as needed. , Disp: , Rfl:  .  amLODipine (NORVASC) 5 MG tablet, Take 1 tablet (5 mg total) by mouth daily., Disp: 30 tablet, Rfl: 0 .  aspirin 81 MG EC tablet, Take 81 mg by mouth daily., Disp: , Rfl:  .  atorvastatin (LIPITOR) 40 MG tablet, Take 1 tablet (40 mg total) by mouth daily at 6 PM., Disp: 30 tablet, Rfl: 0 .  celecoxib (CELEBREX) 200 MG capsule, Take 200 mg by mouth daily., Disp: , Rfl:  .  Cholecalciferol 25 MCG (1000 UT) tablet, Take 5,000 Units by mouth daily. , Disp: , Rfl:  .  clopidogrel (PLAVIX) 75 MG tablet, Take by mouth., Disp: , Rfl:  .  lisinopril (ZESTRIL) 40 MG tablet, Take 40 mg by mouth at bedtime. , Disp: , Rfl:  .  metFORMIN (GLUCOPHAGE-XR) 500 MG 24 hr tablet, Take 500 mg by mouth 2 (two) times daily., Disp: , Rfl:  .  PARoxetine (PAXIL) 40 MG tablet, Take 40 mg by mouth at  bedtime. , Disp: , Rfl:  .  Potassium 99 MG TABS, Take by mouth daily., Disp: , Rfl:  .  sildenafil (REVATIO) 20 MG tablet, Take 20 mg by mouth daily as needed., Disp: , Rfl:  .  tamsulosin (FLOMAX) 0.4 MG CAPS capsule, Take 0.8 mg by mouth at bedtime. , Disp: , Rfl:   Imaging Review  Lumbosacral Imaging: Lumbar MR wo contrast: Results for orders placed during the hospital encounter of 01/10/17 MR LUMBAR SPINE WO CONTRAST  Narrative CLINICAL DATA:  Low back and right leg pain.  Foot numbness.  EXAM: MRI LUMBAR SPINE WITHOUT CONTRAST  TECHNIQUE: Multiplanar, multisequence MR imaging of the lumbar spine was performed. No intravenous contrast was administered.  COMPARISON:  None.  FINDINGS: Segmentation:  Standard.  Alignment: Mild-to-moderate lumbar dextroscoliosis. Slight retrolisthesis of L2 on L3 and L3 on L4.  Vertebrae: No fracture, osseous lesion, or significant marrow edema. Mild type 2 degenerative endplate changes throughout the lumbar spine.  Conus medullaris and cauda equina: Conus extends to the T12-L1 level. Conus and cauda equina appear normal.  Paraspinal and other soft tissues: Partially visualized small T2 hyperintense lesions in both kidneys, likely cysts but incompletely evaluated.  Disc levels:  Disc desiccation throughout the lumbar spine. Moderate disc space narrowing from L1-2 to L3-4 with milder narrowing at L4-5 and L5-S1.  T12-L1: Mild facet arthrosis and minimal disc bulging without stenosis.  L1-2: Disc bulging greater to the left and mild right and mild-to-moderate left facet arthrosis result in mild left lateral recess and mild to moderate left neural foraminal stenosis without spinal stenosis.  L2-3: Circumferential disc bulging greater to the left, a left subarticular to foraminal disc osteophyte complex, and mild facet and ligamentum flavum hypertrophy result in severe left lateral recess and severe left neural foraminal stenosis  with potential left L2 and L3 nerve root impingement. Borderline to mild spinal stenosis.  L3-4: Circumferential disc bulging, ligamentum flavum thickening, severe facet arthrosis, and prominent dorsal epidural fat result in severe spinal stenosis, left greater than right lateral recess stenosis, and moderate right and severe left neural foraminal stenosis. Potential bilateral L3 and left L4 nerve root impingement.  L4-5: Disc bulging asymmetric to the right, a moderate-sized right paracentral/subarticular disc protrusion, ligamentum flavum thickening, and severe facet arthrosis result in severe right lateral recess and severe right neural foraminal stenosis with potential L4 and  L5 nerve root impingement as well as mild spinal stenosis.  L5-S1: Disc bulging and severe facet arthrosis result in mild bilateral lateral recess stenosis and moderate right and severe left neural foraminal stenosis with potential bilateral L5 nerve root impingement. No significant spinal stenosis.  IMPRESSION: 1. Lumbar dextroscoliosis with advanced disc and facet degeneration. 2. Severe spinal stenosis at L3-4. 3. Severe right lateral recess and right neural foraminal stenosis at L4-5. 4. Moderate right and severe left foraminal stenosis at L5-S1. 5. Severe left lateral recess and left neural foraminal stenosis at L2-3.   Electronically Signed By: Logan Bores M.D. On: 01/10/2017 14:31  Lumbar CT wo contrast: Results for orders placed during the hospital encounter of 01/27/20 CT LUMBAR SPINE WO CONTRAST  Narrative CLINICAL DATA:  Chronic back pain.  Lumbar fusion 2 years ago.  EXAM: CT LUMBAR SPINE WITHOUT CONTRAST  TECHNIQUE: Multidetector CT imaging of the lumbar spine was performed without intravenous contrast administration. Multiplanar CT image reconstructions were also generated.  COMPARISON:  CT lumbar spine 08/08/2017  FINDINGS: Segmentation: Normal  Alignment: Mild lumbar  scoliosis. Slight retrolisthesis L2-3 unchanged.  Vertebrae: Negative for fracture or mass.  Hardware: Interval pedicle screw and posterior rod fusion extending from L2 through the sacrum and iliac bone bilaterally. Interbody spacers L2-3, L3-4, L4-5, L5-S1 in satisfactory position. No hardware failure. There is lucency around the L2 screws bilaterally left greater than right consistent with loosening. Left L2 screw extends through the superior endplate of L2.  Paraspinal and other soft tissues: Negative for paraspinous mass, adenopathy, or soft tissue edema.  Disc levels: T12-L1: Mild disc and facet degeneration. Negative for stenosis  L1-2: Progressive disc degeneration with extensive gas in the disc space. Diffuse endplate spurring and bilateral facet degeneration. Severe left foraminal encroachment is unchanged. Mild spinal stenosis.  L2-3: Pedicle screw and interbody fusion. Diffuse vertebral endplate spurring and bilateral facet degeneration. Moderate to severe left foraminal encroachment due to spurring, with slight improvement. Mild spinal stenosis.  L3-4: Pedicle screw and interbody fusion. Diffuse endplate spurring and bilateral facet hypertrophy. Severe left foraminal stenosis with slight improvement. Moderate right foraminal stenosis.  L4-5: Pedicle screw and interbody fusion. Right laminectomy. Moderate right foraminal stenosis due to spurring, with interval improvement. Spinal canal adequate in size  L5-S1: Pedicle screw and interbody fusion. Mild foraminal narrowing bilaterally due to spurring.  IMPRESSION: 1. Interval pedicle screw and interbody fusion L2 through S1. There is loosening of the L2 screws bilaterally. 2. Progressive disc degeneration at L1-2 with severe left foraminal encroachment unchanged. 3. Moderate to severe left foraminal encroachment L2-3 with mild improvement 4. Severe left foraminal encroachment L3-4 with mild improvement. 5.  Moderate right foraminal encroachment due to spurring at L4-5 with mild improvement.   Electronically Signed By: Franchot Gallo M.D. On: 01/27/2020 10:41  Complexity Note: Imaging results reviewed. Results shared with Mr. Baggerly, using Layman's terms.                        ROS  Cardiovascular: Daily Aspirin intake Pulmonary or Respiratory: No reported pulmonary signs or symptoms such as wheezing and difficulty taking a deep full breath (Asthma), difficulty blowing air out (Emphysema), coughing up mucus (Bronchitis), persistent dry cough, or temporary stoppage of breathing during sleep Neurological: Stroke (Residual deficits or weakness: none) Psychological-Psychiatric: No reported psychological or psychiatric signs or symptoms such as difficulty sleeping, anxiety, depression, delusions or hallucinations (schizophrenial), mood swings (bipolar disorders) or suicidal ideations or attempts Gastrointestinal: No  reported gastrointestinal signs or symptoms such as vomiting or evacuating blood, reflux, heartburn, alternating episodes of diarrhea and constipation, inflamed or scarred liver, or pancreas or irrregular and/or infrequent bowel movements Genitourinary: No reported renal or genitourinary signs or symptoms such as difficulty voiding or producing urine, peeing blood, non-functioning kidney, kidney stones, difficulty emptying the bladder, difficulty controlling the flow of urine, or chronic kidney disease Hematological: Brusing easily Endocrine: No reported endocrine signs or symptoms such as high or low blood sugar, rapid heart rate due to high thyroid levels, obesity or weight gain due to slow thyroid or thyroid disease Rheumatologic: No reported rheumatological signs and symptoms such as fatigue, joint pain, tenderness, swelling, redness, heat, stiffness, decreased range of motion, with or without associated rash Musculoskeletal: Negative for myasthenia gravis, muscular dystrophy, multiple  sclerosis or malignant hyperthermia Work History: Retired  Allergies  Mr. Jablonowski has No Known Allergies.  Laboratory Chemistry Profile   Renal Lab Results  Component Value Date   BUN 17 10/02/2019   CREATININE 1.07 10/02/2019   GFRAA >60 10/02/2019   GFRNONAA >60 10/02/2019     Electrolytes Lab Results  Component Value Date   NA 138 10/02/2019   K 4.1 10/02/2019   CL 105 10/02/2019   CALCIUM 8.9 10/02/2019   MG 1.9 10/02/2019   PHOS 3.4 10/02/2019     Hepatic Lab Results  Component Value Date   AST 24 10/02/2019   ALT 26 10/02/2019   ALBUMIN 3.2 (L) 10/02/2019   ALKPHOS 84 10/02/2019     ID Lab Results  Component Value Date   HIV NON REACTIVE 04/05/2019   [place] NEGATIVE 10/02/2019     Bone No results found for: VD25OH, VD125OH2TOT, XN2355DD2, KG2542HC6, 25OHVITD1, 25OHVITD2, 25OHVITD3, TESTOFREE, TESTOSTERONE   Endocrine Lab Results  Component Value Date   GLUCOSE 139 (H) 10/02/2019   HGBA1C 7.1 (H) 09/28/2019   TSH 0.746 09/28/2019     Neuropathy Lab Results  Component Value Date   HGBA1C 7.1 (H) 09/28/2019   HIV NON REACTIVE 04/05/2019     CNS No results found for: COLORCSF, APPEARCSF, RBCCOUNTCSF, WBCCSF, POLYSCSF, LYMPHSCSF, EOSCSF, PROTEINCSF, GLUCCSF, JCVIRUS, CSFOLI, IGGCSF, LABACHR, ACETBL, LABACHR, ACETBL   Inflammation (CRP: Acute  ESR: Chronic) No results found for: CRP, ESRSEDRATE, LATICACIDVEN   Rheumatology No results found for: RF, ANA, LABURIC, URICUR, LYMEIGGIGMAB, LYMEABIGMQN, HLAB27   Coagulation Lab Results  Component Value Date   INR 1.0 09/28/2019   LABPROT 13.0 09/28/2019   APTT 32 09/28/2019   PLT 247 10/02/2019     Cardiovascular Lab Results  Component Value Date   HGB 14.3 10/02/2019   HCT 44.6 10/02/2019     Screening Lab Results  Component Value Date   SARSCOV2NAA NEGATIVE 10/02/2019   HIV NON REACTIVE 04/05/2019     Cancer No results found for: CEA, CA125, LABCA2   Allergens No results found  for: ALMOND, APPLE, ASPARAGUS, AVOCADO, BANANA, BARLEY, BASIL, BAYLEAF, GREENBEAN, LIMABEAN, WHITEBEAN, BEEFIGE, REDBEET, BLUEBERRY, BROCCOLI, CABBAGE, MELON, CARROT, CASEIN, CASHEWNUT, CAULIFLOWER, CELERY     Note: Lab results reviewed.  PFSH  Drug: Mr. Wohl  reports no history of drug use. Alcohol:  reports current alcohol use. Tobacco:  reports that he has never smoked. He has never used smokeless tobacco. Medical:  has a past medical history of Anxiety, Dyslipidemia, Enlarged prostate, Hypertension, Prediabetes, and TIA (transient ischemic attack). Family: family history is not on file.  Past Surgical History:  Procedure Laterality Date  . BACK SURGERY    .  HERNIA REPAIR     Active Ambulatory Problems    Diagnosis Date Noted  . Stroke ([place]) 04/04/2019  . Acute CVA (cerebrovascular accident) ([place]) 04/05/2019  . Dysarthria   . Type 2 diabetes mellitus with hyperlipidemia ([place])   . Essential hypertension   . Obesity (BMI 30-39.9)   . Benign prostatic hyperplasia without lower urinary tract symptoms   . Aphasia 09/28/2019  . TIA (transient ischemic attack) 09/29/2019  . Anxiety 03/15/2020  . Arthritis 03/15/2020  . Chronic low back pain 11/27/2013  . Erectile dysfunction 03/15/2020  . Expressive aphasia 10/08/2019  . History of stroke 05/09/2019  . 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[place]) 09/16/2014  . Pain of both elbows 04/14/2015  . Hypertension 03/15/2020  . Controlled type 2 diabetes mellitus without complication, without long-term current use of insulin ([place]) 09/06/2017  . Chronic pain syndrome 03/15/2020  . Pharmacologic therapy 03/15/2020  . Disorder of skeletal system 03/15/2020  . Problems influencing health status 03/15/2020  . Abnormal MRI, lumbar spine (01/10/2017) 03/16/2020  . 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  . Lumbosacral facet hypertrophy (Multilevel) (B[place]) 03/16/2020  . Lumbar facet joint syndrome (Bilateral) 03/16/2020  .  Osteoarthritis of facet joint of lumbar spine 03/16/2020  . Lumbar lateral recess stenosis (Multilevel) (Bilateral) 03/16/2020  . Lumbosacral foraminal stenosis (Multilevel) (Bilateral) 03/16/2020  . 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  . Dextroscoliosis of lumbar spine 03/16/2020  . Lumbar Grade 1 Retrolisthesis of L2/L3 and L3/L4 03/16/2020  . Abnormal CT scan, lumbar spine (01/27/2020) 03/16/2020  . Elevated hemoglobin A1c 03/16/2020  . Chronic anticoagulation (PLAVIX) 03/16/2020   Resolved Ambulatory Problems    Diagnosis Date Noted  . No Resolved Ambulatory Problems   Past Medical History:  Diagnosis Date  . Dyslipidemia   . Enlarged prostate   . Prediabetes    Constitutional Exam  General appearance: Well nourished, well developed, and well hydrated. In no apparent acute distress Vitals:   03/16/20 0903  BP: (!) 143/96  Pulse: 84  Resp: 16  Temp: (!) 97.1 F (36.2 C)  TempSrc: Temporal  SpO2: 97%  Weight: 250 lb (113.4 kg)  Height: 5' 9"  (1.753 m)   BMI Assessment: Estimated body mass index is 36.92 kg/m as calculated from the following:   Height as of this encounter: 5' 9"  (1.753 m).   Weight as of this encounter: 250 lb (113.4 kg).  BMI interpretation table: BMI level Category Range association with higher incidence of chronic pain  <18 kg/m2 Underweight   18.5-24.9 kg/m2 Ideal body weight   25-29.9 kg/m2 Overweight Increased incidence by 20%  30-34.9 kg/m2 Obese (Class I) Increased incidence by 68%  35-39.9 kg/m2 Severe obesity (Class II) Increased incidence by 136%  >40 kg/m2 Extreme obesity (Class III) Increased incidence by 254%   Patient's current BMI Ideal Body weight  Body mass index is 36.92 kg/m. Ideal body weight: 70.7 kg (155 lb 13.8 oz) Adjusted ideal body weight: 87.8 kg (193 lb 8.3 oz)   BMI Readings from Last 4 Encounters:  03/16/20 36.92 kg/m   09/28/19 37.87 kg/m  04/19/19 37.87 kg/m  04/04/19 38.15 kg/m   Wt Readings from Last 4 Encounters:  03/16/20 250 lb (113.4 kg)  04/19/19 271 lb 8 oz (123.2 kg)  04/04/19 273 lb 8 oz (124.1 kg)    Psych/Mental status: Alert, oriented x 3 (person, place, & time)       Eyes: PERLA Respiratory: No evidence of  acute respiratory distress  Assessment  Primary Diagnosis & Pertinent Problem List: The primary encounter diagnosis was Chronic pain syndrome. 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  Visit Diagnosis (New problems to examiner): 1. Chronic pain syndrome   2. Lumbar Grade 1 Retrolisthesis of L2/L3 and L3/L4   3. Lumbosacral facet hypertrophy (Multilevel) (Bilateral)   4. Osteoarthritis of facet joint of lumbar spine   5. Lumbar facet joint syndrome (Bilateral)   6. Dextroscoliosis of lumbar spine   7. Failed back surgical syndrome   8. Lumbar lateral recess stenosis (Multilevel) (Bilateral)   9. Lumbosacral foraminal stenosis (Multilevel) (Bilateral)   10. 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 Epidural lipomatosis   12. Abnormal MRI, lumbar spine (01/10/2017)   13. Abnormal CT scan, lumbar spine (01/27/2020)   14. Pharmacologic therapy    15. Disorder of skeletal system   16. Problems influencing health status   17. Elevated hemoglobin A1c   18. Chronic anticoagulation (PLAVIX)    Plan of Care (Initial workup plan)  Note: Mr. Gosse was reminded that as per protocol, today's visit has been an evaluation only. We have not taken over the patient's controlled substance management.  Problem-specific plan: No problem-specific Assessment & Plan notes found for this encounter.   Lab Orders     Compliance Drug Analysis, Ur     Comp. Metabolic Panel (12)     Magnesium     Vitamin B12     Sedimentation rate     25-Hydroxy vitamin D Lcms D2+D3     C-reactive protein     Hemoglobin A1c  Imaging Orders     DG Lumbar Spine Complete W/Bend Referral Orders  No referral(s) requested today   Procedure Orders    No procedure(s) ordered today   Pharmacotherapy (current): Medications ordered:  No orders of the defined types were placed in this encounter.  Medications administered during this visit: Carlynn Purl had no medications administered during this visit.   Pharmacological management options:  Opioid Analgesics: The patient was informed that there is no guarantee that he would be a candidate for opioid analgesics. The decision will be made following CDC guidelines. This decision will be based on the results of diagnostic studies, as well as Mr. Humbarger risk profile.   Membrane stabilizer: To be determined at a later time  Muscle relaxant: To be determined at a later time  NSAID: To be determined at a later time  Other analgesic(s): To be determined at a later time   Interventional management options: Mr. Corron was informed that there is no guarantee that he would be a candidate for interventional therapies. The decision will be based on the results of diagnostic studies, as well as Mr. Lesch risk profile.  Procedure(s) under consideration:  PLAVIX ANTICOAGULATION (Stop: 7 days  Restart: 2 hours) Diagnostic  bilateral lumbar facet block #1    Provider-requested follow-up: Return for (F2F), (2V) post-test eval (40 min), (s/p Tests).  No future appointments.  Note by: Gaspar Cola, MD Date: 03/16/2020; Time: 10:05 AM

## 2020-03-16 ENCOUNTER — Encounter: Payer: Self-pay | Admitting: Pain Medicine

## 2020-03-16 ENCOUNTER — Ambulatory Visit: Payer: Medicare HMO | Admitting: Pain Medicine

## 2020-03-16 ENCOUNTER — Other Ambulatory Visit: Payer: Self-pay

## 2020-03-16 ENCOUNTER — Ambulatory Visit
Admission: RE | Admit: 2020-03-16 | Discharge: 2020-03-16 | Disposition: A | Discharge status: Home / Self Care | Payer: Medicare HMO | Source: Ambulatory Visit | Attending: Pain Medicine | Admitting: Pain Medicine

## 2020-03-16 VITALS — BP 143/96 | HR 84 | Temp 97.1°F | Resp 16 | Ht 69.0 in | Wt 250.0 lb

## 2020-03-16 DIAGNOSIS — M961 Postlaminectomy syndrome, not elsewhere classified: Secondary | ICD-10-CM

## 2020-03-16 DIAGNOSIS — M48061 Spinal stenosis, lumbar region without neurogenic claudication: Secondary | ICD-10-CM

## 2020-03-16 DIAGNOSIS — M4186 Other forms of scoliosis, lumbar region: Secondary | ICD-10-CM | POA: Insufficient documentation

## 2020-03-16 DIAGNOSIS — M47816 Spondylosis without myelopathy or radiculopathy, lumbar region: Secondary | ICD-10-CM | POA: Insufficient documentation

## 2020-03-16 DIAGNOSIS — M4807 Spinal stenosis, lumbosacral region: Secondary | ICD-10-CM | POA: Insufficient documentation

## 2020-03-16 DIAGNOSIS — M4310 Spondylolisthesis, site unspecified: Secondary | ICD-10-CM | POA: Insufficient documentation

## 2020-03-16 DIAGNOSIS — D1779 Benign lipomatous neoplasm of other sites: Secondary | ICD-10-CM | POA: Insufficient documentation

## 2020-03-16 DIAGNOSIS — G894 Chronic pain syndrome: Secondary | ICD-10-CM

## 2020-03-16 DIAGNOSIS — R7309 Other abnormal glucose: Secondary | ICD-10-CM | POA: Insufficient documentation

## 2020-03-16 DIAGNOSIS — M48062 Spinal stenosis, lumbar region with neurogenic claudication: Secondary | ICD-10-CM

## 2020-03-16 DIAGNOSIS — E882 Lipomatosis, not elsewhere classified: Secondary | ICD-10-CM | POA: Insufficient documentation

## 2020-03-16 DIAGNOSIS — Z789 Other specified health status: Secondary | ICD-10-CM

## 2020-03-16 DIAGNOSIS — M47817 Spondylosis without myelopathy or radiculopathy, lumbosacral region: Secondary | ICD-10-CM | POA: Insufficient documentation

## 2020-03-16 DIAGNOSIS — M899 Disorder of bone, unspecified: Secondary | ICD-10-CM | POA: Insufficient documentation

## 2020-03-16 DIAGNOSIS — Z7901 Long term (current) use of anticoagulants: Secondary | ICD-10-CM

## 2020-03-16 DIAGNOSIS — R937 Abnormal findings on diagnostic imaging of other parts of musculoskeletal system: Secondary | ICD-10-CM | POA: Insufficient documentation

## 2020-03-16 DIAGNOSIS — Z79899 Other long term (current) drug therapy: Secondary | ICD-10-CM

## 2020-03-16 NOTE — Patient Instructions (Signed)
____________________________________________________________________________________________  General Risks and Possible Complications  Patient Responsibilities: It is important that you read this as it is part of your informed consent. It is our duty to inform you of the risks and possible complications associated with treatments offered to you. It is your responsibility as a patient to read this and to ask questions about anything that is not clear or that you believe was not covered in this document.  Patient's Rights: You have the right to refuse treatment. You also have the right to change your mind, even after initially having agreed to have the treatment done. However, under this last option, if you wait until the last second to change your mind, you may be charged for the materials used up to that point.  Introduction: Medicine is not an exact science. Everything in Medicine, including the lack of treatment(s), carries the potential for danger, harm, or loss (which is by definition: Risk). In Medicine, a complication is a secondary problem, condition, or disease that can aggravate an already existing one. All treatments carry the risk of possible complications. The fact that a side effects or complications occurs, does not imply that the treatment was conducted incorrectly. It must be clearly understood that these can happen even when everything is done following the highest safety standards.  No treatment: You can choose not to proceed with the proposed treatment alternative. The "PRO(s)" would include: avoiding the risk of complications associated with the therapy. The "CON(s)" would include: not getting any of the treatment benefits. These benefits fall under one of three categories: diagnostic; therapeutic; and/or palliative. Diagnostic benefits include: getting information which can ultimately lead to improvement of the disease or symptom(s). Therapeutic benefits are those associated with the  successful treatment of the disease. Finally, palliative benefits are those related to the decrease of the primary symptoms, without necessarily curing the condition (example: decreasing the pain from a flare-up of a chronic condition, such as incurable terminal cancer).  General Risks and Complications: These are associated to most interventional treatments. They can occur alone, or in combination. They fall under one of the following six (6) categories: no benefit or worsening of symptoms; bleeding; infection; nerve damage; allergic reactions; and/or death. 1. No benefits or worsening of symptoms: In Medicine there are no guarantees, only probabilities. No healthcare provider can ever guarantee that a medical treatment will work, they can only state the probability that it may. Furthermore, there is always the possibility that the condition may worsen, either directly, or indirectly, as a consequence of the treatment. 2. Bleeding: This is more common if the patient is taking a blood thinner, either prescription or over the counter (example: Goody Powders, Fish oil, Aspirin, Garlic, etc.), or if suffering a condition associated with impaired coagulation (example: Hemophilia, cirrhosis of the liver, low platelet counts, etc.). However, even if you do not have one on these, it can still happen. If you have any of these conditions, or take one of these drugs, make sure to notify your treating physician. 3. Infection: This is more common in patients with a compromised immune system, either due to disease (example: diabetes, cancer, human immunodeficiency virus [HIV], etc.), or due to medications or treatments (example: therapies used to treat cancer and rheumatological diseases). However, even if you do not have one on these, it can still happen. If you have any of these conditions, or take one of these drugs, make sure to notify your treating physician. 4. Nerve Damage: This is more common when the   treatment is  an invasive one, but it can also happen with the use of medications, such as those used in the treatment of cancer. The damage can occur to small secondary nerves, or to large primary ones, such as those in the spinal cord and brain. This damage may be temporary or permanent and it may lead to impairments that can range from temporary numbness to permanent paralysis and/or brain death. 5. Allergic Reactions: Any time a substance or material comes in contact with our body, there is the possibility of an allergic reaction. These can range from a mild skin rash (contact dermatitis) to a severe systemic reaction (anaphylactic reaction), which can result in death. 6. Death: In general, any medical intervention can result in death, most of the time due to an unforeseen complication. ____________________________________________________________________________________________   

## 2020-03-23 LAB — COMPLIANCE DRUG ANALYSIS, UR

## 2020-03-26 LAB — COMP. METABOLIC PANEL (12)
AST: 14 IU/L (ref 0–40)
Albumin/Globulin Ratio: 1.2 (ref 1.2–2.2)
Albumin: 3.8 g/dL (ref 3.8–4.8)
Alkaline Phosphatase: 97 IU/L (ref 44–121)
BUN/Creatinine Ratio: 17 (ref 10–24)
BUN: 19 mg/dL (ref 8–27)
Bilirubin Total: 0.6 mg/dL (ref 0.0–1.2)
Calcium: 9.5 mg/dL (ref 8.6–10.2)
Chloride: 100 mmol/L (ref 96–106)
Creatinine, Ser: 1.13 mg/dL (ref 0.76–1.27)
GFR calc Af Amer: 78 mL/min/{1.73_m2} (ref >59)
GFR calc non Af Amer: 67 mL/min/{1.73_m2} (ref >59)
Globulin, Total: 3.1 g/dL (ref 1.5–4.5)
Glucose: 217 mg/dL — ABNORMAL HIGH (ref 65–99)
Potassium: 5.4 mmol/L — ABNORMAL HIGH (ref 3.5–5.2)
Sodium: 138 mmol/L (ref 134–144)
Total Protein: 6.9 g/dL (ref 6.0–8.5)

## 2020-03-26 LAB — 25-HYDROXY VITAMIN D LCMS D2+D3
25-Hydroxy, Vitamin D-2: <1 ng/mL
25-Hydroxy, Vitamin D-3: 28 ng/mL
25-Hydroxy, Vitamin D: 28 ng/mL — ABNORMAL LOW

## 2020-03-26 LAB — MAGNESIUM: Magnesium: 2 mg/dL (ref 1.6–2.3)

## 2020-03-26 LAB — SEDIMENTATION RATE: Sed Rate: 8 mm/hr (ref 0–30)

## 2020-03-26 LAB — VITAMIN B12: Vitamin B-12: 256 pg/mL (ref 232–1245)

## 2020-03-26 LAB — C-REACTIVE PROTEIN: CRP: 1 mg/L (ref 0–10)

## 2020-03-26 LAB — HEMOGLOBIN A1C
Est. average glucose Bld gHb Est-mCnc: 177 mg/dL
Hgb A1c MFr Bld: 7.8 % — ABNORMAL HIGH (ref 4.8–5.6)

## 2020-04-20 NOTE — Progress Notes (Signed)
PROVIDER NOTE: Information contained herein reflects review and annotations entered in association with encounter. Interpretation of such information and data should be left to medically-trained personnel. Information provided to patient can be located elsewhere in the medical record under "Patient Instructions". Document created using STT-dictation technology, any transcriptional errors that may result from process are unintentional.    Patient: Keith Carter  Service Category: E/M  Provider: Gaspar Cola, MD  DOB: 1955-12-31  DOS: 04/22/2020  Specialty: Interventional Pain Management  MRN: [id number]  Setting: Ambulatory outpatient  PCP: [name], NP  Type: Established Patient    Referring Provider: [name], *  Location: Office  Delivery: Face-to-face     Primary Reason(s) for Visit: Encounter for evaluation before starting new chronic pain management plan of care (Level of risk: moderate) CC: Back Pain (low)  HPI  Keith Carter is a 65 y.o. year old, male patient, who comes today for a follow-up evaluation to review the test results and decide on a treatment plan. He has Stroke [facility]); Acute CVA (cerebrovascular accident) ([place]); Dysarthria; Type 2 diabetes mellitus with hyperlipidemia ([place]); Essential hypertension; Obesity (BMI 30-39.9); Benign prostatic hyperplasia without lower urinary tract symptoms; Aphasia; TIA (transient ischemic attack); Anxiety; Arthritis; Chronic low back pain; Erectile dysfunction; Expressive aphasia; History of stroke; 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[place]); Pain of both elbows; Hypertension; Controlled type 2 diabetes mellitus without complication, without long-term current use of insulin ([place]); Chronic pain syndrome; Pharmacologic therapy; Disorder of skeletal system; Problems influencing health status; Abnormal MRI, lumbar spine (01/10/2017); Epidural lipomatosis; Lumbosacral facet hypertrophy (Multilevel) (Bilateral); Lumbar  facet joint syndrome (Bilateral); Osteoarthritis of facet joint of lumbar spine; Lumbar lateral recess stenosis (Multilevel) (Bilateral); Lumbosacral foraminal stenosis (Multilevel) (Bilateral); Lumbar central spinal stenosis, 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 His primarily concern today is the Back Pain (low)  Pain Assessment: Location: Lower Back Radiating: denies Onset: More than a month ago Duration: Chronic pain Quality: Sharp Severity: 8 /10 (subjective, self-reported pain score)  Timing: Constant Modifying factors: rest, topicals, whirpool BP: (!) 150/90  HR: 73  Keith Carter comes in today for a follow-up visit after his initial evaluation on 03/16/2020. Today we went over the results of his tests. These were explained in "Layman's terms". During today's appointment we went over my diagnostic impression, as well as the proposed treatment plan.  According to the patient the onset of symptoms was rather gradual, having started many years ago.  Approximately 3 years ago he had to undergo a lumbar decompression and fusion by Keith Carter.  Prior to the surgery the patient indicates that he was experiencing low back pain and right lower extremity pain but the low back pain was worse than the leg pain.  After the surgery the lower extremity pain completely went away and the low back pain improved.  However, with time he has gotten worse.  The patient indicates that he is on Plavix secondary to having had a stroke with deficit in terms of his speech.  He seems to have some degree of  aphasia.  According to the patient and his primary pain is that of the lower back in the midline (  Bilateral) (R>L).  He has had one back surgery performed by Keith Carter approximately 3 years ago.  He did have physical therapy and he refers having had some nerve blocks in the past 2 of which were done at [facility], the first of which did help him and the pain went away for several days the next 1 did not.  He also refers having had some nerve blocks done in [place], at a doctor's office (they think he was a pain specialist) but not only to did not help but he refers that it hurt him.  Currently he refers having absolutely no lower extremity pain.  He comes into the clinic today in a wheelchair accompanied by his wife also comes in a wheelchair secondary to her Parkinson's.  He refers that while he sitting down he is not having any pain, but when he stands up or he is standing for any kind of prolonged period time like cleaning dishes, his back pain begins to worsen.  He also refers recently having seen Keith Carter, couple weeks ago, but he refers that they have not mentioned any type of surgical options.  Pharmacotherapy: The patient refers having tried some NSAIDs, but they were stopped secondary to his Plavix.  He also refers having tried some gabapentin which at one point he was taking anywhere from 10 to 14 pills/day, but he refers that it only made him tired and it did not really help the pain.  He denies having tried any Lyrica.  He does have a current primary care physician.  He returns today after having had the flexion-extension x-rays of the lumbar spine done on 03/16/2020 which revealed:  IMPRESSION: 1. Probable mild anterior translation of L1 on L2 with baseline anterolisthesis of L1 on L2 approximately 7 mm, increasing to 9-10 mm with flexion. 2. Lucency about the L2 pedicle screws bilaterally similar compared to recent CT, suggests underlying loosening as on the recent CT.  Lab  work revealed his hemoglobin A1c to be worsening, currently at 7.8 and previously at 7.1.  The patient also was shown to have a vitamin D insufficiency and his blood glucose and potassium to be elevated.  His urine drug screen test came back positive for ibuprofen, which he should not be taking since he is taking Plavix.  The patient has PMP reveals that the only C2 medication he seems to have taken in the past is tramadol, which was given to him at 50 mg p.o. daily.  I had a very long conversation with the patient and his wife regarding the findings and what each 1 of those meant, using layman's terms.  The patient was instructed not to take any ibuprofen or any other type of nonsteroidal anti-inflammatory drug while on a blood thinner.  Today we will send a letter to the prescribing physician for the Plavix to see if we can get clearance to stop it for 7 days prior to procedures.  Today I have informed the patient of our plan of action which includes doing a diagnostic lumbar facet block of the L1-2 facet joints.  The patient was oriented to the fact that if this is successful in eliminating the pain, at least for the duration of the local anesthetic, he may be a good candidate for radiofrequency ablation.  The patient was also instructed to bring his weight down to a BMI of 30 kilograms per square meter and to take some over-the-counter vitamin D since he was found to have an insufficiency.  The patient was also instructed to talk to his primary care physician to see if they can identify the reason for his hyperkalemia.  In considering the treatment plan options, Mr. Tolbert was reminded that I no longer take patients for medication management only. I asked him to let me know if he had no intention of taking advantage of the interventional therapies, so that we could make arrangements to provide this space to someone interested. I also made it clear that undergoing interventional therapies for the purpose of  getting pain medications is very inappropriate on the part of a patient, and it will not be tolerated in this practice. This type of behavior would suggest true addiction and therefore it requires referral to an addiction specialist.   Further details on both, my assessment(s), as well as the proposed treatment plan, please see below.  Controlled Substance Pharmacotherapy Assessment REMS (Risk Evaluation and Mitigation Strategy)  Analgesic: None MME/day: 0 mg/day  Pill Count: None expected due to no prior prescriptions written by our practice. Hart Rochester, RN  04/22/2020 11:29 AM  Sign when Signing Visit Safety precautions to be maintained throughout the outpatient stay will include: orient to surroundings, keep bed in low position, maintain call bell within reach at all times, provide assistance with transfer out of bed and ambulation.    Pharmacokinetics: Liberation and absorption (onset of action): WNL Distribution (time to peak effect): WNL Metabolism and excretion (duration of action): WNL         Pharmacodynamics: Desired effects: Analgesia: Mr. Rising reports >50% benefit. Functional ability: Patient reports that medication allows him to accomplish basic ADLs Clinically meaningful improvement in function (CMIF): Sustained CMIF goals met Perceived effectiveness: Described as relatively effective, allowing for increase in activities of daily living (ADL) Undesirable effects: Side-effects or Adverse reactions: None reported Monitoring: [place] PMP: PDMP reviewed during this encounter. Online review of the past 44-monthperiod previously conducted. Not applicable at this point since we have not taken over the patient's medication management yet. List of other Serum/Urine Drug Screening Test(s):  Lab Results  Component Value Date   COCAINSCRNUR NONE DETECTED 09/28/2019   COCAINSCRNUR NONE DETECTED 04/04/2019   THCU NONE DETECTED 09/28/2019   THCU NONE DETECTED 04/04/2019   List  of all UDS test(s) done:  Lab Results  Component Value Date   SUMMARY FINAL 03/16/2020   Last UDS on record: Summary  Date Value Ref Range Status  03/16/2020 FINAL  Final    Comment:    ==================================================================== TOXASSURE COMP DRUG ANALYSIS,UR ==================================================================== Test                             Result       Flag       Units Drug Present and Declared for Prescription Verification   Paroxetine                     PRESENT      EXPECTED  Drug Present not Declared for Prescription Verification   Ibuprofen                      PRESENT      UNEXPECTED  Drug Absent but Declared for Prescription Verification   Acetaminophen                  Not Detected UNEXPECTED    Acetaminophen, as indicated in the declared medication list, is  not always detected even when used as directed.    Salicylate                     Not Detected UNEXPECTED    Aspirin, as indicated in the declared medication list, is not    always detected even when used as directed.  ==================================================================== Test                      Result    Flag   Units      Ref Range   Creatinine              300              mg/dL      >=20 ==================================================================== Declared Medications:  The flagging and interpretation on this report are based on the  following declared medications.  Unexpected results may arise from  inaccuracies in the declared medications.   **Note: The testing scope of this panel includes these medications:   Paroxetine (Paxil)   **Note: The testing scope of this panel does not include small to  moderate amounts of these reported medications:   Acetaminophen (Tylenol)  Aspirin (Aspirin 81)   **Note: The testing scope of this panel does not include following  reported medications:   Amlodipine (Norvasc)  Atorvastatin  (Lipitor)  Celecoxib (Celebrex)  Cholecalciferol  Clopidogrel (Plavix)  Lisinopril (Zestril)  Metformin  Potassium  Sildenafil (Revatio)  Tamsulosin (Flomax) ==================================================================== For clinical consultation, please call [phone]. ====================================================================    UDS interpretation: Unexpected findings: Patient found to be taking ibuprofen despite the fact that he is on Plavix.  Patient informed of the risk of doing this and instructed not to take any nonsteroidal anti-inflammatory drugs while on the blood thinner. Medication Assessment Form: Not applicable. Treatment compliance: Not applicable Risk Assessment Profile: Aberrant behavior: See initial evaluations. None observed or detected today Comorbid factors increasing risk of overdose: See initial evaluation. No additional risks detected today Opioid risk tool (ORT):  Opioid Risk  03/16/2020  Alcohol 0  Illegal Drugs 0  Rx Drugs 0  Alcohol 0  Illegal Drugs 0  Rx Drugs 0  Psychological Disease 0  Depression 0  Opioid Risk Tool Scoring 0  Opioid Risk Interpretation Low Risk    ORT Scoring interpretation table:  Score <3 = Low Risk for SUD  Score between 4-7 = Moderate Risk for SUD  Score >8 = High Risk for Opioid Abuse   Risk of substance use disorder (SUD): Low  Risk Mitigation Strategies:  Patient opioid safety counseling: Not applicable. Patient-Prescriber Agreement (PPA): No agreement signed.  Controlled substance notification to other providers: Not applicable  Pharmacologic Plan: No opioid analgesic prescribed.             Laboratory Chemistry Profile   Renal Lab Results  Component Value Date   BUN 19 03/16/2020   CREATININE 1.13 03/16/2020   BCR 17 03/16/2020   GFRAA 78 03/16/2020   GFRNONAA 67 03/16/2020     Electrolytes Lab Results  Component Value Date   NA 138 03/16/2020   K 5.4 (H) 03/16/2020   CL 100  03/16/2020   CALCIUM 9.5 03/16/2020   MG 2.0 03/16/2020   PHOS 3.4 10/02/2019     Hepatic Lab Results  Component Value Date   AST 14 03/16/2020   ALT 26 10/02/2019   ALBUMIN 3.8 03/16/2020   ALKPHOS 97 03/16/2020     ID Lab Results  Component Value Date   HIV NON REACTIVE 04/05/2019   SARSCOV2NAA NEGATIVE 10/02/2019     Bone Lab Results  Component Value Date   25OHVITD1 28 (L) 03/16/2020   25OHVITD2 <1.0 03/16/2020   25OHVITD3 28 03/16/2020     Endocrine Lab Results  Component Value Date   GLUCOSE 217 (H) 03/16/2020   HGBA1C 7.8 (H) 03/16/2020   TSH 0.746 09/28/2019     Neuropathy Lab Results  Component Value Date   VITAMINB12 256 03/16/2020   HGBA1C 7.8 (H) 03/16/2020   HIV NON REACTIVE 04/05/2019     CNS No results found for: COLORCSF, APPEARCSF, RBCCOUNTCSF, WBCCSF, POLYSCSF, LYMPHSCSF, EOSCSF, PROTEINCSF, GLUCCSF, JCVIRUS, CSFOLI, IGGCSF, LABACHR, ACETBL, LABACHR, ACETBL   Inflammation (CRP: Acute  ESR: Chronic) Lab Results  Component Value Date   CRP 1 03/16/2020   ESRSEDRATE 8 03/16/2020     Rheumatology No results found for: RF, ANA, LABURIC, URICUR, LYMEIGGIGMAB, LYMEABIGMQN, HLAB27   Coagulation Lab Results  Component Value Date   INR 1.0 09/28/2019   LABPROT 13.0 09/28/2019   APTT 32 09/28/2019   PLT 247 10/02/2019     Cardiovascular Lab Results  Component Value Date   HGB 14.3 10/02/2019   HCT 44.6 10/02/2019     Screening Lab Results  Component Value Date   SARSCOV2NAA NEGATIVE 10/02/2019   HIV NON REACTIVE 04/05/2019     Cancer No results found for: CEA, CA125, LABCA2   Allergens No results found for: ALMOND, APPLE, ASPARAGUS, AVOCADO, BANANA, BARLEY, BASIL, BAYLEAF, GREENBEAN, LIMABEAN, WHITEBEAN, BEEFIGE, REDBEET, BLUEBERRY, BROCCOLI, CABBAGE, MELON, CARROT, CASEIN, CASHEWNUT, CAULIFLOWER, CELERY     Note: Lab results reviewed.  Recent Diagnostic Imaging Review  Lumbosacral Imaging: Lumbar MR wo contrast: Results  for orders placed during the hospital encounter of 01/10/17  MR LUMBAR SPINE WO CONTRAST  Narrative CLINICAL DATA:  Low back and right leg pain.  Foot numbness.  EXAM: MRI LUMBAR SPINE WITHOUT CONTRAST  TECHNIQUE: Multiplanar, multisequence MR imaging of the lumbar spine was performed. No intravenous contrast was administered.  COMPARISON:  None.  FINDINGS: Segmentation:  Standard.  Alignment: Mild-to-moderate lumbar dextroscoliosis. Slight retrolisthesis of L2 on L3 and L3 on L4.  Vertebrae: No fracture, osseous lesion, or significant marrow edema. Mild type 2 degenerative endplate changes throughout the lumbar spine.  Conus medullaris and cauda equina: Conus extends to the T12-L1 level. Conus and cauda equina appear normal.  Paraspinal and other soft tissues: Partially visualized small T2 hyperintense lesions in both kidneys, likely cysts but incompletely evaluated.  Disc levels:  Disc desiccation throughout the lumbar spine. Moderate disc space narrowing from L1-2 to L3-4 with milder narrowing at L4-5 and L5-S1.  T12-L1: Mild facet arthrosis and minimal disc bulging without stenosis.  L1-2: Disc bulging greater to the left and mild right and mild-to-moderate left facet arthrosis result in mild left lateral recess and mild to moderate left neural foraminal stenosis without spinal stenosis.  L2-3: Circumferential disc bulging greater to the left, a left subarticular to foraminal disc osteophyte complex, and mild facet and ligamentum flavum hypertrophy result in severe left lateral recess and severe left neural foraminal stenosis with potential left L2 and L3 nerve root impingement. Borderline to mild spinal stenosis.  L3-4: Circumferential disc bulging, ligamentum flavum thickening, severe facet arthrosis, and prominent dorsal epidural fat result in severe spinal stenosis, left greater than right lateral recess stenosis, and moderate right and severe left  neural foraminal stenosis. Potential bilateral L3 and left L4 nerve root impingement.  L4-5:  Disc bulging asymmetric to the right, a moderate-sized right paracentral/subarticular disc protrusion, ligamentum flavum thickening, and severe facet arthrosis result in severe right lateral recess and severe right neural foraminal stenosis with potential L4 and L5 nerve root impingement as well as mild spinal stenosis.  L5-S1: Disc bulging and severe facet arthrosis result in mild bilateral lateral recess stenosis and moderate right and severe left neural foraminal stenosis with potential bilateral L5 nerve root impingement. No significant spinal stenosis.  IMPRESSION: 1. Lumbar dextroscoliosis with advanced disc and facet degeneration. 2. Severe spinal stenosis at L3-4. 3. Severe right lateral recess and right neural foraminal stenosis at L4-5. 4. Moderate right and severe left foraminal stenosis at L5-S1. 5. Severe left lateral recess and left neural foraminal stenosis at L2-3.   Electronically Signed By: Logan Bores M.D. On: 01/10/2017 14:31  Lumbar CT wo contrast: Results for orders placed during the hospital encounter of 01/27/20 CT LUMBAR SPINE WO CONTRAST  Narrative CLINICAL DATA:  Chronic back pain.  Lumbar fusion 2 years ago.  EXAM: CT LUMBAR SPINE WITHOUT CONTRAST  TECHNIQUE: Multidetector CT imaging of the lumbar spine was performed without intravenous contrast administration. Multiplanar CT image reconstructions were also generated.  COMPARISON:  CT lumbar spine 08/08/2017  FINDINGS: Segmentation: Normal  Alignment: Mild lumbar scoliosis. Slight retrolisthesis L2-3 unchanged.  Vertebrae: Negative for fracture or mass.  Hardware: Interval pedicle screw and posterior rod fusion extending from L2 through the sacrum and iliac bone bilaterally. Interbody spacers L2-3, L3-4, L4-5, L5-S1 in satisfactory position. No hardware failure. There is lucency around the  L2 screws bilaterally left greater than right consistent with loosening. Left L2 screw extends through the superior endplate of L2.  Paraspinal and other soft tissues: Negative for paraspinous mass, adenopathy, or soft tissue edema.  Disc levels: T12-L1: Mild disc and facet degeneration. Negative for stenosis  L1-2: Progressive disc degeneration with extensive gas in the disc space. Diffuse endplate spurring and bilateral facet degeneration. Severe left foraminal encroachment is unchanged. Mild spinal stenosis.  L2-3: Pedicle screw and interbody fusion. Diffuse vertebral endplate spurring and bilateral facet degeneration. Moderate to severe left foraminal encroachment due to spurring, with slight improvement. Mild spinal stenosis.  L3-4: Pedicle screw and interbody fusion. Diffuse endplate spurring and bilateral facet hypertrophy. Severe left foraminal stenosis with slight improvement. Moderate right foraminal stenosis.  L4-5: Pedicle screw and interbody fusion. Right laminectomy. Moderate right foraminal stenosis due to spurring, with interval improvement. Spinal canal adequate in size  L5-S1: Pedicle screw and interbody fusion. Mild foraminal narrowing bilaterally due to spurring.  IMPRESSION: 1. Interval pedicle screw and interbody fusion L2 through S1. There is loosening of the L2 screws bilaterally. 2. Progressive disc degeneration at L1-2 with severe left foraminal encroachment unchanged. 3. Moderate to severe left foraminal encroachment L2-3 with mild improvement 4. Severe left foraminal encroachment L3-4 with mild improvement. 5. Moderate right foraminal encroachment due to spurring at L4-5 with mild improvement.   Electronically Signed By: Franchot Gallo M.D. On: 01/27/2020 10:41  Results for orders placed during the hospital encounter of 08/08/17 CT LUMBAR SPINE WO CONTRAST  Narrative CLINICAL DATA:  Generalized back pain. Right leg numbness.  Bilateral buttock pain.  EXAM: CT LUMBAR SPINE WITHOUT CONTRAST  TECHNIQUE: Multidetector CT imaging of the lumbar spine was performed without intravenous contrast administration. Multiplanar CT image reconstructions were also generated.  COMPARISON:  MRI 01/10/2017.  FINDINGS: Segmentation: 5 lumbar type vertebral bodies.  Alignment: Thoracolumbar curvature convex to the right and lower lumbar curvature  convex to the left.  Vertebrae: No fracture or primary bone lesion.  Paraspinal and other soft tissues: No significant paravertebral finding.  Disc levels: T11-12: Degenerative desiccation of the disc with vacuum phenomenon. No herniation. No compressive stenosis. Facet osteoarthritis worse on the right.  T12-L1: Mild bulging of the disc. Bilateral facet osteoarthritis. No compressive stenosis.  L1-2: Vacuum phenomenon. Bulging of the disc. Facet degeneration worse on the left. Mild left foraminal stenosis. No compressive central canal narrowing.  L2-3: Endplate osteophytes and bulging of the disc. Facet and ligamentous hypertrophy. Stenosis of the lateral recesses and foramina left more than right. Neural compression could occur on the left at this level.  L3-4: Circumferential bulging of the disc. Facet and ligamentous hypertrophy. Severe multifactorial stenosis at this level that could cause neural compression on either or both sides. Bilateral foraminal stenosis could compress the exiting L3 nerves.  Bilateral sacroiliac arthropathy including large bridging osteophytes on the right.  IMPRESSION: I do not appreciate any change when compared to the MRI study of December. Mild thoracolumbar curvature. Multilevel degenerative disc disease and degenerative facet disease with most important findings as follows.  L2-3: Left lateral recess and foraminal stenosis due to bulging disc and osteophytes.  L3-4: Multifactorial spinal stenosis and bilateral neural  foraminal stenosis likely to cause neural compression.  L4-5: Bulging disc, right posterolateral disc herniation and pronounced facet arthropathy right worse than left. Stenosis of the right lateral recess and intervertebral foramen on the right quite likely to cause neural compression.  L5-S1: Endplate osteophytes, bulging of the disc and bilateral facet arthropathy bilateral lateral recess and neural foraminal stenosis that could cause neural compression on either or both sides.   Electronically Signed By: Nelson Chimes M.D. On: 08/08/2017 13:07  Lumbar DG Bending views: Results for orders placed during the hospital encounter of 03/16/20 DG Lumbar Spine Complete W/Bend  Narrative CLINICAL DATA:  Lower back pain, unable to stand by report, lateral images limited due to positioning in this 65 year old male  EXAM: [place]  COMPARISON:  CT of the lumbar spine from January 27, 2020  FINDINGS: Signs of lumbosacral spinal fusion with sacroiliac screws at the lower end of the construct extending from the L2 through the S1/sacroiliac levels with anterior interbody cage devices at L2-3, L3-4, L4-5 and L5-S1.  Loss of height L1 with similar appearance. Grade 1 anterolisthesis of L1 on L2. Lucency about L2 pedicle screws with similar appearance accounting for differences in technique, better seen on recent CT.  Multilevel osteophytes also similar to recent imaging. Perhaps mild motion during flexion approximately 2 mm anterior translation of L1 on L2 with 9 mm as compared to 7 mm anterolisthesis. No significant change from neutral to extension at this level.  IMPRESSION: 1. Probable mild anterior translation of L1 on L2 with baseline anterolisthesis of L1 on L2 approximately 7 mm, increasing to 9-10 mm with flexion. 2. Lucency about the L2 pedicle screws bilaterally similar compared to recent CT, suggests underlying loosening as on the recent  CT.   Electronically Signed By: Zetta Bills M.D. On: 03/16/2020 16:11        Complexity Note: Imaging results reviewed. Results shared with Mr. Otero, using Layman's terms.                        Meds   Current Outpatient Medications:  .  acetaminophen (TYLENOL) 500 MG tablet, Take 500 mg by mouth every 6 (six)  hours as needed. , Disp: , Rfl:  .  amLODipine (NORVASC) 5 MG tablet, Take 1 tablet (5 mg total) by mouth daily., Disp: 30 tablet, Rfl: 0 .  aspirin 81 MG EC tablet, Take 81 mg by mouth daily., Disp: , Rfl:  .  atorvastatin (LIPITOR) 40 MG tablet, Take 1 tablet (40 mg total) by mouth daily at 6 PM., Disp: 30 tablet, Rfl: 0 .  celecoxib (CELEBREX) 200 MG capsule, Take 200 mg by mouth daily., Disp: , Rfl:  .  Cholecalciferol 25 MCG (1000 UT) tablet, Take 5,000 Units by mouth daily. , Disp: , Rfl:  .  clopidogrel (PLAVIX) 75 MG tablet, Take by mouth., Disp: , Rfl:  .  lisinopril (ZESTRIL) 40 MG tablet, Take 40 mg by mouth at bedtime. , Disp: , Rfl:  .  metFORMIN (GLUCOPHAGE-XR) 500 MG 24 hr tablet, Take 500 mg by mouth 2 (two) times daily., Disp: , Rfl:  .  PARoxetine (PAXIL) 40 MG tablet, Take 40 mg by mouth at bedtime. , Disp: , Rfl:  .  Potassium 99 MG TABS, Take by mouth daily., Disp: , Rfl:  .  sildenafil (REVATIO) 20 MG tablet, Take 20 mg by mouth daily as needed., Disp: , Rfl:  .  tamsulosin (FLOMAX) 0.4 MG CAPS capsule, Take 0.8 mg by mouth at bedtime. , Disp: , Rfl:   ROS  Constitutional: Denies any fever or chills Gastrointestinal: No reported hemesis, hematochezia, vomiting, or acute GI distress Musculoskeletal: Denies any acute onset joint swelling, redness, loss of ROM, or weakness Neurological: No reported episodes of acute onset apraxia, aphasia, dysarthria, agnosia, amnesia, paralysis, loss of coordination, or loss of consciousness  Allergies  Mr. Gadbois has No Known Allergies.  PFSH  Drug: Mr. Lundberg  reports no history of drug use. Alcohol:  reports current  alcohol use. Tobacco:  reports that he has never smoked. He has never used smokeless tobacco. Medical:  has a past medical history of Anxiety, Dyslipidemia, Enlarged prostate, Hypertension, Prediabetes, and TIA (transient ischemic attack). Surgical: Mr. Teuscher  has a past surgical history that includes Back surgery and Hernia repair. Family: family history is not on file.  Constitutional Exam  General appearance: Well nourished, well developed, and well hydrated. In no apparent acute distress Vitals:   04/22/20 1122  BP: (!) 150/90  Pulse: 73  Resp: 16  Temp: (!) 97 F (36.1 C)  TempSrc: Temporal  SpO2: 96%  Weight: 250 lb (113.4 kg)  Height: 6' (1.829 m)   BMI Assessment: Estimated body mass index is 33.91 kg/m as calculated from the following:   Height as of this encounter: 6' (1.829 m).   Weight as of this encounter: 250 lb (113.4 kg).  BMI interpretation table: BMI level Category Range association with higher incidence of chronic pain  <18 kg/m2 Underweight   18.5-24.9 kg/m2 Ideal body weight   25-29.9 kg/m2 Overweight Increased incidence by 20%  30-34.9 kg/m2 Obese (Class I) Increased incidence by 68%  35-39.9 kg/m2 Severe obesity (Class II) Increased incidence by 136%  >40 kg/m2 Extreme obesity (Class III) Increased incidence by 254%   Patient's current BMI Ideal Body weight  Body mass index is 33.91 kg/m. Ideal body weight: 77.6 kg (171 lb 1.2 oz) Adjusted ideal body weight: 91.9 kg (202 lb 10.3 oz)   BMI Readings from Last 4 Encounters:  04/22/20 33.91 kg/m  03/16/20 36.92 kg/m  09/28/19 37.87 kg/m  04/19/19 37.87 kg/m   Wt Readings from Last 4 Encounters:  04/22/20 250  lb (113.4 kg)  03/16/20 250 lb (113.4 kg)  04/19/19 271 lb 8 oz (123.2 kg)  04/04/19 273 lb 8 oz (124.1 kg)    Psych/Mental status: Alert, oriented x 3 (person, place, & time)       Eyes: PERLA Respiratory: No evidence of acute respiratory distress  Assessment & Plan  Primary  Diagnosis & Pertinent Problem List: The primary encounter diagnosis was Chronic pain syndrome. 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  Visit Diagnosis: 1. Chronic pain syndrome   2. Lumbar Grade 1 Anterolisthesis of L1 over L2 (2 mm anterior translation with flexion)   3. Lumbar Grade 1 Retrolisthesis of L2/L3 and L3/L4   4. Lumbosacral facet hypertrophy (Multilevel) (Bilateral)   5. Osteoarthritis of facet joint of lumbar spine   6. Lumbar facet joint syndrome (Bilateral)   7. Dextroscoliosis of lumbar spine   8. Lumbar lateral recess stenosis (Multilevel) (Bilateral)   9. Lumbosacral foraminal stenosis (Multilevel) (Bilateral)   10. 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 Failed back surgical syndrome   12. 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 Epidural lipomatosis   14. Chronic anticoagulation (PLAVIX)    Problems updated and reviewed during this visit: Problem  Lumbar Grade 1 Anterolisthesis of L1 over L2 (2 mm anterior translation with flexion)   Grade 1 anterolisthesis of L1 on L2 with mild motion during flexion approximately 2 mm anterior translation of L1 on L2 during lumbar flexion with 9 mm as compared to 7 mm anterolisthesis. 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    Failed Back Surgical Syndrome   Lumbosacral spinal fusion with sacroiliac screws at the lower end of the construct extending from the L2 through the S1/sacroiliac levels with anterior interbody cage devices at L2-3, L3-4, L4-5 and L5-S1. Lucency about the L2 pedicle screws bilaterally, suggests underlying loosening.     Plan of Care  Pharmacotherapy (Medications Ordered): No orders of the defined types were placed in this encounter.   Procedure Orders     LUMBAR FACET(MEDIAL BRANCH NERVE BLOCK) MBNB Lab Orders  No laboratory test(s) ordered today   Imaging Orders  No imaging studies ordered today   Referral Orders  No referral(s) requested today    Pharmacological management options:  Opioid Analgesics: I will not be prescribing any opioids at this time Membrane stabilizer: Options discussed, including a trial. Muscle relaxant: We have discussed the possibility of a trial NSAID: Trial discussed. Other analgesic(s): To be determined at a later time     Interventional Therapies  Risk  Complexity Considerations:   NOTE: Plavix Anticoagulation (Stop: 7 days  Restart: 2 hours)      Planned  Pending:  Diagnostic bilateral L1-2 Lumbar facet MBB (T12, L1, and L2 medial branch nerve) #1    Under consideration:   Diagnostic bilateral L1-2 Lumbar facet MBB (T12, L1, and L2 medial branch nerve) #1    Completed:   None at this time   Therapeutic  Palliative (PRN) options:   None established    Provider-requested follow-up: Return for Procedure (w/ sedation): (B) L1-2 L-FCT BLK #1 (T12, L1, & L2 MBB), (Blood Thinner Protocol). Recent Visits Date Type Provider Dept  03/16/20 Office  Visit Milinda Pointer, MD [facility]  Showing recent visits within past 90 days and meeting all other requirements Today's Visits Date Type Provider Dept  04/22/20 Office Visit Milinda Pointer, MD [facility]  Showing today's visits and meeting all other requirements Future Appointments No visits were found meeting these conditions. Showing future appointments within next 90 days and meeting all other requirements  Primary Care Physician: [name], NP Note by: Gaspar Cola, MD Date: 04/22/2020; Time: 12:40 PM

## 2020-04-22 ENCOUNTER — Ambulatory Visit: Payer: Medicare HMO | Attending: Pain Medicine | Admitting: Pain Medicine

## 2020-04-22 ENCOUNTER — Encounter: Payer: Self-pay | Admitting: Pain Medicine

## 2020-04-22 ENCOUNTER — Ambulatory Visit: Payer: Medicare HMO | Admitting: Pain Medicine

## 2020-04-22 ENCOUNTER — Other Ambulatory Visit: Payer: Self-pay

## 2020-04-22 VITALS — BP 150/90 | HR 73 | Temp 97.0°F | Resp 16 | Ht 72.0 in | Wt 250.0 lb

## 2020-04-22 DIAGNOSIS — D1779 Benign lipomatous neoplasm of other sites: Secondary | ICD-10-CM | POA: Diagnosis present

## 2020-04-22 DIAGNOSIS — G894 Chronic pain syndrome: Secondary | ICD-10-CM | POA: Insufficient documentation

## 2020-04-22 DIAGNOSIS — M4186 Other forms of scoliosis, lumbar region: Secondary | ICD-10-CM | POA: Insufficient documentation

## 2020-04-22 DIAGNOSIS — M961 Postlaminectomy syndrome, not elsewhere classified: Secondary | ICD-10-CM | POA: Diagnosis present

## 2020-04-22 DIAGNOSIS — M47816 Spondylosis without myelopathy or radiculopathy, lumbar region: Secondary | ICD-10-CM | POA: Diagnosis present

## 2020-04-22 DIAGNOSIS — M4310 Spondylolisthesis, site unspecified: Secondary | ICD-10-CM | POA: Insufficient documentation

## 2020-04-22 DIAGNOSIS — Z7901 Long term (current) use of anticoagulants: Secondary | ICD-10-CM | POA: Insufficient documentation

## 2020-04-22 DIAGNOSIS — M4327 Fusion of spine, lumbosacral region: Secondary | ICD-10-CM | POA: Diagnosis present

## 2020-04-22 DIAGNOSIS — M48062 Spinal stenosis, lumbar region with neurogenic claudication: Secondary | ICD-10-CM | POA: Diagnosis present

## 2020-04-22 DIAGNOSIS — M4807 Spinal stenosis, lumbosacral region: Secondary | ICD-10-CM | POA: Insufficient documentation

## 2020-04-22 DIAGNOSIS — M47817 Spondylosis without myelopathy or radiculopathy, lumbosacral region: Secondary | ICD-10-CM | POA: Insufficient documentation

## 2020-04-22 DIAGNOSIS — M48061 Spinal stenosis, lumbar region without neurogenic claudication: Secondary | ICD-10-CM | POA: Insufficient documentation

## 2020-04-22 DIAGNOSIS — M4316 Spondylolisthesis, lumbar region: Secondary | ICD-10-CM | POA: Diagnosis not present

## 2020-04-22 NOTE — Patient Instructions (Addendum)
____________________________________________________________________________________________  Preparing for Procedure with Sedation  Procedure appointments are limited to planned procedures: . No Prescription Refills. . No disability issues will be discussed. . No medication changes will be discussed.  Instructions: . Oral Intake: Do not eat or drink anything for at least 8 hours prior to your procedure. (Exception: Blood Pressure Medication. See below.) . Transportation: Unless otherwise stated by your physician, you may drive yourself after the procedure. . Blood Pressure Medicine: Do not forget to take your blood pressure medicine with a sip of water the morning of the procedure. If your Diastolic (lower reading)is above 100 mmHg, elective cases will be cancelled/rescheduled. . Blood thinners: These will need to be stopped for procedures. Notify our staff if you are taking any blood thinners. Depending on which one you take, there will be specific instructions on how and when to stop it. . Diabetics on insulin: Notify the staff so that you can be scheduled 1st case in the morning. If your diabetes requires high dose insulin, take only  of your normal insulin dose the morning of the procedure and notify the staff that you have done so. . Preventing infections: Shower with an antibacterial soap the morning of your procedure. . Build-up your immune system: Take 1000 mg of Vitamin C with every meal (3 times a day) the day prior to your procedure. . Antibiotics: Inform the staff if you have a condition or reason that requires you to take antibiotics before dental procedures. . Pregnancy: If you are pregnant, call and cancel the procedure. . Sickness: If you have a cold, fever, or any active infections, call and cancel the procedure. . Arrival: You must be in the facility at least 30 minutes prior to your scheduled procedure. . Children: Do not bring children with you. . Dress appropriately:  Bring dark clothing that you would not mind if they get stained. . Valuables: Do not bring any jewelry or valuables.  Reasons to call and reschedule or cancel your procedure: (Following these recommendations will minimize the risk of a serious complication.) . Surgeries: Avoid having procedures within 2 weeks of any surgery. (Avoid for 2 weeks before or after any surgery). . Flu Shots: Avoid having procedures within 2 weeks of a flu shots or . (Avoid for 2 weeks before or after immunizations). . Barium: Avoid having a procedure within 7-10 days after having had a radiological study involving the use of radiological contrast. (Myelograms, Barium swallow or enema study). . Heart attacks: Avoid any elective procedures or surgeries for the initial 6 months after a "Myocardial Infarction" (Heart Attack). . Blood thinners: It is imperative that you stop these medications before procedures. Let us know if you if you take any blood thinner.  . Infection: Avoid procedures during or within two weeks of an infection (including chest colds or gastrointestinal problems). Symptoms associated with infections include: Localized redness, fever, chills, night sweats or profuse sweating, burning sensation when voiding, cough, congestion, stuffiness, runny nose, sore throat, diarrhea, nausea, vomiting, cold or Flu symptoms, recent or current infections. It is specially important if the infection is over the area that we intend to treat. . Heart and lung problems: Symptoms that may suggest an active cardiopulmonary problem include: cough, chest pain, breathing difficulties or shortness of breath, dizziness, ankle swelling, uncontrolled high or unusually low blood pressure, and/or palpitations. If you are experiencing any of these symptoms, cancel your procedure and contact your primary care physician for an evaluation.  Remember:  Regular Business hours are:    Monday to Thursday 8:00 AM to 4:00 PM  Provider's  Schedule: Milinda Pointer, MD:  Procedure days: Tuesday and Thursday 7:30 AM to 4:00 PM  Gillis Santa, MD:  Procedure days: Monday and Wednesday 7:30 AM to 4:00 PM ____________________________________________________________________________________________   ____________________________________________________________________________________________  Blood Thinners  IMPORTANT NOTICE:  If you take any of these, make sure to notify the nursing staff.  Failure to do so may result in injury.  Recommended time intervals to stop and restart blood-thinners, before & after invasive procedures  Generic Name Brand Name Stop Time. Must be stopped at least this long before procedures. After procedures, wait at least this long before re-starting.  Abciximab Reopro 15 days 2 hrs  Alteplase Activase 10 days 10 days  Anagrelide Agrylin    Apixaban Eliquis 3 days 6 hrs  Cilostazol Pletal 3 days 5 hrs  Clopidogrel Plavix 7-10 days 2 hrs  Dabigatran Pradaxa 5 days 6 hrs  Dalteparin Fragmin 24 hours 4 hrs  Dipyridamole Aggrenox 11days 2 hrs  Edoxaban Lixiana; Savaysa 3 days 2 hrs  Enoxaparin  Lovenox 24 hours 4 hrs  Eptifibatide Integrillin 8 hours 2 hrs  Fondaparinux  Arixtra 72 hours 12 hrs  Hydroxychloroquine Plaquenil 11 days   Prasugrel Effient 7-10 days 6 hrs  Reteplase Retavase 10 days 10 days  Rivaroxaban Xarelto 3 days 6 hrs  Ticagrelor Brilinta 5-7 days 6 hrs  Ticlopidine Ticlid 10-14 days 2 hrs  Tinzaparin Innohep 24 hours 4 hrs  Tirofiban Aggrastat 8 hours 2 hrs  Warfarin Coumadin 5 days 2 hrs   Other medications with blood-thinning effects  Product indications Generic (Brand) names Note  Cholesterol Lipitor Stop 4 days before procedure  Blood thinner (injectable) Heparin (LMW or LMWH Heparin) Stop 24 hours before procedure  Cancer Ibrutinib (Imbruvica) Stop 7 days before procedure  Malaria/Rheumatoid Hydroxychloroquine (Plaquenil) Stop 11 days before procedure   Thrombolytics  10 days before or after procedures   Over-the-counter (OTC) Products with blood-thinning effects  Product Common names Stop Time  Aspirin > 325 mg Goody Powders, Excedrin, etc. 11 days  Aspirin ? 81 mg  7 days  Fish oil  4 days  Garlic supplements  7 days  Ginkgo biloba  36 hours  Ginseng  24 hours  NSAIDs Ibuprofen, Naprosyn, etc. 3 days  Vitamin E  4 days   ____________________________________________________________________________________________  ____________________________________________________________________________________________  General Risks and Possible Complications  Patient Responsibilities: It is important that you read this as it is part of your informed consent. It is our duty to inform you of the risks and possible complications associated with treatments offered to you. It is your responsibility as a patient to read this and to ask questions about anything that is not clear or that you believe was not covered in this document.  Patient's Rights: You have the right to refuse treatment. You also have the right to change your mind, even after initially having agreed to have the treatment done. However, under this last option, if you wait until the last second to change your mind, you may be charged for the materials used up to that point.  Introduction: Medicine is not an Chief Strategy Officer. Everything in Medicine, including the lack of treatment(s), carries the potential for danger, harm, or loss (which is by definition: Risk). In Medicine, a complication is a secondary problem, condition, or disease that can aggravate an already existing one. All treatments carry the risk of possible complications. The fact that a side effects or complications occurs, does not  imply that the treatment was conducted incorrectly. It must be clearly understood that these can happen even when everything is done following the highest safety standards.  No treatment: You can  choose not to proceed with the proposed treatment alternative. The "PRO(s)" would include: avoiding the risk of complications associated with the therapy. The "CON(s)" would include: not getting any of the treatment benefits. These benefits fall under one of three categories: diagnostic; therapeutic; and/or palliative. Diagnostic benefits include: getting information which can ultimately lead to improvement of the disease or symptom(s). Therapeutic benefits are those associated with the successful treatment of the disease. Finally, palliative benefits are those related to the decrease of the primary symptoms, without necessarily curing the condition (example: decreasing the pain from a flare-up of a chronic condition, such as incurable terminal cancer).  General Risks and Complications: These are associated to most interventional treatments. They can occur alone, or in combination. They fall under one of the following six (6) categories: no benefit or worsening of symptoms; bleeding; infection; nerve damage; allergic reactions; and/or death. 1. No benefits or worsening of symptoms: In Medicine there are no guarantees, only probabilities. No healthcare provider can ever guarantee that a medical treatment will work, they can only state the probability that it may. Furthermore, there is always the possibility that the condition may worsen, either directly, or indirectly, as a consequence of the treatment. 2. Bleeding: This is more common if the patient is taking a blood thinner, either prescription or over the counter (example: Goody Powders, Fish oil, Aspirin, Garlic, etc.), or if suffering a condition associated with impaired coagulation (example: Hemophilia, cirrhosis of the liver, low platelet counts, etc.). However, even if you do not have one on these, it can still happen. If you have any of these conditions, or take one of these drugs, make sure to notify your treating physician. 3. Infection: This is more  common in patients with a compromised immune system, either due to disease (example: diabetes, cancer, human immunodeficiency virus [HIV], etc.), or due to medications or treatments (example: therapies used to treat cancer and rheumatological diseases). However, even if you do not have one on these, it can still happen. If you have any of these conditions, or take one of these drugs, make sure to notify your treating physician. 4. Nerve Damage: This is more common when the treatment is an invasive one, but it can also happen with the use of medications, such as those used in the treatment of cancer. The damage can occur to small secondary nerves, or to large primary ones, such as those in the spinal cord and brain. This damage may be temporary or permanent and it may lead to impairments that can range from temporary numbness to permanent paralysis and/or brain death. 5. Allergic Reactions: Any time a substance or material comes in contact with our body, there is the possibility of an allergic reaction. These can range from a mild skin rash (contact dermatitis) to a severe systemic reaction (anaphylactic reaction), which can result in death. 6. Death: In general, any medical intervention can result in death, most of the time due to an unforeseen complication. ____________________________________________________________________________________________  GENERAL RISKS AND COMPLICATIONS  What are the risk, side effects and possible complications? Generally speaking, most procedures are safe.  However, with any procedure there are risks, side effects, and the possibility of complications.  The risks and complications are dependent upon the sites that are lesioned, or the type of nerve block to be performed.  The closer the procedure is to the spine, the more serious the risks are.  Great care is taken when placing the radio frequency needles, block needles or lesioning probes, but sometimes complications can  occur. 1. Infection: Any time there is an injection through the skin, there is a risk of infection.  This is why sterile conditions are used for these blocks.  There are four possible types of infection. 1. Localized skin infection. 2. Central Nervous System Infection-This can be in the form of Meningitis, which can be deadly. 3. Epidural Infections-This can be in the form of an epidural abscess, which can cause pressure inside of the spine, causing compression of the spinal cord with subsequent paralysis. This would require an emergency surgery to decompress, and there are no guarantees that the patient would recover from the paralysis. 4. Discitis-This is an infection of the intervertebral discs.  It occurs in about 1% of discography procedures.  It is difficult to treat and it may lead to surgery.        2. Pain: the needles have to go through skin and soft tissues, will cause soreness.       3. Damage to internal structures:  The nerves to be lesioned may be near blood vessels or    other nerves which can be potentially damaged.       4. Bleeding: Bleeding is more common if the patient is taking blood thinners such as  aspirin, Coumadin, Ticiid, Plavix, etc., or if he/she have some genetic predisposition  such as hemophilia. Bleeding into the spinal canal can cause compression of the spinal  cord with subsequent paralysis.  This would require an emergency surgery to  decompress and there are no guarantees that the patient would recover from the  paralysis.       5. Pneumothorax:  Puncturing of a lung is a possibility, every time a needle is introduced in  the area of the chest or upper back.  Pneumothorax refers to free air around the  collapsed lung(s), inside of the thoracic cavity (chest cavity).  Another two possible  complications related to a similar event would include: Hemothorax and Chylothorax.   These are variations of the Pneumothorax, where instead of air around the collapsed  lung(s),  you may have blood or chyle, respectively.       6. Spinal headaches: They may occur with any procedures in the area of the spine.       7. Persistent CSF (Cerebro-Spinal Fluid) leakage: This is a rare problem, but may occur  with prolonged intrathecal or epidural catheters either due to the formation of a fistulous  track or a dural tear.       8. Nerve damage: By working so close to the spinal cord, there is always a possibility of  nerve damage, which could be as serious as a permanent spinal cord injury with  paralysis.       9. Death:  Although rare, severe deadly allergic reactions known as "Anaphylactic  reaction" can occur to any of the medications used.      10. Worsening of the symptoms:  We can always make thing worse.  What are the chances of something like this happening? Chances of any of this occuring are extremely low.  By statistics, you have more of a chance of getting killed in a motor vehicle accident: while driving to the hospital than any of the above occurring .  Nevertheless, you should be aware that they are  possibilities.  In general, it is similar to taking a shower.  Everybody knows that you can slip, hit your head and get killed.  Does that mean that you should not shower again?  Nevertheless always keep in mind that statistics do not mean anything if you happen to be on the wrong side of them.  Even if a procedure has a 1 (one) in a 1,000,000 (million) chance of going wrong, it you happen to be that one..Also, keep in mind that by statistics, you have more of a chance of having something go wrong when taking medications.  Who should not have this procedure? If you are on a blood thinning medication (e.g. Coumadin, Plavix, see list of "Blood Thinners"), or if you have an active infection going on, you should not have the procedure.  If you are taking any blood thinners, please inform your physician.  How should I prepare for this procedure?  Do not eat or drink anything at  least six hours prior to the procedure.  Bring a driver with you .  It cannot be a taxi.  Come accompanied by an adult that can drive you back, and that is strong enough to help you if your legs get weak or numb from the local anesthetic.  Take all of your medicines the morning of the procedure with just enough water to swallow them.  If you have diabetes, make sure that you are scheduled to have your procedure done first thing in the morning, whenever possible.  If you have diabetes, take only half of your insulin dose and notify our nurse that you have done so as soon as you arrive at the clinic.  If you are diabetic, but only take blood sugar pills (oral hypoglycemic), then do not take them on the morning of your procedure.  You may take them after you have had the procedure.  Do not take aspirin or any aspirin-containing medications, at least eleven (11) days prior to the procedure.  They may prolong bleeding.  Wear loose fitting clothing that may be easy to take off and that you would not mind if it got stained with Betadine or blood.  Do not wear any jewelry or perfume  Remove any nail coloring.  It will interfere with some of our monitoring equipment.  NOTE: Remember that this is not meant to be interpreted as a complete list of all possible complications.  Unforeseen problems may occur.  BLOOD THINNERS The following drugs contain aspirin or other products, which can cause increased bleeding during surgery and should not be taken for 2 weeks prior to and 1 week after surgery.  If you should need take something for relief of minor pain, you may take acetaminophen which is found in Tylenol,m Datril, Anacin-3 and Panadol. It is not blood thinner. The products listed below are.  Do not take any of the products listed below in addition to any listed on your instruction sheet.  A.P.C or A.P.C with Codeine Codeine Phosphate Capsules #3 Ibuprofen Ridaura  ABC compound Congesprin Imuran  rimadil  Advil Cope Indocin Robaxisal  Alka-Seltzer Effervescent Pain Reliever and Antacid Coricidin or Coricidin-D  Indomethacin Rufen  Alka-Seltzer plus Cold Medicine Cosprin Ketoprofen S-A-C Tablets  Anacin Analgesic Tablets or Capsules Coumadin Korlgesic Salflex  Anacin Extra Strength Analgesic tablets or capsules CP-2 Tablets Lanoril Salicylate  Anaprox Cuprimine Capsules Levenox Salocol  Anexsia-D Dalteparin Magan Salsalate  Anodynos Darvon compound Magnesium Salicylate Sine-off  Ansaid Dasin Capsules Magsal Sodium Salicylate  Anturane  Depen Capsules Marnal Soma  APF Arthritis pain formula Dewitt's Pills Measurin Stanback  Argesic Dia-Gesic Meclofenamic Sulfinpyrazone  Arthritis Bayer Timed Release Aspirin Diclofenac Meclomen Sulindac  Arthritis pain formula Anacin Dicumarol Medipren Supac  Analgesic (Safety coated) Arthralgen Diffunasal Mefanamic Suprofen  Arthritis Strength Bufferin Dihydrocodeine Mepro Compound Suprol  Arthropan liquid Dopirydamole Methcarbomol with Aspirin Synalgos  ASA tablets/Enseals Disalcid Micrainin Tagament  Ascriptin Doan's Midol Talwin  Ascriptin A/D Dolene Mobidin Tanderil  Ascriptin Extra Strength Dolobid Moblgesic Ticlid  Ascriptin with Codeine Doloprin or Doloprin with Codeine Momentum Tolectin  Asperbuf Duoprin Mono-gesic Trendar  Aspergum Duradyne Motrin or Motrin IB Triminicin  Aspirin plain, buffered or enteric coated Durasal Myochrisine Trigesic  Aspirin Suppositories Easprin Nalfon Trillsate  Aspirin with Codeine Ecotrin Regular or Extra Strength Naprosyn Uracel  Atromid-S Efficin Naproxen Ursinus  Auranofin Capsules Elmiron Neocylate Vanquish  Axotal Emagrin Norgesic Verin  Azathioprine Empirin or Empirin with Codeine Normiflo Vitamin E  Azolid Emprazil Nuprin Voltaren  Bayer Aspirin plain, buffered or children's or timed BC Tablets or powders Encaprin Orgaran Warfarin Sodium  Buff-a-Comp Enoxaparin Orudis Zorpin  Buff-a-Comp with  Codeine Equegesic Os-Cal-Gesic   Buffaprin Excedrin plain, buffered or Extra Strength Oxalid   Bufferin Arthritis Strength Feldene Oxphenbutazone   Bufferin plain or Extra Strength Feldene Capsules Oxycodone with Aspirin   Bufferin with Codeine Fenoprofen Fenoprofen Pabalate or Pabalate-SF   Buffets II Flogesic Panagesic   Buffinol plain or Extra Strength Florinal or Florinal with Codeine Panwarfarin   Buf-Tabs Flurbiprofen Penicillamine   Butalbital Compound Four-way cold tablets Penicillin   Butazolidin Fragmin Pepto-Bismol   Carbenicillin Geminisyn Percodan   Carna Arthritis Reliever Geopen Persantine   Carprofen Gold's salt Persistin   Chloramphenicol Goody's Phenylbutazone   Chloromycetin Haltrain Piroxlcam   Clmetidine heparin Plaquenil   Cllnoril Hyco-pap Ponstel   Clofibrate Hydroxy chloroquine Propoxyphen         Before stopping any of these medications, be sure to consult the physician who ordered them.  Some, such as Coumadin (Warfarin) are ordered to prevent or treat serious conditions such as "deep thrombosis", "pumonary embolisms", and other heart problems.  The amount of time that you may need off of the medication may also vary with the medication and the reason for which you were taking it.  If you are taking any of these medications, please make sure you notify your pain physician before you undergo any procedures.         Pain Management Discharge Instructions  General Discharge Instructions :  If you need to reach your doctor call: Monday-Friday 8:00 am - 4:00 pm at [phone] or toll free [phone].  After clinic hours [phone] to have operator reach doctor.  Bring all of your medication bottles to all your appointments in the pain clinic.  To cancel or reschedule your appointment with Pain Management please remember to call 24 hours in advance to avoid a fee.  Refer to the educational materials which you have been given on: General Risks, I had  my Procedure. Discharge Instructions, Post Sedation.  Post Procedure Instructions:  The drugs you were given will stay in your system until tomorrow, so for the next 24 hours you should not drive, make any legal decisions or drink any alcoholic beverages.  You may eat anything you prefer, but it is better to start with liquids then soups and crackers, and gradually work up to solid foods.  Please notify your doctor immediately if you have any unusual bleeding, trouble breathing or pain that is not  related to your normal pain.  Depending on the type of procedure that was done, some parts of your body may feel week and/or numb.  This usually clears up by tonight or the next day.  Walk with the use of an assistive device or accompanied by an adult for the 24 hours.  You may use ice on the affected area for the first 24 hours.  Put ice in a Ziploc bag and cover with a towel and place against area 15 minutes on 15 minutes off.  You may switch to heat after 24 hours.Facet Blocks Patient Information  Description: The facets are joints in the spine between the vertebrae.  Like any joints in the body, facets can become irritated and painful.  Arthritis can also effect the facets.  By injecting steroids and local anesthetic in and around these joints, we can temporarily block the nerve supply to them.  Steroids act directly on irritated nerves and tissues to reduce selling and inflammation which often leads to decreased pain.  Facet blocks may be done anywhere along the spine from the neck to the low back depending upon the location of your pain.   After numbing the skin with local anesthetic (like Novocaine), a small needle is passed onto the facet joints under x-ray guidance.  You may experience a sensation of pressure while this is being done.  The entire block usually lasts about 15-25 minutes.   Conditions which may be treated by facet blocks:   Low back/buttock pain  Neck/shoulder  pain  Certain types of headaches  Preparation for the injection:  1. Do not eat any solid food or dairy products within 8 hours of your appointment. 2. You may drink clear liquid up to 3 hours before appointment.  Clear liquids include water, black coffee, juice or soda.  No milk or cream please. 3. You may take your regular medication, including pain medications, with a sip of water before your appointment.  Diabetics should hold regular insulin (if taken separately) and take 1/2 normal NPH dose the morning of the procedure.  Carry some sugar containing items with you to your appointment. 4. A driver must accompany you and be prepared to drive you home after your procedure. 5. Bring all your current medications with you. 6. An IV may be inserted and sedation may be given at the discretion of the physician. 7. A blood pressure cuff, EKG and other monitors will often be applied during the procedure.  Some patients may need to have extra oxygen administered for a short period. 8. You will be asked to provide medical information, including your allergies and medications, prior to the procedure.  We must know immediately if you are taking blood thinners (like Coumadin/Warfarin) or if you are allergic to IV iodine contrast (dye).  We must know if you could possible be pregnant.  Possible side-effects:   Bleeding from needle site  Infection (rare, may require surgery)  Nerve injury (rare)  Numbness & tingling (temporary)  Difficulty urinating (rare, temporary)  Spinal headache (a headache worse with upright posture)  Light-headedness (temporary)  Pain at injection site (serveral days)  Decreased blood pressure (rare, temporary)  Weakness in arm/leg (temporary)  Pressure sensation in back/neck (temporary)   Call if you experience:   Fever/chills associated with headache or increased back/neck pain  Headache worsened by an upright position  New onset, weakness or numbness of  an extremity below the injection site  Hives or difficulty breathing (go to the emergency  room)  Inflammation or drainage at the injection site(s)  Severe back/neck pain greater than usual  New symptoms which are concerning to you  Please note:  Although the local anesthetic injected can often make your back or neck feel good for several hours after the injection, the pain will likely return. It takes 3-7 days for steroids to work.  You may not notice any pain relief for at least one week.  If effective, we will often do a series of 2-3 injections spaced 3-6 weeks apart to maximally decrease your pain.  After the initial series, you may be a candidate for a more permanent nerve block of the facets.  If you have any questions, please call #[phone]) [facility]

## 2020-04-22 NOTE — Progress Notes (Signed)
Safety precautions to be maintained throughout the outpatient stay will include: orient to surroundings, keep bed in low position, maintain call bell within reach at all times, provide assistance with transfer out of bed and ambulation.  

## 2020-04-23 ENCOUNTER — Telehealth: Payer: Self-pay

## 2020-04-23 NOTE — Telephone Encounter (Signed)
Clearance to stop Plavix sent 04-22-20, resent 04-23-20

## 2020-04-23 NOTE — Telephone Encounter (Signed)
I have the authorization for his procedure. Please let me know when you get clearance to stop his plavix. Thanks.

## 2020-05-07 ENCOUNTER — Ambulatory Visit: Payer: Medicare HMO | Admitting: Pain Medicine

## 2020-05-14 ENCOUNTER — Ambulatory Visit: Payer: Medicare HMO | Admitting: Pain Medicine

## 2020-05-26 ENCOUNTER — Other Ambulatory Visit: Payer: Self-pay

## 2020-05-26 ENCOUNTER — Ambulatory Visit (HOSPITAL_BASED_OUTPATIENT_CLINIC_OR_DEPARTMENT_OTHER): Payer: Medicare HMO | Admitting: Pain Medicine

## 2020-05-26 ENCOUNTER — Encounter: Payer: Self-pay | Admitting: Pain Medicine

## 2020-05-26 ENCOUNTER — Ambulatory Visit
Admission: RE | Admit: 2020-05-26 | Discharge: 2020-05-26 | Disposition: A | Discharge status: Home / Self Care | Payer: Medicare HMO | Source: Ambulatory Visit | Attending: Pain Medicine | Admitting: Pain Medicine

## 2020-05-26 VITALS — BP 106/61 | HR 75 | Temp 97.3°F | Resp 22 | Ht 71.0 in | Wt 265.0 lb

## 2020-05-26 DIAGNOSIS — M4316 Spondylolisthesis, lumbar region: Secondary | ICD-10-CM

## 2020-05-26 DIAGNOSIS — Z7901 Long term (current) use of anticoagulants: Secondary | ICD-10-CM | POA: Diagnosis present

## 2020-05-26 DIAGNOSIS — M47816 Spondylosis without myelopathy or radiculopathy, lumbar region: Secondary | ICD-10-CM | POA: Insufficient documentation

## 2020-05-26 DIAGNOSIS — M47817 Spondylosis without myelopathy or radiculopathy, lumbosacral region: Secondary | ICD-10-CM | POA: Diagnosis present

## 2020-05-26 MED ORDER — FENTANYL CITRATE (PF) 100 MCG/2ML IJ SOLN
25.0000 ug | INTRAMUSCULAR | Status: DC | PRN
  Administered 2020-05-26: 50 ug via INTRAVENOUS

## 2020-05-26 MED ORDER — LACTATED RINGERS IV SOLN
1000.0000 mL | Freq: Once | INTRAVENOUS | Status: AC
Start: 2020-05-26 — End: 2020-05-26
  Administered 2020-05-26: 1000 mL via INTRAVENOUS

## 2020-05-26 MED ORDER — LIDOCAINE HCL 2 % IJ SOLN
20.0000 mL | Freq: Once | INTRAMUSCULAR | Status: AC
Start: 2020-05-26 — End: 2020-05-26
  Administered 2020-05-26: 400 mg

## 2020-05-26 MED ORDER — TRIAMCINOLONE ACETONIDE 40 MG/ML IJ SUSP
40.0000 mg | Freq: Once | INTRAMUSCULAR | Status: AC
  Administered 2020-05-26: 40 mg

## 2020-05-26 MED ORDER — MIDAZOLAM HCL 5 MG/5ML IJ SOLN
1.0000 mg | INTRAMUSCULAR | Status: DC | PRN
  Administered 2020-05-26: 1 mg via INTRAVENOUS

## 2020-05-26 MED ORDER — ROPIVACAINE HCL 2 MG/ML IJ SOLN
INTRAMUSCULAR | Status: AC
  Filled 2020-05-26: qty 20

## 2020-05-26 MED ORDER — ROPIVACAINE HCL 2 MG/ML IJ SOLN
9.0000 mL | Freq: Once | INTRAMUSCULAR | Status: AC
  Administered 2020-05-26: 9 mL via PERINEURAL

## 2020-05-26 MED ORDER — FENTANYL CITRATE (PF) 100 MCG/2ML IJ SOLN
INTRAMUSCULAR | Status: AC
  Filled 2020-05-26: qty 2

## 2020-05-26 MED ORDER — TRIAMCINOLONE ACETONIDE 40 MG/ML IJ SUSP
INTRAMUSCULAR | Status: AC
  Filled 2020-05-26: qty 2

## 2020-05-26 MED ORDER — MIDAZOLAM HCL 5 MG/5ML IJ SOLN
INTRAMUSCULAR | Status: AC
  Filled 2020-05-26: qty 5

## 2020-05-26 MED ORDER — LIDOCAINE HCL 2 % IJ SOLN
INTRAMUSCULAR | Status: AC
  Filled 2020-05-26: qty 20

## 2020-05-26 NOTE — Progress Notes (Signed)
Safety precautions to be maintained throughout the outpatient stay will include: orient to surroundings, keep bed in low position, maintain call bell within reach at all times, provide assistance with transfer out of bed and ambulation.  

## 2020-05-26 NOTE — Patient Instructions (Signed)

## 2020-05-26 NOTE — Progress Notes (Signed)
PROVIDER NOTE: Information contained herein reflects review and annotations entered in association with encounter. Interpretation of such information and data should be left to medically-trained personnel. Information provided to patient can be located elsewhere in the medical record under "Patient Instructions". Document created using STT-dictation technology, any transcriptional errors that may result from process are unintentional.    Patient: Keith Carter  Service Category: Procedure  Provider: Gaspar Cola, MD  DOB: 1955-08-04  DOS: 05/26/2020  Location: [facility]  MRN: [id number]  Setting: Ambulatory - outpatient  Referring Provider: [name], *  Type: Established Patient  Specialty: Interventional Pain Management  PCP: [name], NP   Primary Reason for Visit: Interventional Pain Management Treatment. CC: Back Pain (lower)  Procedure:          Anesthesia, Analgesia, Anxiolysis:  Type: Lumbar Facet, Medial Branch Block(s) #1  Primary Purpose: Diagnostic Region: Posterolateral Lumbosacral Spine Level: T12, L1, L2 Medial Branch Level(s). Injecting these levels blocks the L1-2 lumbar facet joints. Laterality: Bilateral  Type: Moderate (Conscious) Sedation combined with Local Anesthesia Indication(s): Analgesia and Anxiety Route: Intravenous (IV) IV Access: Secured Sedation: Meaningful verbal contact was maintained at all times during the procedure  Local Anesthetic: Lidocaine 1-2%  Position: Prone   Indications: 1. Lumbar facet joint syndrome (Bilateral)   2. Lumbosacral facet hypertrophy (Multilevel) (Bilateral)   3. Osteoarthritis of facet joint of lumbar spine   4. Lumbar Grade 1 Anterolisthesis of L1 over L2 (2 mm anterior translation with flexion)   5. Chronic anticoagulation (PLAVIX)    Pain Score: Pre-procedure: 7 /10 Post-procedure: 0-No pain/10   Pre-op H&P Assessment:  Keith Carter is a 65 y.o. (year old), male patient,  seen today for interventional treatment. He  has a past surgical history that includes Back surgery and Hernia repair. Keith Carter has a current medication list which includes the following prescription(s): acetaminophen, amlodipine, atorvastatin, celecoxib, cholecalciferol, lisinopril, metformin, paroxetine, potassium, sildenafil, tamsulosin, and clopidogrel, and the following Facility-Administered Medications: fentanyl and midazolam. His primarily concern today is the Back Pain (lower)  Initial Vital Signs:  Pulse/HCG Rate: 86ECG Heart Rate: 87 Temp: (!) 97.3 F (36.3 C) Resp: 16 BP: 126/86 SpO2: 98 %  BMI: Estimated body mass index is 36.96 kg/m as calculated from the following:   Height as of this encounter: 5\' 11"  (1.803 m).   Weight as of this encounter: 265 lb (120.2 kg).  Risk Assessment: Allergies: Reviewed. He has No Known Allergies.  Allergy Precautions: None required Coagulopathies: Reviewed. None identified.  Blood-thinner therapy: None at this time Active Infection(s): Reviewed. None identified. Keith Carter is afebrile  Site Confirmation: Keith Carter was asked to confirm the procedure and laterality before marking the site Procedure checklist: Completed Consent: Before the procedure and under the influence of no sedative(s), amnesic(s), or anxiolytics, the patient was informed of the treatment options, risks and possible complications. To fulfill our ethical and legal obligations, as recommended by the American Medical Association's Code of Ethics, I have informed the patient of my clinical impression; the nature and purpose of the treatment or procedure; the risks, benefits, and possible complications of the intervention; the alternatives, including doing nothing; the risk(s) and benefit(s) of the alternative treatment(s) or procedure(s); and the risk(s) and benefit(s) of doing nothing. The patient was provided information about the general risks and possible complications  associated with the procedure. These may include, but are not limited to: failure to achieve desired goals, infection, bleeding, organ or nerve damage, allergic  reactions, paralysis, and death. In addition, the patient was informed of those risks and complications associated to Spine-related procedures, such as failure to decrease pain; infection (i.e.: Meningitis, epidural or intraspinal abscess); bleeding (i.e.: epidural hematoma, subarachnoid hemorrhage, or any other type of intraspinal or peri-dural bleeding); organ or nerve damage (i.e.: Any type of peripheral nerve, nerve root, or spinal cord injury) with subsequent damage to sensory, motor, and/or autonomic systems, resulting in permanent pain, numbness, and/or weakness of one or several areas of the body; allergic reactions; (i.e.: anaphylactic reaction); and/or death. Furthermore, the patient was informed of those risks and complications associated with the medications. These include, but are not limited to: allergic reactions (i.e.: anaphylactic or anaphylactoid reaction(s)); adrenal axis suppression; blood sugar elevation that in diabetics may result in ketoacidosis or comma; water retention that in patients with history of congestive heart failure may result in shortness of breath, pulmonary edema, and decompensation with resultant heart failure; weight gain; swelling or edema; medication-induced neural toxicity; particulate matter embolism and blood vessel occlusion with resultant organ, and/or nervous system infarction; and/or aseptic necrosis of one or more joints. Finally, the patient was informed that Medicine is not an exact science; therefore, there is also the possibility of unforeseen or unpredictable risks and/or possible complications that may result in a catastrophic outcome. The patient indicated having understood very clearly. We have given the patient no guarantees and we have made no promises. Enough time was given to the patient to  ask questions, all of which were answered to the patient's satisfaction. Keith Carter has indicated that he wanted to continue with the procedure. Attestation: I, the ordering provider, attest that I have discussed with the patient the benefits, risks, side-effects, alternatives, likelihood of achieving goals, and potential problems during recovery for the procedure that I have provided informed consent. Date  Time: 05/26/2020  8:51 AM  Pre-Procedure Preparation:  Monitoring: As per clinic protocol. Respiration, ETCO2, SpO2, BP, heart rate and rhythm monitor placed and checked for adequate function Safety Precautions: Patient was assessed for positional comfort and pressure points before starting the procedure. Time-out: I initiated and conducted the "Time-out" before starting the procedure, as per protocol. The patient was asked to participate by confirming the accuracy of the "Time Out" information. Verification of the correct person, site, and procedure were performed and confirmed by me, the nursing staff, and the patient. "Time-out" conducted as per Joint Commission's Universal Protocol (UP.01.01.01). Time: 0927  Description of Procedure:          Laterality: Bilateral. The procedure was performed in identical fashion on both sides. Levels:  T12, L1, L2 Medial Branch Level(s) Area Prepped: Posterior Lumbosacral Region DuraPrep (Iodine Povacrylex [0.7% available iodine] and Isopropyl Alcohol, 74% w/w) Safety Precautions: Aspiration looking for blood return was conducted prior to all injections. At no point did we inject any substances, as a needle was being advanced. Before injecting, the patient was told to immediately notify me if he was experiencing any new onset of "ringing in the ears, or metallic taste in the mouth". No attempts were made at seeking any paresthesias. Safe injection practices and needle disposal techniques used. Medications properly checked for expiration dates. SDV (single dose  vial) medications used. After the completion of the procedure, all disposable equipment used was discarded in the proper designated medical waste containers. Local Anesthesia: Protocol guidelines were followed. The patient was positioned over the fluoroscopy table. The area was prepped in the usual manner. The time-out was completed. The target area  was identified using fluoroscopy. A 12-in long, straight, sterile hemostat was used with fluoroscopic guidance to locate the targets for each level blocked. Once located, the skin was marked with an approved surgical skin marker. Once all sites were marked, the skin (epidermis, dermis, and hypodermis), as well as deeper tissues (fat, connective tissue and muscle) were infiltrated with a small amount of a short-acting local anesthetic, loaded on a 10cc syringe with a 25G, 1.5-in  Needle. An appropriate amount of time was allowed for local anesthetics to take effect before proceeding to the next step. Local Anesthetic: Lidocaine 2.0% The unused portion of the local anesthetic was discarded in the proper designated containers. Technical explanation of process:  T12 Medial Branch Nerve Block (MBB): The target area for the T12 medial branch is at the junction of the postero-lateral aspect of the superior articular process and the superior, posterior, and medial edge of the transverse process of L1. Under fluoroscopic guidance, a Quincke needle was inserted until contact was made with os over the superior postero-lateral aspect of the pedicular shadow (target area). After negative aspiration for blood, 0.5 mL of the nerve block solution was injected without difficulty or complication. The needle was removed intact. L1 Medial Branch Nerve Block (MBB): The target area for the L1 medial branch is at the junction of the postero-lateral aspect of the superior articular process and the superior, posterior, and medial edge of the transverse process of L2. Under fluoroscopic  guidance, a Quincke needle was inserted until contact was made with os over the superior postero-lateral aspect of the pedicular shadow (target area). After negative aspiration for blood, 0.5 mL of the nerve block solution was injected without difficulty or complication. The needle was removed intact. L2 Medial Branch Nerve Block (MBB): The target area for the L2 medial branch is at the junction of the postero-lateral aspect of the superior articular process and the superior, posterior, and medial edge of the transverse process of L3. Under fluoroscopic guidance, a Quincke needle was inserted until contact was made with os over the superior postero-lateral aspect of the pedicular shadow (target area). After negative aspiration for blood, 0.5 mL of the nerve block solution was injected without difficulty or complication. The needle was removed intact.  Nerve block solution: 0.2% PF-Ropivacaine + Triamcinolone (40 mg/mL) diluted to a final concentration of 4 mg of Triamcinolone/mL of Ropivacaine The unused portion of the solution was discarded in the proper designated containers. Procedural Needles: 22-gauge, 3.5-inch, Quincke needles used for all levels.  Once the entire procedure was completed, the treated area was cleaned, making sure to leave some of the prepping solution back to take advantage of its long term bactericidal properties.      Illustration of the posterior view of the lumbar spine and the posterior neural structures. Laminae of L2 through S1 are labeled. DPRL5, dorsal primary ramus of L5; DPRS1, dorsal primary ramus of S1; DPR3, dorsal primary ramus of L3; FJ, facet (zygapophyseal) joint L3-L4; I, inferior articular process of L4; LB1, lateral branch of dorsal primary ramus of L1; IAB, inferior articular branches from L3 medial branch (supplies L4-L5 facet joint); IBP, intermediate branch plexus; MB3, medial branch of dorsal primary ramus of L3; NR3, third lumbar nerve root; S, superior  articular process of L5; SAB, superior articular branches from L4 (supplies L4-5 facet joint also); TP3, transverse process of L3.  Vitals:   05/26/20 0940 05/26/20 0950 05/26/20 1000 05/26/20 1010  BP: 124/80 133/78 122/80 106/61  Pulse:  76  73 75  Resp: 18 16 17  (!) 22  Temp:      TempSrc:      SpO2: 95% 99% 98% 99%  Weight:      Height:         Start Time: 0927 hrs. End Time: 0937 hrs.  Imaging Guidance (Spinal):          Type of Imaging Technique: Fluoroscopy Guidance (Spinal) Indication(s): Assistance in needle guidance and placement for procedures requiring needle placement in or near specific anatomical locations not easily accessible without such assistance. Exposure Time: Please see nurses notes. Contrast: None used. Fluoroscopic Guidance: I was personally present during the use of fluoroscopy. "Tunnel Vision Technique" used to obtain the best possible view of the target area. Parallax error corrected before commencing the procedure. "Direction-depth-direction" technique used to introduce the needle under continuous pulsed fluoroscopy. Once target was reached, antero-posterior, oblique, and lateral fluoroscopic projection used confirm needle placement in all planes. Images permanently stored in EMR. Interpretation: No contrast injected. I personally interpreted the imaging intraoperatively. Adequate needle placement confirmed in multiple planes. Permanent images saved into the patient's record.  Antibiotic Prophylaxis:   Anti-infectives (From admission, onward)   None     Indication(s): None identified  Post-operative Assessment:  Post-procedure Vital Signs:  Pulse/HCG Rate: 75 (nsr)86 Temp: (!) 97.3 F (36.3 C) Resp: (!) 22 BP: 106/61 SpO2: 99 %  EBL: None  Complications: No immediate post-treatment complications observed by team, or reported by patient.  Note: The patient tolerated the entire procedure well. A repeat set of vitals were taken after the  procedure and the patient was kept under observation following institutional policy, for this type of procedure. Post-procedural neurological assessment was performed, showing return to baseline, prior to discharge. The patient was provided with post-procedure discharge instructions, including a section on how to identify potential problems. Should any problems arise concerning this procedure, the patient was given instructions to immediately contact us, at any time, without hesitation. In any case, we plan to contact the patient by telephone for a follow-up status report regarding this interventional procedure.  Comments:  No additional relevant information.  Plan of Care  Orders:  Orders Placed This Encounter  Procedures  . LUMBAR FACET(MEDIAL BRANCH NERVE BLOCK) MBNB    Scheduling Instructions:     Procedure: Lumbar facet block (AKA.: Lumbosacral medial branch nerve block)     Side: Bilateral     Level: L1-2 Facets (T12, L1, L2 Medial Branch Nerves)     Sedation: Patient's choice.     Timeframe: Today    Order Specific Question:   Where will this procedure be performed?    Answer:   [facility]  . [facility] C-ARM 1-60 MIN NO REPORT    Intraoperative interpretation by procedural physician at [facility].    Standing Status:   Standing    Number of Occurrences:   1    Order Specific Question:   Reason for exam:    Answer:   Assistance in needle guidance and placement for procedures requiring needle placement in or near specific anatomical locations not easily accessible without such assistance.  . Informed Consent Details: Physician/Practitioner Attestation; Transcribe to consent form and obtain patient signature    Nursing Order: Transcribe to consent form and obtain patient signature. Note: Always confirm laterality of pain with Mr. Carleton, before procedure.    Order Specific Question:   Physician/Practitioner attestation of informed consent for procedure/surgical  case    Answer:  I, the physician/practitioner, attest that I have discussed with the patient the benefits, risks, side effects, alternatives, likelihood of achieving goals and potential problems during recovery for the procedure that I have provided informed consent.    Order Specific Question:   Procedure    Answer:   Lumbar Facet Block  under fluoroscopic guidance    Order Specific Question:   Physician/Practitioner performing the procedure    Answer:   Jesselee Poth A. Dossie Arbour MD    Order Specific Question:   Indication/Reason    Answer:   Low Back Pain, with our without leg pain, due to Facet Joint Arthralgia (Joint Pain) Spondylosis (Arthritis of the Spine), without myelopathy or radiculopathy (Nerve Damage).  . Care order/instruction: Please confirm that the patient has stopped the Plavix (Clopidogrel) x 7-10 days prior to procedure or surgery.    Please confirm that the patient has stopped the Plavix (Clopidogrel) x 7-10 days prior to procedure or surgery.    Standing Status:   Standing    Number of Occurrences:   1  . Provide equipment / supplies at bedside    "Block Tray" (Disposable  single use) Needle type: SpinalSpinal Amount/quantity: 3 Size: Regular (3.5-inch) Gauge: 22G    Standing Status:   Standing    Number of Occurrences:   1    Order Specific Question:   Specify    Answer:   Block Tray  . Bleeding precautions    Standing Status:   Standing    Number of Occurrences:   1   Chronic Opioid Analgesic:  None MME/day: 0 mg/day   Medications ordered for procedure: Meds ordered this encounter  Medications  . lidocaine (XYLOCAINE) 2 % (with pres) injection 400 mg  . lactated ringers infusion 1,000 mL  . midazolam (VERSED) 5 MG/5ML injection 1-2 mg    Make sure Flumazenil is available in the pyxis when using this medication. If oversedation occurs, administer 0.2 mg IV over 15 sec. If after 45 sec no response, administer 0.2 mg again over 1 min; may repeat at 1 min  intervals; not to exceed 4 doses (1 mg)  . fentaNYL (SUBLIMAZE) injection 25-50 mcg    Make sure Narcan is available in the pyxis when using this medication. In the event of respiratory depression (RR< 8/min): Titrate NARCAN (naloxone) in increments of 0.1 to 0.2 mg IV at 2-3 minute intervals, until desired degree of reversal.  . ropivacaine (PF) 2 mg/mL (0.2%) (NAROPIN) injection 9 mL  . triamcinolone acetonide (KENALOG-40) injection 40 mg   Medications administered: We administered lidocaine, lactated ringers, midazolam, fentaNYL, ropivacaine (PF) 2 mg/mL (0.2%), and triamcinolone acetonide.  See the medical record for exact dosing, route, and time of administration.  Follow-up plan:   Return in about 2 weeks (around 06/09/2020) for on afternoon of procedure day, (F2F), (PPE).       Interventional Therapies  Risk  Complexity Considerations:   NOTE: Plavix Anticoagulation (Stop: 7 days  Restart: 2 hours)      Planned  Pending:   Diagnostic bilateral L1-2 Lumbar facet MBB (T12, L1, and L2 medial branch nerve) #1    Under consideration:   Diagnostic bilateral L1-2 Lumbar facet MBB (T12, L1, and L2 medial branch nerve) #1    Completed:   None at this time   Therapeutic  Palliative (PRN) options:   None established     Recent Visits Date Type Provider Dept  04/22/20 Office Visit Milinda Pointer, [facility]  03/16/20 Office Visit Dossie Arbour,  Beatriz Chancellor, MD [facility]  Showing recent visits within past 90 days and meeting all other requirements Today's Visits Date Type Provider Dept  05/26/20 Procedure visit Milinda Pointer, MD [facility]  Showing today's visits and meeting all other requirements Future Appointments Date Type Provider Dept  06/11/20 Appointment Milinda Pointer, MD [facility]  Showing future appointments within next 90 days and meeting all other requirements  Disposition: Discharge home  Discharge (Date   Time): 05/26/2020; 1012 hrs.   Primary Care Physician: [name], NP Location: [facility] Note by: Gaspar Cola, MD Date: 05/26/2020; Time: 11:44 AM  Disclaimer:  Medicine is not an Chief Strategy Officer. The only guarantee in medicine is that nothing is guaranteed. It is important to note that the decision to proceed with this intervention was based on the information collected from the patient. The Data and conclusions were drawn from the patient's questionnaire, the interview, and the physical examination. Because the information was provided in large part by the patient, it cannot be guaranteed that it has not been purposely or unconsciously manipulated. Every effort has been made to obtain as much relevant data as possible for this evaluation. It is important to note that the conclusions that lead to this procedure are derived in large part from the available data. Always take into account that the treatment will also be dependent on availability of resources and existing treatment guidelines, considered by other Pain Management Practitioners as being common knowledge and practice, at the time of the intervention. For Medico-Legal purposes, it is also important to point out that variation in procedural techniques and pharmacological choices are the acceptable norm. The indications, contraindications, technique, and results of the above procedure should only be interpreted and judged by a Board-Certified Interventional Pain Specialist with extensive familiarity and expertise in the same exact procedure and technique.

## 2020-05-27 ENCOUNTER — Telehealth: Payer: Self-pay | Admitting: *Deleted

## 2020-05-27 NOTE — Telephone Encounter (Signed)
Spoke with patient re; procedure on yesterday no questions or concerns. 

## 2020-06-10 NOTE — Progress Notes (Signed)
PROVIDER NOTE: Information contained herein reflects review and annotations entered in association with encounter. Interpretation of such information and data should be left to medically-trained personnel. Information provided to patient can be located elsewhere in the medical record under "Patient Instructions". Document created using STT-dictation technology, any transcriptional errors that may result from process are unintentional.    Patient: Keith Carter  Service Category: E/M  Provider: Gaspar Cola, MD  DOB: 1955/05/04  DOS: 06/11/2020  Specialty: Interventional Pain Management  MRN: [id number]  Setting: Ambulatory outpatient  PCP: [name], NP  Type: Established Patient    Referring Provider: [name], *  Location: Office  Delivery: Face-to-face     HPI  Keith Carter, a 65 y.o. 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 Keith Carter primary complain today is Back Pain (Low back pain bilateral ) Last encounter: My last encounter with him was on 05/26/2020. 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 Pain Assessment: Severity  of Chronic pain is reported as a 2 /10. Location: Back Lower,Left,Right/denies. Onset: More than a month ago. Quality: Dull,Discomfort,Constant. Timing: Constant. Modifying factor(s): procedure, rest and positioning. Vitals:  height is 5' 10"  (1.778 m) and weight is 265 lb (120.2 kg). His temporal temperature is 97.3 F (36.3 C) (abnormal). His blood pressure is 136/92 (abnormal) and his pulse is 84. His respiration is 15 and oxygen saturation is 96%.   Reason for encounter: post-procedure assessment.  The patient indicates having done extremely well with the thoracolumbar facet medial branch block of the T12, L1, and L2.  He refers having attained 100% relief of the pain for the duration of the numbing medicine.  After that, some of the pain began to come back and he is currently enjoying an ongoing 70% improvement.  He refers still having some pain in the lower portion of the back where we did not inject however, I have shared with him pictures of his fusion and it is clear to me that he will continue to have some degree of pain, no matter what.  However, because of the improvement from the recent treatment, he has come back now to swimming.  For the time being, he is doing well and we will continue to monitor.  Should the pain return, he was asked to give [place] a call so that we can repeat the procedure.  If he continues to get good relief of the pain, but the procedure has to be repeated often we will consider the possibility of radiofrequency ablation.  However, I will not be attempting radiofrequency ablation in the lower portion of his back where he has extensive manipulation and fusion.  According to the patient and his primary pain is that of the lower back in the midline(Bilateral) (R>L). He has had one back surgery performed by Dr. Cari Caraway approximately  3 years ago. No lower extremity pain.  Flexion-extension x-rays of the lumbar spine done on 03/16/2020 revealed:  IMPRESSION: 1. Probable  mild anterior translation of L1 on L2 with baseline anterolisthesis of L1 on L2 approximately 7 mm, increasing to 9-10 mm with flexion. 2. Lucency about the L2 pedicle screws bilaterally similar compared to recent CT, suggests underlying loosening as on the CT.  Post-Procedure Evaluation  Procedure (05/26/2020): Diagnostic bilateral thoracolumbar facet MBB (T12, L1, L2) #1 under fluoroscopic guidance and IV sedation Pre-procedure pain level: 7/10 Post-procedure: 0/10 (100% relief)  Sedation: Sedation provided.  Effectiveness during initial hour after procedure(Ultra-Short Term Relief): 100 % .  Local anesthetic used: Long-acting (4-6 hours) Effectiveness: Defined as any analgesic benefit obtained secondary to the administration of local anesthetics. This carries significant diagnostic value as to the etiological location, or anatomical origin, of the pain. Duration of benefit is expected to coincide with the duration of the local anesthetic used.  Effectiveness during initial 4-6 hours after procedure(Short-Term Relief): 100 % .  Long-term benefit: Defined as any relief past the pharmacologic duration of the local anesthetics.  Effectiveness past the initial 6 hours after procedure(Long-Term Relief): 70 % .  Current benefits: Defined as benefit that persist at this time.   Analgesia:  70% ongoing relief of the pain. Function: Keith Carter reports improvement in function ROM: Keith Carter reports improvement in ROM  Pharmacotherapy Assessment   Analgesic: None MME/day: 0 mg/day   Monitoring: [place] PMP: PDMP reviewed during this encounter.       Pharmacotherapy: No side-effects or adverse reactions reported. Compliance: No problems identified. Effectiveness: Clinically acceptable.  No notes on file  UDS:  Summary  Date Value Ref Range Status  03/16/2020 FINAL  Final    Comment:    ==================================================================== [facility] COMP DRUG  ANALYSIS,UR ==================================================================== Test                             Result       Flag       Units Drug Present and Declared for Prescription Verification   Paroxetine                     PRESENT      EXPECTED  Drug Present not Declared for Prescription Verification   Ibuprofen                      PRESENT      UNEXPECTED  Drug Absent but Declared for Prescription Verification   Acetaminophen                  Not Detected UNEXPECTED    Acetaminophen, as indicated in the declared medication list, is    not always detected even when used as directed.    Salicylate                     Not Detected UNEXPECTED    Aspirin, as indicated in the declared medication list, is not    always detected even when used as directed.  ==================================================================== Test                      Result    Flag   Units      Ref Range   Creatinine              300  mg/dL      >=20 ==================================================================== Declared Medications:  The flagging and interpretation on this report are based on the  following declared medications.  Unexpected results may arise from  inaccuracies in the declared medications.   **Note: The testing scope of this panel includes these medications:   Paroxetine (Paxil)   **Note: The testing scope of this panel does not include small to  moderate amounts of these reported medications:   Acetaminophen (Tylenol)  Aspirin (Aspirin 81)   **Note: The testing scope of this panel does not include following  reported medications:   Amlodipine (Norvasc)  Atorvastatin (Lipitor)  Celecoxib (Celebrex)  Cholecalciferol  Clopidogrel (Plavix)  Lisinopril (Zestril)  Metformin  Potassium  Sildenafil (Revatio)  Tamsulosin (Flomax) ==================================================================== For clinical consultation, please call [phone]. ====================================================================      ROS  Constitutional: Denies any fever or chills Gastrointestinal: No reported hemesis, hematochezia, vomiting, or acute GI distress Musculoskeletal: Denies any acute onset joint swelling, redness, loss of ROM, or weakness Neurological: No reported episodes of acute onset apraxia, aphasia, dysarthria, agnosia, amnesia, paralysis, loss of coordination, or loss of consciousness  Medication Review  Cholecalciferol, PARoxetine, Potassium, acetaminophen, amLODipine, atorvastatin, celecoxib, clopidogrel, lisinopril, metFORMIN, sildenafil, and tamsulosin  History Review  Allergy: Keith Carter has No Known Allergies. Drug: Keith Carter  reports no history of drug use. Alcohol:  reports current alcohol use. Tobacco:  reports that he has never smoked. He has never used smokeless tobacco. Social: Keith Carter  reports that he has never smoked. He has never used smokeless tobacco. He reports current alcohol use. He reports that he does not use drugs. Medical:  has a past medical history of Anxiety, Dyslipidemia, Enlarged prostate, Hypertension, Prediabetes, and TIA (transient ischemic attack). Surgical: Keith Carter  has a past surgical history that includes Back surgery and Hernia repair. Family: family history is not on file.  Laboratory Chemistry Profile   Renal Lab Results  Component Value Date   BUN 19 03/16/2020   CREATININE 1.13 03/16/2020   BCR 17 03/16/2020   GFRAA 78 03/16/2020   GFRNONAA 67 03/16/2020     Hepatic Lab Results  Component Value Date   AST 14 03/16/2020   ALT 26 10/02/2019   ALBUMIN 3.8 03/16/2020   ALKPHOS 97 03/16/2020     Electrolytes Lab Results  Component Value Date   NA 138 03/16/2020   K 5.4 (H) 03/16/2020   CL 100 03/16/2020   CALCIUM 9.5 03/16/2020   MG 2.0 03/16/2020   PHOS 3.4 10/02/2019     Bone Lab Results  Component Value Date   25OHVITD1 28 (L) 03/16/2020    25OHVITD2 <1.0 03/16/2020   25OHVITD3 28 03/16/2020     Inflammation (CRP: Acute Phase) (ESR: Chronic Phase) Lab Results  Component Value Date   CRP 1 03/16/2020   ESRSEDRATE 8 03/16/2020       Note: Above Lab results reviewed.  Recent Imaging Review  DG PAIN CLINIC C-ARM 1-60 MIN NO REPORT Fluoro was used, but no Radiologist interpretation will be provided.  Please refer to "NOTES" tab for provider progress note. Note: Reviewed        Physical Exam  General appearance: Well nourished, well developed, and well hydrated. In no apparent acute distress Mental status: Alert, oriented x 3 (person, place, & time)       Respiratory: No evidence of acute respiratory distress Eyes: PERLA Vitals: BP (!) 136/92 (BP Location: Right Arm, Patient Position: Sitting, Cuff Size: Large)   Pulse  84   Temp (!) 97.3 F (36.3 C) (Temporal)   Resp 15   Ht 5' 10"  (1.778 m)   Wt 265 lb (120.2 kg)   SpO2 96%   BMI 38.02 kg/m  BMI: Estimated body mass index is 38.02 kg/m as calculated from the following:   Height as of this encounter: 5' 10"  (1.778 m).   Weight as of this encounter: 265 lb (120.2 kg). Ideal: Ideal body weight: 73 kg (160 lb 15 oz) Adjusted ideal body weight: 91.9 kg (202 lb 9 oz)  Assessment   Status Diagnosis  Improved Controlled Controlled 1. Lumbar facet joint syndrome (Bilateral)   2. Failed back surgical syndrome   3. 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 Lumbar Grade 1 Anterolisthesis of L1 over L2 (2 mm anterior translation with flexion)   5. Lumbar Grade 1 Retrolisthesis of L2/L3 and L3/L4   6. Chronic pain syndrome      Updated Problems: No problems updated.  Plan of Care  Problem-specific:  No problem-specific Assessment & Plan notes found for this encounter.  Keith Carter [facility] has a current medication list which includes the following long-term medication(s): amlodipine, atorvastatin, lisinopril, metformin, potassium,  and paroxetine.  Pharmacotherapy (Medications Ordered): No orders of the defined types were placed in this encounter.  Orders:  No orders of the defined types were placed in this encounter.  Follow-up plan:   Return if symptoms worsen or fail to improve.      Interventional Therapies  Risk  Complexity Considerations:   NOTE: Plavix Anticoagulation (Stop: 7 days  Restart: 2 hours)      Planned  Pending:      Under consideration:   Diagnostic bilateral L1-2 Lumbar facet MBB (T12, L1, L2) #2  Diagnostic intrathecal injection of opioid analgesic as a test for intrathecal pump    Completed:   Diagnostic bilateral L1-2 Lumbar facet MBB (T12, L1, L2) x1 (05/26/2020)    Therapeutic  Palliative (PRN) options:   None established    Recent Visits Date Type Provider Dept  05/26/20 Procedure visit Milinda Pointer, MD [facility]  04/22/20 Office Visit Milinda Pointer, MD [facility]  03/16/20 Office Visit Milinda Pointer, MD [facility]  Showing recent visits within past 90 days and meeting all other requirements Today's Visits Date Type Provider Dept  06/11/20 Office Visit Milinda Pointer, MD [facility]  Showing today's visits and meeting all other requirements Future Appointments No visits were found meeting these conditions. Showing future appointments within next 90 days and meeting all other requirements  I discussed the assessment and treatment plan with the patient. The patient was provided an opportunity to ask questions and all were answered. The patient agreed with the plan and demonstrated an understanding of the instructions.  Patient advised to call back or seek an in-person evaluation if the symptoms or condition worsens.  Duration of encounter: 30 minutes.  Note by: Gaspar Cola, MD Date: 06/11/2020; Time: 2:26 PM

## 2020-06-11 ENCOUNTER — Encounter: Payer: Self-pay | Admitting: Pain Medicine

## 2020-06-11 ENCOUNTER — Other Ambulatory Visit: Payer: Self-pay

## 2020-06-11 ENCOUNTER — Ambulatory Visit: Payer: Medicare HMO | Attending: Pain Medicine | Admitting: Pain Medicine

## 2020-06-11 VITALS — BP 136/92 | HR 84 | Temp 97.3°F | Resp 15 | Ht 70.0 in | Wt 265.0 lb

## 2020-06-11 DIAGNOSIS — G894 Chronic pain syndrome: Secondary | ICD-10-CM | POA: Diagnosis present

## 2020-06-11 DIAGNOSIS — M4316 Spondylolisthesis, lumbar region: Secondary | ICD-10-CM | POA: Diagnosis present

## 2020-06-11 DIAGNOSIS — M47816 Spondylosis without myelopathy or radiculopathy, lumbar region: Secondary | ICD-10-CM | POA: Diagnosis present

## 2020-06-11 DIAGNOSIS — M4310 Spondylolisthesis, site unspecified: Secondary | ICD-10-CM | POA: Insufficient documentation

## 2020-06-11 DIAGNOSIS — M961 Postlaminectomy syndrome, not elsewhere classified: Secondary | ICD-10-CM | POA: Insufficient documentation

## 2020-06-11 DIAGNOSIS — M4327 Fusion of spine, lumbosacral region: Secondary | ICD-10-CM | POA: Diagnosis present

## 2020-06-11 NOTE — Patient Instructions (Signed)

## 2020-07-14 ENCOUNTER — Telehealth: Payer: Self-pay

## 2020-07-14 NOTE — Telephone Encounter (Signed)
Pt  wants to speak with anurse to see when he can be scheduled for a procedure for injections in his lower back we do not have an order in place

## 2020-07-14 NOTE — Telephone Encounter (Signed)
Pt wants to speak with anurse to see when he can be scheduled

## 2020-07-28 ENCOUNTER — Telehealth: Payer: Self-pay | Admitting: *Deleted

## 2020-07-28 DIAGNOSIS — M5137 Other intervertebral disc degeneration, lumbosacral region: Secondary | ICD-10-CM | POA: Insufficient documentation

## 2020-07-28 NOTE — Telephone Encounter (Signed)
Called for pre visit assessment. No answer. LVM. 

## 2020-07-28 NOTE — Progress Notes (Signed)
Patient: Keith Carter  Service Category: E/M  Provider: Gaspar Cola, MD  DOB: Aug 27, 1955  DOS: 07/29/2020  Location: Office  MRN: [id number]  Setting: Ambulatory outpatient  Referring Provider: [name], *  Type: Established Patient  Specialty: Interventional Pain Management  PCP: [name], NP  Location: Remote location  Delivery: TeleHealth     Virtual Encounter - Pain Management PROVIDER NOTE: Information contained herein reflects review and annotations entered in association with encounter. Interpretation of such information and data should be left to medically-trained personnel. Information provided to patient can be located elsewhere in the medical record under "Patient Instructions". Document created using STT-dictation technology, any transcriptional errors that may result from process are unintentional.    Contact & Pharmacy Preferred: [phone] Home: [phone] (home) Mobile: There is no such number on file (mobile). E-mail: [email or web address]  [place], [place] [facility] [place] [address] [place] [address] Phone: [phone] Fax: [phone]   Pre-screening  Mr. Hanf offered "in-person" vs "virtual" encounter. He indicated preferring virtual for this encounter.   Reason COVID-19*  Social distancing based on CDC and AMA recommendations.   I contacted Pedram Goodchild Obeso on 07/29/2020 via telephone.      I clearly identified myself as Gaspar Cola, MD. I verified that I was speaking with the correct person using two identifiers (Name: Caedmon Louque, and date of birth: 30-Jun-1955).  Consent I sought verbal advanced consent from [place] for virtual visit interactions. I informed Mr. Gruenewald of possible security and privacy concerns, risks, and limitations associated with providing "not-in-person" medical evaluation and management services. I also informed Mr. Gettis of the availability of "in-person"  appointments. Finally, I informed him that there would be a charge for the virtual visit and that he could be  personally, fully or partially, financially responsible for it. Mr. Obremski expressed understanding and agreed to proceed.   Historic Elements   Mr. Paolo Okane Heiberger is a 65 y.o. year old, male patient evaluated today after our last contact on 06/11/2020. Mr. Murty  has a past medical history of Anxiety, Dyslipidemia, Enlarged prostate, Hypertension, Prediabetes, and TIA (transient ischemic attack). He also  has a past surgical history that includes Back surgery and Hernia repair. Mr. Anaya has a current medication list which includes the following prescription(s): acetaminophen, amlodipine, atorvastatin, celecoxib, cholecalciferol, clopidogrel, lisinopril, metformin, paroxetine, potassium, sildenafil, and tamsulosin. He  reports that he has never smoked. He has never used smokeless tobacco. He reports current alcohol use. He reports that he does not use drugs. Mr. Werts has No Known Allergies.   HPI  Today, he is being contacted for follow-up evaluation to discuss possible procedure.  The patient indicates currently having a recurrence of his mid to lower back pain.  Apparently the bilateral T12, L1, and L2 medial branch block did provide him with a considerable amount of pain relief until recently when apparently started wearing off.  He would like for that exact same procedure to be repeated again.  Diagnostic bilateral L1-2 Lumbar facet MBB (T12, L1, L2) #2     Pharmacotherapy Assessment  Analgesic: None MME/day: 0 mg/day   Monitoring: [place] PMP: PDMP reviewed during this encounter.       Pharmacotherapy: No side-effects or adverse reactions reported. Compliance: No problems identified. Effectiveness: Clinically acceptable. Plan: Refer to "POC".  UDS:  Summary  Date Value Ref Range Status  03/16/2020 FINAL  Final    Comment:     ====================================================================  TOX[facility] COMP DRUG ANALYSIS,UR ==================================================================== Test                             Result       Flag       Units Drug Present and Declared for Prescription Verification   Paroxetine                     PRESENT      EXPECTED  Drug Present not Declared for Prescription Verification   Ibuprofen                      PRESENT      UNEXPECTED  Drug Absent but Declared for Prescription Verification   Acetaminophen                  Not Detected UNEXPECTED    Acetaminophen, as indicated in the declared medication list, is    not always detected even when used as directed.    Salicylate                     Not Detected UNEXPECTED    Aspirin, as indicated in the declared medication list, is not    always detected even when used as directed.  ==================================================================== Test                      Result    Flag   Units      Ref Range   Creatinine              300              mg/dL      >=20 ==================================================================== Declared Medications:  The flagging and interpretation on this report are based on the  following declared medications.  Unexpected results may arise from  inaccuracies in the declared medications.   **Note: The testing scope of this panel includes these medications:   Paroxetine (Paxil)   **Note: The testing scope of this panel does not include small to  moderate amounts of these reported medications:   Acetaminophen (Tylenol)  Aspirin (Aspirin 81)   **Note: The testing scope of this panel does not include following  reported medications:   Amlodipine (Norvasc)  Atorvastatin (Lipitor)  Celecoxib (Celebrex)  Cholecalciferol  Clopidogrel (Plavix)  Lisinopril (Zestril)  Metformin  Potassium  Sildenafil (Revatio)  Tamsulosin  (Flomax) ==================================================================== For clinical consultation, please call [phone]. ====================================================================     Laboratory Chemistry Profile   Renal Lab Results  Component Value Date   BUN 19 03/16/2020   CREATININE 1.13 03/16/2020   BCR 17 03/16/2020   GFRAA 78 03/16/2020   GFRNONAA 67 03/16/2020    Hepatic Lab Results  Component Value Date   AST 14 03/16/2020   ALT 26 10/02/2019   ALBUMIN 3.8 03/16/2020   ALKPHOS 97 03/16/2020    Electrolytes Lab Results  Component Value Date   NA 138 03/16/2020   K 5.4 (H) 03/16/2020   CL 100 03/16/2020   CALCIUM 9.5 03/16/2020   MG 2.0 03/16/2020   PHOS 3.4 10/02/2019    Bone Lab Results  Component Value Date   25OHVITD1 28 (L) 03/16/2020   25OHVITD2 <1.0 03/16/2020   25OHVITD3 28 03/16/2020    Inflammation (CRP: Acute Phase) (ESR: Chronic Phase) Lab Results  Component Value Date   CRP 1 03/16/2020   ESRSEDRATE 8 03/16/2020  Note: Above Lab results reviewed.  Imaging  DG PAIN CLINIC C-ARM 1-60 MIN NO REPORT Fluoro was used, but no Radiologist interpretation will be provided.  Please refer to "NOTES" tab for provider progress note.  Assessment  The primary encounter diagnosis was Chronic pain syndrome. 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  Plan of Care  Problem-specific:  No problem-specific Assessment & Plan notes found for this encounter.  Mr. Briscoe Daniello [facility] has a current medication list  which includes the following long-term medication(s): amlodipine, atorvastatin, lisinopril, metformin, paroxetine, and potassium.  Pharmacotherapy (Medications Ordered): No orders of the defined types were placed in this encounter.  Orders:  Orders Placed This Encounter  Procedures   LUMBAR FACET(MEDIAL BRANCH NERVE BLOCK) MBNB    Standing Status:   Future    Standing Expiration Date:   08/27/2020    Scheduling Instructions:     Procedure: Lumbar facet block (AKA.: Lumbosacral medial branch nerve block)     Side: Bilateral     Level: T12, L1, L2 Medial Branch Nerves     Sedation: Patient's choice.     Timeframe: ASAA    Order Specific Question:   Where will this procedure be performed?    Answer:   [facility]   Blood Thinner Instructions to Nursing    Always make sure patient has clearance from prescribing physician to stop blood thinners for interventional therapies. If the patient requires a Lovenox-bridge therapy, make sure arrangements are made to institute it with the assistance of the PCP.    Scheduling Instructions:     Have Mr. Vandrunen stop the Plavix (Clopidogrel) x 7-10 days prior to procedure or surgery.    Follow-up plan:   Return for Procedure (w/ sedation): (B) T-FCT BLK #2 (T12, L1, L2 Medial Branch), (Blood Thinner Protocol).     Interventional Therapies  Risk  Complexity Considerations:   NOTE: Plavix Anticoagulation (Stop: 7 days  Restart: 2 hours)      Planned  Pending:      Under consideration:   Diagnostic bilateral L1-2 Lumbar facet MBB (T12, L1, L2) #2  Diagnostic intrathecal injection of opioid analgesic as a test for intrathecal pump    Completed:   Diagnostic bilateral L1-2 Lumbar facet MBB (T12, L1, L2) x1 (05/26/2020) (7-0) (100/100/70/70)    Therapeutic  Palliative (PRN) options:   None established    Recent Visits Date Type Provider Dept  06/11/20 Office Visit Milinda Pointer, MD [facility]  05/26/20 Procedure  visit Milinda Pointer, MD [facility]  Showing recent visits within past 90 days and meeting all other requirements Today's Visits Date Type Provider Dept  07/29/20 Telemedicine Milinda Pointer, MD [facility]  Showing today's visits and meeting all other requirements Future Appointments No visits were found meeting these conditions. Showing future appointments within next 90 days and meeting all other requirements I discussed the assessment and treatment plan with the patient. The patient was provided an opportunity to ask questions and all were answered. The patient agreed with the plan and demonstrated an understanding of the instructions.  Patient advised to call back or seek an  in-person evaluation if the symptoms or condition worsens.  Duration of encounter: 15 minutes.  Note by: Gaspar Cola, MD Date: 07/29/2020; Time: 9:36 AM

## 2020-07-29 ENCOUNTER — Ambulatory Visit: Payer: Medicare HMO | Attending: Pain Medicine | Admitting: Pain Medicine

## 2020-07-29 ENCOUNTER — Other Ambulatory Visit: Payer: Self-pay

## 2020-07-29 DIAGNOSIS — G8929 Other chronic pain: Secondary | ICD-10-CM

## 2020-07-29 DIAGNOSIS — M961 Postlaminectomy syndrome, not elsewhere classified: Secondary | ICD-10-CM

## 2020-07-29 DIAGNOSIS — G894 Chronic pain syndrome: Secondary | ICD-10-CM

## 2020-07-29 DIAGNOSIS — M4327 Fusion of spine, lumbosacral region: Secondary | ICD-10-CM

## 2020-07-29 DIAGNOSIS — M47816 Spondylosis without myelopathy or radiculopathy, lumbar region: Secondary | ICD-10-CM

## 2020-07-29 DIAGNOSIS — M5137 Other intervertebral disc degeneration, lumbosacral region: Secondary | ICD-10-CM | POA: Diagnosis not present

## 2020-07-29 DIAGNOSIS — Z7901 Long term (current) use of anticoagulants: Secondary | ICD-10-CM

## 2020-07-29 DIAGNOSIS — M4316 Spondylolisthesis, lumbar region: Secondary | ICD-10-CM

## 2020-07-29 DIAGNOSIS — M4310 Spondylolisthesis, site unspecified: Secondary | ICD-10-CM

## 2020-07-29 DIAGNOSIS — M545 Low back pain, unspecified: Secondary | ICD-10-CM | POA: Diagnosis not present

## 2020-07-29 NOTE — Patient Instructions (Signed)
____________________________________________________________________________________________  Preparing for Procedure with Sedation  Procedure appointments are limited to planned procedures: No Prescription Refills. No disability issues will be discussed. No medication changes will be discussed.  Instructions: Oral Intake: Do not eat or drink anything for at least 8 hours prior to your procedure. (Exception: Blood Pressure Medication. See below.) Transportation: Unless otherwise stated by your physician, you may drive yourself after the procedure. Blood Pressure Medicine: Do not forget to take your blood pressure medicine with a sip of water the morning of the procedure. If your Diastolic (lower reading)is above 100 mmHg, elective cases will be cancelled/rescheduled. Blood thinners: These will need to be stopped for procedures. Notify our staff if you are taking any blood thinners. Depending on which one you take, there will be specific instructions on how and when to stop it. Diabetics on insulin: Notify the staff so that you can be scheduled 1st case in the morning. If your diabetes requires high dose insulin, take only  of your normal insulin dose the morning of the procedure and notify the staff that you have done so. Preventing infections: Shower with an antibacterial soap the morning of your procedure. Build-up your immune system: Take 1000 mg of Vitamin C with every meal (3 times a day) the day prior to your procedure. Antibiotics: Inform the staff if you have a condition or reason that requires you to take antibiotics before dental procedures. Pregnancy: If you are pregnant, call and cancel the procedure. Sickness: If you have a cold, fever, or any active infections, call and cancel the procedure. Arrival: You must be in the facility at least 30 minutes prior to your scheduled procedure. Children: Do not bring children with you. Dress appropriately: Bring dark clothing that you would  not mind if they get stained. Valuables: Do not bring any jewelry or valuables.  Reasons to call and reschedule or cancel your procedure: (Following these recommendations will minimize the risk of a serious complication.) Surgeries: Avoid having procedures within 2 weeks of any surgery. (Avoid for 2 weeks before or after any surgery). Flu Shots: Avoid having procedures within 2 weeks of a flu shots or . (Avoid for 2 weeks before or after immunizations). Barium: Avoid having a procedure within 7-10 days after having had a radiological study involving the use of radiological contrast. (Myelograms, Barium swallow or enema study). Heart attacks: Avoid any elective procedures or surgeries for the initial 6 months after a "Myocardial Infarction" (Heart Attack). Blood thinners: It is imperative that you stop these medications before procedures. Let us know if you if you take any blood thinner.  Infection: Avoid procedures during or within two weeks of an infection (including chest colds or gastrointestinal problems). Symptoms associated with infections include: Localized redness, fever, chills, night sweats or profuse sweating, burning sensation when voiding, cough, congestion, stuffiness, runny nose, sore throat, diarrhea, nausea, vomiting, cold or Flu symptoms, recent or current infections. It is specially important if the infection is over the area that we intend to treat. Heart and lung problems: Symptoms that may suggest an active cardiopulmonary problem include: cough, chest pain, breathing difficulties or shortness of breath, dizziness, ankle swelling, uncontrolled high or unusually low blood pressure, and/or palpitations. If you are experiencing any of these symptoms, cancel your procedure and contact your primary care physician for an evaluation.  Remember:  Regular Business hours are:  Monday to Thursday 8:00 AM to 4:00 PM  Provider's Schedule: Deklyn Trachtenberg, MD:  Procedure days: Tuesday and  Thursday 7:30 AM   to 4:00 PM  Bilal Lateef, MD:  Procedure days: Monday and Wednesday 7:30 AM to 4:00 PM ____________________________________________________________________________________________  ____________________________________________________________________________________________  General Risks and Possible Complications  Patient Responsibilities: It is important that you read this as it is part of your informed consent. It is our duty to inform you of the risks and possible complications associated with treatments offered to you. It is your responsibility as a patient to read this and to ask questions about anything that is not clear or that you believe was not covered in this document.  Patient's Rights: You have the right to refuse treatment. You also have the right to change your mind, even after initially having agreed to have the treatment done. However, under this last option, if you wait until the last second to change your mind, you may be charged for the materials used up to that point.  Introduction: Medicine is not an exact science. Everything in Medicine, including the lack of treatment(s), carries the potential for danger, harm, or loss (which is by definition: Risk). In Medicine, a complication is a secondary problem, condition, or disease that can aggravate an already existing one. All treatments carry the risk of possible complications. The fact that a side effects or complications occurs, does not imply that the treatment was conducted incorrectly. It must be clearly understood that these can happen even when everything is done following the highest safety standards.  No treatment: You can choose not to proceed with the proposed treatment alternative. The "PRO(s)" would include: avoiding the risk of complications associated with the therapy. The "CON(s)" would include: not getting any of the treatment benefits. These benefits fall under one of three categories: diagnostic;  therapeutic; and/or palliative. Diagnostic benefits include: getting information which can ultimately lead to improvement of the disease or symptom(s). Therapeutic benefits are those associated with the successful treatment of the disease. Finally, palliative benefits are those related to the decrease of the primary symptoms, without necessarily curing the condition (example: decreasing the pain from a flare-up of a chronic condition, such as incurable terminal cancer).  General Risks and Complications: These are associated to most interventional treatments. They can occur alone, or in combination. They fall under one of the following six (6) categories: no benefit or worsening of symptoms; bleeding; infection; nerve damage; allergic reactions; and/or death. No benefits or worsening of symptoms: In Medicine there are no guarantees, only probabilities. No healthcare provider can ever guarantee that a medical treatment will work, they can only state the probability that it may. Furthermore, there is always the possibility that the condition may worsen, either directly, or indirectly, as a consequence of the treatment. Bleeding: This is more common if the patient is taking a blood thinner, either prescription or over the counter (example: Goody Powders, Fish oil, Aspirin, Garlic, etc.), or if suffering a condition associated with impaired coagulation (example: Hemophilia, cirrhosis of the liver, low platelet counts, etc.). However, even if you do not have one on these, it can still happen. If you have any of these conditions, or take one of these drugs, make sure to notify your treating physician. Infection: This is more common in patients with a compromised immune system, either due to disease (example: diabetes, cancer, human immunodeficiency virus [HIV], etc.), or due to medications or treatments (example: therapies used to treat cancer and rheumatological diseases). However, even if you do not have one on  these, it can still happen. If you have any of these conditions, or take one of   these drugs, make sure to notify your treating physician. Nerve Damage: This is more common when the treatment is an invasive one, but it can also happen with the use of medications, such as those used in the treatment of cancer. The damage can occur to small secondary nerves, or to large primary ones, such as those in the spinal cord and brain. This damage may be temporary or permanent and it may lead to impairments that can range from temporary numbness to permanent paralysis and/or brain death. Allergic Reactions: Any time a substance or material comes in contact with our body, there is the possibility of an allergic reaction. These can range from a mild skin rash (contact dermatitis) to a severe systemic reaction (anaphylactic reaction), which can result in death. Death: In general, any medical intervention can result in death, most of the time due to an unforeseen complication. ____________________________________________________________________________________________ ____________________________________________________________________________________________  Blood Thinners  IMPORTANT NOTICE:  If you take any of these, make sure to notify the nursing staff.  Failure to do so may result in injury.  Recommended time intervals to stop and restart blood-thinners, before & after invasive procedures  Generic Name Brand Name Pre-procedure. Stop this long before procedure. Post-procedure. Minimum waiting period before restarting.  Abciximab Reopro 15 days 2 hrs  Alteplase Activase 10 days 10 days  Anagrelide Agrylin    Apixaban Eliquis 3 days 6 hrs  Cilostazol Pletal 3 days 5 hrs  Clopidogrel Plavix 7-10 days 2 hrs  Dabigatran Pradaxa 5 days 6 hrs  Dalteparin Fragmin 24 hours 4 hrs  Dipyridamole Aggrenox 11days 2 hrs  Edoxaban Lixiana; Savaysa 3 days 2 hrs  Enoxaparin  Lovenox 24 hours 4 hrs  Eptifibatide  Integrillin 8 hours 2 hrs  Fondaparinux  Arixtra 72 hours 12 hrs  Hydroxychloroquine Plaquenil 11 days   Prasugrel Effient 7-10 days 6 hrs  Reteplase Retavase 10 days 10 days  Rivaroxaban Xarelto 3 days 6 hrs  Ticagrelor Brilinta 5-7 days 6 hrs  Ticlopidine Ticlid 10-14 days 2 hrs  Tinzaparin Innohep 24 hours 4 hrs  Tirofiban Aggrastat 8 hours 2 hrs  Warfarin Coumadin 5 days 2 hrs   Other medications with blood-thinning effects  Product indications Generic (Brand) names Note  Cholesterol Lipitor Stop 4 days before procedure  Blood thinner (injectable) Heparin (LMW or LMWH Heparin) Stop 24 hours before procedure  Cancer Ibrutinib (Imbruvica) Stop 7 days before procedure  Malaria/Rheumatoid Hydroxychloroquine (Plaquenil) Stop 11 days before procedure  Thrombolytics  10 days before or after procedures   Over-the-counter (OTC) Products with blood-thinning effects  Product Common names Stop Time  Aspirin > 325 mg Goody Powders, Excedrin, etc. 11 days  Aspirin ? 81 mg  7 days  Fish oil  4 days  Garlic supplements  7 days  Ginkgo biloba  36 hours  Ginseng  24 hours  NSAIDs Ibuprofen, Naprosyn, etc. 3 days  Vitamin E  4 days   ____________________________________________________________________________________________

## 2020-08-12 ENCOUNTER — Telehealth: Payer: Medicare HMO | Admitting: Pain Medicine

## 2020-08-18 ENCOUNTER — Other Ambulatory Visit: Payer: Self-pay

## 2020-08-18 ENCOUNTER — Ambulatory Visit (HOSPITAL_BASED_OUTPATIENT_CLINIC_OR_DEPARTMENT_OTHER): Payer: Medicare HMO | Admitting: Pain Medicine

## 2020-08-18 ENCOUNTER — Encounter: Payer: Self-pay | Admitting: Pain Medicine

## 2020-08-18 ENCOUNTER — Ambulatory Visit
Admission: RE | Admit: 2020-08-18 | Discharge: 2020-08-18 | Disposition: A | Discharge status: Home / Self Care | Payer: Medicare HMO | Source: Ambulatory Visit | Attending: Pain Medicine | Admitting: Pain Medicine

## 2020-08-18 VITALS — BP 122/77 | HR 101 | Temp 97.4°F | Resp 20 | Ht 72.0 in | Wt 250.0 lb

## 2020-08-18 DIAGNOSIS — Z7901 Long term (current) use of anticoagulants: Secondary | ICD-10-CM | POA: Diagnosis present

## 2020-08-18 DIAGNOSIS — M4310 Spondylolisthesis, site unspecified: Secondary | ICD-10-CM | POA: Diagnosis present

## 2020-08-18 DIAGNOSIS — M4316 Spondylolisthesis, lumbar region: Secondary | ICD-10-CM

## 2020-08-18 DIAGNOSIS — G8929 Other chronic pain: Secondary | ICD-10-CM | POA: Insufficient documentation

## 2020-08-18 DIAGNOSIS — M5137 Other intervertebral disc degeneration, lumbosacral region: Secondary | ICD-10-CM | POA: Insufficient documentation

## 2020-08-18 DIAGNOSIS — M47817 Spondylosis without myelopathy or radiculopathy, lumbosacral region: Secondary | ICD-10-CM | POA: Insufficient documentation

## 2020-08-18 DIAGNOSIS — M545 Low back pain, unspecified: Secondary | ICD-10-CM | POA: Diagnosis present

## 2020-08-18 DIAGNOSIS — M47816 Spondylosis without myelopathy or radiculopathy, lumbar region: Secondary | ICD-10-CM | POA: Insufficient documentation

## 2020-08-18 DIAGNOSIS — M51379 Other intervertebral disc degeneration, lumbosacral region without mention of lumbar back pain or lower extremity pain: Secondary | ICD-10-CM

## 2020-08-18 MED ORDER — MIDAZOLAM HCL 5 MG/5ML IJ SOLN
1.0000 mg | INTRAMUSCULAR | Status: DC | PRN
  Administered 2020-08-18: 2 mg via INTRAVENOUS
  Filled 2020-08-18: qty 5

## 2020-08-18 MED ORDER — ROPIVACAINE HCL 2 MG/ML IJ SOLN
INTRAMUSCULAR | Status: AC
  Filled 2020-08-18: qty 20

## 2020-08-18 MED ORDER — LIDOCAINE HCL (PF) 2 % IJ SOLN
INTRAMUSCULAR | Status: AC
  Filled 2020-08-18: qty 20

## 2020-08-18 MED ORDER — TRIAMCINOLONE ACETONIDE 40 MG/ML IJ SUSP
80.0000 mg | Freq: Once | INTRAMUSCULAR | Status: AC
  Administered 2020-08-18: 80 mg
  Filled 2020-08-18: qty 2

## 2020-08-18 MED ORDER — LIDOCAINE HCL 2 % IJ SOLN
20.0000 mL | Freq: Once | INTRAMUSCULAR | Status: AC
Start: 2020-08-18 — End: 2020-08-18
  Administered 2020-08-18: 400 mg

## 2020-08-18 MED ORDER — LACTATED RINGERS IV SOLN
1000.0000 mL | Freq: Once | INTRAVENOUS | Status: AC
  Administered 2020-08-18: 1000 mL via INTRAVENOUS

## 2020-08-18 MED ORDER — ROPIVACAINE HCL 2 MG/ML IJ SOLN
18.0000 mL | Freq: Once | INTRAMUSCULAR | Status: AC
  Administered 2020-08-18: 18 mL via PERINEURAL

## 2020-08-18 NOTE — Progress Notes (Signed)
Safety precautions to be maintained throughout the outpatient stay will include: orient to surroundings, keep bed in low position, maintain call bell within reach at all times, provide assistance with transfer out of bed and ambulation.  

## 2020-08-18 NOTE — Patient Instructions (Signed)

## 2020-08-18 NOTE — Progress Notes (Signed)
PROVIDER NOTE: Information contained herein reflects review and annotations entered in association with encounter. Interpretation of such information and data should be left to medically-trained personnel. Information provided to patient can be located elsewhere in the medical record under "Patient Instructions". Document created using STT-dictation technology, any transcriptional errors that may result from process are unintentional.    Patient: Keith Carter  Service Category: Procedure  Provider: Gaspar Cola, MD  DOB: 1955-05-09  DOS: 08/18/2020  Location: [facility]  MRN: [id number]  Setting: Ambulatory - outpatient  Referring Provider: [name], *  Type: Established Patient  Specialty: Interventional Pain Management  PCP: [name], NP   Primary Reason for Visit: Interventional Pain Management Treatment. CC: Back Pain (mid)  Procedure:          Anesthesia, Analgesia, Anxiolysis:  Type: Lumbar Facet, Medial Branch Block(s)          Primary Purpose: Diagnostic Region: Posterolateral Lumbosacral Spine Level: T12, L1, L2 Medial Branch Level(s). Laterality: Bilateral  Type: Minimal (Conscious) Anxiolysis combined with Local Anesthesia Indication(s): Analgesia and Anxiety Route: Intravenous (IV) IV Access: Secured Sedation: Meaningful verbal contact was maintained at all times during the procedure  Local Anesthetic: Lidocaine 1-2%  Position: Prone   Indications: 1. Lumbar facet joint syndrome (Bilateral)   2. Lumbar Grade 1 Anterolisthesis of L1 over L2 (2 mm anterior translation with flexion)   3. Lumbar Grade 1 Retrolisthesis of L2/L3 and L3/L4   4. Lumbosacral facet hypertrophy (Multilevel) (Bilateral)   5. Osteoarthritis of facet joint of lumbar spine   6. DDD (degenerative disc disease), lumbosacral   7. Chronic low back pain (1ry area of Pain) (Bilateral) w/o sciatica   8. Spondylosis without myelopathy or radiculopathy,  lumbosacral region   9. Chronic anticoagulation (PLAVIX)    Pain Score: Pre-procedure: 8 /10 Post-procedure: 0-No pain/10   Pre-op H&P Assessment:  Keith Carter is a 65 y.o. (year old), male patient, seen today for interventional treatment. He  has a past surgical history that includes Back surgery and Hernia repair. Keith Carter has a current medication list which includes the following prescription(s): acetaminophen, amlodipine, atorvastatin, celecoxib, cholecalciferol, clopidogrel, lisinopril, metformin, paroxetine, potassium, sildenafil, and tamsulosin, and the following Facility-Administered Medications: midazolam. His primarily concern today is the Back Pain (mid)  Initial Vital Signs:  Pulse/HCG Rate: (!) 101ECG Heart Rate: 80 Temp: (!) 97.1 F (36.2 C) Resp: 16 BP: (!) 128/91 SpO2: 95 %  BMI: Estimated body mass index is 33.91 kg/m as calculated from the following:   Height as of this encounter: 6' (1.829 m).   Weight as of this encounter: 250 lb (113.4 kg).  Risk Assessment: Allergies: Reviewed. He has No Known Allergies.  Allergy Precautions: None required Coagulopathies: Reviewed. None identified.  Blood-thinner therapy: None at this time Active Infection(s): Reviewed. None identified. Keith Carter is afebrile  Site Confirmation: Keith Carter was asked to confirm the procedure and laterality before marking the site Procedure checklist: Completed Consent: Before the procedure and under the influence of no sedative(s), amnesic(s), or anxiolytics, the patient was informed of the treatment options, risks and possible complications. To fulfill our ethical and legal obligations, as recommended by the American Medical Association's Code of Ethics, I have informed the patient of my clinical impression; the nature and purpose of the treatment or procedure; the risks, benefits, and possible complications of the intervention; the alternatives, including doing nothing; the risk(s) and benefit(s)  of the alternative treatment(s) or procedure(s); and the risk(s) and  benefit(s) of doing nothing. The patient was provided information about the general risks and possible complications associated with the procedure. These may include, but are not limited to: failure to achieve desired goals, infection, bleeding, organ or nerve damage, allergic reactions, paralysis, and death. In addition, the patient was informed of those risks and complications associated to Spine-related procedures, such as failure to decrease pain; infection (i.e.: Meningitis, epidural or intraspinal abscess); bleeding (i.e.: epidural hematoma, subarachnoid hemorrhage, or any other type of intraspinal or peri-dural bleeding); organ or nerve damage (i.e.: Any type of peripheral nerve, nerve root, or spinal cord injury) with subsequent damage to sensory, motor, and/or autonomic systems, resulting in permanent pain, numbness, and/or weakness of one or several areas of the body; allergic reactions; (i.e.: anaphylactic reaction); and/or death. Furthermore, the patient was informed of those risks and complications associated with the medications. These include, but are not limited to: allergic reactions (i.e.: anaphylactic or anaphylactoid reaction(s)); adrenal axis suppression; blood sugar elevation that in diabetics may result in ketoacidosis or comma; water retention that in patients with history of congestive heart failure may result in shortness of breath, pulmonary edema, and decompensation with resultant heart failure; weight gain; swelling or edema; medication-induced neural toxicity; particulate matter embolism and blood vessel occlusion with resultant organ, and/or nervous system infarction; and/or aseptic necrosis of one or more joints. Finally, the patient was informed that Medicine is not an exact science; therefore, there is also the possibility of unforeseen or unpredictable risks and/or possible complications that may result in a  catastrophic outcome. The patient indicated having understood very clearly. We have given the patient no guarantees and we have made no promises. Enough time was given to the patient to ask questions, all of which were answered to the patient's satisfaction. Mr. Labell has indicated that he wanted to continue with the procedure. Attestation: I, the ordering provider, attest that I have discussed with the patient the benefits, risks, side-effects, alternatives, likelihood of achieving goals, and potential problems during recovery for the procedure that I have provided informed consent. Date  Time: 08/18/2020 10:22 AM  Pre-Procedure Preparation:  Monitoring: As per clinic protocol. Respiration, ETCO2, SpO2, BP, heart rate and rhythm monitor placed and checked for adequate function Safety Precautions: Patient was assessed for positional comfort and pressure points before starting the procedure. Time-out: I initiated and conducted the "Time-out" before starting the procedure, as per protocol. The patient was asked to participate by confirming the accuracy of the "Time Out" information. Verification of the correct person, site, and procedure were performed and confirmed by me, the nursing staff, and the patient. "Time-out" conducted as per Joint Commission's Universal Protocol (UP.01.01.01). Time: 1159  Description of Procedure:          Laterality: Bilateral. The procedure was performed in identical fashion on both sides. Levels: T12, L1, L2 Medial Branch Level(s) Area Prepped: Posterior Lumbosacral Region DuraPrep (Iodine Povacrylex [0.7% available iodine] and Isopropyl Alcohol, 74% w/w) Safety Precautions: Aspiration looking for blood return was conducted prior to all injections. At no point did we inject any substances, as a needle was being advanced. Before injecting, the patient was told to immediately notify me if he was experiencing any new onset of "ringing in the ears, or metallic taste in the  mouth". No attempts were made at seeking any paresthesias. Safe injection practices and needle disposal techniques used. Medications properly checked for expiration dates. SDV (single dose vial) medications used. After the completion of the procedure, all disposable equipment used  was discarded in the proper designated medical waste containers. Local Anesthesia: Protocol guidelines were followed. The patient was positioned over the fluoroscopy table. The area was prepped in the usual manner. The time-out was completed. The target area was identified using fluoroscopy. A 12-in long, straight, sterile hemostat was used with fluoroscopic guidance to locate the targets for each level blocked. Once located, the skin was marked with an approved surgical skin marker. Once all sites were marked, the skin (epidermis, dermis, and hypodermis), as well as deeper tissues (fat, connective tissue and muscle) were infiltrated with a small amount of a short-acting local anesthetic, loaded on a 10cc syringe with a 25G, 1.5-in  Needle. An appropriate amount of time was allowed for local anesthetics to take effect before proceeding to the next step. Local Anesthetic: Lidocaine 2.0% The unused portion of the local anesthetic was discarded in the proper designated containers. Technical explanation of process:  T12 Medial Branch Nerve Block (MBB): The target area for the T12 medial branch is at the junction of the postero-lateral aspect of the superior articular process and the superior, posterior, and medial edge of the transverse process of L1. Under fluoroscopic guidance, a Quincke needle was inserted until contact was made with os over the superior postero-lateral aspect of the pedicular shadow (target area). After negative aspiration for blood, 0.5 mL of the nerve block solution was injected without difficulty or complication. The needle was removed intact. L1 Medial Branch Nerve Block (MBB): The target area for the L1 medial  branch is at the junction of the postero-lateral aspect of the superior articular process and the superior, posterior, and medial edge of the transverse process of L2. Under fluoroscopic guidance, a Quincke needle was inserted until contact was made with os over the superior postero-lateral aspect of the pedicular shadow (target area). After negative aspiration for blood, 0.5 mL of the nerve block solution was injected without difficulty or complication. The needle was removed intact. L2 Medial Branch Nerve Block (MBB): The target area for the L2 medial branch is at the junction of the postero-lateral aspect of the superior articular process and the superior, posterior, and medial edge of the transverse process of L3. Under fluoroscopic guidance, a Quincke needle was inserted until contact was made with os over the superior postero-lateral aspect of the pedicular shadow (target area). After negative aspiration for blood, 0.5 mL of the nerve block solution was injected without difficulty or complication. The needle was removed intact.  Nerve block solution: 0.2% PF-Ropivacaine + Triamcinolone (40 mg/mL) diluted to a final concentration of 4 mg of Triamcinolone/mL of Ropivacaine The unused portion of the solution was discarded in the proper designated containers. Procedural Needles: 22-gauge, 3.5-inch, Quincke needles used for all levels.  Once the entire procedure was completed, the treated area was cleaned, making sure to leave some of the prepping solution back to take advantage of its long term bactericidal properties.      Illustration of the posterior view of the lumbar spine and the posterior neural structures. Laminae of L2 through S1 are labeled. DPRL5, dorsal primary ramus of L5; DPRS1, dorsal primary ramus of S1; DPR3, dorsal primary ramus of L3; FJ, facet (zygapophyseal) joint L3-L4; I, inferior articular process of L4; LB1, lateral branch of dorsal primary ramus of L1; IAB, inferior articular  branches from L3 medial branch (supplies L4-L5 facet joint); IBP, intermediate branch plexus; MB3, medial branch of dorsal primary ramus of L3; NR3, third lumbar nerve root; S, superior articular process of L5;  SAB, superior articular branches from L4 (supplies L4-5 facet joint also); TP3, transverse process of L3.  Vitals:   08/18/20 1205 08/18/20 1207 08/18/20 1216 08/18/20 1226  BP: (!) 131/96 (!) 129/94 122/90 (!) 126/59  Pulse:      Resp: 20 17 16 16   Temp:   (!) 97.4 F (36.3 C)   SpO2: 95% 92% 98% 98%  Weight:      Height:         Start Time: 1159 hrs. End Time: 1206 hrs.  Imaging Guidance (Spinal):          Type of Imaging Technique: Fluoroscopy Guidance (Spinal) Indication(s): Assistance in needle guidance and placement for procedures requiring needle placement in or near specific anatomical locations not easily accessible without such assistance. Exposure Time: Please see nurses notes. Contrast: None used. Fluoroscopic Guidance: I was personally present during the use of fluoroscopy. "Tunnel Vision Technique" used to obtain the best possible view of the target area. Parallax error corrected before commencing the procedure. "Direction-depth-direction" technique used to introduce the needle under continuous pulsed fluoroscopy. Once target was reached, antero-posterior, oblique, and lateral fluoroscopic projection used confirm needle placement in all planes. Images permanently stored in EMR. Interpretation: No contrast injected. I personally interpreted the imaging intraoperatively. Adequate needle placement confirmed in multiple planes. Permanent images saved into the patient's record.  Antibiotic Prophylaxis:   Anti-infectives (From admission, onward)    None      Indication(s): None identified  Post-operative Assessment:  Post-procedure Vital Signs:  Pulse/HCG Rate: (!) 10181 Temp: (!) 97.4 F (36.3 C) Resp: 16 BP: (!) 126/59 SpO2: 98 %  EBL:  None  Complications: No immediate post-treatment complications observed by team, or reported by patient.  Note: The patient tolerated the entire procedure well. A repeat set of vitals were taken after the procedure and the patient was kept under observation following institutional policy, for this type of procedure. Post-procedural neurological assessment was performed, showing return to baseline, prior to discharge. The patient was provided with post-procedure discharge instructions, including a section on how to identify potential problems. Should any problems arise concerning this procedure, the patient was given instructions to immediately contact us, at any time, without hesitation. In any case, we plan to contact the patient by telephone for a follow-up status report regarding this interventional procedure.  Comments:  No additional relevant information.  Plan of Care  Orders:  Orders Placed This Encounter  Procedures   LUMBAR FACET(MEDIAL BRANCH NERVE BLOCK) MBNB    Scheduling Instructions:     Procedure: Lumbar facet block (AKA.: Lumbosacral medial branch nerve block)     Side: Bilateral     Level: T12,L1, L2 Medial Branch Nerves)     Sedation: Patient's choice.     Timeframe: Today    Order Specific Question:   Where will this procedure be performed?    Answer:   [facility]   DG [facility] C-ARM 1-60 MIN NO REPORT    Intraoperative interpretation by procedural physician at [place].    Standing Status:   Standing    Number of Occurrences:   1    Order Specific Question:   Reason for exam:    Answer:   Assistance in needle guidance and placement for procedures requiring needle placement in or near specific anatomical locations not easily accessible without such assistance.   Informed Consent Details: Physician/Practitioner Attestation; Transcribe to consent form and obtain patient signature    Nursing Order: Transcribe to consent  form and obtain patient  signature. Note: Always confirm laterality of pain with Mr. Deshler, before procedure.    Order Specific Question:   Physician/Practitioner attestation of informed consent for procedure/surgical case    Answer:   I, the physician/practitioner, attest that I have discussed with the patient the benefits, risks, side effects, alternatives, likelihood of achieving goals and potential problems during recovery for the procedure that I have provided informed consent.    Order Specific Question:   Procedure    Answer:   Lumbar Facet Block  under fluoroscopic guidance    Order Specific Question:   Physician/Practitioner performing the procedure    Answer:   Teryn Gust A. Dossie Arbour MD    Order Specific Question:   Indication/Reason    Answer:   Low Back Pain, with our without leg pain, due to Facet Joint Arthralgia (Joint Pain) Spondylosis (Arthritis of the Spine), without myelopathy or radiculopathy (Nerve Damage).   Care order/instruction: Please confirm that the patient has stopped the Plavix (Clopidogrel) x 7-10 days prior to procedure or surgery.    Please confirm that the patient has stopped the Plavix (Clopidogrel) x 7-10 days prior to procedure or surgery.    Standing Status:   Standing    Number of Occurrences:   1   Provide equipment / supplies at bedside    "Block Tray" (Disposable  single use) Needle type: SpinalSpinal Amount/quantity: 3 Size: Regular (3.5-inch) Gauge: 22G    Standing Status:   Standing    Number of Occurrences:   1    Order Specific Question:   Specify    Answer:   Block Tray   Bleeding precautions    Standing Status:   Standing    Number of Occurrences:   1    Chronic Opioid Analgesic:  None MME/day: 0 mg/day   Medications ordered for procedure: Meds ordered this encounter  Medications   lidocaine (XYLOCAINE) 2 % (with pres) injection 400 mg   lactated ringers infusion 1,000 mL   midazolam (VERSED) 5 MG/5ML injection 1-2 mg    Make sure Flumazenil is available  in the pyxis when using this medication. If oversedation occurs, administer 0.2 mg IV over 15 sec. If after 45 sec no response, administer 0.2 mg again over 1 min; may repeat at 1 min intervals; not to exceed 4 doses (1 mg)   ropivacaine (PF) 2 mg/mL (0.2%) (NAROPIN) injection 18 mL   triamcinolone acetonide (KENALOG-40) injection 80 mg    Medications administered: We administered lidocaine, lactated ringers, midazolam, ropivacaine (PF) 2 mg/mL (0.2%), and triamcinolone acetonide.  See the medical record for exact dosing, route, and time of administration.  Follow-up plan:   Return in about 2 weeks (around 09/01/2020) for procedure day (afternoon VV) (PPE).      Interventional Therapies  Risk  Complexity Considerations:   NOTE: Plavix Anticoagulation (Stop: 7 days  Restart: 2 hours)      Planned  Pending:      Under consideration:   Diagnostic bilateral L1-2 Lumbar facet MBB (T12, L1, L2) #2  Diagnostic intrathecal injection of opioid analgesic as a test for intrathecal pump    Completed:   Diagnostic bilateral L1-2 Lumbar facet MBB (T12, L1, L2) x1 (05/26/2020) (7-0) (100/100/70/70)    Therapeutic  Palliative (PRN) options:   None established     Recent Visits Date Type Provider Dept  07/29/20 Telemedicine Milinda Pointer, MD [facility]  06/11/20 Office Visit Milinda Pointer, MD [facility]  05/26/20 Procedure visit  Milinda Pointer, MD [facility]  Showing recent visits within past 90 days and meeting all other requirements Today's Visits Date Type Provider Dept  08/18/20 Procedure visit Milinda Pointer, MD [facility]  Showing today's visits and meeting all other requirements Future Appointments Date Type Provider Dept  09/02/20 Appointment Milinda Pointer, MD [facility]  Showing future appointments within next 90 days and meeting all other requirements Disposition: Discharge home  Discharge (Date   Time): 08/18/2020;   hrs.   Primary Care Physician: [name], NP Location: [facility] Note by: Gaspar Cola, MD Date: 08/18/2020; Time: 12:34 PM  Disclaimer:  Medicine is not an exact science. The only guarantee in medicine is that nothing is guaranteed. It is important to note that the decision to proceed with this intervention was based on the information collected from the patient. The Data and conclusions were drawn from the patient's questionnaire, the interview, and the physical examination. Because the information was provided in large part by the patient, it cannot be guaranteed that it has not been purposely or unconsciously manipulated. Every effort has been made to obtain as much relevant data as possible for this evaluation. It is important to note that the conclusions that lead to this procedure are derived in large part from the available data. Always take into account that the treatment will also be dependent on availability of resources and existing treatment guidelines, considered by other Pain Management Practitioners as being common knowledge and practice, at the time of the intervention. For Medico-Legal purposes, it is also important to point out that variation in procedural techniques and pharmacological choices are the acceptable norm. The indications, contraindications, technique, and results of the above procedure should only be interpreted and judged by a Board-Certified Interventional Pain Specialist with extensive familiarity and expertise in the same exact procedure and technique.

## 2020-08-19 ENCOUNTER — Telehealth: Payer: Self-pay | Admitting: *Deleted

## 2020-08-19 NOTE — Telephone Encounter (Signed)
Voicemail left, checking on patient post procedure.  Asked to call if there are any questions or concerns.

## 2020-09-01 ENCOUNTER — Encounter: Payer: Self-pay | Admitting: Pain Medicine

## 2020-09-01 NOTE — Progress Notes (Signed)
Patient: Keith Carter  Service Category: E/M  Provider: Gaspar Cola, MD  DOB: 03-18-1955  DOS: 09/02/2020  Location: Office  MRN: [id number]  Setting: Ambulatory outpatient  Referring Provider: [name], *  Type: Established Patient  Specialty: Interventional Pain Management  PCP: [name], NP  Location: Remote location  Delivery: TeleHealth     Virtual Encounter - Pain Management PROVIDER NOTE: Information contained herein reflects review and annotations entered in association with encounter. Interpretation of such information and data should be left to medically-trained personnel. Information provided to patient can be located elsewhere in the medical record under "Patient Instructions". Document created using STT-dictation technology, any transcriptional errors that may result from process are unintentional.    Contact & Pharmacy Preferred: [phone] Home: [phone] (home) Mobile: There is no such number on file (mobile). E-mail: [email or web address]  [place] [place] [facility] [address] [place] [address] Phone: [phone] Fax: [phone]   Pre-screening  Keith Carter offered "in-person" vs "virtual" encounter. He indicated preferring virtual for this encounter.   Reason COVID-19*  Social distancing based on CDC and AMA recommendations.   I contacted Keith Carter on 09/02/2020 via telephone.      I clearly identified myself as Gaspar Cola, MD. I verified that I was speaking with the correct person using two identifiers (Name: Keith Carter, and date of birth: May 30, 1955).  Consent I sought verbal advanced consent from Keith Carter for virtual visit interactions. I informed Keith Carter of possible security and privacy concerns, risks, and limitations associated with providing "not-in-person" medical evaluation and management services. I also informed Keith Carter of the availability of "in-person"  appointments. Finally, I informed him that there would be a charge for the virtual visit and that he could be  personally, fully or partially, financially responsible for it. Keith Carter expressed understanding and agreed to proceed.   Historic Elements   Keith Carter is a 65 y.o. year old, male patient evaluated today after our last contact on 08/18/2020. Keith Carter  has a past medical history of Anxiety, Dyslipidemia, Enlarged prostate, Hypertension, Prediabetes, and TIA (transient ischemic attack). He also  has a past surgical history that includes Back surgery and Hernia repair. Keith Carter has a current medication list which includes the following prescription(s): acetaminophen, amlodipine, atorvastatin, cholecalciferol, clopidogrel, lisinopril, metformin, paroxetine, potassium, sildenafil, tamsulosin, and celecoxib. He  reports that he has never smoked. He has never used smokeless tobacco. He reports current alcohol use. He reports that he does not use drugs. Keith Carter has No Known Allergies.   HPI  Today, he is being contacted for a post-procedure assessment.  The patient refers again having attained 100% relief of his back pain from the bilateral L1-2 facet block.  He has extensive hardware in his lumbar spine and therefore is not a good candidate for radiofrequency ablation.  However, as long as the facet blocks continue to providing some benefit we will continue to use him as a palliative treatment for his condition.  At this point he indicates not needing anything else for now.  Post-Procedure Evaluation  Procedure (08/18/2020):  Type: Lumbar Facet, Medial Branch Block(s)          Primary Purpose: Diagnostic Region: Posterolateral Lumbosacral Spine Level: T12, L1, L2 Medial Branch Level(s). Laterality: Bilateral  Pre-procedure pain level: 8/10 Post-procedure: 0/10 (100% relief)  Anxiolysis: Minimal conscious anxiolysis  Effectiveness during initial hour after procedure (Ultra-Short  Term  Relief): 100 %.  Local anesthetic used: Long-acting (4-6 hours) Effectiveness: Defined as any analgesic benefit obtained secondary to the administration of local anesthetics. This carries significant diagnostic value as to the etiological location, or anatomical origin, of the pain. Duration of benefit is expected to coincide with the duration of the local anesthetic used.  Effectiveness during initial 4-6 hours after procedure (Short-Term Relief): 100 %.  Long-term benefit: Defined as any relief past the pharmacologic duration of the local anesthetics.  Effectiveness past the initial 6 hours after procedure (Long-Term Relief): 70 %.  Benefits, current: Defined as benefit present at the time of this evaluation.   Analgesia:   The patient indicates having attained an ongoing 70% relief of his low back pain with the bilateral L1-2 facet block ( MBB T12, L1, and L2). Function: Keith Carter reports improvement in function ROM: Keith Carter reports improvement in ROM  Pharmacotherapy Assessment   Analgesic: No chronic opioid analgesics therapy prescribed by our practice. None MME/day: 0 mg/day   Monitoring: [place] PMP: PDMP reviewed during this encounter.       Pharmacotherapy: No side-effects or adverse reactions reported. Compliance: No problems identified. Effectiveness: Clinically acceptable. Plan: Refer to "POC". UDS:  Summary  Date Value Ref Range Status  03/16/2020 FINAL  Final    Comment:    ==================================================================== TOX[facility]RE COMP DRUG ANALYSIS,UR ==================================================================== Test                             Result       Flag       Units Drug Present and Declared for Prescription Verification   Paroxetine                     PRESENT      EXPECTED  Drug Present not Declared for Prescription Verification   Ibuprofen                      PRESENT      UNEXPECTED  Drug Absent but Declared for Prescription  Verification   Acetaminophen                  Not Detected UNEXPECTED    Acetaminophen, as indicated in the declared medication list, is    not always detected even when used as directed.    Salicylate                     Not Detected UNEXPECTED    Aspirin, as indicated in the declared medication list, is not    always detected even when used as directed.  ==================================================================== Test                      Result    Flag   Units      Ref Range   Creatinine              300              mg/dL      >=20 ==================================================================== Declared Medications:  The flagging and interpretation on this report are based on the  following declared medications.  Unexpected results may arise from  inaccuracies in the declared medications.   **Note: The testing scope of this panel includes these medications:   Paroxetine (Paxil)   **Note: The testing scope of this panel does not include small to  moderate amounts  of these reported medications:   Acetaminophen (Tylenol)  Aspirin (Aspirin 81)   **Note: The testing scope of this panel does not include following  reported medications:   Amlodipine (Norvasc)  Atorvastatin (Lipitor)  Celecoxib (Celebrex)  Cholecalciferol  Clopidogrel (Plavix)  Lisinopril (Zestril)  Metformin  Potassium  Sildenafil (Revatio)  Tamsulosin (Flomax) ==================================================================== For clinical consultation, please call [phone]. ====================================================================      Laboratory Chemistry Profile   Renal Lab Results  Component Value Date   BUN 19 03/16/2020   CREATININE 1.13 03/16/2020   BCR 17 03/16/2020   GFRAA 78 03/16/2020   GFRNONAA 67 03/16/2020    Hepatic Lab Results  Component Value Date   AST 14 03/16/2020   ALT 26 10/02/2019   ALBUMIN 3.8 03/16/2020   ALKPHOS 97 03/16/2020     Electrolytes Lab Results  Component Value Date   NA 138 03/16/2020   K 5.4 (H) 03/16/2020   CL 100 03/16/2020   CALCIUM 9.5 03/16/2020   MG 2.0 03/16/2020   PHOS 3.4 10/02/2019    Bone Lab Results  Component Value Date   25OHVITD1 28 (L) 03/16/2020   25OHVITD2 <1.0 03/16/2020   25OHVITD3 28 03/16/2020    Inflammation (CRP: Acute Phase) (ESR: Chronic Phase) Lab Results  Component Value Date   CRP 1 03/16/2020   ESRSEDRATE 8 03/16/2020         Note: Above Lab results reviewed.  Imaging  DG PAIN CLINIC C-ARM 1-60 MIN NO REPORT Fluoro was used, but no Radiologist interpretation will be provided.  Please refer to "NOTES" tab for provider progress note.  Assessment  The primary encounter diagnosis was Lumbar facet joint syndrome (Bilateral). Diagnoses of Lumbar Grade 1 Anterolisthesis of L1 over L2 (2 mm anterior translation with flexion), Lumbar Grade 1 Retrolisthesis of L2/L3 and L3/L4, Lumbosacral facet hypertrophy (Multilevel) (Bilateral), Osteoarthritis of facet joint of lumbar spine, DDD (degenerative disc disease), lumbosacral, and Chronic low back pain (1ry area of Pain) (Bilateral) w/o sciatica were also pertinent to this visit.  Plan of Care  Problem-specific:  No problem-specific Assessment & Plan notes found for this encounter.  Mr. Sheddrick Lattanzio [facility] has a current medication list which includes the following long-term medication(s): amlodipine, atorvastatin, lisinopril, metformin, paroxetine, and potassium.  Pharmacotherapy (Medications Ordered): No orders of the defined types were placed in this encounter.  Orders:  No orders of the defined types were placed in this encounter.  Follow-up plan:   No follow-ups on file.     Interventional Therapies  Risk  Complexity Considerations:   NOTE: Plavix Anticoagulation (Stop: 7 days  Restart: 2 hours)      Planned  Pending:      Under consideration:   Diagnostic bilateral L1-2 Lumbar facet MBB (T12, L1, L2)  #2  Diagnostic intrathecal injection of opioid analgesic as a test for intrathecal pump    Completed:   Diagnostic bilateral L1-2 Lumbar facet MBB (T12, L1, L2) x1 (05/26/2020) (7-0) (100/100/70/70)    Therapeutic  Palliative (PRN) options:   None established      Recent Visits Date Type Provider Dept  08/18/20 Procedure visit Milinda Pointer, MD [facility]  07/29/20 Telemedicine Milinda Pointer, MD [facility]  06/11/20 Office Visit Milinda Pointer, MD [facility]  Showing recent visits within past 90 days and meeting all other requirements Future Appointments Date Type Provider Dept  09/02/20 Telemedicine Milinda Pointer, MD [facility]  Showing future appointments within next 90 days and meeting all other  requirements I discussed the assessment and treatment plan with the patient. The patient was provided an opportunity to ask questions and all were answered. The patient agreed with the plan and demonstrated an understanding of the instructions.  Patient advised to call back or seek an in-person evaluation if the symptoms or condition worsens.  Duration of encounter: 12 minutes.  Note by: Gaspar Cola, MD Date: 09/02/2020; Time: 5:28 PM

## 2020-09-02 ENCOUNTER — Other Ambulatory Visit: Payer: Self-pay

## 2020-09-02 ENCOUNTER — Ambulatory Visit: Payer: Medicare HMO | Attending: Pain Medicine | Admitting: Pain Medicine

## 2020-09-02 DIAGNOSIS — M545 Low back pain, unspecified: Secondary | ICD-10-CM

## 2020-09-02 DIAGNOSIS — G8929 Other chronic pain: Secondary | ICD-10-CM

## 2020-09-02 DIAGNOSIS — M47816 Spondylosis without myelopathy or radiculopathy, lumbar region: Secondary | ICD-10-CM

## 2020-09-02 DIAGNOSIS — M4316 Spondylolisthesis, lumbar region: Secondary | ICD-10-CM | POA: Diagnosis not present

## 2020-09-02 DIAGNOSIS — M4310 Spondylolisthesis, site unspecified: Secondary | ICD-10-CM

## 2020-09-02 DIAGNOSIS — M5137 Other intervertebral disc degeneration, lumbosacral region: Secondary | ICD-10-CM

## 2020-09-02 DIAGNOSIS — M47817 Spondylosis without myelopathy or radiculopathy, lumbosacral region: Secondary | ICD-10-CM

## 2020-09-30 ENCOUNTER — Other Ambulatory Visit: Payer: Self-pay

## 2020-09-30 ENCOUNTER — Encounter: Payer: Self-pay | Admitting: Emergency Medicine

## 2020-09-30 DIAGNOSIS — Z20822 Contact with and (suspected) exposure to covid-19: Secondary | ICD-10-CM | POA: Diagnosis not present

## 2020-09-30 DIAGNOSIS — R531 Weakness: Secondary | ICD-10-CM | POA: Diagnosis not present

## 2020-09-30 DIAGNOSIS — Z7984 Long term (current) use of oral hypoglycemic drugs: Secondary | ICD-10-CM | POA: Diagnosis not present

## 2020-09-30 DIAGNOSIS — E1169 Type 2 diabetes mellitus with other specified complication: Secondary | ICD-10-CM | POA: Diagnosis not present

## 2020-09-30 DIAGNOSIS — Z79899 Other long term (current) drug therapy: Secondary | ICD-10-CM | POA: Diagnosis not present

## 2020-09-30 DIAGNOSIS — I10 Essential (primary) hypertension: Secondary | ICD-10-CM | POA: Insufficient documentation

## 2020-09-30 LAB — BASIC METABOLIC PANEL
Anion gap: 7 (ref 5–15)
BUN: 22 mg/dL (ref 8–23)
CO2: 25 mmol/L (ref 22–32)
Calcium: 9.2 mg/dL (ref 8.9–10.3)
Chloride: 105 mmol/L (ref 98–111)
Creatinine, Ser: 1.49 mg/dL — ABNORMAL HIGH (ref 0.61–1.24)
GFR, Estimated: 51 mL/min — ABNORMAL LOW (ref >60)
Glucose, Bld: 300 mg/dL — ABNORMAL HIGH (ref 70–99)
Potassium: 4.1 mmol/L (ref 3.5–5.1)
Sodium: 137 mmol/L (ref 135–145)

## 2020-09-30 LAB — CBG MONITORING, ED: Glucose-Capillary: 270 mg/dL — ABNORMAL HIGH (ref 70–99)

## 2020-09-30 LAB — CBC
HCT: 42.8 % (ref 39.0–52.0)
Hemoglobin: 14.6 g/dL (ref 13.0–17.0)
MCH: 30.5 pg (ref 26.0–34.0)
MCHC: 34.1 g/dL (ref 30.0–36.0)
MCV: 89.4 fL (ref 80.0–100.0)
Platelets: 193 10*3/uL (ref 150–400)
RBC: 4.79 MIL/uL (ref 4.22–5.81)
RDW: 13.5 % (ref 11.5–15.5)
WBC: 9.2 10*3/uL (ref 4.0–10.5)
nRBC: 0 % (ref 0.0–0.2)

## 2020-09-30 NOTE — ED Triage Notes (Signed)
Patient in via [facility] from home; reports generalized weakness x 1 days.  Patient normally able to walk with walker but having difficulty doing so today. Reports 2 falls today due to legs giving out.  A/Ox3, NAD noted at this time.

## 2020-10-01 ENCOUNTER — Emergency Department
Admission: EM | Admit: 2020-10-01 | Discharge: 2020-10-01 | Disposition: A | Discharge status: Home / Self Care | Payer: Medicare HMO | Attending: Emergency Medicine | Admitting: Emergency Medicine

## 2020-10-01 ENCOUNTER — Emergency Department: Payer: Medicare HMO

## 2020-10-01 DIAGNOSIS — R531 Weakness: Secondary | ICD-10-CM

## 2020-10-01 LAB — URINALYSIS, COMPLETE (UACMP) WITH MICROSCOPIC
Bacteria, UA: NONE SEEN
Bilirubin Urine: NEGATIVE
Glucose, UA: >=500 mg/dL — AB
Hgb urine dipstick: NEGATIVE
Ketones, ur: 5 mg/dL — AB
Leukocytes,Ua: NEGATIVE
Nitrite: NEGATIVE
Protein, ur: 30 mg/dL — AB
Specific Gravity, Urine: 1.031 — ABNORMAL HIGH (ref 1.005–1.030)
pH: 5 (ref 5.0–8.0)

## 2020-10-01 LAB — RESP PANEL BY RT-PCR (FLU A&B, COVID) ARPGX2
Influenza A by PCR: NEGATIVE
Influenza B by PCR: NEGATIVE
SARS Coronavirus 2 by RT PCR: NEGATIVE

## 2020-10-01 MED ORDER — SODIUM CHLORIDE 0.9 % IV BOLUS
500.0000 mL | Freq: Once | INTRAVENOUS | Status: AC
  Administered 2020-10-01: 500 mL via INTRAVENOUS

## 2020-10-01 NOTE — ED Notes (Signed)
Pt. Speaking with his daughter on telephone.

## 2020-10-01 NOTE — ED Notes (Signed)
Pt. Returned from MRI, sheets and pants soaked in urine. Pt. Cleaned up, linens and clothes changed, primo external catheter applied. Fresh warm blankets applied. Pt. Denies any further need at this time. NAD.

## 2020-10-01 NOTE — ED Notes (Signed)
Pt attempted to sign but pad not working

## 2020-10-01 NOTE — ED Notes (Signed)
Pt in MRI.

## 2020-10-01 NOTE — ED Notes (Signed)
Pt. To MRI

## 2020-10-01 NOTE — ED Notes (Signed)
Attempted to contact pt's daughter [name]

## 2020-10-01 NOTE — ED Notes (Signed)
Per MD request, pt. Ambulated in room with two nurse assist. PT. States he normally uses a walker at home, and his staggered step is his baseline. MD notified.

## 2020-10-01 NOTE — Discharge Instructions (Addendum)
Your workup in the Emergency Department today was reassuring.  We did not find any specific abnormalities.  We recommend you drink plenty of fluids, take your regular medications and/or any new ones prescribed today, and follow up with the doctor(s) listed in these documents as recommended.  Return to the Emergency Department if you develop new or worsening symptoms that concern you.  

## 2020-10-01 NOTE — ED Provider Notes (Signed)
[facility] Emergency Department Provider Note  ____________________________________________   Event Date/Time   First MD Initiated Contact with Patient 10/01/20 0100     (approximate)  I have reviewed the triage vital signs and the nursing notes.   HISTORY  Chief Complaint Weakness  Level 5 caveat: Patient has history is somewhat limited by being a vague historian and possibly due to acute illness.  HPI Keith Carter is a 65 y.o. male with extensive medical history as listed below which notably includes prior lumbar spine issues and surgery as well as possible prior TIA versus CVA.  He presents tonight for at least 2 days of generalized weakness.  He also said that his legs are weaker than usual and he came in tonight after he could not stand in the shower.  He does not think he has been slurring his speech or having trouble finding words.  He has no weakness in his arms and his weakness seems to be all over his whole body but especially in both of his legs.  He denies fever, sore throat, chest pain, shortness of breath, nausea, vomiting, abdominal pain.  He is not sure whether he has been having trouble with urination including retention or increased urinary frequency.  He has no pain when he urinates.  Overall he describes the symptoms as severe and nothing in particular seems to make it better or worse.     Past Medical History:  Diagnosis Date   Anxiety    Dyslipidemia    Enlarged prostate    Hypertension    Prediabetes    TIA (transient ischemic attack)     Patient Active Problem List   Diagnosis Date Noted   Spondylosis without myelopathy or radiculopathy, lumbosacral region 08/18/2020   DDD (degenerative disc disease), lumbosacral 07/28/2020   Lumbar Grade 1 Anterolisthesis of L1 over L2 (2 mm anterior translation with flexion) 04/22/2020   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) 04/22/2020   Abnormal MRI, lumbar  spine (01/10/2017) 03/16/2020   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   Lumbosacral facet hypertrophy (Multilevel) (Bilateral) 03/16/2020   Lumbar facet joint syndrome (Bilateral) 03/16/2020   Osteoarthritis of facet joint of lumbar spine 03/16/2020   Lumbar lateral recess stenosis (Multilevel) (Bilateral) 03/16/2020   Lumbosacral foraminal stenosis (Multilevel) (Bilateral) 03/16/2020   Lumbar central spinal stenosis, 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) 03/16/2020   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   Dextroscoliosis of lumbar spine 03/16/2020   Lumbar Grade 1 Retrolisthesis of L2/L3 and L3/L4 03/16/2020   Abnormal CT scan, lumbar spine (01/27/2020) 03/16/2020   Elevated hemoglobin A1c 03/16/2020   Chronic anticoagulation (PLAVIX) 03/16/2020   Anxiety 03/15/2020   Arthritis 03/15/2020   Erectile dysfunction 03/15/2020   Hypertension 03/15/2020   Chronic pain syndrome 03/15/2020   Pharmacologic therapy 03/15/2020   Disorder of skeletal system 03/15/2020   Problems influencing health status 03/15/2020   Expressive aphasia 10/08/2019   TIA (transient ischemic attack) 09/29/2019   Aphasia 09/28/2019   History of stroke 05/09/2019   Acute CVA (cerebrovascular accident) ([place]) 04/05/2019   Dysarthria    Type 2 diabetes mellitus with hyperlipidemia (HCC)    Essential hypertension    Obesity (BMI 30-39.9)    Benign prostatic hyperplasia without lower urinary tract symptoms    Stroke ([place]) 04/04/2019   Controlled type 2 diabetes mellitus without complication, without long-term current use of insulin ([place]) 09/06/2017   Pain of both elbows 04/14/2015   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 Authorizing  Provider  acetaminophen (TYLENOL) 500 MG tablet Take 500 mg by mouth every 6 (six) hours as needed.    Yes [provider]  amLODipine (NORVASC) 5 MG tablet Take 1 tablet (5 mg total) by mouth daily. 10/02/19  Yes Amin, Jeanella Flattery, MD  atorvastatin (LIPITOR) 40 MG tablet Take 1 tablet (40 mg total) by mouth daily at 6 PM. 04/05/19  Yes Wieting, Richard, MD  Cholecalciferol 25 MCG (1000 UT) tablet Take 5,000 Units by mouth daily.    Yes [provider]  clopidogrel (PLAVIX) 75 MG tablet Take 75 mg by mouth daily. 10/11/19  Yes [provider]  lisinopril (ZESTRIL) 40 MG tablet Take 40 mg by mouth at bedtime.  03/10/19  Yes [provider]  metFORMIN (GLUCOPHAGE-XR) 500 MG 24 hr tablet Take 500 mg by mouth 2 (two) times daily. 03/10/19  Yes [provider]  PARoxetine (PAXIL) 40 MG tablet Take 40 mg by mouth at bedtime.  02/21/19  Yes [provider]  Potassium 99 MG TABS Take by mouth daily.   Yes [provider]  sildenafil (REVATIO) 20 MG tablet Take 20 mg by mouth daily as needed. 12/16/18  Yes [provider]  tamsulosin (FLOMAX) 0.4 MG CAPS capsule Take 0.8 mg by mouth at bedtime.  02/26/19  Yes [provider]    Allergies Patient has no known allergies.  Family History  Problem Relation Age of Onset   Stroke Neg Hx     Social History Social History   Tobacco Use   Smoking status: Never   Smokeless tobacco: Never  Vaping Use   Vaping Use: Never used  Substance Use Topics   Alcohol use: Yes   Drug use: Never    Review of Systems Level 5 caveat: Patient has history is somewhat limited by being a vague historian and possibly due to acute illness.  Constitutional: No fever/chills.  Positive for generalized weakness. Eyes: No visual changes. ENT: No sore throat. Cardiovascular: Denies chest pain. Respiratory: Denies shortness of breath. Gastrointestinal: No abdominal pain.  No nausea, no vomiting.  No  diarrhea.  No constipation. Genitourinary: Negative for dysuria. Musculoskeletal: Chronic lower back pain. Integumentary: Negative for rash. Neurological: Worsening weakness in both legs, no weakness in his arms.  No sensation changes.   ____________________________________________   PHYSICAL EXAM:  VITAL SIGNS: ED Triage Vitals  Enc Vitals Group     BP 09/30/20 2143 140/89     Pulse Rate 09/30/20 2143 89     Resp 09/30/20 2143 18     Temp 09/30/20 2143 99.1 F (37.3 C)     Temp Source 09/30/20 2143 Oral     SpO2 09/30/20 2143 95 %     Weight 09/30/20 2147 117.9 kg (260 lb)     Height 09/30/20 2147 1.854 m ('6\' 1"'$ )     Head Circumference --      Peak Flow --      Pain Score 09/30/20 2147 0     Pain Loc --      Pain Edu? --      Excl. in [place]? --     Constitutional: Alert and oriented.  Eyes: Conjunctivae are normal.  Head: Atraumatic. Nose: No congestion/rhinnorhea. Mouth/Throat: Patient is wearing a mask. Neck: No stridor.  No meningeal signs.   Cardiovascular: Normal rate, regular rhythm. Good peripheral circulation. Respiratory: Normal respiratory effort.  No retractions. Gastrointestinal: Soft and nontender. No distention.  Musculoskeletal: No lower extremity tenderness nor edema. No gross deformities of extremities. Neurologic: The patient speech is somewhat hesitant and at times he seems to either stutter or have trouble finding words.  His speech also seems to be a little bit slurred.  Unclear if this is baseline.  He has normal and equal strength in bilateral upper extremities.  He claims that his lower extremities feel weak but he is able to raise them against gravity of off the bed.  Gait was not tested at this time. Skin:  Skin is warm, dry and intact. Psychiatric: Mood and affect are a little bit flat but without any specific psychiatric warning signs.  ____________________________________________   LABS (all labs ordered are listed, but only abnormal results  are displayed)  Labs Reviewed  BASIC METABOLIC PANEL - Abnormal; Notable for the following components:      Result Value   Glucose, Bld 300 (*)    Creatinine, Ser 1.49 (*)    GFR, Estimated 51 (*)    All other components within normal limits  URINALYSIS, COMPLETE (UACMP) WITH MICROSCOPIC - Abnormal; Notable for the following components:   Color, Urine YELLOW (*)    APPearance CLEAR (*)    Specific Gravity, Urine 1.031 (*)    Glucose, UA >=500 (*)    Ketones, ur 5 (*)    Protein, ur 30 (*)    All other components within normal limits  CBG MONITORING, ED - Abnormal; Notable for the following components:   Glucose-Capillary 270 (*)    All other components within normal limits  RESP PANEL BY RT-PCR (FLU A&B, COVID) [id number]  CBC   ____________________________________________  EKG  ED ECG REPORT I, Hinda Kehr, the attending physician, personally viewed and interpreted this ECG.  Date: 09/30/2020 EKG Time: 21: 45 Rate: 89 Rhythm: normal sinus rhythm QRS Axis: normal Intervals: Right bundle branch block and left anterior fascicular block ST/T Wave abnormalities: Non-specific ST segment / T-wave changes, but no clear evidence of acute ischemia. Narrative Interpretation: no definitive evidence of acute ischemia; does not meet STEMI criteria.  ____________________________________________  RADIOLOGY I, Hinda Kehr, personally viewed and evaluated these images (plain radiographs) as part of my medical decision making, as well as reviewing the written report by the radiologist.  ED MD interpretation: No acute abnormalities identified on MRI of the brain nor lumbar spine  Official radiology report(s): MR BRAIN WO CONTRAST  Result Date: 10/01/2020 CLINICAL DATA:  Transient ischemic attack EXAM: MRI HEAD WITHOUT CONTRAST TECHNIQUE: Multiplanar, multiecho pulse sequences of the brain and surrounding structures were obtained without intravenous contrast. COMPARISON:  None. FINDINGS:  Brain: No acute infarct, mass effect or extra-axial collection. Multiple chronic microhemorrhages in a predominantly central distribution. Hyperintense T2-weighted signal is moderately widespread throughout the white matter. Advanced atrophy for age. The midline structures are normal. Vascular: Major flow voids are preserved. Skull and upper cervical spine: Normal calvarium and skull base. Visualized upper cervical spine and soft tissues are normal. Sinuses/Orbits:No paranasal sinus fluid levels or advanced mucosal thickening. No mastoid or middle ear effusion. Normal orbits. IMPRESSION: 1. No acute intracranial abnormality. 2. Advanced atrophy and findings of chronic microvascular disease. Electronically Signed   By: Ulyses Jarred M.D.   On:  10/01/2020 03:49   MR LUMBAR SPINE WO CONTRAST  Result Date: 10/01/2020 CLINICAL DATA:  Recent fall EXAM: MRI LUMBAR SPINE WITHOUT CONTRAST TECHNIQUE: Multiplanar, multisequence MR imaging of the lumbar spine was performed. No intravenous contrast was administered. COMPARISON:  01/10/2017 FINDINGS: Segmentation:  Standard. Alignment:  Grade 1 retrolisthesis at L2-3 Vertebrae:  L2-S1 PLIF.  Interbody spacer at L1-2. Conus medullaris and cauda equina: Conus extends to the L1 level. Conus and cauda equina appear normal. Paraspinal and other soft tissues: Negative. Disc levels: T12-L1: Unremarkable. L1-L2: Small disc bulge, progressed. Interbody spacer. Small amount of fluid in the left facet joint. No spinal canal stenosis. Progression of severe left neural foraminal stenosis. L2-L3: PLIF. Left subarticular disc protrusion is unchanged. Left lateral recess narrowing without central spinal canal stenosis. Unchanged moderate left neural foraminal stenosis. L3-L4: PLIF. No residual disc herniation. Bilateral endplate spurring. No spinal canal stenosis. Improvement of moderate left neural foraminal stenosis. Unchanged moderate right neural foraminal stenosis. L4-L5: Postsurgical  changes without residual disc herniation. No spinal canal stenosis. Slightly improved moderate right neural foraminal stenosis. L5-S1: Postsurgical changes without residual disc herniation. No spinal canal stenosis. Unchanged moderate bilateral neural foraminal stenosis. Visualized sacrum: Normal. IMPRESSION: 1. No acute abnormality of the lumbar spine. 2. L2-S1 PLIF without spinal canal stenosis. 3. Progression of severe left L1-2 neural foraminal stenosis. 4. Improvement of moderate left L3-4 and right L4-5 neural foraminal stenosis. 5. Unchanged moderate left L2-3 and bilateral L5-S1 neural foraminal stenosis. Electronically Signed   By: Ulyses Jarred M.D.   On: 10/01/2020 04:01    ____________________________________________   PROCEDURES   Procedure(s) performed (including Critical Care):  .1-3 Lead EKG Interpretation  Date/Time: 10/01/2020 5:53 AM Performed by: Hinda Kehr, MD Authorized by: Hinda Kehr, MD     Interpretation: normal     ECG rate:  85   ECG rate assessment: normal     Rhythm: sinus rhythm     Ectopy: none     Conduction: normal     ____________________________________________   INITIAL IMPRESSION / MDM / ASSESSMENT AND PLAN / ED COURSE  As part of my medical decision making, I reviewed the following data within the [place] notes reviewed and incorporated, Labs reviewed , EKG interpreted , Old chart reviewed, Radiograph reviewed , and Notes from prior ED visits   Differential diagnosis includes, but is not limited to, deconditioning, viral illness, electrolyte or metabolic abnormality, acute infection, CVA/TIA.  The patient is on the cardiac monitor to evaluate for evidence of arrhythmia and/or significant heart rate changes.   Given the patient's somewhat vague history and questionable word finding difficulties, I am going to proceed with an MRI of the brain as well as of his lumbar spine given his chronic back problems.  However  his physical exam is generally reassuring, including his neuro exam, with no obvious focal neurological deficits.  He seems tired but without evidence of acute or emergent illness.  His vital signs are generally reassuring other than hypertension which appears to be chronic.  His oxygen saturation is normal and he has no respiratory symptoms.  He is having no chest pain.  No ischemia on EKG.  Urinalysis is not indicative of infection.  Very slight elevation of his creatinine on basic metabolic panel but not consistent with acute renal failure.  CBC is normal with no leukocytosis and a normal hemoglobin.  COVID test is pending, MRIs are pending.  I will reassess after additional evaluation.  Clinical Course as of 10/01/20 0557  Thu Oct 01, 2020  [id number] MRIs are unremarkable.  He has some advanced microvascular changes for his age on the MRI of the brain, but no evidence of acute abnormality.  His lumbar spine shows chronic issues but without any acute changes or indication of surgical emergency.  The patient reports that he ambulates with a walker at home.  With ED staff assistance he was able to ambulate somewhat unsteadily but he does not have a walker here.  He is able to support his weight and appears to be more or less at his baseline.  At this point there is no indication of an acute or emergent condition.  He may be suffering from a viral infection and we are awaiting the results of the respiratory viral panel.  His lab results are all generally reassuring; there is no indication of urinary tract infection, his CBC is normal, and his basic metabolic panel is normal other than a very slight elevation of his creatinine.  I will provide 500 mL of normal saline bolus in case he is slightly volume depleted which will also help with his hyperglycemia, but at this point there is no indication that he requires admission to the hospital. [CF]  0550 SARS Coronavirus 2 by RT PCR: NEGATIVE [CF]  0550 I  discussed the reassuring findings with the patient and he agreed that he would like to go home after the IV fluids since there is no specific issue identified.  He is comfortable doing so and he has a caregiver to his wife so he wants to get home.  I encouraged close outpatient follow-up with his PCP and I gave my usual and customary return precautions. [CF]    Clinical Course User Index [CF] Hinda Kehr, MD     ____________________________________________  FINAL CLINICAL IMPRESSION(S) / ED DIAGNOSES  Final diagnoses:  Generalized weakness     MEDICATIONS GIVEN DURING THIS VISIT:  Medications  sodium chloride 0.9 % bolus 500 mL (has no administration in time range)     ED Discharge Orders     None        Note:  This document was prepared using Dragon voice recognition software and may include unintentional dictation errors.   Hinda Kehr, MD 10/01/20 [phone]

## 2020-10-01 NOTE — ED Notes (Signed)
Pt and daughter verbalized understanding discharge instructions. In no acute distress.

## 2020-10-01 NOTE — ED Notes (Signed)
Spoke with pt's daughter [name] on telephone. Updated on POC, and discharge pending. [name] will come to pick pt. Up, will keep pt. In ED room until she arrives for pt. Safety.

## 2020-10-04 ENCOUNTER — Other Ambulatory Visit: Payer: Self-pay

## 2020-10-04 ENCOUNTER — Emergency Department: Payer: Medicare HMO

## 2020-10-04 ENCOUNTER — Inpatient Hospital Stay
Admission: EM | Admit: 2020-10-04 | Discharge: 2020-10-12 | DRG: 164 | Disposition: A | Discharge status: Skilled Nursing Facility | Payer: Medicare HMO | Attending: Internal Medicine | Admitting: Internal Medicine

## 2020-10-04 DIAGNOSIS — Z7901 Long term (current) use of anticoagulants: Secondary | ICD-10-CM

## 2020-10-04 DIAGNOSIS — Z8249 Family history of ischemic heart disease and other diseases of the circulatory system: Secondary | ICD-10-CM

## 2020-10-04 DIAGNOSIS — I2692 Saddle embolus of pulmonary artery without acute cor pulmonale: Secondary | ICD-10-CM

## 2020-10-04 DIAGNOSIS — F419 Anxiety disorder, unspecified: Secondary | ICD-10-CM | POA: Diagnosis present

## 2020-10-04 DIAGNOSIS — E1165 Type 2 diabetes mellitus with hyperglycemia: Secondary | ICD-10-CM | POA: Diagnosis present

## 2020-10-04 DIAGNOSIS — F0280 Dementia in other diseases classified elsewhere without behavioral disturbance: Secondary | ICD-10-CM | POA: Diagnosis present

## 2020-10-04 DIAGNOSIS — Z6834 Body mass index (BMI) 34.0-34.9, adult: Secondary | ICD-10-CM

## 2020-10-04 DIAGNOSIS — I2699 Other pulmonary embolism without acute cor pulmonale: Secondary | ICD-10-CM | POA: Diagnosis not present

## 2020-10-04 DIAGNOSIS — I69322 Dysarthria following cerebral infarction: Secondary | ICD-10-CM

## 2020-10-04 DIAGNOSIS — E785 Hyperlipidemia, unspecified: Secondary | ICD-10-CM | POA: Diagnosis present

## 2020-10-04 DIAGNOSIS — I2602 Saddle embolus of pulmonary artery with acute cor pulmonale: Secondary | ICD-10-CM | POA: Diagnosis not present

## 2020-10-04 DIAGNOSIS — R531 Weakness: Secondary | ICD-10-CM

## 2020-10-04 DIAGNOSIS — Z7984 Long term (current) use of oral hypoglycemic drugs: Secondary | ICD-10-CM

## 2020-10-04 DIAGNOSIS — Z66 Do not resuscitate: Secondary | ICD-10-CM | POA: Diagnosis present

## 2020-10-04 DIAGNOSIS — R262 Difficulty in walking, not elsewhere classified: Secondary | ICD-10-CM

## 2020-10-04 DIAGNOSIS — I69392 Facial weakness following cerebral infarction: Secondary | ICD-10-CM

## 2020-10-04 DIAGNOSIS — R630 Anorexia: Secondary | ICD-10-CM | POA: Diagnosis present

## 2020-10-04 DIAGNOSIS — Z823 Family history of stroke: Secondary | ICD-10-CM

## 2020-10-04 DIAGNOSIS — F05 Delirium due to known physiological condition: Secondary | ICD-10-CM | POA: Diagnosis present

## 2020-10-04 DIAGNOSIS — N400 Enlarged prostate without lower urinary tract symptoms: Secondary | ICD-10-CM | POA: Diagnosis present

## 2020-10-04 DIAGNOSIS — K439 Ventral hernia without obstruction or gangrene: Secondary | ICD-10-CM | POA: Diagnosis present

## 2020-10-04 DIAGNOSIS — I2510 Atherosclerotic heart disease of native coronary artery without angina pectoris: Secondary | ICD-10-CM | POA: Diagnosis present

## 2020-10-04 DIAGNOSIS — Z8673 Personal history of transient ischemic attack (TIA), and cerebral infarction without residual deficits: Secondary | ICD-10-CM

## 2020-10-04 DIAGNOSIS — R109 Unspecified abdominal pain: Secondary | ICD-10-CM

## 2020-10-04 DIAGNOSIS — E1169 Type 2 diabetes mellitus with other specified complication: Secondary | ICD-10-CM | POA: Diagnosis present

## 2020-10-04 DIAGNOSIS — Z20822 Contact with and (suspected) exposure to covid-19: Secondary | ICD-10-CM | POA: Diagnosis present

## 2020-10-04 DIAGNOSIS — K120 Recurrent oral aphthae: Secondary | ICD-10-CM | POA: Diagnosis present

## 2020-10-04 DIAGNOSIS — K5900 Constipation, unspecified: Secondary | ICD-10-CM | POA: Diagnosis present

## 2020-10-04 DIAGNOSIS — R627 Adult failure to thrive: Secondary | ICD-10-CM

## 2020-10-04 DIAGNOSIS — R0902 Hypoxemia: Secondary | ICD-10-CM | POA: Diagnosis present

## 2020-10-04 DIAGNOSIS — R296 Repeated falls: Secondary | ICD-10-CM | POA: Diagnosis present

## 2020-10-04 DIAGNOSIS — I10 Essential (primary) hypertension: Secondary | ICD-10-CM | POA: Diagnosis present

## 2020-10-04 DIAGNOSIS — I161 Hypertensive emergency: Secondary | ICD-10-CM

## 2020-10-04 DIAGNOSIS — I82401 Acute embolism and thrombosis of unspecified deep veins of right lower extremity: Secondary | ICD-10-CM | POA: Diagnosis present

## 2020-10-04 DIAGNOSIS — G8929 Other chronic pain: Secondary | ICD-10-CM | POA: Diagnosis present

## 2020-10-04 DIAGNOSIS — I69320 Aphasia following cerebral infarction: Secondary | ICD-10-CM

## 2020-10-04 DIAGNOSIS — Z825 Family history of asthma and other chronic lower respiratory diseases: Secondary | ICD-10-CM

## 2020-10-04 DIAGNOSIS — Z7902 Long term (current) use of antithrombotics/antiplatelets: Secondary | ICD-10-CM

## 2020-10-04 DIAGNOSIS — R54 Age-related physical debility: Secondary | ICD-10-CM | POA: Diagnosis present

## 2020-10-04 DIAGNOSIS — Z79899 Other long term (current) drug therapy: Secondary | ICD-10-CM

## 2020-10-04 NOTE — ED Provider Notes (Signed)
[facility] Emergency Department Provider Note ____________________________________________   None    (approximate)  I have reviewed the triage vital signs and the nursing notes.   HISTORY  Chief Complaint Weakness (Pt from home via EMS. EMS reports weakness and being called to home three rime this day. Pt states he feels find)    HPI Keith Carter is a 65 y.o. 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  Per EMS, they have been called out to his house several times over the last week to help him move from the bed to chair and vice versa.  He was also seen in the ED several days ago with increased generalized weakness.  The patient himself says that he felt worse then and actually feels a bit better now.  He endorses problems moving around but denies any other acute symptoms.  He has no pain other than some intermittent chronic lower back pain.  Past Medical History:  Diagnosis Date   Anxiety    Dyslipidemia    Enlarged prostate    Hypertension    Prediabetes    TIA (transient ischemic attack)     Patient Active Problem List   Diagnosis Date Noted   Spondylosis without myelopathy or radiculopathy, lumbosacral region 08/18/2020   DDD (degenerative disc disease), lumbosacral 07/28/2020   Lumbar Grade 1 Anterolisthesis of L1 over L2 (2 mm anterior translation with flexion) 04/22/2020   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) 04/22/2020   Abnormal MRI, lumbar spine (01/10/2017) 03/16/2020   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   Lumbosacral facet hypertrophy (Multilevel) (Bilateral) 03/16/2020   Lumbar facet joint syndrome (Bilateral) 03/16/2020   Osteoarthritis of facet joint of lumbar spine 03/16/2020   Lumbar lateral recess stenosis (Multilevel)  (Bilateral) 03/16/2020   Lumbosacral foraminal stenosis (Multilevel) (Bilateral) 03/16/2020   Lumbar central spinal stenosis, 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) 03/16/2020   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   Dextroscoliosis of lumbar spine 03/16/2020   Lumbar Grade 1 Retrolisthesis of L2/L3 and L3/L4 03/16/2020   Abnormal CT scan, lumbar spine (01/27/2020) 03/16/2020   Elevated hemoglobin A1c 03/16/2020   Chronic anticoagulation (PLAVIX) 03/16/2020   Anxiety 03/15/2020   Arthritis 03/15/2020   Erectile dysfunction 03/15/2020   Hypertension 03/15/2020   Chronic pain syndrome 03/15/2020   Pharmacologic therapy 03/15/2020   Disorder of skeletal system 03/15/2020   Problems influencing health status 03/15/2020   Expressive aphasia 10/08/2019   TIA (transient ischemic attack) 09/29/2019   Aphasia 09/28/2019   History of stroke 05/09/2019   Acute CVA (cerebrovascular accident) ([place]) 04/05/2019   Dysarthria    Type 2 diabetes mellitus with hyperlipidemia (HCC)    Essential hypertension    Obesity (BMI 30-39.9)    Benign prostatic hyperplasia without lower urinary tract symptoms    Stroke ([place]) 04/04/2019   Controlled type 2 diabetes mellitus without complication, without long-term current use of insulin ([place]) 09/06/2017   Pain of both elbows 04/14/2015   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[place]) 09/16/2014   Chronic low back pain (1ry area of Pain) (Bilateral) w/o sciatica 11/27/2013    Past Surgical History:  Procedure Laterality Date   BACK SURGERY     HERNIA REPAIR      Prior to Admission medications   Medication Sig Start Date End Date Taking? Authorizing Provider  acetaminophen (  TYLENOL) 500 MG tablet Take 500 mg by mouth every 6 (six) hours as needed.     [provider]  amLODipine (NORVASC) 5 MG tablet Take 1 tablet (5 mg total) by mouth daily. 10/02/19   Amin, Jeanella Flattery, MD  atorvastatin (LIPITOR) 40 MG tablet Take 1 tablet (40 mg  total) by mouth daily at 6 PM. 04/05/19   Loletha Grayer, MD  Cholecalciferol 25 MCG (1000 UT) tablet Take 5,000 Units by mouth daily.     [provider]  clopidogrel (PLAVIX) 75 MG tablet Take 75 mg by mouth daily. 10/11/19   [provider]  lisinopril (ZESTRIL) 40 MG tablet Take 40 mg by mouth at bedtime.  03/10/19   [provider]  metFORMIN (GLUCOPHAGE-XR) 500 MG 24 hr tablet Take 500 mg by mouth 2 (two) times daily. 03/10/19   [provider]  PARoxetine (PAXIL) 40 MG tablet Take 40 mg by mouth at bedtime.  02/21/19   [provider]  Potassium 99 MG TABS Take by mouth daily.    [provider]  sildenafil (REVATIO) 20 MG tablet Take 20 mg by mouth daily as needed. 12/16/18   [provider]  tamsulosin (FLOMAX) 0.4 MG CAPS capsule Take 0.8 mg by mouth at bedtime.  02/26/19   [provider]    Allergies Patient has no known allergies.  Family History  Problem Relation Age of Onset   Stroke Neg Hx     Social History Social History   Tobacco Use   Smoking status: Never   Smokeless tobacco: Never  Vaping Use   Vaping Use: Never used  Substance Use Topics   Alcohol use: Yes   Drug use: Never    Review of Systems  Constitutional: No fever.  Eyes: No redness. ENT: No sore throat. Cardiovascular: Denies chest pain. Respiratory: Denies shortness of breath. Gastrointestinal: No vomiting or diarrhea.  Genitourinary: Negative for dysuria.  Musculoskeletal: Positive for chronic back pain. Skin: Negative for rash. Neurological: Negative for headache.   ____________________________________________   PHYSICAL EXAM:  VITAL SIGNS: ED Triage Vitals [10/04/20 2321]  Enc Vitals Group     BP      Pulse      Resp      Temp      Temp src      SpO2      Weight      Height      Head Circumference      Peak Flow      Pain Score 0     Pain Loc      Pain Edu?      Excl. in [place]?     Constitutional: Alert  and oriented.  Somewhat weak appearing but in no acute distress. Eyes: Conjunctivae are normal.  EOMI.  PERRLA. Head: Atraumatic. Nose: No congestion/rhinnorhea. Mouth/Throat: Mucous membranes are slightly dry. Neck: Normal range of motion.  Cardiovascular: Normal rate, regular rhythm. Grossly normal heart sounds.  Good peripheral circulation. Respiratory: Normal respiratory effort.  No retractions. Lungs CTAB. Gastrointestinal: Soft and nontender. No distention.  Genitourinary: No flank tenderness. Musculoskeletal: 1+ bilateral lower extremity edema.  Extremities warm and well perfused.  Neurologic:  Normal speech and language.  Motor intact in all extremities.  No pronator drift.  No facial droop. Skin:  Skin is warm and dry. No rash noted. Psychiatric: Calm and cooperative.  ____________________________________________   LABS (all labs ordered are listed, but only abnormal results are displayed)  Labs Reviewed  COMPREHENSIVE  METABOLIC PANEL - Abnormal; Notable for the following components:      Result Value   Glucose, Bld 250 (*)    Albumin 3.4 (*)    Total Bilirubin 1.4 (*)    All other components within normal limits  URINALYSIS, COMPLETE (UACMP) WITH MICROSCOPIC - Abnormal; Notable for the following components:   Color, Urine YELLOW (*)    APPearance CLEAR (*)    Specific Gravity, Urine >1.030 (*)    Glucose, UA 500 (*)    Ketones, ur TRACE (*)    Protein, ur 30 (*)    All other components within normal limits  TROPONIN I (HIGH SENSITIVITY) - Abnormal; Notable for the following components:   Troponin I (High Sensitivity) 20 (*)    All other components within normal limits  TROPONIN I (HIGH SENSITIVITY) - Abnormal; Notable for the following components:   Troponin I (High Sensitivity) 20 (*)    All other components within normal limits  RESP PANEL BY RT-PCR (FLU A&B, COVID) ARPGX2  CBC WITH DIFFERENTIAL/PLATELET  LACTIC ACID, PLASMA  URINE DRUG SCREEN, QUALITATIVE  ([facility] ONLY)   ____________________________________________  EKG  ED ECG REPORT I, Arta Silence, the attending physician, personally viewed and interpreted this ECG.  Date: 10/04/2020 EKG Time: 2327 Rate: 85 Rhythm: normal sinus rhythm QRS Axis: normal Intervals: RBBB, LAFB ST/T Wave abnormalities: normal Narrative Interpretation: no evidence of acute ischemia  ____________________________________________  RADIOLOGY  Chest x-ray interpreted by me shows no focal consolidation or edema CT head: No ICH or other acute abnormality  ____________________________________________   PROCEDURES  Procedure(s) performed: No  Procedures  Critical Care performed: No ____________________________________________   INITIAL IMPRESSION / ASSESSMENT AND PLAN / ED COURSE  Pertinent labs & imaging results that were available during my care of the patient were reviewed by me and considered in my medical decision making (see chart for details).   65 year old male with PMH as noted above including hypertension, TIA and CVA, degenerative disc disease, spinal stenosis and chronic issues with his lumbar spine presents with generalized weakness and increased difficulty with mobility, requiring EMS to come out to his house multiple times to help him move.  I reviewed the past medical records in [place].  The patient was just seen in the ED on 9/1 with 2 days of generalized weakness.  He had a reassuring work-up; MRI of the lumbar spine showed no acute abnormality although the patient has progression of left L1-2 foraminal stenosis.  MRI brain showed no acute findings.  At that time he was able to ambulate with assistance and was felt to be at his baseline.  On exam the patient is somewhat weak appearing but in no acute distress.  He appears tired but is alert and able to answer all my questions appropriately.  He is oriented x4 although did not know the year.  His vital signs are normal except for  hypertension.  Neurologic exam is nonfocal.  Differential is broad but includes benign etiologies such as deconditioning or progression of his chronic lumbar spine issues, versus acute infection/sepsis, dehydration, other metabolic etiology, or less likely primary CNS or cardiac cause.  We will obtain CT head, chest x-ray, lab work-up, and reassess.  He has no acute neuro deficits and there is no indication for repeat imaging of the lumbar spine.  ----------------------------------------- 4:00 AM on 10/05/2020 -----------------------------------------  CT head is negative.  The lab work-up is overall unremarkable.  The initial troponin was minimally elevated but there is no increase on  the repeat and the patient does not have any chest pain or EKG findings to suggest ischemia.  This is likely his baseline.  There is no evidence of ACS.  At this time, the patient does not meet any criteria for admission, however I do not feel that he is safe to go home given his worsening mobility issues.  I have ordered physical therapy and TOC consult for possible rehab or nursing facility placement.  ____________________________________________   FINAL CLINICAL IMPRESSION(S) / ED DIAGNOSES  Final diagnoses:  Weakness  Impaired ambulation      NEW MEDICATIONS STARTED DURING THIS VISIT:  New Prescriptions   No medications on file     Note:  This document was prepared using Dragon voice recognition software and may include unintentional dictation errors.    Arta Silence, MD 10/05/20 [phone]

## 2020-10-05 LAB — COMPREHENSIVE METABOLIC PANEL
ALT: 16 U/L (ref 0–44)
AST: 16 U/L (ref 15–41)
Albumin: 3.4 g/dL — ABNORMAL LOW (ref 3.5–5.0)
Alkaline Phosphatase: 86 U/L (ref 38–126)
Anion gap: 6 (ref 5–15)
BUN: 17 mg/dL (ref 8–23)
CO2: 27 mmol/L (ref 22–32)
Calcium: 9.2 mg/dL (ref 8.9–10.3)
Chloride: 106 mmol/L (ref 98–111)
Creatinine, Ser: 1.1 mg/dL (ref 0.61–1.24)
GFR, Estimated: >60 mL/min (ref >60)
Glucose, Bld: 250 mg/dL — ABNORMAL HIGH (ref 70–99)
Potassium: 3.8 mmol/L (ref 3.5–5.1)
Sodium: 139 mmol/L (ref 135–145)
Total Bilirubin: 1.4 mg/dL — ABNORMAL HIGH (ref 0.3–1.2)
Total Protein: 6.5 g/dL (ref 6.5–8.1)

## 2020-10-05 LAB — URINALYSIS, COMPLETE (UACMP) WITH MICROSCOPIC
Bacteria, UA: NONE SEEN
Bilirubin Urine: NEGATIVE
Glucose, UA: 500 mg/dL — AB
Hgb urine dipstick: NEGATIVE
Leukocytes,Ua: NEGATIVE
Nitrite: NEGATIVE
Protein, ur: 30 mg/dL — AB
Specific Gravity, Urine: >1.03 — ABNORMAL HIGH (ref 1.005–1.030)
pH: 5.5 (ref 5.0–8.0)

## 2020-10-05 LAB — CBC WITH DIFFERENTIAL/PLATELET
Abs Immature Granulocytes: 0.06 10*3/uL (ref 0.00–0.07)
Basophils Absolute: 0.1 10*3/uL (ref 0.0–0.1)
Basophils Relative: 1 %
Eosinophils Absolute: 0.1 10*3/uL (ref 0.0–0.5)
Eosinophils Relative: 1 %
HCT: 42.5 % (ref 39.0–52.0)
Hemoglobin: 14.4 g/dL (ref 13.0–17.0)
Immature Granulocytes: 1 %
Lymphocytes Relative: 11 %
Lymphs Abs: 1 10*3/uL (ref 0.7–4.0)
MCH: 30.3 pg (ref 26.0–34.0)
MCHC: 33.9 g/dL (ref 30.0–36.0)
MCV: 89.3 fL (ref 80.0–100.0)
Monocytes Absolute: 0.8 10*3/uL (ref 0.1–1.0)
Monocytes Relative: 8 %
Neutro Abs: 7.4 10*3/uL (ref 1.7–7.7)
Neutrophils Relative %: 78 %
Platelets: 206 10*3/uL (ref 150–400)
RBC: 4.76 MIL/uL (ref 4.22–5.81)
RDW: 13.3 % (ref 11.5–15.5)
WBC: 9.4 10*3/uL (ref 4.0–10.5)
nRBC: 0 % (ref 0.0–0.2)

## 2020-10-05 LAB — URINE DRUG SCREEN, QUALITATIVE (ARMC ONLY)
Amphetamines, Ur Screen: NOT DETECTED
Barbiturates, Ur Screen: NOT DETECTED
Benzodiazepine, Ur Scrn: NOT DETECTED
Cannabinoid 50 Ng, Ur ~~LOC~~: NOT DETECTED
Cocaine Metabolite,Ur ~~LOC~~: NOT DETECTED
MDMA (Ecstasy)Ur Screen: NOT DETECTED
Methadone Scn, Ur: NOT DETECTED
Opiate, Ur Screen: NOT DETECTED
Phencyclidine (PCP) Ur S: NOT DETECTED
Tricyclic, Ur Screen: NOT DETECTED

## 2020-10-05 LAB — TROPONIN I (HIGH SENSITIVITY)
Troponin I (High Sensitivity): 20 ng/L — ABNORMAL HIGH (ref <18)
Troponin I (High Sensitivity): 20 ng/L — ABNORMAL HIGH (ref <18)

## 2020-10-05 LAB — RESP PANEL BY RT-PCR (FLU A&B, COVID) ARPGX2
Influenza A by PCR: NEGATIVE
Influenza B by PCR: NEGATIVE
SARS Coronavirus 2 by RT PCR: NEGATIVE

## 2020-10-05 LAB — LACTIC ACID, PLASMA: Lactic Acid, Venous: 1.6 mmol/L (ref 0.5–1.9)

## 2020-10-05 MED ORDER — TAMSULOSIN HCL 0.4 MG PO CAPS
0.8000 mg | ORAL_CAPSULE | Freq: Every day | ORAL | Status: DC
  Administered 2020-10-05 – 2020-10-11 (×7): 0.8 mg via ORAL
  Filled 2020-10-05 (×7): qty 2

## 2020-10-05 MED ORDER — ACETAMINOPHEN 500 MG PO TABS
500.0000 mg | ORAL_TABLET | Freq: Four times a day (QID) | ORAL | Status: DC | PRN
  Administered 2020-10-06: 500 mg via ORAL
  Filled 2020-10-05: qty 1

## 2020-10-05 MED ORDER — ONDANSETRON HCL 4 MG/2ML IJ SOLN
4.0000 mg | Freq: Once | INTRAMUSCULAR | Status: AC
  Administered 2020-10-05: 4 mg via INTRAVENOUS
  Filled 2020-10-05: qty 2

## 2020-10-05 MED ORDER — ATORVASTATIN CALCIUM 20 MG PO TABS
40.0000 mg | ORAL_TABLET | Freq: Every day | ORAL | Status: DC
  Administered 2020-10-07 – 2020-10-11 (×5): 40 mg via ORAL
  Filled 2020-10-05 (×7): qty 2

## 2020-10-05 MED ORDER — LISINOPRIL 20 MG PO TABS
40.0000 mg | ORAL_TABLET | Freq: Every day | ORAL | Status: DC
  Administered 2020-10-05 – 2020-10-11 (×7): 40 mg via ORAL
  Filled 2020-10-05 (×2): qty 4
  Filled 2020-10-05: qty 2
  Filled 2020-10-05: qty 4
  Filled 2020-10-05 (×3): qty 2

## 2020-10-05 MED ORDER — PAROXETINE HCL 20 MG PO TABS
40.0000 mg | ORAL_TABLET | Freq: Every day | ORAL | Status: DC
  Administered 2020-10-07 – 2020-10-11 (×5): 40 mg via ORAL
  Filled 2020-10-05 (×8): qty 2

## 2020-10-05 MED ORDER — METFORMIN HCL ER 500 MG PO TB24
1000.0000 mg | ORAL_TABLET | Freq: Two times a day (BID) | ORAL | Status: DC
  Administered 2020-10-06 – 2020-10-09 (×4): 1000 mg via ORAL
  Filled 2020-10-05 (×9): qty 2

## 2020-10-05 MED ORDER — CLOPIDOGREL BISULFATE 75 MG PO TABS
75.0000 mg | ORAL_TABLET | Freq: Every day | ORAL | Status: DC
  Administered 2020-10-06 – 2020-10-12 (×6): 75 mg via ORAL
  Filled 2020-10-05 (×6): qty 1

## 2020-10-05 MED ORDER — VITAMIN D 25 MCG (1000 UNIT) PO TABS
5000.0000 [IU] | ORAL_TABLET | Freq: Every day | ORAL | Status: DC
  Administered 2020-10-06 – 2020-10-12 (×6): 5000 [IU] via ORAL
  Filled 2020-10-05 (×6): qty 5

## 2020-10-05 MED ORDER — AMLODIPINE BESYLATE 5 MG PO TABS
5.0000 mg | ORAL_TABLET | Freq: Every day | ORAL | Status: DC
  Administered 2020-10-06 – 2020-10-12 (×6): 5 mg via ORAL
  Filled 2020-10-05 (×6): qty 1

## 2020-10-05 NOTE — ED Notes (Signed)
Pt given the phone at this time. Pt attempting to get in touch with wife with the help from the [facility] RN

## 2020-10-05 NOTE — ED Notes (Signed)
Patients daughter here, requesting update.  Advised that Case Management and PT has seen patient and both have recommended patient go to SNF facility however patient does not want to go. She states that she has talked with her Dad and told him he has to as her mother has Parkinson's and is unable to take care of him.  She is POA and would like an update.

## 2020-10-05 NOTE — Evaluation (Signed)
Physical Therapy Evaluation Patient Details Name: Keith Carter MRN: [id number] DOB: Jul 10, 1955 Today's Date: 10/05/2020   History of Present Illness  Keith Carter is a 65 y.o. 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  Per EMS, they have been called out to his house several times over the last week to help him move from the bed to chair and vice versa.  Clinical Impression  Pt is a pleasant 65 year old male who was admitted for weakness. Pt performs bed mobility/transfers with mod assist and ambulation with min assist at bedside. Pt demonstrates deficits with strength/mobility/endurance/safety awareness. Pt is high falls risk and doesn't appear to be at baseline level. Pt uncertain of going to rehab as he doesn't want to leave his wife at home as he is her caregiver. Would benefit from skilled PT to address above deficits and promote optimal return to PLOF; recommend transition to STR upon discharge from acute hospitalization.      Follow Up Recommendations SNF (however pt declining SNF)    Equipment Recommendations   (TBD)    Recommendations for Other Services       Precautions / Restrictions Precautions Precautions: Fall Restrictions Weight Bearing Restrictions: No      Mobility  Bed Mobility Overal bed mobility: Needs Assistance Bed Mobility: Supine to Sit     Supine to sit: Mod assist     General bed mobility comments: needs heavy assist for trunkal assist. Once seated, upright posture noted. Assist for B LE once returned to bed.    Transfers Overall transfer level: Needs assistance Equipment used: Rolling walker (2 wheeled) Transfers: Sit to/from Stand Sit to Stand: Mod assist         General transfer comment: needs heavy assist and cues for hand placement. Once standing, only  requires cga  Ambulation/Gait Ambulation/Gait assistance: Min assist Gait Distance (Feet): 3 Feet Assistive device: Rolling walker (2 wheeled) Gait Pattern/deviations: Step-to pattern     General Gait Details: ambulated with side steps up towards HOB. Fatigues quickly. Not safe to further ambulate due to weakness despite pt requesting to ambulate further  Stairs            Wheelchair Mobility    Modified Rankin (Stroke Patients Only)       Balance Overall balance assessment: History of Falls;Needs assistance Sitting-balance support: Feet supported Sitting balance-Leahy Scale: Fair     Standing balance support: Bilateral upper extremity supported Standing balance-Leahy Scale: Poor                               Pertinent Vitals/Pain Pain Assessment: Faces Faces Pain Scale: Hurts a little bit Pain Location: low back Pain Descriptors / Indicators: Aching Pain Intervention(s): Limited activity within patient's tolerance;Repositioned    Home Living Family/patient expects to be discharged to:: Private residence Living Arrangements: Spouse/significant other Available Help at Discharge: Family Type of Home: House Home Access: Ramped entrance     Home Layout: One level Home Equipment: Environmental consultant - 2 wheels;Wheelchair - manual      Prior Function Level of Independence: Needs assistance   Gait / Transfers Assistance Needed: reports he ambulates short distances and uses RW. Has WC, however hasn't needed to use until recently           Hand Dominance  Extremity/Trunk Assessment   Upper Extremity Assessment Upper Extremity Assessment: Generalized weakness (B UE grossly 4/5)    Lower Extremity Assessment Lower Extremity Assessment: Generalized weakness (B LE grossly 3/5)       Communication   Communication: No difficulties  Cognition Arousal/Alertness: Awake/alert Behavior During Therapy: WFL for tasks assessed/performed Overall  Cognitive Status: Within Functional Limits for tasks assessed                                 General Comments: appears somewhat confused upon asking A&O questions. Unsure of complete orientation.      General Comments      Exercises Other Exercises Other Exercises: seated ther-ex performed on B LE including LAQ, alt marching and shoulder shrugs. 10 reps with supervision   Assessment/Plan    PT Assessment Patient needs continued PT services  PT Problem List Decreased strength;Decreased balance;Decreased mobility;Decreased safety awareness       PT Treatment Interventions DME instruction;Gait training;Therapeutic exercise;Balance training    PT Goals (Current goals can be found in the Care Plan section)  Acute Rehab PT Goals Patient Stated Goal: to go home PT Goal Formulation: With patient Time For Goal Achievement: 10/19/20 Potential to Achieve Goals: Good    Frequency Min 2X/week   Barriers to discharge        Co-evaluation               AM-PAC PT "6 Clicks" Mobility  Outcome Measure Help needed turning from your back to your side while in a flat bed without using bedrails?: A Little Help needed moving from lying on your back to sitting on the side of a flat bed without using bedrails?: A Little Help needed moving to and from a bed to a chair (including a wheelchair)?: A Lot Help needed standing up from a chair using your arms (e.g., wheelchair or bedside chair)?: A Lot Help needed to walk in hospital room?: A Lot Help needed climbing 3-5 steps with a railing? : Total 6 Click Score: 13    End of Session Equipment Utilized During Treatment: Gait belt Activity Tolerance: Patient tolerated treatment well Patient left: in bed Nurse Communication: Mobility status PT Visit Diagnosis: Muscle weakness (generalized) (M62.81);History of falling (Z91.81);Repeated falls (R29.6);Difficulty in walking, not elsewhere classified (R26.2)    Time:  [id number] PT Time Calculation (min) (ACUTE ONLY): 18 min   Charges:   PT Evaluation $PT Eval Low Complexity: 1 Low PT Treatments $Therapeutic Exercise: 8-22 mins        Greggory Stallion, PT, DPT [phone]   Jocelyne Reinertsen 10/05/2020, 4:36 PM

## 2020-10-05 NOTE — TOC Initial Note (Signed)
Transition of Care [facility]) - Initial/Assessment Note    Patient Details  Name: Keith Carter MRN: [id number] Date of Birth: [birth year]  [facility] CM/SW Contact:    Anselm Pancoast, RN Phone Number: 10/05/2020, 11:23 AM  Clinical Narrative:                 Met with patient at bedside. Patient concerned about returning home with wife who has Parkinsons and depends on him for assistance. Patient has daughter who lives in [place] but works 100 hours a week and is not able to assist. Patient unsure  if he wants to go to short term rehab or home with hhc. Agrees to work with PT and make final decision. Patient attempted to contact daughter and spouse on phone during visit but no answer to either. Patient reports he has good and bad days and at times cant get out bed but most days he is able to complete ADL's with walker and minimal assistance from wife.   Expected Discharge Plan: Skilled Nursing Facility Barriers to Discharge: Continued Medical Work up   Patient Goals and CMS Choice Patient states their goals for this hospitalization and ongoing recovery are:: Get back home with wife      Expected Discharge Plan and Services Expected Discharge Plan: [place]       Living arrangements for the past 2 months: Single Family Home                                      Prior Living Arrangements/Services Living arrangements for the past 2 months: Single Family Home Lives with:: Spouse Patient language and need for interpreter reviewed:: Yes Do you feel safe going back to the place where you live?: Yes      Need for Family Participation in Patient Care: Yes (Comment) Care giver support system in place?: Yes (comment)   Criminal Activity/Legal Involvement Pertinent to Current Situation/Hospitalization: No - Comment as needed  Activities of Daily Living      Permission Sought/Granted Permission sought to share information with : Facility Automotive engineer, Case Optician, dispensing granted to share information with : Yes, Verbal Permission Granted  Share Information with NAME: Sandra-Spouse           Emotional Assessment Appearance:: Appears stated age Attitude/Demeanor/Rapport: Engaged Affect (typically observed): Accepting, Pleasant Orientation: : Oriented to Self, Oriented to Place, Oriented to  Time, Oriented to Situation Alcohol / Substance Use: Not Applicable Psych Involvement: No (comment)  Admission diagnosis:  Fall/Weakness Patient Active Problem List   Diagnosis Date Noted   Spondylosis without myelopathy or radiculopathy, lumbosacral region 08/18/2020   DDD (degenerative disc disease), lumbosacral 07/28/2020   Lumbar Grade 1 Anterolisthesis of L1 over L2 (2 mm anterior translation with flexion) 04/22/2020   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) 04/22/2020   Abnormal MRI, lumbar spine (01/10/2017) 03/16/2020   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   Lumbosacral facet hypertrophy (Multilevel) (Bilateral) 03/16/2020   Lumbar facet joint syndrome (Bilateral) 03/16/2020   Osteoarthritis of facet joint of lumbar spine 03/16/2020   Lumbar lateral recess stenosis (Multilevel) (Bilateral) 03/16/2020   Lumbosacral foraminal stenosis (Multilevel) (Bilateral) 03/16/2020   Lumbar central spinal stenosis, 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) 03/16/2020   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   Dextroscoliosis of lumbar spine 03/16/2020   Lumbar Grade 1 Retrolisthesis of L2/L3 and L3/L4 03/16/2020  Abnormal CT scan, lumbar spine (01/27/2020) 03/16/2020   Elevated hemoglobin A1c 03/16/2020   Chronic anticoagulation (PLAVIX) 03/16/2020   Anxiety 03/15/2020   Arthritis 03/15/2020   Erectile dysfunction 03/15/2020   Hypertension 03/15/2020   Chronic pain syndrome 03/15/2020   Pharmacologic therapy 03/15/2020   Disorder of skeletal system 03/15/2020   Problems influencing health status  03/15/2020   Expressive aphasia 10/08/2019   TIA (transient ischemic attack) 09/29/2019   Aphasia 09/28/2019   History of stroke 05/09/2019   Acute CVA (cerebrovascular accident) ([place]) 04/05/2019   Dysarthria    Type 2 diabetes mellitus with hyperlipidemia (HCC)    Essential hypertension    Obesity (BMI 30-39.9)    Benign prostatic hyperplasia without lower urinary tract symptoms    Stroke ([place]) 04/04/2019   Controlled type 2 diabetes mellitus without complication, without long-term current use of insulin ([place]) 09/06/2017   Pain of both elbows 04/14/2015   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[place]) 09/16/2014   Chronic low back pain (1ry area of Pain) (Bilateral) w/o sciatica 11/27/2013   PCP:  [name], NP Pharmacy:   [facility] [address], [place] - [place] [address] Phone: [phone] Fax: [phone]     Social Determinants of Health (SDOH) Interventions    Readmission Risk Interventions No flowsheet data found.

## 2020-10-05 NOTE — ED Notes (Signed)
Case Management at bedside.

## 2020-10-05 NOTE — ED Notes (Signed)
Pt able to use urinal without assistance.  

## 2020-10-05 NOTE — ED Notes (Signed)
Keith Carter, pt's step son called to express his concerns to this RN. Mr. Keith Carter states that there is no one at the home to take care of the pt anymore. Pt's wife has Parkinson's and unable to take care of pt. EMS has been to the residence multiple times to help pt after he has fall or need help with getting him out of bed. Pt is incontinent and unable to get to the bathroom in time. Mr. Keith Carter states that he wishes the father not be sent home without an evaluation to the social worker. Verbalized that pt was still here waiting to see the SW and that may be delayed due to Labor Day.

## 2020-10-05 NOTE — ED Notes (Signed)
Pt ambulatory to the bathroom with 1 person assistance and walker at this time. Pt had large BM. Placed into a hospital bed at this time.

## 2020-10-05 NOTE — ED Notes (Signed)
Patient is resting comfortably. 

## 2020-10-05 NOTE — TOC Progression Note (Addendum)
Transition of Care [facility]) - Progression Note    Patient Details  Name: Keith Carter MRN: [id number] Date of Birth: 1955-06-19  Transition of Care [facility]) CM/SW Contact  Anselm Pancoast, RN Phone Number: 10/05/2020, 2:55 PM  Clinical Narrative:    Outreach to Judson Roch @ [facility] for availability for [facility] services including PT.   Patient is wanting to discharge home so he can assist his wife. Patient does not want to discharge to SNF. Patient spoke with PT regarding his concerns.    Expected Discharge Plan: [place] Barriers to Discharge: Continued Medical Work up  Expected Discharge Plan and Services Expected Discharge Plan: [place] arrangements for the past 2 months: Single Family Home                                       Social Determinants of Health (SDOH) Interventions    Readmission Risk Interventions No flowsheet data found.

## 2020-10-05 NOTE — ED Notes (Signed)
Report received from [facility] at this time.

## 2020-10-06 LAB — CBC WITH DIFFERENTIAL/PLATELET
Abs Immature Granulocytes: 0.07 10*3/uL (ref 0.00–0.07)
Basophils Absolute: 0 10*3/uL (ref 0.0–0.1)
Basophils Relative: 0 %
Eosinophils Absolute: 0 10*3/uL (ref 0.0–0.5)
Eosinophils Relative: 0 %
HCT: 44 % (ref 39.0–52.0)
Hemoglobin: 15 g/dL (ref 13.0–17.0)
Immature Granulocytes: 1 %
Lymphocytes Relative: 6 %
Lymphs Abs: 0.6 10*3/uL — ABNORMAL LOW (ref 0.7–4.0)
MCH: 30.1 pg (ref 26.0–34.0)
MCHC: 34.1 g/dL (ref 30.0–36.0)
MCV: 88.2 fL (ref 80.0–100.0)
Monocytes Absolute: 0.4 10*3/uL (ref 0.1–1.0)
Monocytes Relative: 4 %
Neutro Abs: 9.5 10*3/uL — ABNORMAL HIGH (ref 1.7–7.7)
Neutrophils Relative %: 89 %
Platelets: 206 10*3/uL (ref 150–400)
RBC: 4.99 MIL/uL (ref 4.22–5.81)
RDW: 13.2 % (ref 11.5–15.5)
WBC: 10.6 10*3/uL — ABNORMAL HIGH (ref 4.0–10.5)
nRBC: 0 % (ref 0.0–0.2)

## 2020-10-06 LAB — COMPREHENSIVE METABOLIC PANEL
ALT: 14 U/L (ref 0–44)
AST: 15 U/L (ref 15–41)
Albumin: 3.4 g/dL — ABNORMAL LOW (ref 3.5–5.0)
Alkaline Phosphatase: 81 U/L (ref 38–126)
Anion gap: 11 (ref 5–15)
BUN: 14 mg/dL (ref 8–23)
CO2: 27 mmol/L (ref 22–32)
Calcium: 9 mg/dL (ref 8.9–10.3)
Chloride: 101 mmol/L (ref 98–111)
Creatinine, Ser: 1.03 mg/dL (ref 0.61–1.24)
GFR, Estimated: >60 mL/min (ref >60)
Glucose, Bld: 255 mg/dL — ABNORMAL HIGH (ref 70–99)
Potassium: 3.5 mmol/L (ref 3.5–5.1)
Sodium: 139 mmol/L (ref 135–145)
Total Bilirubin: 1.6 mg/dL — ABNORMAL HIGH (ref 0.3–1.2)
Total Protein: 6.7 g/dL (ref 6.5–8.1)

## 2020-10-06 LAB — TROPONIN I (HIGH SENSITIVITY): Troponin I (High Sensitivity): 12 ng/L (ref <18)

## 2020-10-06 MED ORDER — ONDANSETRON HCL 4 MG/2ML IJ SOLN
4.0000 mg | Freq: Once | INTRAMUSCULAR | Status: AC
  Administered 2020-10-06: 4 mg via INTRAVENOUS
  Filled 2020-10-06: qty 2

## 2020-10-06 MED ORDER — ONDANSETRON 4 MG PO TBDP
4.0000 mg | ORAL_TABLET | Freq: Once | ORAL | Status: AC
  Administered 2020-10-06: 4 mg via ORAL
  Filled 2020-10-06: qty 1

## 2020-10-06 MED ORDER — PANTOPRAZOLE SODIUM 40 MG PO TBEC
40.0000 mg | DELAYED_RELEASE_TABLET | Freq: Two times a day (BID) | ORAL | Status: DC
  Administered 2020-10-06 – 2020-10-07 (×2): 40 mg via ORAL
  Filled 2020-10-06 (×2): qty 1

## 2020-10-06 MED ORDER — PANTOPRAZOLE SODIUM 40 MG IV SOLR
40.0000 mg | Freq: Once | INTRAVENOUS | Status: AC
  Administered 2020-10-06: 40 mg via INTRAVENOUS
  Filled 2020-10-06: qty 40

## 2020-10-06 MED ORDER — ONDANSETRON 4 MG PO TBDP
4.0000 mg | ORAL_TABLET | Freq: Three times a day (TID) | ORAL | Status: DC | PRN
  Administered 2020-10-06: 4 mg via ORAL
  Filled 2020-10-06: qty 1

## 2020-10-06 NOTE — ED Provider Notes (Signed)
Patient relocated ED bed 25.  Have given signout with ongoing plan of care including update regarding the patient's nausea and vomiting over the last 24 hours as well as plans for [facility] placement to Dr. Thomasene Lot, MD 10/06/20 2014

## 2020-10-06 NOTE — ED Notes (Signed)
Unsuccessful lab draw x2.  Will call lab.

## 2020-10-06 NOTE — ED Notes (Signed)
Patient oxygen saturation noted to be 85% on room air while sleeping, O2 2LNC placed on patient, he denies using CPAP at home when sleeping.

## 2020-10-06 NOTE — ED Provider Notes (Signed)
Patient does report he is feeling better.  He is currently drinking fluids on about his fourth cup of water.  Nausea is well controlled not in any pain.  We will continue on as needed Zofran, and he is now on scheduled PPI.  Updated social work as well, but anticipate potentially discharging in 24 to 48 hours to SNF which the patient is understanding of.  Currently appears to be improved with regard to nausea vomiting but we will certainly continue to be observed overnight   Delman Kitten, MD 10/06/20 1727

## 2020-10-06 NOTE — ED Notes (Signed)
Upon assessment, Pt noted to have emesis on his gown.  Sts he has been vomiting since 2300 on 9/5.  Will notify EDP.

## 2020-10-06 NOTE — ED Notes (Signed)
Patient with episode of emesis, he states he isn't nauseated anymore doesn't want anything for nausea at this time.

## 2020-10-06 NOTE — ED Notes (Signed)
Lab at bedside

## 2020-10-06 NOTE — ED Notes (Signed)
Pt noted to have removed gown, cardiac leads, and asking to remove other monitoring devices.  Pt upset that he still has not been placed and did not see PT today.

## 2020-10-06 NOTE — NC FL2 (Signed)
[place] LEVEL OF CARE SCREENING TOOL     IDENTIFICATION  Patient Name: Keith Carter Birthdate: Dec 01, 1955 Sex: male Admission Date (Current Location): 10/04/2020  [facility] and [place] Number:  Engineering geologist and Address:  [facility], [address], [place], [place] [address]      Provider Number: [id number]  Attending Physician Name and Address:  No att. 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) 04/22/2020   Abnormal MRI, lumbar spine (01/10/2017) 03/16/2020   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   Lumbosacral facet hypertrophy (Multilevel) (Bilateral) 03/16/2020   Lumbar facet joint syndrome (Bilateral) 03/16/2020   Osteoarthritis of facet joint of lumbar spine 03/16/2020   Lumbar lateral recess stenosis (Multilevel) (Bilateral) 03/16/2020   Lumbosacral foraminal stenosis (Multilevel) (Bilateral) 03/16/2020   Lumbar central spinal stenosis, 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) 03/16/2020   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   Dextroscoliosis of lumbar spine 03/16/2020   Lumbar Grade 1 Retrolisthesis of L2/L3 and L3/L4 03/16/2020   Abnormal CT scan, lumbar spine (01/27/2020) 03/16/2020   Elevated hemoglobin A1c 03/16/2020   Chronic anticoagulation (PLAVIX) 03/16/2020   Anxiety 03/15/2020   Arthritis  03/15/2020   Erectile dysfunction 03/15/2020   Hypertension 03/15/2020   Chronic pain syndrome 03/15/2020   Pharmacologic therapy 03/15/2020   Disorder of skeletal system 03/15/2020   Problems influencing health status 03/15/2020   Expressive aphasia 10/08/2019   TIA (transient ischemic attack) 09/29/2019   Aphasia 09/28/2019   History of stroke 05/09/2019   Acute CVA (cerebrovascular accident) ([place]) 04/05/2019   Dysarthria    Type 2 diabetes mellitus with hyperlipidemia (HCC)    Essential hypertension    Obesity (BMI 30-39.9)    Benign prostatic hyperplasia without lower urinary tract symptoms    Stroke ([place]) 04/04/2019   Controlled type 2 diabetes mellitus without complication, without long-term current use of insulin ([place]) 09/06/2017   Pain of both elbows 04/14/2015   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[place]) 09/16/2014   Chronic low back pain (1ry area of Pain) (Bilateral) w/o sciatica 11/27/2013    Orientation RESPIRATION BLADDER Height & Weight     Self, Time, Situation, Place  Normal Continent Weight: 259 lb 14.8 oz (117.9 kg) Height:  '6\' 1"'$  (185.4 cm)  BEHAVIORAL SYMPTOMS/MOOD NEUROLOGICAL BOWEL NUTRITION STATUS      Continent Diet  AMBULATORY STATUS COMMUNICATION OF NEEDS Skin   Limited Assist Verbally Normal                       Personal Care Assistance Level of Assistance  Bathing, Feeding, Dressing, Total care Bathing Assistance: Limited assistance Feeding assistance: Independent Dressing Assistance: Limited assistance Total Care Assistance: Limited assistance   Functional Limitations Info  Sight, Hearing, Speech Sight Info: Adequate Hearing Info: Adequate Speech Info: Adequate    SPECIAL CARE FACTORS FREQUENCY  PT (By licensed PT), OT (By licensed OT)  PT Frequency: 5X per week OT Frequency: 5X per week            Contractures Contractures Info: Not present    Additional Factors Info          PFIZER Comrnaty([place]  TOP) Covid-19 Vaccine 11/28/2019 , 04/08/2019 , 03/18/2019           Current Medications (10/06/2020):  This is the current hospital active medication list Current Facility-Administered Medications  Medication Dose Route Frequency Provider Last Rate Last Admin   acetaminophen (TYLENOL) tablet 500 mg  500 mg Oral Q6H PRN Vanessa [place], MD       amLODipine (NORVASC) tablet 5 mg  5 mg Oral Daily Vanessa Garfield, MD       atorvastatin (LIPITOR) tablet 40 mg  40 mg Oral q1800 Vanessa Masury, MD       cholecalciferol (VITAMIN D3) tablet 5,000 Units  5,000 Units Oral Daily Vanessa Bethel, MD       clopidogrel (PLAVIX) tablet 75 mg  75 mg Oral Daily Vanessa [place], MD       lisinopril (ZESTRIL) tablet 40 mg  40 mg Oral QHS Vanessa [place], MD   40 mg at 10/05/20 2322   metFORMIN (GLUCOPHAGE-XR) 24 hr tablet 1,000 mg  1,000 mg Oral BID WC Vanessa [place], MD       PARoxetine (PAXIL) tablet 40 mg  40 mg Oral QHS Vanessa Regino Ramirez, MD       tamsulosin [facility]) capsule 0.8 mg  0.8 mg Oral QHS Vanessa , MD   0.8 mg at 10/05/20 2323   Current Outpatient Medications  Medication Sig Dispense Refill   amLODipine (NORVASC) 5 MG tablet Take 1 tablet (5 mg total) by mouth daily. 30 tablet 0   clopidogrel (PLAVIX) 75 MG tablet Take 75 mg by mouth daily.     lisinopril (ZESTRIL) 40 MG tablet Take 40 mg by mouth at bedtime.      metFORMIN (GLUCOPHAGE-XR) 500 MG 24 hr tablet Take 1,000 mg by mouth 2 (two) times daily.     tamsulosin (FLOMAX) 0.4 MG CAPS capsule Take 0.8 mg by mouth at bedtime.      acetaminophen (TYLENOL) 500 MG tablet Take 500 mg by mouth every 6 (six) hours as needed.      atorvastatin (LIPITOR) 40 MG tablet Take 1 tablet (40 mg total) by mouth daily at 6 PM. 30 tablet 0   Cholecalciferol 25 MCG (1000 UT) tablet Take 5,000 Units by mouth daily.      PARoxetine (PAXIL) 40 MG tablet Take 40 mg by mouth at bedtime.      Potassium 99 MG TABS Take by mouth daily.     sildenafil (REVATIO) 20 MG tablet Take 20  mg by mouth daily as needed.       Discharge Medications: Please see discharge summary for a list of discharge medications.  Relevant Imaging Results:  Relevant Lab Results:   Additional Information SS# [id number]  Adelene Amas, LCSWA

## 2020-10-06 NOTE — ED Notes (Signed)
Pt called out after vomiting brown emesis again.  Full linen change completed and EDP made aware.

## 2020-10-06 NOTE — ED Notes (Signed)
Pt having severe nausea and has emesis bag at side. Requested additional antinausea medication before oral medication administration. Messaged MD re: patient's continued nausea. Last Zofran dosage given 4 hours ago. No vomitus produced. Pt declined food tray.

## 2020-10-06 NOTE — Consult Note (Signed)
[facility] Gastroenterology Inpatient Consultation   Patient ID: Keith Carter is a 65 y.o. male.  Requesting Provider: Dr. Cinda Quest- Emergency Medicine  Date of Admission: 10/04/2020  Date of Consult: 10/06/20   Reason for Consultation: brown emesis   Patient's Chief Complaint:   Chief Complaint  Patient presents with   Weakness    Pt from home via EMS. EMS reports weakness and being called to home three rime this day. Pt states he feels find    65 year old Caucasian male with history of TIA, diabetes, hypertension who presents to the hospital for the second time this week with generalized weakness.  Gastroenterology was consulted for noted brown emesis.  Unfortunately, the patient is a vague historian so much information is garnered via chart review as well as patient interview and discussion with emergency medicine staff.  Patient reports nausea for the last 2 to 3 days.  This has been ongoing, but he has been tolerating p.o.  This morning he vomited and was noted to have brownish material per nursing and underwent a Gastroccult test which was positive.  Notably the patient has what appears to be Coca-Cola or sweet tea at bedside.  Hemoglobin has remained stable and at baseline.  He denies any NSAIDs and is currently no longer taking Plavix for the last 6 months.  He denies any dysphagia or odynophagia.  Notes that he has been having regular bowel movements without blood.  He denies any abdominal pain, specifically also denying pain at his ventral hernia site and notes this is unchanged.  He denies any history of liver disease.  Denies reflux and is not taking anything for this  Patient denies hematemesis, abdominal pain, diarrhea, constipation, melena, hematochezia, fever, chills, dysphagia, odynophagia, jaundice, heartburn/reflux.   Denies NSAIDs, antiplatelets and anticoagulants Denies family history of gastrointestinal disease and malignancy Previous Endoscopies: No previous egd.  Remote colonoscopy   Past Medical History:  Diagnosis Date   Anxiety    Dyslipidemia    Enlarged prostate    Hypertension    Prediabetes    TIA (transient ischemic attack)     Past Surgical History:  Procedure Laterality Date   BACK SURGERY     HERNIA REPAIR      No Known Allergies  Family History  Problem Relation Age of Onset   Stroke Neg Hx     Social History   Tobacco Use   Smoking status: Never   Smokeless tobacco: Never  Vaping Use   Vaping Use: Never used  Substance Use Topics   Alcohol use: Yes   Drug use: Never     Pertinent GI related history and allergies were reviewed with the patient  Review of Systems  Constitutional:  Negative for activity change, appetite change, chills, fatigue, fever and unexpected weight change.  HENT:  Negative for trouble swallowing and voice change.   Respiratory:  Negative for shortness of breath and wheezing.   Cardiovascular:  Negative for chest pain, palpitations and leg swelling.  Gastrointestinal:  Positive for nausea and vomiting. Negative for abdominal distention, abdominal pain, anal bleeding, blood in stool, constipation and diarrhea.  Musculoskeletal:  Negative for arthralgias and myalgias.  Skin:  Negative for color change and pallor.  Neurological:  Positive for weakness. Negative for dizziness and syncope.  Psychiatric/Behavioral:  The patient is not nervous/anxious.   All other systems reviewed and are negative.   Medications Home Medications No current facility-administered medications on file prior to encounter.   Current Outpatient Medications on File Prior  to Encounter  Medication Sig Dispense Refill   amLODipine (NORVASC) 5 MG tablet Take 1 tablet (5 mg total) by mouth daily. 30 tablet 0   clopidogrel (PLAVIX) 75 MG tablet Take 75 mg by mouth daily.     lisinopril (ZESTRIL) 40 MG tablet Take 40 mg by mouth at bedtime.      metFORMIN (GLUCOPHAGE-XR) 500 MG 24 hr tablet Take 1,000 mg by mouth 2 (two)  times daily.     tamsulosin (FLOMAX) 0.4 MG CAPS capsule Take 0.8 mg by mouth at bedtime.      acetaminophen (TYLENOL) 500 MG tablet Take 500 mg by mouth every 6 (six) hours as needed.      atorvastatin (LIPITOR) 40 MG tablet Take 1 tablet (40 mg total) by mouth daily at 6 PM. 30 tablet 0   Cholecalciferol 25 MCG (1000 UT) tablet Take 5,000 Units by mouth daily.      PARoxetine (PAXIL) 40 MG tablet Take 40 mg by mouth at bedtime.      Potassium 99 MG TABS Take by mouth daily.     sildenafil (REVATIO) 20 MG tablet Take 20 mg by mouth daily as needed.     Pertinent GI related medications were reviewed with the patient  Inpatient Medications  Current Facility-Administered Medications:    acetaminophen (TYLENOL) tablet 500 mg, 500 mg, Oral, Q6H PRN, Vanessa [place], MD   amLODipine (NORVASC) tablet 5 mg, 5 mg, Oral, Daily, Vanessa [place], MD, 5 mg at 10/06/20 0916   atorvastatin (LIPITOR) tablet 40 mg, 40 mg, Oral, q1800, Vanessa Biddle, MD   cholecalciferol (VITAMIN D3) tablet 5,000 Units, 5,000 Units, Oral, Daily, Vanessa Davenport, MD, 5,000 Units at 10/06/20 0950   clopidogrel (PLAVIX) tablet 75 mg, 75 mg, Oral, Daily, Vanessa [place], MD, 75 mg at 10/06/20 0916   lisinopril (ZESTRIL) tablet 40 mg, 40 mg, Oral, QHS, Vanessa Allisonia, MD, 40 mg at 10/05/20 2322   metFORMIN (GLUCOPHAGE-XR) 24 hr tablet 1,000 mg, 1,000 mg, Oral, BID WC, Vanessa [place], MD, 1,000 mg at 10/06/20 0950   pantoprazole (PROTONIX) EC tablet 40 mg, 40 mg, Oral, BID, Nena Polio, MD   PARoxetine (PAXIL) tablet 40 mg, 40 mg, Oral, QHS, Vanessa [place], MD   tamsulosin (FLOMAX) capsule 0.8 mg, 0.8 mg, Oral, QHS, Vanessa , MD, 0.8 mg at 10/05/20 2323  Current Outpatient Medications:    amLODipine (NORVASC) 5 MG tablet, Take 1 tablet (5 mg total) by mouth daily., Disp: 30 tablet, Rfl: 0   clopidogrel (PLAVIX) 75 MG tablet, Take 75 mg by mouth daily., Disp: , Rfl:    lisinopril (ZESTRIL) 40 MG tablet, Take 40 mg by mouth at  bedtime. , Disp: , Rfl:    metFORMIN (GLUCOPHAGE-XR) 500 MG 24 hr tablet, Take 1,000 mg by mouth 2 (two) times daily., Disp: , Rfl:    tamsulosin (FLOMAX) 0.4 MG CAPS capsule, Take 0.8 mg by mouth at bedtime. , Disp: , Rfl:    acetaminophen (TYLENOL) 500 MG tablet, Take 500 mg by mouth every 6 (six) hours as needed. , Disp: , Rfl:    atorvastatin (LIPITOR) 40 MG tablet, Take 1 tablet (40 mg total) by mouth daily at 6 PM., Disp: 30 tablet, Rfl: 0   Cholecalciferol 25 MCG (1000 UT) tablet, Take 5,000 Units by mouth daily. , Disp: , Rfl:    PARoxetine (PAXIL) 40 MG tablet, Take 40 mg by mouth at bedtime. , Disp: , Rfl:  Potassium 99 MG TABS, Take by mouth daily., Disp: , Rfl:    sildenafil (REVATIO) 20 MG tablet, Take 20 mg by mouth daily as needed., Disp: , Rfl:    acetaminophen   Objective   Vitals:   10/06/20 1030 10/06/20 1130 10/06/20 1230 10/06/20 1330  BP: 129/83 (!) 137/110 (!) 150/107 (!) 156/101  Pulse: 90 81 90 88  Resp: (!) 23 16 (!) 22 (!) 21  Temp:      TempSrc:      SpO2: 95% 96% 95% 94%  Weight:      Height:         Physical Exam Vitals and nursing note reviewed.  Constitutional:      General: He is not in acute distress.    Appearance: Normal appearance. He is obese. He is not toxic-appearing or diaphoretic. Ill appearance: chronically ill appearing. HENT:     Head: Normocephalic and atraumatic.     Nose: Nose normal.     Mouth/Throat:     Mouth: Mucous membranes are moist.     Pharynx: Oropharynx is clear.     Comments: Erosions/aphthous ulcers noted on tongue Eyes:     General: No scleral icterus.    Extraocular Movements: Extraocular movements intact.  Cardiovascular:     Rate and Rhythm: Normal rate and regular rhythm.     Heart sounds: Normal heart sounds. No murmur heard.   No friction rub. No gallop.  Pulmonary:     Effort: Pulmonary effort is normal. No respiratory distress.     Breath sounds: Normal breath sounds. No wheezing, rhonchi or rales.   Abdominal:     General: Abdomen is flat. Bowel sounds are normal. There is no distension.     Palpations: Abdomen is soft.     Tenderness: There is no abdominal tenderness. There is no guarding or rebound.     Hernia: A hernia (ventral, non tender, firm, but non incarcerated) is present.  Musculoskeletal:     Cervical back: Neck supple.     Right lower leg: No edema.     Left lower leg: No edema.  Skin:    General: Skin is warm and dry.     Coloration: Skin is not jaundiced or pale.  Neurological:     General: No focal deficit present.     Mental Status: He is alert and oriented to person, place, and time. Mental status is at baseline.  Psychiatric:        Mood and Affect: Mood normal.        Behavior: Behavior normal.        Thought Content: Thought content normal.        Judgment: Judgment normal.    Laboratory Data Recent Labs  Lab 09/30/20 2151 10/05/20 0011 10/06/20 1012  WBC 9.2 9.4 10.6*  HGB 14.6 14.4 15.0  HCT 42.8 42.5 44.0  PLT 193 206 206   Recent Labs  Lab 09/30/20 2151 10/05/20 0011 10/06/20 1012  NA 137 139 139  K 4.1 3.8 3.5  CL 105 106 101  CO2 '25 27 27  '$ BUN '22 17 14  '$ CALCIUM 9.2 9.2 9.0  PROT  --  6.5 6.7  BILITOT  --  1.4* 1.6*  ALKPHOS  --  86 81  ALT  --  16 14  AST  --  16 15  GLUCOSE 300* 250* 255*     Imaging Studies: CT HEAD WO CONTRAST (5MM)  Result Date: 10/05/2020 CLINICAL DATA:  Mental status change,  unknown cause EXAM: CT HEAD WITHOUT CONTRAST TECHNIQUE: Contiguous axial images were obtained from the base of the skull through the vertex without intravenous contrast. COMPARISON:  09/28/2019 FINDINGS: Brain: There is atrophy and chronic small vessel disease changes. No acute intracranial abnormality. Specifically, no hemorrhage, hydrocephalus, mass lesion, acute infarction, or significant intracranial injury. Vascular: No hyperdense vessel or unexpected calcification. Skull: No acute calvarial abnormality. Sinuses/Orbits: No acute  findings Other: None IMPRESSION: Atrophy, chronic microvascular disease. No acute intracranial abnormality. Electronically Signed   By: Rolm Baptise M.D.   On: 10/05/2020 00:10   DG Chest Portable 1 View  Result Date: 10/05/2020 CLINICAL DATA:  Weakness EXAM: PORTABLE CHEST 1 VIEW COMPARISON:  None. FINDINGS: Cardiomegaly. No confluent airspace opacities or effusions. No edema. No acute bony abnormality. IMPRESSION: Cardiomegaly.  No active disease. Electronically Signed   By: Rolm Baptise M.D.   On: 10/05/2020 00:08    Assessment:   # Nausea and vomiting  - suspect gastroenteritis, this has gone on for 2-3 days per the patient  - denies nsaid use; reports stopping plavix 6 m ago  - has been drinking dark liquids which are at bedside- tea/coca cola  - lfts and cbc wnl; normal Lactate  # gastro-occult positive - likely related to repeated vomiting and aphthous ulcerations noted on tongue - pt notes normal stools - hgb stable; bun:cr ratio 14, VSS - unlikely GI Bleeding.  # generalized weakness- pending SNF placement # dm2  #  spinal stenosis s/p surgical intervention # HTN # h/o CVA  Plan:  Hgb stable and at baseline, VSS, bun cr ratio 14 and no overt signs of GIB Would recommend protonix 40 mg po bid for several weeks if patient has been using nsaids but not reporting or if currently still using plavix No indication for upper endoscopy at this time Would not perform gastric occult testing in future as this does not impact treatment plan Supportive care including anti emetics and pain control as per primary team. OK to eat from gi perspective as tolerated. Recommend IVF if concern for patient hydration status;  Recommend Glucose control as this may contribute to his n/v and values have been >250 since arrival Patient pending snf placement Patient can follow up with GI as outpatient as needed/indicated. Chart review performed  GI to sign off. Available as needed.  I personally  performed the service.  Management of other medical comorbidities as per primary team  Thank you for allowing [place] to participate in this patient's care. Please don't hesitate to call if any questions or concerns arise.   Annamaria Helling, DO [facility]  Portions of the record may have been created with voice recognition software. Occasional wrong-word or 'sound-a-like' substitutions may have occurred due to the inherent limitations of voice recognition software.  Read the chart carefully and recognize, using context, where substitutions may have occurred.

## 2020-10-06 NOTE — ED Notes (Signed)
Patient is resting comfortably. 

## 2020-10-06 NOTE — ED Provider Notes (Signed)
Patient was stable overnight.  I did add on his home medications.    Vanessa Plevna, MD 10/06/20 [phone]

## 2020-10-06 NOTE — ED Provider Notes (Signed)
Emergency Medicine Observation Re-evaluation Note  Keith Carter is a 65 y.o. male, seen on rounds today.  Physical Exam  BP (!) 158/91   Pulse 77   Temp 98.5 F (36.9 C) (Oral)   Resp 11   Ht '6\' 1"'$  (1.854 m)   Wt 117.9 kg   SpO2 96%   BMI 34.29 kg/m  Physical Exam General: Patient resting comfortably in bed Lungs: Patient in no respiratory distress Psych: Patient not combative  ED Course / MDM  EKG: Plan  Current plan is for patient work placement  [employer] is not under involuntary commitment.     Nena Polio, MD 10/06/20 [phone]

## 2020-10-06 NOTE — ED Provider Notes (Signed)
Dr. Virgina Jock from gastroenterology stop by saw the patient.  He advises that patient could be continued on PPI, believes patient likely having some type of mild gastroenteritis type symptoms without obvious evidence of bleeding.  Advises from gastroenterology perspective patient would not require admission to the hospital at this time.  I have updated the social work team of this recommendation as well.   Delman Kitten, MD 10/06/20 1558

## 2020-10-06 NOTE — ED Notes (Signed)
Hemoccult performed by EDP on emesis and resulted positive.

## 2020-10-06 NOTE — TOC Progression Note (Signed)
Transition of Care [facility]) - Progression Note    Patient Details  Name: Keith Carter MRN: [id number] Date of Birth: [birth year]  Transition of Care [facility]) CM/SW [place] Phone Number: [phone] 10/06/2020, 11:41 AM  Clinical Narrative:     CSW spoke with patient and patient's daughter [name] [phone].  Patient has agreed to SNF placement. CSW explained SNF placement search process and possible timeline.  CSW gave Ms. Lampley Medicare.gov and Medicaid.gov information.  CSW stated I would updated patient and Ms. Dignan once I had bed offers.   Expected Discharge Plan: [employer] Barriers to Discharge: Continued Medical Work up  Expected Discharge Plan and Services Expected Discharge Plan: [place] arrangements for the past 2 months: Single Family Home                                       Social Determinants of Health (SDOH) Interventions    Readmission Risk Interventions No flowsheet data found.

## 2020-10-06 NOTE — ED Provider Notes (Addendum)
Patient is now awake.  He is vomiting.  He says he has been vomiting since 11:00 last night.  He was of course not vomiting when I had seen him earlier he was sleeping.  He denies any chest pain shortness of breath belly pain or any other problems.  He does have a large ventral hernia which is firm but not tender.  Patient says he does not feel like his hernia is bothering him at all.  He is awake alert and oriented and making sense.  I will get some lab work EKG UA etc. and make sure there is nothing that I can find causing his vomiting.  We will give him some Zofran in the meantime.   Nena Polio, MD 10/06/20 [phone] KG read interpreted by me shows normal sinus rhythm rate of 89 l rightward axis nonspecific intraventricular conduction delay similar to EKG from 2 days ago.  It is fully paced   Nena Polio, MD 10/06/20 0948 ----------------------------------------- 11:23 AM on 10/06/2020 ----------------------------------------- Labs have returned except for urine they look good.  Patient is no longer vomiting.   Nena Polio, MD 10/06/20 [phone]

## 2020-10-06 NOTE — ED Notes (Signed)
Lab called/requested.

## 2020-10-07 ENCOUNTER — Emergency Department: Payer: Medicare HMO

## 2020-10-07 DIAGNOSIS — K439 Ventral hernia without obstruction or gangrene: Secondary | ICD-10-CM | POA: Diagnosis present

## 2020-10-07 DIAGNOSIS — R262 Difficulty in walking, not elsewhere classified: Secondary | ICD-10-CM | POA: Diagnosis not present

## 2020-10-07 DIAGNOSIS — Z823 Family history of stroke: Secondary | ICD-10-CM | POA: Diagnosis not present

## 2020-10-07 DIAGNOSIS — S0101XA Laceration without foreign body of scalp, initial encounter: Secondary | ICD-10-CM | POA: Diagnosis not present

## 2020-10-07 DIAGNOSIS — F05 Delirium due to known physiological condition: Secondary | ICD-10-CM | POA: Diagnosis present

## 2020-10-07 DIAGNOSIS — R296 Repeated falls: Secondary | ICD-10-CM | POA: Diagnosis present

## 2020-10-07 DIAGNOSIS — Z66 Do not resuscitate: Secondary | ICD-10-CM | POA: Diagnosis present

## 2020-10-07 DIAGNOSIS — I2699 Other pulmonary embolism without acute cor pulmonale: Secondary | ICD-10-CM | POA: Diagnosis present

## 2020-10-07 DIAGNOSIS — Z6834 Body mass index (BMI) 34.0-34.9, adult: Secondary | ICD-10-CM | POA: Diagnosis not present

## 2020-10-07 DIAGNOSIS — Z7901 Long term (current) use of anticoagulants: Secondary | ICD-10-CM | POA: Diagnosis not present

## 2020-10-07 DIAGNOSIS — R531 Weakness: Secondary | ICD-10-CM

## 2020-10-07 DIAGNOSIS — Z515 Encounter for palliative care: Secondary | ICD-10-CM | POA: Diagnosis not present

## 2020-10-07 DIAGNOSIS — E1169 Type 2 diabetes mellitus with other specified complication: Secondary | ICD-10-CM | POA: Diagnosis present

## 2020-10-07 DIAGNOSIS — R627 Adult failure to thrive: Secondary | ICD-10-CM | POA: Diagnosis present

## 2020-10-07 DIAGNOSIS — I2602 Saddle embolus of pulmonary artery with acute cor pulmonale: Secondary | ICD-10-CM | POA: Diagnosis present

## 2020-10-07 DIAGNOSIS — Z7902 Long term (current) use of antithrombotics/antiplatelets: Secondary | ICD-10-CM | POA: Diagnosis not present

## 2020-10-07 DIAGNOSIS — Y92199 Unspecified place in other specified residential institution as the place of occurrence of the external cause: Secondary | ICD-10-CM | POA: Diagnosis not present

## 2020-10-07 DIAGNOSIS — I69392 Facial weakness following cerebral infarction: Secondary | ICD-10-CM | POA: Diagnosis not present

## 2020-10-07 DIAGNOSIS — Z79899 Other long term (current) drug therapy: Secondary | ICD-10-CM | POA: Diagnosis not present

## 2020-10-07 DIAGNOSIS — Z8673 Personal history of transient ischemic attack (TIA), and cerebral infarction without residual deficits: Secondary | ICD-10-CM | POA: Diagnosis not present

## 2020-10-07 DIAGNOSIS — I2692 Saddle embolus of pulmonary artery without acute cor pulmonale: Secondary | ICD-10-CM | POA: Diagnosis not present

## 2020-10-07 DIAGNOSIS — K922 Gastrointestinal hemorrhage, unspecified: Secondary | ICD-10-CM

## 2020-10-07 DIAGNOSIS — E119 Type 2 diabetes mellitus without complications: Secondary | ICD-10-CM | POA: Diagnosis not present

## 2020-10-07 DIAGNOSIS — E041 Nontoxic single thyroid nodule: Secondary | ICD-10-CM | POA: Diagnosis not present

## 2020-10-07 DIAGNOSIS — I10 Essential (primary) hypertension: Secondary | ICD-10-CM | POA: Diagnosis present

## 2020-10-07 DIAGNOSIS — W19XXXA Unspecified fall, initial encounter: Secondary | ICD-10-CM | POA: Diagnosis not present

## 2020-10-07 DIAGNOSIS — E1165 Type 2 diabetes mellitus with hyperglycemia: Secondary | ICD-10-CM | POA: Diagnosis present

## 2020-10-07 DIAGNOSIS — Z7189 Other specified counseling: Secondary | ICD-10-CM | POA: Diagnosis not present

## 2020-10-07 DIAGNOSIS — I82401 Acute embolism and thrombosis of unspecified deep veins of right lower extremity: Secondary | ICD-10-CM | POA: Diagnosis present

## 2020-10-07 DIAGNOSIS — I69320 Aphasia following cerebral infarction: Secondary | ICD-10-CM | POA: Diagnosis not present

## 2020-10-07 DIAGNOSIS — E785 Hyperlipidemia, unspecified: Secondary | ICD-10-CM | POA: Diagnosis present

## 2020-10-07 DIAGNOSIS — R630 Anorexia: Secondary | ICD-10-CM | POA: Diagnosis present

## 2020-10-07 DIAGNOSIS — S0990XA Unspecified injury of head, initial encounter: Secondary | ICD-10-CM | POA: Diagnosis present

## 2020-10-07 DIAGNOSIS — I160 Hypertensive urgency: Secondary | ICD-10-CM | POA: Diagnosis not present

## 2020-10-07 DIAGNOSIS — F419 Anxiety disorder, unspecified: Secondary | ICD-10-CM | POA: Diagnosis present

## 2020-10-07 DIAGNOSIS — Z7984 Long term (current) use of oral hypoglycemic drugs: Secondary | ICD-10-CM | POA: Diagnosis not present

## 2020-10-07 DIAGNOSIS — I161 Hypertensive emergency: Secondary | ICD-10-CM | POA: Diagnosis present

## 2020-10-07 DIAGNOSIS — I69322 Dysarthria following cerebral infarction: Secondary | ICD-10-CM | POA: Diagnosis not present

## 2020-10-07 DIAGNOSIS — Z20822 Contact with and (suspected) exposure to covid-19: Secondary | ICD-10-CM | POA: Diagnosis present

## 2020-10-07 DIAGNOSIS — F0280 Dementia in other diseases classified elsewhere without behavioral disturbance: Secondary | ICD-10-CM | POA: Diagnosis present

## 2020-10-07 LAB — URINALYSIS, COMPLETE (UACMP) WITH MICROSCOPIC
Glucose, UA: 100 mg/dL — AB
Hgb urine dipstick: NEGATIVE
Ketones, ur: 40 mg/dL — AB
Leukocytes,Ua: NEGATIVE
Nitrite: NEGATIVE
Protein, ur: 30 mg/dL — AB
Specific Gravity, Urine: >1.03 — ABNORMAL HIGH (ref 1.005–1.030)
pH: 5 (ref 5.0–8.0)

## 2020-10-07 LAB — BASIC METABOLIC PANEL
Anion gap: 10 (ref 5–15)
BUN: 18 mg/dL (ref 8–23)
CO2: 29 mmol/L (ref 22–32)
Calcium: 8.9 mg/dL (ref 8.9–10.3)
Chloride: 97 mmol/L — ABNORMAL LOW (ref 98–111)
Creatinine, Ser: 0.9 mg/dL (ref 0.61–1.24)
GFR, Estimated: >60 mL/min (ref >60)
Glucose, Bld: 193 mg/dL — ABNORMAL HIGH (ref 70–99)
Potassium: 3.7 mmol/L (ref 3.5–5.1)
Sodium: 136 mmol/L (ref 135–145)

## 2020-10-07 LAB — CBG MONITORING, ED: Glucose-Capillary: 191 mg/dL — ABNORMAL HIGH (ref 70–99)

## 2020-10-07 LAB — PROTIME-INR
INR: 1.1 (ref 0.8–1.2)
Prothrombin Time: 14.6 seconds (ref 11.4–15.2)

## 2020-10-07 LAB — BRAIN NATRIURETIC PEPTIDE: B Natriuretic Peptide: 145.8 pg/mL — ABNORMAL HIGH (ref 0.0–100.0)

## 2020-10-07 LAB — CBC
HCT: 44.4 % (ref 39.0–52.0)
Hemoglobin: 15.1 g/dL (ref 13.0–17.0)
MCH: 29.8 pg (ref 26.0–34.0)
MCHC: 34 g/dL (ref 30.0–36.0)
MCV: 87.6 fL (ref 80.0–100.0)
Platelets: 200 10*3/uL (ref 150–400)
RBC: 5.07 MIL/uL (ref 4.22–5.81)
RDW: 13.2 % (ref 11.5–15.5)
WBC: 11.7 10*3/uL — ABNORMAL HIGH (ref 4.0–10.5)
nRBC: 0 % (ref 0.0–0.2)

## 2020-10-07 LAB — TROPONIN I (HIGH SENSITIVITY): Troponin I (High Sensitivity): 89 ng/L — ABNORMAL HIGH (ref <18)

## 2020-10-07 LAB — APTT: aPTT: 35 seconds (ref 24–36)

## 2020-10-07 MED ORDER — HEPARIN (PORCINE) 25000 UT/250ML-% IV SOLN: 10.0000 [IU]/kg/h | INTRAVENOUS | Status: DC

## 2020-10-07 MED ORDER — HEPARIN BOLUS VIA INFUSION
7000.0000 [IU] | Freq: Once | INTRAVENOUS | Status: AC
  Administered 2020-10-07: 7000 [IU] via INTRAVENOUS
  Filled 2020-10-07: qty 7000

## 2020-10-07 MED ORDER — TRAZODONE HCL 50 MG PO TABS
25.0000 mg | ORAL_TABLET | Freq: Every evening | ORAL | Status: DC | PRN
  Administered 2020-10-07 – 2020-10-11 (×4): 25 mg via ORAL
  Filled 2020-10-07 (×4): qty 1

## 2020-10-07 MED ORDER — POTASSIUM 99 MG PO TABS: ORAL_TABLET | Freq: Every day | ORAL | Status: DC

## 2020-10-07 MED ORDER — ONDANSETRON HCL 4 MG/2ML IJ SOLN
4.0000 mg | Freq: Four times a day (QID) | INTRAMUSCULAR | Status: DC | PRN
  Administered 2020-10-07: 4 mg via INTRAVENOUS
  Filled 2020-10-07 (×2): qty 2

## 2020-10-07 MED ORDER — ONDANSETRON HCL 4 MG PO TABS: 4.0000 mg | ORAL_TABLET | Freq: Four times a day (QID) | ORAL | Status: DC | PRN

## 2020-10-07 MED ORDER — ACETAMINOPHEN 325 MG PO TABS
650.0000 mg | ORAL_TABLET | Freq: Four times a day (QID) | ORAL | Status: DC | PRN
  Administered 2020-10-09: 650 mg via ORAL
  Filled 2020-10-07: qty 2

## 2020-10-07 MED ORDER — PANTOPRAZOLE 80MG IVPB - SIMPLE MED
80.0000 mg | Freq: Once | INTRAVENOUS | Status: AC
  Administered 2020-10-07: 80 mg via INTRAVENOUS
  Filled 2020-10-07: qty 80

## 2020-10-07 MED ORDER — MAGNESIUM HYDROXIDE 400 MG/5ML PO SUSP: 30.0000 mL | Freq: Every day | ORAL | Status: DC | PRN

## 2020-10-07 MED ORDER — HEPARIN SODIUM (PORCINE) 5000 UNIT/ML IJ SOLN: 4000.0000 [IU] | Freq: Once | INTRAMUSCULAR | Status: DC

## 2020-10-07 MED ORDER — LABETALOL HCL 5 MG/ML IV SOLN
20.0000 mg | INTRAVENOUS | Status: DC | PRN
  Administered 2020-10-08: 20 mg via INTRAVENOUS
  Filled 2020-10-07: qty 4

## 2020-10-07 MED ORDER — POTASSIUM CHLORIDE 20 MEQ PO PACK
40.0000 meq | PACK | Freq: Once | ORAL | Status: AC
  Administered 2020-10-07: 40 meq via ORAL
  Filled 2020-10-07: qty 2

## 2020-10-07 MED ORDER — INSULIN ASPART 100 UNIT/ML IJ SOLN
0.0000 [IU] | Freq: Three times a day (TID) | INTRAMUSCULAR | Status: DC
  Administered 2020-10-07 – 2020-10-08 (×2): 2 [IU] via SUBCUTANEOUS
  Administered 2020-10-09: 1 [IU] via SUBCUTANEOUS
  Administered 2020-10-09 (×2): 2 [IU] via SUBCUTANEOUS
  Administered 2020-10-10 (×2): 1 [IU] via SUBCUTANEOUS
  Administered 2020-10-10 – 2020-10-11 (×3): 2 [IU] via SUBCUTANEOUS
  Administered 2020-10-12: 5 [IU] via SUBCUTANEOUS
  Filled 2020-10-07 (×11): qty 1

## 2020-10-07 MED ORDER — PANTOPRAZOLE INFUSION (NEW) - SIMPLE MED
8.0000 mg/h | INTRAVENOUS | Status: DC
  Administered 2020-10-07 – 2020-10-08 (×3): 8 mg/h via INTRAVENOUS
  Filled 2020-10-07 (×2): qty 80

## 2020-10-07 MED ORDER — HEPARIN (PORCINE) 25000 UT/250ML-% IV SOLN
1800.0000 [IU]/h | INTRAVENOUS | Status: DC
  Administered 2020-10-07: 1800 [IU]/h via INTRAVENOUS
  Administered 2020-10-08 – 2020-10-09 (×2): 1600 [IU]/h via INTRAVENOUS
  Filled 2020-10-07 (×4): qty 250

## 2020-10-07 MED ORDER — POTASSIUM CHLORIDE IN NACL 20-0.9 MEQ/L-% IV SOLN
INTRAVENOUS | Status: DC
  Filled 2020-10-07 (×8): qty 1000

## 2020-10-07 MED ORDER — IOHEXOL 350 MG/ML SOLN
100.0000 mL | Freq: Once | INTRAVENOUS | Status: AC | PRN
  Administered 2020-10-07: 100 mL via INTRAVENOUS

## 2020-10-07 MED ORDER — PANTOPRAZOLE SODIUM 40 MG IV SOLR: 40.0000 mg | Freq: Two times a day (BID) | INTRAVENOUS | Status: DC

## 2020-10-07 MED ORDER — ACETAMINOPHEN 650 MG RE SUPP: 650.0000 mg | Freq: Four times a day (QID) | RECTAL | Status: DC | PRN

## 2020-10-07 NOTE — ED Notes (Signed)
Pt. Sleeping. Pos chest rise and fall, NAD.

## 2020-10-07 NOTE — ED Notes (Signed)
Dr. Mansy at the bedside. 

## 2020-10-07 NOTE — ED Notes (Signed)
Trop--89, Dr. Sidney Ace notified and aware

## 2020-10-07 NOTE — TOC Progression Note (Addendum)
Transition of Care [facility]) - Progression Note    Patient Details  Name: Rodeny Deuschle MRN: [id number] Date of Birth: 08/14/1955  Transition of Care [facility]) CM/SW [place], [place] Phone Number: [phone] 10/07/2020, 8:56 AM  Clinical Narrative:     CSW contacted patient's daughter Jonanthony Libertini [phone] and updated her on bed offers.  Ms. Schwindt stated she will contact me later this morning with a decision.  CSW contacted Tammy at [employer], she state she will look at the patient's paperwork and update me on bed offer.   Expected Discharge Plan: [place] Barriers to Discharge: Continued Medical Work up  Expected Discharge Plan and Services Expected Discharge Plan: [place] arrangements for the past 2 months: Single Family Home                                       Social Determinants of Health (SDOH) Interventions    Readmission Risk Interventions No flowsheet data found.

## 2020-10-07 NOTE — ED Notes (Signed)
Wife and neighbor informed of next plans for pt and placement for pt to [facility] tomorrow.

## 2020-10-07 NOTE — ED Provider Notes (Signed)
Emergency Medicine Observation Re-evaluation Note  Keith Carter is a 65 y.o. male, seen on rounds today.  Pt initially presented to the ED for complaints of Weakness (Pt from home via EMS. EMS reports weakness and being called to home three rime this day. Pt states he feels find) Currently, the patient is resting comfortably and denies any acute complaints.  Physical Exam  BP (!) 141/88 (BP Location: Left Arm)   Pulse 88   Temp 99.7 F (37.6 C) (Oral)   Resp 18   Ht '6\' 1"'$  (1.854 m)   Wt 117.9 kg   SpO2 95%   BMI 34.29 kg/m  Physical Exam Gen: No acute distress  Resp: Normal rise and fall of chest Neuro: Moving all four extremities Psych: Resting currently, calm and cooperative when awake    ED Course / MDM  EKG:EKG Interpretation  Date/Time:  Tuesday October 06 2020 09:33:19 EDT Ventricular Rate:  89 PR Interval:  161 QRS Duration: 145 QT Interval:  394 QTC Calculation: 483 R Axis:   212 Text Interpretation: Sinus rhythm Nonspecific intraventricular conduction delay Abnormal lateral Q waves Confirmed by UNCONFIRMED, DOCTOR ([id number]), editor Mel Almond, Tammy [phone]) on 10/06/2020 10:31:27 AM  I have reviewed the labs performed to date as well as medications administered while in observation.  Recent changes in the last 24 hours include no acute events.  Plan  Current plan is for social work disposition.  Keith Carter is not under involuntary commitment.     Charlita Brian, Delice Bison, DO 10/07/20 [phone]

## 2020-10-07 NOTE — ED Notes (Signed)
Pt. Sleeping, pos. Chest rise and fall, NAD.

## 2020-10-07 NOTE — ED Notes (Signed)
Full bed linen change and bed bath given to patient. Patient sitting up with lunch tray. Patient has redness noted to scrotum- barrier cream applied.

## 2020-10-07 NOTE — ED Notes (Signed)
Patients daughter updated on new results and patient now being admitted to hospital. Daughter requests to be updated with any changes.

## 2020-10-07 NOTE — Progress Notes (Signed)
Physical Therapy Treatment Patient Details Name: Keith Carter MRN: [id number] DOB: 1955-04-14 Today's Date: 10/07/2020    History of Present Illness Keith Carter is a 65 y.o. 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  Per EMS, they have been called out to his house several times over the last week to help him move from the bed to chair and vice versa.    PT Comments    Pt making limited progress towards therapy goals. Pt continues to have difficulty with transfers and ambulation in room requiring min-modA for patient safety. Pt demonstrating decreased awareness of his deficits and safety awareness. 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 Pt agreeable to SNF recommendation after discussion regarding current functional status/limitations. Pt will continue to benefit from skilled PT services to improve overall functional mobility and strength.   Follow Up Recommendations  SNF     Equipment Recommendations  Other (comment) (TBD)    Recommendations for Other Services       Precautions / Restrictions Precautions Precautions: Fall Restrictions Weight Bearing Restrictions: No    Mobility  Bed Mobility Overal bed mobility: Needs Assistance Bed Mobility: Supine to Sit     Supine to sit: Min assist     General bed mobility comments: Pt able to manage B LEs off EOB and cued for usage of bed rails to promote independence. Pt requires additional time for all bed mobility tasks. Pt demonstrating difficulty with sitting balance with sitting EOB with posterior bias. Required cues and minA for improved balance and posture    Transfers Overall transfer level: Needs assistance Equipment used: Rolling walker (2 wheeled) Transfers: Sit  to/from Stand Sit to Stand: Min assist;From elevated surface         General transfer comment: Pt needs minA from elevated surface with verbal cues for hand placement and full upright posture  Ambulation/Gait Ambulation/Gait assistance: Min assist Gait Distance (Feet): 10 Feet Assistive device: Rolling walker (2 wheeled) Gait Pattern/deviations: Step-to pattern;Decreased stride length Gait velocity: slow   General Gait Details: Pt ambulated in room with minA for balance. Pt demonstrates forward flexed posture and heavy UE assistance on RW. Pt unable to increase stride length or gait speed with cues.   Stairs             Wheelchair Mobility    Modified Rankin (Stroke Patients Only)       Balance Overall balance assessment: History of Falls;Needs assistance Sitting-balance support: Feet supported;Bilateral upper extremity supported Sitting balance-Leahy Scale: Fair Sitting balance - Comments: Pt unable to maintain seated balance at EOB without assistance from B UEs.   Standing balance support: Bilateral upper extremity supported Standing balance-Leahy Scale: Poor Standing balance comment: Pt demonstrated decreased dynamic balance with ambulation requiring minA for safety and verbal cues for management of device         Cognition Arousal/Alertness: Awake/alert Behavior During Therapy: Flat affect Overall Cognitive Status: Impaired/Different from baseline        General Comments: Pt appears to be confused regarding his current functional status and speech is more difficult to understand      Exercises Other Exercises Other Exercises: Supine: Heel slides bilat 1 x 10, hip abduction 1 x 10 bilat. Seated therex: LAQ 1 x 10. Sit to stands  x 5 with verbal cues for hand placement and forward lean. Standing marching 1 x 5 bilaterally with RW    General Comments        Pertinent Vitals/Pain Pain Assessment: No/denies pain    Home Living                       Prior Function            PT Goals (current goals can now be found in the care plan section) Acute Rehab PT Goals Patient Stated Goal: To go home to be caregiver for wife PT Goal Formulation: With patient Time For Goal Achievement: 10/19/20 Potential to Achieve Goals: Good Progress towards PT goals: Progressing toward goals    Frequency    Min 2X/week      PT Plan Current plan remains appropriate    Co-evaluation              AM-PAC PT "6 Clicks" Mobility   Outcome Measure  Help needed turning from your back to your side while in a flat bed without using bedrails?: A Little Help needed moving from lying on your back to sitting on the side of a flat bed without using bedrails?: A Little Help needed moving to and from a bed to a chair (including a wheelchair)?: A Little Help needed standing up from a chair using your arms (e.g., wheelchair or bedside chair)?: A Little Help needed to walk in hospital room?: A Lot Help needed climbing 3-5 steps with a railing? : Total 6 Click Score: 15    End of Session Equipment Utilized During Treatment: Gait belt Activity Tolerance: Patient tolerated treatment well Patient left: in bed;with call bell/phone within reach Nurse Communication: Mobility status;Other (comment) (Request to speak with his wife) PT Visit Diagnosis: Muscle weakness (generalized) (M62.81);History of falling (Z91.81);Repeated falls (R29.6);Difficulty in walking, not elsewhere classified (R26.2)     Time: 1332-1400 PT Time Calculation (min) (ACUTE ONLY): 28 min  Charges:  $Gait Training: 8-22 mins $Therapeutic Exercise: 8-22 mins                     Andrey Campanile, SPT   Andrey Campanile 10/07/2020, 4:36 PM

## 2020-10-07 NOTE — TOC Progression Note (Signed)
Transition of Care [facility]) - Progression Note    Patient Details  Name: Keith Carter MRN: [id number] Date of Birth: 1955-06-19  Transition of Care [facility]) CM/SW [place], [place] Phone Number: [phone] 10/07/2020, 3:26 PM  Clinical Narrative:     Patient's daughter Reco Iseminger [id number] and patient's spouse Laydon, [place] [phone] [facility] Phone), have chosen [facility]. CSW contact Juliann Pulse w/ admission and she will start authorization process.  Once insurance sends authorization, CSW will update ED Staff to set up transportation.  Expected Discharge Plan: [place] Barriers to Discharge: Continued Medical Work up  Expected Discharge Plan and Services Expected Discharge Plan: [place] arrangements for the past 2 months: Single Family Home                                       Social Determinants of Health (SDOH) Interventions    Readmission Risk Interventions No flowsheet data found.

## 2020-10-07 NOTE — ED Provider Notes (Addendum)
CT returned showing a submassive PE.  I am paging for vascular and pulmonary see if this patient would be a candidate for transfer to [facility] or for [facility] or other clot busting drug IV here.   Nena Polio, MD 10/07/20 1901 Vascular says to put him on heparin and get him admitted and they will do a thrombectomy tomorrow.  Patient is hemodynamically stable at this time.   Nena Polio, MD 10/07/20 [phone]

## 2020-10-07 NOTE — Progress Notes (Addendum)
[place] for IV heparin Indication: pulmonary embolus  No Known Allergies  Patient Measurements: Height: '6\' 1"'$  (185.4 cm) Weight: 117.9 kg (259 lb 14.8 oz) IBW/kg (Calculated) : 79.9 Heparin Dosing Weight: 105.3 kg  Vital Signs: Temp: 99.8 F (37.7 C) (09/07 1706) Temp Source: Oral (09/07 0729) BP: 149/106 (09/07 1850) Pulse Rate: 84 (09/07 1850)  Labs: Recent Labs    10/05/20 0011 10/05/20 0117 10/06/20 1012  HGB 14.4  --  15.0  HCT 42.5  --  44.0  PLT 206  --  206  CREATININE 1.10  --  1.03  TROPONINIHS 20* 20* 12    Estimated Creatinine Clearance: 93.6 mL/min (by C-G formula based on SCr of 1.03 mg/dL).   Medical History: Past Medical History:  Diagnosis Date   Anxiety    Dyslipidemia    Enlarged prostate    Hypertension    Prediabetes    TIA (transient ischemic attack)     Medications:  (Not in a hospital admission)  Scheduled:   amLODipine  5 mg Oral Daily   atorvastatin  40 mg Oral q1800   cholecalciferol  5,000 Units Oral Daily   clopidogrel  75 mg Oral Daily   heparin  4,000 Units Intravenous Once   lisinopril  40 mg Oral QHS   metFORMIN  1,000 mg Oral BID WC   pantoprazole  40 mg Oral BID   PARoxetine  40 mg Oral QHS   tamsulosin  0.8 mg Oral QHS   Infusions:   heparin     PRN: acetaminophen, ondansetron Anti-infectives (From admission, onward)    None       Assessment: 65YOM with pertinent cardiac PMH including HTN, TIA and CVA presenting with weakness on 8/31. Today during ED course, patient had O2 sats in the upper 80%, as well as vomiting. CT scan ordered which was positive for "large saddle embolus extending into lobar, segmental and subsegmental sized branches in the lungs Bilaterally" and submassive PE. Per notes, plan is to start heparin now and then undergo thrombectomy tomorrow on 9/8.   Goal of Therapy:  Heparin level 0.3-0.7 units/ml Monitor platelets by anticoagulation protocol:  Yes   Plan:  Give 7000 units bolus x 1 Start heparin infusion at 1800 units/hr Check anti-Xa level in 6 hours and daily while on heparin Baseline aPTT, INR Daily CBC  Wynelle Cleveland, PharmD Pharmacy Resident  10/07/2020 7:16 PM

## 2020-10-07 NOTE — ED Notes (Signed)
Pt. Sleeping, pos chest rise and fall, NAD.

## 2020-10-07 NOTE — ED Provider Notes (Signed)
Patient has O2 sats that are frequently in upper 80s or low 90s.  They go up when he takes some deep breaths.  He has had a temperature of 99 for the last couple days.  He has been vomiting a lot although not for the last few hours.  He is not hungry and does not want to eat.  I am not sure if part of his problem is just being depressed that being stuck in the room for several days or if there is something more serious going on.  He did have a Hemoccult positive vomitus yesterday.  He last vomited this morning.  He denies any abdominal pain when I see him.  His lungs sound clear although he is not taking deep breaths.  It seems to me that the hard spot in his abdomen from the ventral hernia that he has had for he says years seems to be larger today than it was yesterday.  I will get a CT scan and make sure he is not having some partial obstruction or low-grade pneumonia or something going on that we have not been able to find yet.  He just does not look as healthy as I would like and I am not sure why his sats are low etc.   Nena Polio, MD 10/07/20 [phone]

## 2020-10-07 NOTE — H&P (Signed)
[place]   PATIENT NAME: Keith Carter    MR#:  [id number]  DATE OF BIRTH:  1955/02/21  DATE OF ADMISSION:  10/04/2020  PRIMARY CARE PHYSICIAN: [name], NP   Patient is coming from: Home  REQUESTING/REFERRING PHYSICIAN: Conni Slipper, MD  CHIEF COMPLAINT:   Chief Complaint  Patient presents with  . Weakness    Pt from home via EMS. EMS reports weakness and being called to home three rime this day. Pt states he feels find    HISTORY OF PRESENT ILLNESS:  Keith Carter is a 65 y.o. Caucasian male with medical history significant for hypertension, BPH,  dyslipidemia, and anxiety, TIA and DM2, who presented to the ER with acute onset of generalized weakness and not being able to get around.  He presented to the ER on 9/4 after EMS was called several times this sounds over the last week prior to that to help him move from the bed to a chair and vice versa.  He was also seen in the ED several days prior to that with increased generalized weakness.  He stated that he has been feeling worse and got actually better than.  He has intermittent chronic low back pain.  He has been monitored in the ER over the last 3 days to arrange for social worker disposition and SNF placement.  He later started to have recurrent vomiting from 9/5 evening at 11 PM.  Denies any chest pain or dyspnea or abdominal pain then.  He was given IV Zofran and his EKG showed normal sinus rhythm with rate of 89 with right axis deviation and nonspecific interventricular conduction delay similar to the EKG that was performed 2 days prior to that.  He was fully paced.  He never stopped vomiting.  He was seen by Dr. Mordecai Rasmussen with GI on 9/6.  Who recommended Protonix 40 mg p.o. twice daily especially if his taking Lasix.  There was no indication for upper endoscopy.  The patient then started dropping his pulse oximetry to the upper 80s or low 90s and that was improving with deep breathing.  He was noted to have  a temperature of 99 for the last couple days.  Yesterday he had a positive Gastroccult.  A decision was made to check a chest CTA that showed a submassive PE.  Contact was made with Dr. 2 with vascular and he recommended placing him on IV heparin and admitting him here for plan for thrombectomy tomorrow.  ED Course: His latest vital signs showed a BP of 145/105 and later 160/94 with a pulse currently 92% on 2 L of O2 by nasal cannula.  Labs revealed borderline potassium of 3.5 yesterday and blood glucose of 255 with albumin of 3.4 and CBC showing hemoglobin of 15 and hematocrit 44 with WBC of 10.6.  High-sensitivity troponin I was 12 and UA showed high specific gravity more than 1030 and was otherwise unremarkable.   EKG as reviewed by me : Latest EKG showed normal sinus rhythm with a rate of 89 and Intraventricular conduction delay Imaging: Chest abdomen pelvic CT scan revealed the following: 1. Study is positive for a large saddle embolus extending into lobar, segmental and subsegmental sized branches in the lungs bilaterally. There is also CT evidence of right heart strain (RV/LV Ratio = 1.24) consistent with at least submassive (intermediate risk) PE. The presence of right heart strain has been associated with an increased risk of morbidity and mortality. The presence  of right heart strain has been associated with an increased risk of morbidity and mortality.  2. Trace right pleural effusion lying dependently. 3. No other acute findings are noted elsewhere in the chest, abdomen or pelvis. 4. Unusual fluid collection in the anterior aspect of the abdomen which appears to be associated with and operative mesh, favored to represent a large postoperative seroma associated with mesh for prior attempted repair of ventral hernia. Immediately adjacent to this there is a recurrent ventral hernia which contains a short segment of small bowel. No evidence of associated bowel incarceration or  obstruction at this time. 5. 7 mm nonobstructive calculus in the lower pole collecting system of left kidney. 6. Colonic diverticulosis without evidence of acute diverticulitis at this time.  The patient was ordered IV heparin bolus and drip.  I recommended IV Protonix bolus and drip which are being started.  We will obtain a BNP and troponin I.  for prognostic evaluation.  The patient will be admitted to stepdown unit bed for further evaluation and management.Marland Kitchen PAST MEDICAL HISTORY:   Past Medical History:  Diagnosis Date  . Anxiety   . Dyslipidemia   . Enlarged prostate   . Hypertension   . Prediabetes   . TIA (transient ischemic attack)   -Spinal stenosis - Degenerative disc disease  PAST SURGICAL HISTORY:   Past Surgical History:  Procedure Laterality Date  . BACK SURGERY    . HERNIA REPAIR      SOCIAL HISTORY:   Social History   Tobacco Use  . Smoking status: Never  . Smokeless tobacco: Never  Substance Use Topics  . Alcohol use: Yes    FAMILY HISTORY:   Family History  Problem Relation Age of Onset  . Stroke Neg Hx     DRUG ALLERGIES:  No Known Allergies  REVIEW OF SYSTEMS:   ROS As per history of present illness. All pertinent systems were reviewed above. Constitutional, HEENT, cardiovascular, respiratory, GI, GU, musculoskeletal, neuro, psychiatric, endocrine, integumentary and hematologic systems were reviewed and are otherwise negative/unremarkable except for positive findings mentioned above in the HPI.   MEDICATIONS AT HOME:   Prior to Admission medications   Medication Sig Start Date End Date Taking? Authorizing Provider  amLODipine (NORVASC) 5 MG tablet Take 1 tablet (5 mg total) by mouth daily. 10/02/19  Yes Amin, Jeanella Flattery, MD  clopidogrel (PLAVIX) 75 MG tablet Take 75 mg by mouth daily. 10/11/19  Yes [provider]  lisinopril (ZESTRIL) 40 MG tablet Take 40 mg by mouth at bedtime.  03/10/19  Yes [provider]  metFORMIN  (GLUCOPHAGE-XR) 500 MG 24 hr tablet Take 1,000 mg by mouth 2 (two) times daily. 03/10/19  Yes [provider]  tamsulosin (FLOMAX) 0.4 MG CAPS capsule Take 0.8 mg by mouth at bedtime.  02/26/19  Yes [provider]  acetaminophen (TYLENOL) 500 MG tablet Take 500 mg by mouth every 6 (six) hours as needed.     [provider]  atorvastatin (LIPITOR) 40 MG tablet Take 1 tablet (40 mg total) by mouth daily at 6 PM. 04/05/19   Loletha Grayer, MD  Cholecalciferol 25 MCG (1000 UT) tablet Take 5,000 Units by mouth daily.     [provider]  PARoxetine (PAXIL) 40 MG tablet Take 40 mg by mouth at bedtime.  02/21/19   [provider]  Potassium 99 MG TABS Take by mouth daily.    [provider]  sildenafil (REVATIO) 20 MG tablet Take 20 mg by  mouth daily as needed. 12/16/18   [provider]      VITAL SIGNS:  Blood pressure (!) 145/105, pulse 81, temperature 99.8 F (37.7 C), resp. rate 16, height '6\' 1"'$  (1.854 m), weight 117.9 kg, SpO2 92 %.  PHYSICAL EXAMINATION:  Physical Exam  GENERAL:  65 y.o.-year-old Caucasian male patient lying in the bed with no acute distress.  EYES: Pupils equal, round, reactive to light and accommodation. No scleral icterus. Extraocular muscles intact.  HEENT: Head atraumatic, normocephalic. Oropharynx and nasopharynx clear.  NECK:  Supple, no jugular venous distention. No thyroid enlargement, no tenderness.  LUNGS: Normal breath sounds bilaterally, no wheezing, rales,rhonchi or crepitation. No use of accessory muscles of respiration.  CARDIOVASCULAR: Regular rate and rhythm, S1, S2 normal. No murmurs, rubs, or gallops.  ABDOMEN: Soft, nondistended, nontender. Bowel sounds present. No organomegaly but he has a palpable nontender ventral reducible hernia. EXTREMITIES: No pedal edema, cyanosis, or clubbing.  NEUROLOGIC: Cranial nerves II through XII are intact. Muscle strength 5/5 in all extremities. Sensation  intact. Gait not checked.  PSYCHIATRIC: The patient is alert and oriented x 3.  Normal affect and good eye contact. SKIN: No obvious rash, lesion, or ulcer.   LABORATORY PANEL:   CBC Recent Labs  Lab 10/06/20 1012  WBC 10.6*  HGB 15.0  HCT 44.0  PLT 206   ------------------------------------------------------------------------------------------------------------------  Chemistries  Recent Labs  Lab 10/06/20 1012  NA 139  K 3.5  CL 101  CO2 27  GLUCOSE 255*  BUN 14  CREATININE 1.03  CALCIUM 9.0  AST 15  ALT 14  ALKPHOS 81  BILITOT 1.6*   ------------------------------------------------------------------------------------------------------------------  Cardiac Enzymes No results for input(s): TROPONINI in the last 168 hours. ------------------------------------------------------------------------------------------------------------------  RADIOLOGY:  CT CHEST ABDOMEN PELVIS W CONTRAST  Result Date: 10/07/2020 CLINICAL DATA:  65 year old male with history of nausea and vomiting. EXAM: CT CHEST, ABDOMEN, AND PELVIS WITH CONTRAST TECHNIQUE: Multidetector CT imaging of the chest, abdomen and pelvis was performed following the standard protocol during bolus administration of intravenous contrast. CONTRAST:  153m OMNIPAQUE IOHEXOL 350 MG/ML SOLN COMPARISON:  No priors. FINDINGS: CT CHEST FINDINGS Cardiovascular: There is a large filling defect extending from the distal pulmonic trunk into both of the main pulmonary arteries bilaterally, as well as several lobar, segmental and subsegmental sized branches, indicative of pulmonary embolism. Heart size is enlarged with right atrial and right ventricular dilatation. Right ventricular diameter estimated at 57 mm. Left ventricular diameter estimated at 46 mm. RV to LV ratio of 1.24. There is no significant pericardial fluid, thickening or pericardial calcification. There is aortic atherosclerosis, as well as atherosclerosis of the great  vessels of the mediastinum and the coronary arteries, including calcified atherosclerotic plaque in the left anterior descending, left circumflex and right coronary arteries. Calcifications of the aortic valve. Mediastinum/Nodes: No pathologically enlarged mediastinal or hilar lymph nodes. Esophagus is unremarkable in appearance. No axillary lymphadenopathy. Lungs/Pleura: No acute consolidative airspace disease. Trace right pleural effusion lying dependently. No pneumothorax. No definite suspicious appearing pulmonary nodules or masses are noted. Musculoskeletal: Healing nondisplaced fractures of the anterolateral aspect of the left fifth and sixth ribs. There are no aggressive appearing lytic or blastic lesions noted in the visualized portions of the skeleton. CT ABDOMEN PELVIS FINDINGS Hepatobiliary: No suspicious cystic or solid hepatic lesions. No intra or extrahepatic biliary ductal dilatation. Gallbladder is normal in appearance. Pancreas: No pancreatic mass. No pancreatic ductal dilatation. No pancreatic or peripancreatic fluid collections or inflammatory changes. Spleen: Unremarkable. Adrenals/Urinary Tract:  7 mm nonobstructive calculus in the lower pole collecting system of the left kidney. Multiple low-attenuation lesions in the left kidney, compatible with a combination of simple cysts and parapelvic cysts, largest of which is exophytic in the upper pole of the left kidney measuring up to 2.7 cm in diameter. No hydroureteronephrosis. Urinary bladder is nearly completely decompressed, but otherwise unremarkable in appearance. Bilateral adrenal glands are normal in appearance. Stomach/Bowel: The appearance of the stomach is normal. No pathologic dilatation of small bowel or colon. Numerous colonic diverticulae are noted, particularly in the distal descending colon and proximal sigmoid colon, without surrounding inflammatory changes to suggest an acute diverticulitis at this time. The appendix is not  confidently identified and may be surgically absent. Regardless, there are no inflammatory changes noted adjacent to the cecum to suggest the presence of an acute appendicitis at this time. Vascular/Lymphatic: Aortic atherosclerosis, without evidence of aneurysm or dissection in the abdominal or pelvic vasculature. No lymphadenopathy noted in the abdomen or pelvis. Reproductive: Prostate gland and seminal vesicles are unremarkable in appearance. Other: There is an unusual complex fluid density structure which has what appears to be an operative mass associated within the anterior aspect of the abdomen intimately associated with the anterior abdominal wall (axial image 79 of series 2 and sagittal image 94 of series 6) estimated to measure approximately 16.4 x 9.7 x 14.1 cm, favored to represent a chronic postoperative seroma associated with a now misplaced operative mash, likely for attempted repair of ventral hernia. Immediately to the left of this there is a small ventral hernia containing short segments of mid small bowel. No significant volume of ascites. No pneumoperitoneum. Musculoskeletal: Status post PLIF from L2-S1 with interbody cages at L2-L3, L3-L4, L4-L5 and L5-S1. There are no aggressive appearing lytic or blastic lesions noted in the visualized portions of the skeleton. IMPRESSION: 1. Study is positive for a large saddle embolus extending into lobar, segmental and subsegmental sized branches in the lungs bilaterally. There is also CT evidence of right heart strain (RV/LV Ratio = 1.24) consistent with at least submassive (intermediate risk) PE. The presence of right heart strain has been associated with an increased risk of morbidity and mortality. The presence of right heart strain has been associated with an increased risk of morbidity and mortality. Please refer to the "PE Focused" order set in EPIC. 2. Trace right pleural effusion lying dependently. 3. No other acute findings are noted elsewhere in  the chest, abdomen or pelvis. 4. Unusual fluid collection in the anterior aspect of the abdomen which appears to be associated with and operative mesh, favored to represent a large postoperative seroma associated with mesh for prior attempted repair of ventral hernia. Immediately adjacent to this there is a recurrent ventral hernia which contains a short segment of small bowel. No evidence of associated bowel incarceration or obstruction at this time. 5. 7 mm nonobstructive calculus in the lower pole collecting system of left kidney. 6. Colonic diverticulosis without evidence of acute diverticulitis at this time. 7. Additional incidental findings, as above. Critical Value/emergent results were called by telephone at the time of interpretation on 10/07/2020 at 6:42 pm to provider [facility], who verbally acknowledged these results. Electronically Signed   By: Vinnie Langton M.D.   On: 10/07/2020 18:44      IMPRESSION AND PLAN:  Active Problems:   Acute massive pulmonary embolism ([place])  1.  Acute bilateral saddle pulmonary embolism. - The patient will be admitted to stepdown unit bed. - We  will continue him on IV heparin per protocol. - Vascular surgery consult will obtain and Dr. Lucky Cowboy will be following. - The plan is for thrombectomy tomorrow.  2.  GI bleeding with associated vomiting. - This could be related to acute gastritis/duodenitis, duodenal or gastric erosions or AVMs. - The patient will be placed on IV Protonix drip after given bolus. - We will follow serial hemoglobins and hematocrits. - A GI consultation will be obtained. - I notified Dr. Alice Reichert about the patient.  3.  Hypertensive urgency. - We will continue his antihypertensives and place him on as needed IV labetalol.  4.  Uncontrolled type  2 diabetes mellitus with hyperglycemia. - The patient will be placed on supplement coverage with NovoLog. - We will hold off of his Glucophage XR.  5.  BPH. - We will continue statin  therapy.  DVT prophylaxis: IV heparin. Code Status: The patient is DNR/DNI.   Family Communication:  The plan of care was discussed in details with the patient (and family). I answered all questions. The patient agreed to proceed with the above mentioned plan. Further management will depend upon hospital course. Disposition Plan: Back to previous home environment Consults called: Vascular surgery and gastroenterology. All the records are reviewed and case discussed with ED provider.  Status is: Inpatient  Remains inpatient appropriate because:Hemodynamically unstable, Ongoing diagnostic testing needed not appropriate for outpatient work up, Unsafe d/c plan, IV treatments appropriate due to intensity of illness or inability to take PO, and Inpatient level of care appropriate due to severity of illness  Dispo: The patient is from: SNF              Anticipated d/c is to: Home              Patient currently is not medically stable to d/c.   Difficult to place patient Yes  TOTAL TIME TAKING CARE OF THIS PATIENT: 60 minutes.    Christel Mormon M.D on 10/07/2020 at 8:04 PM  [facility]   From 7 PM-7 AM, contact night-coverage [email or web address]  CC: Primary care physician; Gauger, Victoriano Lain, NP

## 2020-10-07 NOTE — ED Notes (Signed)
Care transferred, report received from [place], [place]

## 2020-10-08 ENCOUNTER — Inpatient Hospital Stay: Payer: Medicare HMO

## 2020-10-08 ENCOUNTER — Encounter: Payer: Self-pay | Admitting: Family Medicine

## 2020-10-08 ENCOUNTER — Encounter
Admission: EM | Disposition: A | Discharge status: Skilled Nursing Facility | Payer: Self-pay | Source: Home / Self Care | Attending: Internal Medicine

## 2020-10-08 DIAGNOSIS — I161 Hypertensive emergency: Secondary | ICD-10-CM

## 2020-10-08 DIAGNOSIS — E785 Hyperlipidemia, unspecified: Secondary | ICD-10-CM

## 2020-10-08 DIAGNOSIS — I2692 Saddle embolus of pulmonary artery without acute cor pulmonale: Secondary | ICD-10-CM

## 2020-10-08 DIAGNOSIS — R627 Adult failure to thrive: Secondary | ICD-10-CM

## 2020-10-08 DIAGNOSIS — I2699 Other pulmonary embolism without acute cor pulmonale: Secondary | ICD-10-CM

## 2020-10-08 DIAGNOSIS — E1169 Type 2 diabetes mellitus with other specified complication: Secondary | ICD-10-CM

## 2020-10-08 DIAGNOSIS — R262 Difficulty in walking, not elsewhere classified: Secondary | ICD-10-CM

## 2020-10-08 DIAGNOSIS — R109 Unspecified abdominal pain: Secondary | ICD-10-CM

## 2020-10-08 HISTORY — PX: PULMONARY THROMBECTOMY: CATH118295

## 2020-10-08 LAB — HEMOGLOBIN AND HEMATOCRIT, BLOOD
HCT: 39.9 % (ref 39.0–52.0)
HCT: 41.5 % (ref 39.0–52.0)
Hemoglobin: 13.6 g/dL (ref 13.0–17.0)
Hemoglobin: 14.2 g/dL (ref 13.0–17.0)

## 2020-10-08 LAB — GLUCOSE, CAPILLARY
Glucose-Capillary: 221 mg/dL — ABNORMAL HIGH (ref 70–99)
Glucose-Capillary: 154 mg/dL — ABNORMAL HIGH (ref 70–99)
Glucose-Capillary: 193 mg/dL — ABNORMAL HIGH (ref 70–99)

## 2020-10-08 LAB — BASIC METABOLIC PANEL
Anion gap: 7 (ref 5–15)
BUN: 18 mg/dL (ref 8–23)
CO2: 28 mmol/L (ref 22–32)
Calcium: 8.4 mg/dL — ABNORMAL LOW (ref 8.9–10.3)
Chloride: 100 mmol/L (ref 98–111)
Creatinine, Ser: 1.01 mg/dL (ref 0.61–1.24)
GFR, Estimated: >60 mL/min (ref >60)
Glucose, Bld: 179 mg/dL — ABNORMAL HIGH (ref 70–99)
Potassium: 3.6 mmol/L (ref 3.5–5.1)
Sodium: 135 mmol/L (ref 135–145)

## 2020-10-08 LAB — HEPARIN LEVEL (UNFRACTIONATED)
Heparin Unfractionated: 0.94 IU/mL — ABNORMAL HIGH (ref 0.30–0.70)
Heparin Unfractionated: <0.1 IU/mL — ABNORMAL LOW (ref 0.30–0.70)
Heparin Unfractionated: 0.49 IU/mL (ref 0.30–0.70)

## 2020-10-08 LAB — SARS CORONAVIRUS 2 (TAT 6-24 HRS): SARS Coronavirus 2: NEGATIVE

## 2020-10-08 LAB — HIV ANTIBODY (ROUTINE TESTING W REFLEX): HIV Screen 4th Generation wRfx: NONREACTIVE

## 2020-10-08 LAB — HEMOGLOBIN A1C
Hgb A1c MFr Bld: 9 % — ABNORMAL HIGH (ref 4.8–5.6)
Mean Plasma Glucose: 211.6 mg/dL

## 2020-10-08 SURGERY — PULMONARY THROMBECTOMY
Anesthesia: Moderate Sedation

## 2020-10-08 MED ORDER — PANTOPRAZOLE SODIUM 40 MG IV SOLR
40.0000 mg | Freq: Two times a day (BID) | INTRAVENOUS | Status: DC
  Administered 2020-10-08 – 2020-10-09 (×2): 40 mg via INTRAVENOUS
  Filled 2020-10-08 (×2): qty 40

## 2020-10-08 MED ORDER — CEFAZOLIN SODIUM-DEXTROSE 2-4 GM/100ML-% IV SOLN
2.0000 g | Freq: Once | INTRAVENOUS | Status: AC
  Administered 2020-10-08: 2 g via INTRAVENOUS

## 2020-10-08 MED ORDER — SODIUM CHLORIDE 0.9 % IV SOLN: 12.5000 mg | INTRAVENOUS | Status: DC

## 2020-10-08 MED ORDER — MIDAZOLAM HCL 2 MG/2ML IJ SOLN
INTRAMUSCULAR | Status: DC | PRN
  Administered 2020-10-08: 2 mg via INTRAVENOUS

## 2020-10-08 MED ORDER — MIDAZOLAM HCL 2 MG/2ML IJ SOLN
INTRAMUSCULAR | Status: AC
  Filled 2020-10-08: qty 4

## 2020-10-08 MED ORDER — ONDANSETRON HCL 4 MG/2ML IJ SOLN
INTRAMUSCULAR | Status: AC
  Administered 2020-10-08: 4 mg via INTRAVENOUS
  Filled 2020-10-08: qty 2

## 2020-10-08 MED ORDER — HEPARIN SODIUM (PORCINE) 1000 UNIT/ML IJ SOLN
INTRAMUSCULAR | Status: AC
  Filled 2020-10-08: qty 1

## 2020-10-08 MED ORDER — SODIUM CHLORIDE 0.9 % IV SOLN: INTRAVENOUS | Status: DC

## 2020-10-08 MED ORDER — SODIUM CHLORIDE 0.9 % IV SOLN
12.5000 mg | Freq: Four times a day (QID) | INTRAVENOUS | Status: DC | PRN
  Administered 2020-10-08: 12.5 mg via INTRAVENOUS
  Filled 2020-10-08: qty 0.5
  Filled 2020-10-08: qty 12.5

## 2020-10-08 MED ORDER — HEPARIN BOLUS VIA INFUSION
4000.0000 [IU] | Freq: Once | INTRAVENOUS | Status: AC
  Administered 2020-10-08: 4000 [IU] via INTRAVENOUS
  Filled 2020-10-08: qty 4000

## 2020-10-08 MED ORDER — FENTANYL CITRATE (PF) 100 MCG/2ML IJ SOLN
INTRAMUSCULAR | Status: DC | PRN
  Administered 2020-10-08: 50 ug via INTRAVENOUS

## 2020-10-08 MED ORDER — FENTANYL CITRATE (PF) 100 MCG/2ML IJ SOLN
INTRAMUSCULAR | Status: AC
  Filled 2020-10-08: qty 2

## 2020-10-08 MED ORDER — ONDANSETRON HCL 4 MG/2ML IJ SOLN
4.0000 mg | Freq: Once | INTRAMUSCULAR | Status: AC
  Administered 2020-10-08: 4 mg via INTRAVENOUS

## 2020-10-08 MED ORDER — IODIXANOL 320 MG/ML IV SOLN
INTRAVENOUS | Status: DC | PRN
  Administered 2020-10-08: 30 mL via INTRAVENOUS

## 2020-10-08 SURGICAL SUPPLY — 14 items
CANISTER PENUMBRA ENGINE (MISCELLANEOUS) ×1 IMPLANT
CATH ANGIO 5F PIGTAIL 100CM (CATHETERS) ×1 IMPLANT
CATH INDIGO SEP 8 (CATHETERS) ×1 IMPLANT
CATH INFINITI JR4 5F (CATHETERS) ×2 IMPLANT
CATH LIGHTNING 8 XTORQ 115 (CATHETERS) ×1 IMPLANT
COVER PROBE U/S 5X48 (MISCELLANEOUS) ×1 IMPLANT
GLIDEWIRE ADV .035X180CM (WIRE) ×1 IMPLANT
PACK ANGIOGRAPHY (CUSTOM PROCEDURE TRAY) ×2 IMPLANT
SHEATH 9FRX11 (SHEATH) ×1 IMPLANT
SHEATH PINNACLE 11FRX10 (SHEATH) ×1 IMPLANT
SYR MEDRAD MARK 7 150ML (SYRINGE) ×1 IMPLANT
TUBING CONTRAST HIGH PRESS 48 (TUBING) ×2 IMPLANT
WIRE GUIDERIGHT .035X150 (WIRE) ×1 IMPLANT
WIRE MAGIC TORQUE 260C (WIRE) ×1 IMPLANT

## 2020-10-08 NOTE — Progress Notes (Signed)
Ultrasound at bedside

## 2020-10-08 NOTE — Progress Notes (Signed)
PROGRESS NOTE    Keith Carter  [id number] DOB: [birth year] DOA: 10/04/2020 PCP: [name], NP   Chief complaint.  Generalized weakness. Brief Narrative:  Keith Carter is a 65 y.o. Caucasian male with medical history significant for hypertension, BPH,  dyslipidemia, and anxiety, TIA and DM2, who initially present to the emergency from 9/5 with weakness and falls.  While in the emergency room, patient was found to have hypoxemia, short of breath.  CT chest with contrast was obtained, showed a saddle PE and bilateral pulmonary emboli.  Patient was placed on heparin drip.  He he was seen by vascular surgery, pulmonary thrombectomy was performed on 9/8.   Assessment & Plan:   Active Problems:   Acute massive pulmonary embolism ([place])  #1.  Acute saddle PE. Right lower extremity DVT. Patient is status post pulmonary thrombectomy. Will continue heparin drip, transition to oral Eliquis before discharge.  #2.  Uncontrolled type 2 diabetes with hyperglycemia  Continue current regimen.  #3.  Failure to thrive. Frequent falls. Generalized weakness. Patient will be evaluated by PT/OT, patient will need a nursing placement.   #4.  Nausea vomiting. Patient has been evaluated by GI, he has stool occult blood positive.  But no significant rectal bleeding or black stool. Patient is on Protonix drip started in the emergency room.  Since he is started on anticoagulation, I will monitor patient for any signs of GI bleed.  #5.  Hypertension emergency. Continue home medicines.   DVT prophylaxis: Heparin Code Status: DNR Family Communication: wife updated Disposition Plan:    Status is: Inpatient  Remains inpatient appropriate because:IV treatments appropriate due to intensity of illness or inability to take PO and Inpatient level of care appropriate due to severity of illness  Dispo: The patient is from: Home              Anticipated d/c is to: SNF              Patient  currently is not medically stable to d/c.   Difficult to place patient No        I/O last 3 completed shifts: In: 100.1 [IV Piggyback:100.1] Out: 100 [Urine:100] No intake/output data recorded.     Consultants:  vascular  Procedures: Pulmonary thrombectomy.  Antimicrobials: None  Subjective: Patient still very sleepy from sedation.  He has a mild nausea, no vomiting. No fever or chills. No short of breath or cough. No abdominal pain or diarrhea. No black stool or rectal bleeding. No dysuria hematuria.  Objective: Vitals:   10/08/20 1130 10/08/20 1200 10/08/20 1230 10/08/20 1315  BP: 133/84 (!) 145/94 (!) 151/91 (!) 147/100  Pulse: 64 69 (!) 59 67  Resp: '18 20 16 18  '$ Temp:    98.3 F (36.8 C)  TempSrc:    Oral  SpO2: 96% 96% 97% 100%  Weight:      Height:        Intake/Output Summary (Last 24 hours) at 10/08/2020 1331 Last data filed at 10/07/2020 2227 Gross per 24 hour  Intake 100.08 ml  Output --  Net 100.08 ml   Filed Weights   10/04/20 2321 10/08/20 0749  Weight: 117.9 kg 117.9 kg    Examination:  General exam: Appears calm and comfortable  Respiratory system: Clear to auscultation. Respiratory effort normal. Cardiovascular system: S1 & S2 heard, RRR. No JVD, murmurs, rubs, gallops or clicks. No pedal edema. Gastrointestinal system: Abdomen is nondistended, soft and nontender. No organomegaly or masses felt. Normal  bowel sounds heard. Central nervous system: Drowsy and oriented x2. No focal neurological deficits. Extremities: Symmetric 5 x 5 power. Skin: No rashes, lesions or ulcers Psychiatry: Judgement and insight appear normal. Mood & affect appropriate.     Data Reviewed: I have personally reviewed following labs and imaging studies  CBC: Recent Labs  Lab 10/05/20 0011 10/06/20 1012 10/07/20 2041  WBC 9.4 10.6* 11.7*  NEUTROABS 7.4 9.5*  --   HGB 14.4 15.0 15.1  HCT 42.5 44.0 44.4  MCV 89.3 88.2 87.6  PLT 206 206 [id number]   Basic  Metabolic Panel: Recent Labs  Lab 10/05/20 0011 10/06/20 1012 10/07/20 2041  NA 139 139 136  K 3.8 3.5 3.7  CL 106 101 97*  CO2 '27 27 29  '$ GLUCOSE 250* 255* 193*  BUN '17 14 18  '$ CREATININE 1.10 1.03 0.90  CALCIUM 9.2 9.0 8.9   GFR: Estimated Creatinine Clearance: 107.1 mL/min (by C-G formula based on SCr of 0.9 mg/dL). Liver Function Tests: Recent Labs  Lab 10/05/20 0011 10/06/20 1012  AST 16 15  ALT 16 14  ALKPHOS 86 81  BILITOT 1.4* 1.6*  PROT 6.5 6.7  ALBUMIN 3.4* 3.4*   No results for input(s): LIPASE, AMYLASE in the last 168 hours. No results for input(s): AMMONIA in the last 168 hours. Coagulation Profile: Recent Labs  Lab 10/07/20 2041  INR 1.1   Cardiac Enzymes: No results for input(s): CKTOTAL, CKMB, CKMBINDEX, TROPONINI in the last 168 hours. BNP (last 3 results) No results for input(s): PROBNP in the last 8760 hours. HbA1C: Recent Labs    10/07/20 2041  HGBA1C 9.0*   CBG: Recent Labs  Lab 10/07/20 2234 10/08/20 0957  GLUCAP 191* 221*   Lipid Profile: No results for input(s): CHOL, HDL, LDLCALC, TRIG, CHOLHDL, LDLDIRECT in the last 72 hours. Thyroid Function Tests: No results for input(s): TSH, T4TOTAL, FREET4, T3FREE, THYROIDAB in the last 72 hours. Anemia Panel: No results for input(s): VITAMINB12, FOLATE, FERRITIN, TIBC, IRON, RETICCTPCT in the last 72 hours. Sepsis Labs: Recent Labs  Lab 10/05/20 0011  LATICACIDVEN 1.6    Recent Results (from the past 240 hour(s))  Resp Panel by RT-PCR (Flu A&B, Covid) Nasopharyngeal Swab     Status: None   Collection Time: 10/01/20  4:08 AM   Specimen: Nasopharyngeal Swab; Nasopharyngeal(NP) swabs in vial transport medium  Result Value Ref Range Status   SARS Coronavirus 2 by RT PCR NEGATIVE NEGATIVE Final    Comment: (NOTE) SARS-CoV-2 target nucleic acids are NOT DETECTED.  The SARS-CoV-2 RNA is generally detectable in upper respiratory specimens during the acute phase of infection. The  lowest concentration of SARS-CoV-2 viral copies this assay can detect is 138 copies/mL. A negative result does not preclude SARS-Cov-2 infection and should not be used as the sole basis for treatment or other patient management decisions. A negative result may occur with  improper specimen collection/handling, submission of specimen other than nasopharyngeal swab, presence of viral mutation(s) within the areas targeted by this assay, and inadequate number of viral copies(<138 copies/mL). A negative result must be combined with clinical observations, patient history, and epidemiological information. The expected result is Negative.  Fact Sheet for Patients:  EntrepreneurPulse.com.au  Fact Sheet for Healthcare Providers:  IncredibleEmployment.be  This test is no t yet approved or cleared by the [place] FDA and  has been authorized for detection and/or diagnosis of SARS-CoV-2 by FDA under an Emergency Use Authorization (EUA). This EUA will remain  in effect (meaning this  test can be used) for the duration of the COVID-19 declaration under Section 564(b)(1) of the Act, 21 U.S.C.section 360bbb-3(b)(1), unless the authorization is terminated  or revoked sooner.       Influenza A by PCR NEGATIVE NEGATIVE Final   Influenza B by PCR NEGATIVE NEGATIVE Final    Comment: (NOTE) The Xpert Xpress SARS-CoV-2/FLU/RSV plus assay is intended as an aid in the diagnosis of influenza from Nasopharyngeal swab specimens and should not be used as a sole basis for treatment. Nasal washings and aspirates are unacceptable for Xpert Xpress SARS-CoV-2/FLU/RSV testing.  Fact Sheet for Patients: EntrepreneurPulse.com.au  Fact Sheet for Healthcare Providers: IncredibleEmployment.be  This test is not yet approved or cleared by the [place] FDA and has been authorized for detection and/or diagnosis of SARS-CoV-2 by FDA under  an Emergency Use Authorization (EUA). This EUA will remain in effect (meaning this test can be used) for the duration of the COVID-19 declaration under Section 564(b)(1) of the Act, 21 U.S.C. section 360bbb-3(b)(1), unless the authorization is terminated or revoked.  Performed at [facility], [address] [place], [place] [address]   Resp Panel by RT-PCR (Flu A&B, Covid) Nasopharyngeal Swab     Status: None   Collection Time: 10/04/20 11:31 PM   Specimen: Nasopharyngeal Swab; Nasopharyngeal(NP) swabs in vial transport medium  Result Value Ref Range Status   SARS Coronavirus 2 by RT PCR NEGATIVE NEGATIVE Final    Comment: (NOTE) SARS-CoV-2 target nucleic acids are NOT DETECTED.  The SARS-CoV-2 RNA is generally detectable in upper respiratory specimens during the acute phase of infection. The lowest concentration of SARS-CoV-2 viral copies this assay can detect is 138 copies/mL. A negative result does not preclude SARS-Cov-2 infection and should not be used as the sole basis for treatment or other patient management decisions. A negative result may occur with  improper specimen collection/handling, submission of specimen other than nasopharyngeal swab, presence of viral mutation(s) within the areas targeted by this assay, and inadequate number of viral copies(<138 copies/mL). A negative result must be combined with clinical observations, patient history, and epidemiological information. The expected result is Negative.  Fact Sheet for Patients:  EntrepreneurPulse.com.au  Fact Sheet for Healthcare Providers:  IncredibleEmployment.be  This test is no t yet approved or cleared by the [place] FDA and  has been authorized for detection and/or diagnosis of SARS-CoV-2 by FDA under an Emergency Use Authorization (EUA). This EUA will remain  in effect (meaning this test can be used) for the duration of the COVID-19 declaration under  Section 564(b)(1) of the Act, 21 U.S.C.section 360bbb-3(b)(1), unless the authorization is terminated  or revoked sooner.       Influenza A by PCR NEGATIVE NEGATIVE Final   Influenza B by PCR NEGATIVE NEGATIVE Final    Comment: (NOTE) The Xpert Xpress SARS-CoV-2/FLU/RSV plus assay is intended as an aid in the diagnosis of influenza from Nasopharyngeal swab specimens and should not be used as a sole basis for treatment. Nasal washings and aspirates are unacceptable for Xpert Xpress SARS-CoV-2/FLU/RSV testing.  Fact Sheet for Patients: EntrepreneurPulse.com.au  Fact Sheet for Healthcare Providers: IncredibleEmployment.be  This test is not yet approved or cleared by the [place] FDA and has been authorized for detection and/or diagnosis of SARS-CoV-2 by FDA under an Emergency Use Authorization (EUA). This EUA will remain in effect (meaning this test can be used) for the duration of the COVID-19 declaration under Section 564(b)(1) of the Act, 21 U.S.C. section 360bbb-3(b)(1), unless the authorization  is terminated or revoked.  Performed at [facility], [place], [place] [address]   SARS CORONAVIRUS 2 (TAT 6-24 HRS) Nasopharyngeal Nasopharyngeal Swab     Status: None   Collection Time: 10/07/20 10:06 PM   Specimen: Nasopharyngeal Swab  Result Value Ref Range Status   SARS Coronavirus 2 NEGATIVE NEGATIVE Final    Comment: (NOTE) SARS-CoV-2 target nucleic acids are NOT DETECTED.  The SARS-CoV-2 RNA is generally detectable in upper and lower respiratory specimens during the acute phase of infection. Negative results do not preclude SARS-CoV-2 infection, do not rule out co-infections with other pathogens, and should not be used as the sole basis for treatment or other patient management decisions. Negative results must be combined with clinical observations, patient history, and epidemiological information. The  expected result is Negative.  Fact Sheet for Patients: SugarRoll.be  Fact Sheet for Healthcare Providers: https://www.woods-mathews.com/  This test is not yet approved or cleared by the [place] FDA and  has been authorized for detection and/or diagnosis of SARS-CoV-2 by FDA under an Emergency Use Authorization (EUA). This EUA will remain  in effect (meaning this test can be used) for the duration of the COVID-19 declaration under Se ction 564(b)(1) of the Act, 21 U.S.C. section 360bbb-3(b)(1), unless the authorization is terminated or revoked sooner.  Performed at [facility], [place] [address] [place], [place] [address]          Radiology Studies: CT CHEST ABDOMEN PELVIS W CONTRAST  Result Date: 10/07/2020 CLINICAL DATA:  65 year old male with history of nausea and vomiting. EXAM: CT CHEST, ABDOMEN, AND PELVIS WITH CONTRAST TECHNIQUE: Multidetector CT imaging of the chest, abdomen and pelvis was performed following the standard protocol during bolus administration of intravenous contrast. CONTRAST:  121m OMNIPAQUE IOHEXOL 350 MG/ML SOLN COMPARISON:  No priors. FINDINGS: CT CHEST FINDINGS Cardiovascular: There is a large filling defect extending from the distal pulmonic trunk into both of the main pulmonary arteries bilaterally, as well as several lobar, segmental and subsegmental sized branches, indicative of pulmonary embolism. Heart size is enlarged with right atrial and right ventricular dilatation. Right ventricular diameter estimated at 57 mm. Left ventricular diameter estimated at 46 mm. RV to LV ratio of 1.24. There is no significant pericardial fluid, thickening or pericardial calcification. There is aortic atherosclerosis, as well as atherosclerosis of the great vessels of the mediastinum and the coronary arteries, including calcified atherosclerotic plaque in the left anterior descending, left circumflex and right coronary  arteries. Calcifications of the aortic valve. Mediastinum/Nodes: No pathologically enlarged mediastinal or hilar lymph nodes. Esophagus is unremarkable in appearance. No axillary lymphadenopathy. Lungs/Pleura: No acute consolidative airspace disease. Trace right pleural effusion lying dependently. No pneumothorax. No definite suspicious appearing pulmonary nodules or masses are noted. Musculoskeletal: Healing nondisplaced fractures of the anterolateral aspect of the left fifth and sixth ribs. There are no aggressive appearing lytic or blastic lesions noted in the visualized portions of the skeleton. CT ABDOMEN PELVIS FINDINGS Hepatobiliary: No suspicious cystic or solid hepatic lesions. No intra or extrahepatic biliary ductal dilatation. Gallbladder is normal in appearance. Pancreas: No pancreatic mass. No pancreatic ductal dilatation. No pancreatic or peripancreatic fluid collections or inflammatory changes. Spleen: Unremarkable. Adrenals/Urinary Tract: 7 mm nonobstructive calculus in the lower pole collecting system of the left kidney. Multiple low-attenuation lesions in the left kidney, compatible with a combination of simple cysts and parapelvic cysts, largest of which is exophytic in the upper pole of the left kidney measuring up to 2.7  cm in diameter. No hydroureteronephrosis. Urinary bladder is nearly completely decompressed, but otherwise unremarkable in appearance. Bilateral adrenal glands are normal in appearance. Stomach/Bowel: The appearance of the stomach is normal. No pathologic dilatation of small bowel or colon. Numerous colonic diverticulae are noted, particularly in the distal descending colon and proximal sigmoid colon, without surrounding inflammatory changes to suggest an acute diverticulitis at this time. The appendix is not confidently identified and may be surgically absent. Regardless, there are no inflammatory changes noted adjacent to the cecum to suggest the presence of an acute  appendicitis at this time. Vascular/Lymphatic: Aortic atherosclerosis, without evidence of aneurysm or dissection in the abdominal or pelvic vasculature. No lymphadenopathy noted in the abdomen or pelvis. Reproductive: Prostate gland and seminal vesicles are unremarkable in appearance. Other: There is an unusual complex fluid density structure which has what appears to be an operative mass associated within the anterior aspect of the abdomen intimately associated with the anterior abdominal wall (axial image 79 of series 2 and sagittal image 94 of series 6) estimated to measure approximately 16.4 x 9.7 x 14.1 cm, favored to represent a chronic postoperative seroma associated with a now misplaced operative mash, likely for attempted repair of ventral hernia. Immediately to the left of this there is a small ventral hernia containing short segments of mid small bowel. No significant volume of ascites. No pneumoperitoneum. Musculoskeletal: Status post PLIF from L2-S1 with interbody cages at L2-L3, L3-L4, L4-L5 and L5-S1. There are no aggressive appearing lytic or blastic lesions noted in the visualized portions of the skeleton. IMPRESSION: 1. Study is positive for a large saddle embolus extending into lobar, segmental and subsegmental sized branches in the lungs bilaterally. There is also CT evidence of right heart strain (RV/LV Ratio = 1.24) consistent with at least submassive (intermediate risk) PE. The presence of right heart strain has been associated with an increased risk of morbidity and mortality. The presence of right heart strain has been associated with an increased risk of morbidity and mortality. Please refer to the "PE Focused" order set in EPIC. 2. Trace right pleural effusion lying dependently. 3. No other acute findings are noted elsewhere in the chest, abdomen or pelvis. 4. Unusual fluid collection in the anterior aspect of the abdomen which appears to be associated with and operative mesh, favored to  represent a large postoperative seroma associated with mesh for prior attempted repair of ventral hernia. Immediately adjacent to this there is a recurrent ventral hernia which contains a short segment of small bowel. No evidence of associated bowel incarceration or obstruction at this time. 5. 7 mm nonobstructive calculus in the lower pole collecting system of left kidney. 6. Colonic diverticulosis without evidence of acute diverticulitis at this time. 7. Additional incidental findings, as above. Critical Value/emergent results were called by telephone at the time of interpretation on 10/07/2020 at 6:42 pm to provider [facility], who verbally acknowledged these results. Electronically Signed   By: Vinnie Langton M.D.   On: 10/07/2020 18:44   PERIPHERAL VASCULAR CATHETERIZATION  Result Date: 10/08/2020 See surgical note for result.  US Venous Img Lower Bilateral (DVT)  Result Date: 10/08/2020 CLINICAL DATA:  Positive CT for pulmonary emboli EXAM: BILATERAL LOWER EXTREMITY VENOUS DOPPLER ULTRASOUND TECHNIQUE: Gray-scale sonography with graded compression, as well as color Doppler and duplex ultrasound were performed to evaluate the lower extremity deep venous systems from the level of the common femoral vein and including the common femoral, femoral, profunda femoral, popliteal and calf veins including the  posterior tibial, peroneal and gastrocnemius veins when visible. The superficial great saphenous vein was also interrogated. Spectral Doppler was utilized to evaluate flow at rest and with distal augmentation maneuvers in the common femoral, femoral and popliteal veins. COMPARISON:  None. FINDINGS: RIGHT LOWER EXTREMITY Common Femoral Vein: No evidence of thrombus. Normal compressibility, respiratory phasicity and response to augmentation. Saphenofemoral Junction: No evidence of thrombus. Normal compressibility and flow on color Doppler imaging. Profunda Femoral Vein: No evidence of thrombus. Normal  compressibility and flow on color Doppler imaging. Femoral Vein: Proximal and mid portions of the femoral vein appear patent and compressible. Distal femoral vein is noncompressible with hypoechoic thrombus appearing nonocclusive. Phasic flow maintained. Popliteal Vein: Mixed echogenicity intraluminal thrombus appearing nonocclusive. Vessel is noncompressible. There is some phasic flow present. Calf Veins: Thrombus does appear to extend into the tibial and peroneal calf veins. LEFT LOWER EXTREMITY Common Femoral Vein: No evidence of thrombus. Normal compressibility, respiratory phasicity and response to augmentation. Saphenofemoral Junction: No evidence of thrombus. Normal compressibility and flow on color Doppler imaging. Profunda Femoral Vein: No evidence of thrombus. Normal compressibility and flow on color Doppler imaging. Femoral Vein: No evidence of thrombus. Normal compressibility, respiratory phasicity and response to augmentation. Popliteal Vein: No evidence of thrombus. Normal compressibility, respiratory phasicity and response to augmentation. Calf Veins: No evidence of thrombus. Normal compressibility and flow on color Doppler imaging. IMPRESSION: Positive exam for right distal femoropopliteal DVT extending into the calf tibial and peroneal veins. No evidence of left lower extremity DVT. Electronically Signed   By: Jerilynn Mages.  Shick M.D.   On: 10/08/2020 10:40        Scheduled Meds:  amLODipine  5 mg Oral Daily   atorvastatin  40 mg Oral q1800   cholecalciferol  5,000 Units Oral Daily   clopidogrel  75 mg Oral Daily   fentaNYL       heparin sodium (porcine)       insulin aspart  0-9 Units Subcutaneous TID AC & HS   lisinopril  40 mg Oral QHS   metFORMIN  1,000 mg Oral BID WC   midazolam       PARoxetine  40 mg Oral QHS   tamsulosin  0.8 mg Oral QHS   Continuous Infusions:  0.9 % NaCl with KCl 20 mEq / L 100 mL/hr at 10/08/20 [id number]   heparin 1,600 Units/hr (10/08/20 0615)   pantoprazole 8  mg/hr (10/08/20 0634)     LOS: 1 day    Time spent: 32 minutes    Sharen Hones, MD [facility]   To contact the attending provider between 7A-7P or the covering provider during after hours 7P-7A, please log into the web site [email or web address] and access using universal [place] password for that web site. If you do not have the password, please call the hospital operator.  10/08/2020, 1:31 PM

## 2020-10-08 NOTE — Progress Notes (Signed)
[place] for IV heparin Indication: pulmonary embolus  No Known Allergies  Patient Measurements: Height: '6\' 1"'$  (185.4 cm) Weight: 117 kg (257 lb 15 oz) IBW/kg (Calculated) : 79.9 Heparin Dosing Weight: 105.3 kg  Vital Signs: Temp: 98.3 F (36.8 C) (09/08 1315) Temp Source: Oral (09/08 1315) BP: 147/100 (09/08 1315) Pulse Rate: 67 (09/08 1315)  Labs: Recent Labs    10/06/20 1012 10/07/20 2041 10/08/20 0243 10/08/20 1344  HGB 15.0 15.1  --  13.6  HCT 44.0 44.4  --  39.9  PLT 206 200  --   --   APTT  --  35  --   --   LABPROT  --  14.6  --   --   INR  --  1.1  --   --   HEPARINUNFRC  --   --  0.94* <0.10*  CREATININE 1.03 0.90  --  1.01  TROPONINIHS 12 89*  --   --      Estimated Creatinine Clearance: 95.1 mL/min (by C-G formula based on SCr of 1.01 mg/dL).   Medical History: Past Medical History:  Diagnosis Date   Anxiety    Dyslipidemia    Enlarged prostate    Hypertension    Prediabetes    TIA (transient ischemic attack)     Medications:  Medications Prior to Admission  Medication Sig Dispense Refill Last Dose   amLODipine (NORVASC) 5 MG tablet Take 1 tablet (5 mg total) by mouth daily. 30 tablet 0 unknown at unknown   clopidogrel (PLAVIX) 75 MG tablet Take 75 mg by mouth daily.   unknown at unknown   lisinopril (ZESTRIL) 40 MG tablet Take 40 mg by mouth at bedtime.    unknown at unknown   metFORMIN (GLUCOPHAGE-XR) 500 MG 24 hr tablet Take 1,000 mg by mouth 2 (two) times daily.   unknown at unknown   tamsulosin (FLOMAX) 0.4 MG CAPS capsule Take 0.8 mg by mouth at bedtime.       acetaminophen (TYLENOL) 500 MG tablet Take 500 mg by mouth every 6 (six) hours as needed.    prn at prn   atorvastatin (LIPITOR) 40 MG tablet Take 1 tablet (40 mg total) by mouth daily at 6 PM. 30 tablet 0    Cholecalciferol 25 MCG (1000 UT) tablet Take 5,000 Units by mouth daily.       PARoxetine (PAXIL) 40 MG tablet Take 40 mg by mouth at  bedtime.    unknown at unknown   Potassium 99 MG TABS Take by mouth daily.      sildenafil (REVATIO) 20 MG tablet Take 20 mg by mouth daily as needed.      Scheduled:   amLODipine  5 mg Oral Daily   atorvastatin  40 mg Oral q1800   cholecalciferol  5,000 Units Oral Daily   clopidogrel  75 mg Oral Daily   fentaNYL       heparin sodium (porcine)       insulin aspart  0-9 Units Subcutaneous TID AC & HS   lisinopril  40 mg Oral QHS   metFORMIN  1,000 mg Oral BID WC   midazolam       pantoprazole (PROTONIX) IV  40 mg Intravenous Q12H   PARoxetine  40 mg Oral QHS   tamsulosin  0.8 mg Oral QHS   Infusions:   0.9 % NaCl with KCl 20 mEq / L 100 mL/hr at 10/08/20 0614   heparin 1,600 Units/hr (10/08/20 1512)  PRN: acetaminophen **OR** acetaminophen, labetalol, magnesium hydroxide, ondansetron **OR** ondansetron (ZOFRAN) IV, traZODone Anti-infectives (From admission, onward)    Start     Dose/Rate Route Frequency Ordered Stop   10/08/20 0830  ceFAZolin (ANCEF) IVPB 2g/100 mL premix        2 g 200 mL/hr over 30 Minutes Intravenous  Once 10/08/20 0826 10/08/20 1313       Assessment: 65YOM with pertinent cardiac PMH including HTN, TIA and CVA presenting with weakness on 8/31. Today during ED course, patient had O2 sats in the upper 80%, as well as vomiting. CT scan ordered which was positive for "large saddle embolus extending into lobar, segmental and subsegmental sized branches in the lungs Bilaterally" and submassive PE. Per notes, plan is to start heparin now and then undergo thrombectomy tomorrow on 9/8.   Goal of Therapy:  Heparin level 0.3-0.7 units/ml Monitor platelets by anticoagulation protocol: Yes  9/8 0243 HL 0.94, supratherapeutic 9/8 1344 HL < 0.1 Will bolus 4000 units x 1 and continue heparin infusion at 1600 units/hr as there might have been interruption in therapy.    Plan:  Heparin level is subtherapuetic Will give heparin bolus 4000 units x 1 and continue heparin  at 1600 units/hr. Recheck heparin level in 6 hours. CBC daily while on heparin.   Eleonore Chiquito PharmD 10/08/2020 3:52 PM

## 2020-10-08 NOTE — Op Note (Signed)
[facility]  Percutaneous Study/Intervention Procedural Note   Date of Surgery: 10/08/2020,9:11 AM  Surgeon: Leotis Pain  Pre-operative Diagnosis: Symptomatic bilateral pulmonary emboli  Post-operative diagnosis:  Same  Procedure(s) Performed:  1.  Contrast injection right heart  2.  Mechanical thrombectomy of the left lower lobe pulmonary artery in the right lower lobe, middle lobe, and upper lobe pulmonary arteries with the penumbra CAT 8 device  4.  Selective catheter placement right lower lobe, middle lobe, and upper lobe pulmonary artery  5.  Selective catheter placement left lower lobe and upper lobe pulmonary arteries    Anesthesia: Conscious sedation was administered under my direct supervision by the interventional radiology RN. IV Versed plus fentanyl were utilized. Continuous ECG, pulse oximetry and blood pressure was monitored throughout the entire procedure.  Versed and fentanyl were administered intravenously.  Conscious sedation was administered for a total of 36 minutes using 2 of Versed and 50 mcg of Fentanyl.  EBL: 300 cc  Sheath: 9 French right femoral vein  Contrast: 70 cc   Fluoroscopy Time: 10.9 minutes  Indications:  Patient presents with pulmonary emboli. The patient is symptomatic with hypoxemia and dyspnea on exertion.  There is evidence of right heart strain on the CT angiogram. The patient is otherwise a good candidate for intervention and even the long-term benefits pulmonary angiography with thrombolysis is offered. The risks and benefits are reviewed long-term benefits are discussed. All questions are answered patient agrees to proceed.  Procedure:  Keith Carter a 65 y.o. male who was identified and appropriate procedural time out was performed.  The patient was then placed supine on the table and prepped and draped in the usual sterile fashion.  Ultrasound was used to evaluate the right common femoral vein.  It was patent, as it  was echolucent and compressible.  A digital ultrasound image was acquired for the permanent record.  A Seldinger needle was used to access the right common femoral vein under direct ultrasound guidance.  A 0.035 J wire was advanced without resistance and a 5Fr sheath was placed and then upsized to an 9 [place] sheath.    The wire and pigtail catheter were then negotiated into the right atrium and bolus injection of contrast was utilized to demonstrate the right ventricle and the pulmonary artery outflow. The wire and catheter were then negotiated into the main pulmonary artery with a JR4 catheter and then first into the left lower lobe pulmonary artery where selective imaging showed significant thrombus burden.  The left upper lobe pulmonary artery only had a small amount of thrombus burden after was selectively cannulated with a JR4 catheter.  I then used the advantage wire and the JR4 catheter to turn my attention of the right side.  The right lower lobe was cannulated for showing a least a moderate amount of thrombus burden.  The right middle lobe also showed a moderate amount of thrombus burden after selective cannulation with a JR4 catheter and selective imaging.  The right upper lobe was cannulated last and selective imaging showed a large thrombus burden with near occlusive thrombus in the right upper lobe.   The Penumbra Cat 8 catheter was then advanced up into the pulmonary vasculature. The left lung was addressed first. Catheter was negotiated into the left lower lobe pulmonary artery and mechanical thrombectomy was performed using the catheter and the separator. Follow-up imaging demonstrated a good result.   The Penumbra Cat 8 catheter was then negotiated to the opposite  side. The right lung was then addressed. Catheter was negotiated into the right lower lobe and mechanical thrombectomy was performed. Follow-up imaging demonstrated a good result and therefore the catheter was renegotiated into the  right upper lobe pulmonary artery and again mechanical thrombectomy was performed. Passes were made with both the Penumbra catheter itself as well as introducing the separator. Follow-up imaging was then performed.  Marked improvement with near resolution of the large thrombus burden in the right upper lobe was seen.  I then made several passes in the right middle lobe and completion imaging showed improvement with only small amount of residual thrombus in the right middle lobe.  After review these images wires were reintroduced and the catheters removed. Then, the sheath is then pulled and pressures held. A safeguard is placed.    Findings:   Right heart imaging:  Right atrium and right ventricle and the pulmonary outflow tract appears quite dilated  Right lung: Large amount of thrombus burden in the right upper lobe with a moderate amount of thrombus burden in the right middle and lower lobes  Left lung: Moderate amount of thrombus burden in the left lower lobe and a small amount of thrombus burden in the left upper lobe.    Disposition: Patient was taken to the recovery room in stable condition having tolerated the procedure well.  Leotis Pain 10/08/2020,9:11 AM

## 2020-10-08 NOTE — Progress Notes (Signed)
[place] for IV heparin Indication: pulmonary embolus  No Known Allergies  Patient Measurements: Height: '6\' 1"'$  (185.4 cm) Weight: 117 kg (257 lb 15 oz) IBW/kg (Calculated) : 79.9 Heparin Dosing Weight: 105.3 kg  Vital Signs: Temp: 98 F (36.7 C) (09/08 2014) Temp Source: Oral (09/08 2014) BP: 158/102 (09/08 2015) Pulse Rate: 71 (09/08 2015)  Labs: Recent Labs    10/06/20 1012 10/07/20 2041 10/08/20 0243 10/08/20 1344 10/08/20 2029 10/08/20 2203  HGB 15.0 15.1  --  13.6 14.2  --   HCT 44.0 44.4  --  39.9 41.5  --   PLT 206 200  --   --   --   --   APTT  --  35  --   --   --   --   LABPROT  --  14.6  --   --   --   --   INR  --  1.1  --   --   --   --   HEPARINUNFRC  --   --  0.94* <0.10*  --  0.49  CREATININE 1.03 0.90  --  1.01  --   --   TROPONINIHS 12 89*  --   --   --   --      Estimated Creatinine Clearance: 95.1 mL/min (by C-G formula based on SCr of 1.01 mg/dL).   Medical History: Past Medical History:  Diagnosis Date   Anxiety    Dyslipidemia    Enlarged prostate    Hypertension    Prediabetes    TIA (transient ischemic attack)     Medications:  Medications Prior to Admission  Medication Sig Dispense Refill Last Dose   amLODipine (NORVASC) 5 MG tablet Take 1 tablet (5 mg total) by mouth daily. 30 tablet 0 unknown at unknown   clopidogrel (PLAVIX) 75 MG tablet Take 75 mg by mouth daily.   unknown at unknown   lisinopril (ZESTRIL) 40 MG tablet Take 40 mg by mouth at bedtime.    unknown at unknown   metFORMIN (GLUCOPHAGE-XR) 500 MG 24 hr tablet Take 1,000 mg by mouth 2 (two) times daily.   unknown at unknown   tamsulosin (FLOMAX) 0.4 MG CAPS capsule Take 0.8 mg by mouth at bedtime.       acetaminophen (TYLENOL) 500 MG tablet Take 500 mg by mouth every 6 (six) hours as needed.    prn at prn   atorvastatin (LIPITOR) 40 MG tablet Take 1 tablet (40 mg total) by mouth daily at 6 PM. 30 tablet 0    Cholecalciferol 25  MCG (1000 UT) tablet Take 5,000 Units by mouth daily.       PARoxetine (PAXIL) 40 MG tablet Take 40 mg by mouth at bedtime.    unknown at unknown   Potassium 99 MG TABS Take by mouth daily.      sildenafil (REVATIO) 20 MG tablet Take 20 mg by mouth daily as needed.      Scheduled:   amLODipine  5 mg Oral Daily   atorvastatin  40 mg Oral q1800   cholecalciferol  5,000 Units Oral Daily   clopidogrel  75 mg Oral Daily   insulin aspart  0-9 Units Subcutaneous TID AC & HS   lisinopril  40 mg Oral QHS   metFORMIN  1,000 mg Oral BID WC   pantoprazole (PROTONIX) IV  40 mg Intravenous Q12H   PARoxetine  40 mg Oral QHS   tamsulosin  0.8 mg  Oral QHS   Infusions:   0.9 % NaCl with KCl 20 mEq / L 75 mL/hr at 10/08/20 1621   heparin 1,600 Units/hr (10/08/20 1512)   promethazine (PHENERGAN) injection (IM or IVPB) 12.5 mg (10/08/20 2234)   PRN: acetaminophen **OR** acetaminophen, labetalol, magnesium hydroxide, ondansetron **OR** ondansetron (ZOFRAN) IV, promethazine (PHENERGAN) injection (IM or IVPB), traZODone Anti-infectives (From admission, onward)    Start     Dose/Rate Route Frequency Ordered Stop   10/08/20 0830  ceFAZolin (ANCEF) IVPB 2g/100 mL premix        2 g 200 mL/hr over 30 Minutes Intravenous  Once 10/08/20 0826 10/08/20 1313       Assessment: 65YOM with pertinent cardiac PMH including HTN, TIA and CVA presenting with weakness on 8/31. Today during ED course, patient had O2 sats in the upper 80%, as well as vomiting. CT scan ordered which was positive for "large saddle embolus extending into lobar, segmental and subsegmental sized branches in the lungs Bilaterally" and submassive PE. Per notes, plan is to start heparin now and then undergo thrombectomy tomorrow on 9/8.   Goal of Therapy:  Heparin level 0.3-0.7 units/ml Monitor platelets by anticoagulation protocol: Yes  9/8 0243 HL 0.94, supratherapeutic 9/8 1344 HL < 0.1  9/8 2203 HL 0.49, therapeutic x 1   Plan:   Continue heparin at 1600 units/hr.  Recheck HL w/ AM labs (in 6 hr) to confirm CBC daily while on heparin.   Renda Rolls, PharmD, [facility] 10/08/2020 11:47 PM

## 2020-10-08 NOTE — Progress Notes (Signed)
[place] for IV heparin Indication: pulmonary embolus  No Known Allergies  Patient Measurements: Height: '6\' 1"'$  (185.4 cm) Weight: 117.9 kg (259 lb 14.8 oz) IBW/kg (Calculated) : 79.9 Heparin Dosing Weight: 105.3 kg  Vital Signs: Temp: 99.8 F (37.7 C) (09/07 1706) BP: 145/97 (09/08 0230) Pulse Rate: 74 (09/08 0230)  Labs: Recent Labs    10/06/20 1012 10/07/20 2041 10/08/20 0243  HGB 15.0 15.1  --   HCT 44.0 44.4  --   PLT 206 200  --   APTT  --  35  --   LABPROT  --  14.6  --   INR  --  1.1  --   HEPARINUNFRC  --   --  0.94*  CREATININE 1.03 0.90  --   TROPONINIHS 12 89*  --      Estimated Creatinine Clearance: 107.1 mL/min (by C-G formula based on SCr of 0.9 mg/dL).   Medical History: Past Medical History:  Diagnosis Date   Anxiety    Dyslipidemia    Enlarged prostate    Hypertension    Prediabetes    TIA (transient ischemic attack)     Medications:  (Not in a hospital admission) Scheduled:   amLODipine  5 mg Oral Daily   atorvastatin  40 mg Oral q1800   cholecalciferol  5,000 Units Oral Daily   clopidogrel  75 mg Oral Daily   insulin aspart  0-9 Units Subcutaneous TID AC & HS   lisinopril  40 mg Oral QHS   metFORMIN  1,000 mg Oral BID WC   PARoxetine  40 mg Oral QHS   tamsulosin  0.8 mg Oral QHS   Infusions:   0.9 % NaCl with KCl 20 mEq / L 100 mL/hr at 10/08/20 0202   heparin 1,800 Units/hr (10/08/20 0202)   pantoprazole 8 mg/hr (10/08/20 0203)   PRN: acetaminophen **OR** acetaminophen, labetalol, magnesium hydroxide, ondansetron **OR** ondansetron (ZOFRAN) IV, traZODone Anti-infectives (From admission, onward)    None       Assessment: 65YOM with pertinent cardiac PMH including HTN, TIA and CVA presenting with weakness on 8/31. Today during ED course, patient had O2 sats in the upper 80%, as well as vomiting. CT scan ordered which was positive for "large saddle embolus extending into lobar,  segmental and subsegmental sized branches in the lungs Bilaterally" and submassive PE. Per notes, plan is to start heparin now and then undergo thrombectomy tomorrow on 9/8.   Goal of Therapy:  Heparin level 0.3-0.7 units/ml Monitor platelets by anticoagulation protocol: Yes  9/8 0243 HL 0.94, supratherapeutic   Plan:  Decrease heparin infusion to 1600 units/hr Check HL lin 6 hrs after rate change Baseline aPTT, INR Daily CBC  Renda Rolls, PharmD, [facility] 10/08/2020 3:26 AM

## 2020-10-08 NOTE — Progress Notes (Signed)
PT Cancellation Note  Patient Details Name: Rayon Rudkin MRN: [id number] DOB: 02/17/1955   Cancelled Treatment:    Reason Eval/Treat Not Completed: Other (comment). Chart reviewed. Sats began dropping yesterday evening with acute PE noted with pending thrombectomy. Will hold exertional mobility with PT this date and re-attempt when medically stable.   Aldine Chakraborty 10/08/2020, 8:24 AM Greggory Stallion, PT, DPT [phone]

## 2020-10-08 NOTE — Consult Note (Signed)
[facility] Consult Note  MRN : [id number]  Keith Carter is a 65 y.o. (November 01, 1955) male who presents with chief complaint of  Chief Complaint  Patient presents with   Weakness    Pt from home via EMS. EMS reports weakness and being called to home three rime this day. Pt states he feels find  .  History of Present Illness: I am asked to see the patient by Dr. Cinda Quest for saddle pulmonary embolus with right heart strain seen on CT scan.  Patient has been in the emergency room and has been generally well for the past couple days.  He had lethargy and fatigue and overall has been feeling poorly now for a couple of weeks.  He underwent a CT scan which I have independently reviewed.  This is not a CT angiogram, but it does demonstrate relatively large saddle pulmonary embolus involving both lungs as well as CT evidence of right heart strain.  Given these findings we are contacted for evaluation for pulmonary thrombectomy.  Current Facility-Administered Medications  Medication Dose Route Frequency Provider Last Rate Last Admin   0.9 % NaCl with KCl 20 mEq/ L  infusion   Intravenous Continuous Mansy, Jan A, MD 100 mL/hr at 10/08/20 0614 Rate Verify at 10/08/20 0614   [MAR Hold] acetaminophen (TYLENOL) tablet 650 mg  650 mg Oral Q6H PRN Mansy, Jan A, MD       Or   [facility] Hold] acetaminophen (TYLENOL) suppository 650 mg  650 mg Rectal Q6H PRN Mansy, Arvella Merles, MD       [MAR Hold] amLODipine (NORVASC) tablet 5 mg  5 mg Oral Daily Vanessa Hurricane, MD   5 mg at 10/07/20 0914   [MAR Hold] atorvastatin (LIPITOR) tablet 40 mg  40 mg Oral q1800 Vanessa [place], MD   40 mg at 10/07/20 1708   [MAR Hold] cholecalciferol (VITAMIN D3) tablet 5,000 Units  5,000 Units Oral Daily Vanessa Farley, MD   5,000 Units at 10/07/20 0914   [MAR Hold] clopidogrel (PLAVIX) tablet 75 mg  75 mg Oral Daily Vanessa [place], MD   75 mg at 10/07/20 0915   heparin ADULT infusion 100 units/mL (25000 units/283m)   1,600 Units/hr Intravenous Continuous BRenda Rolls RPH 16 mL/hr at 10/08/20 0615 1,600 Units/hr at 10/08/20 0615   [MAR Hold] insulin aspart (novoLOG) injection 0-9 Units  0-9 Units Subcutaneous TID [facility] Mansy, Jan A, MD   2 Units at 10/07/20 2239   [facility] lab[facility] (NORMODYNE) injection 20 mg  20 mg Intravenous Q3H PRN Mansy, Jan A, MD       [MAR Hold] lisinopril (ZESTRIL) tablet 40 mg  40 mg Oral QHS FVanessa Rainbow MD   40 mg at 10/07/20 2239   [facility] magnesium hydroxide (MILK OF MAGNESIA) suspension 30 mL  30 mL Oral Daily PRN Mansy, Jan A, MD       [MAR Hold] metFORMIN (GLUCOPHAGE-XR) 24 hr tablet 1,000 mg  1,000 mg Oral BID WC FVanessa [place] MD   1,000 mg at 10/07/20 1708   [MAR Hold] ondansetron (ZOFRAN) tablet 4 mg  4 mg Oral Q6H PRN Mansy, Jan A, MD       Or   [facility] ondansetron ([facility] injection 4 mg  4 mg Intravenous Q6H PRN Mansy, Jan A, MD   4 mg at 10/07/20 2150   pantoprozole (PROTONIX) 80 mg /NS 100 mL infusion  8 mg/hr Intravenous Continuous  Nena Polio, MD 10 mL/hr at 10/08/20 [id number] 8 mg/hr at 10/08/20 0634   [MAR Hold] PARoxetine (PAXIL) tablet 40 mg  40 mg Oral QHS Vanessa [place], MD   40 mg at 10/07/20 2240   [MAR Hold] tamsulosin (FLOMAX) capsule 0.8 mg  0.8 mg Oral QHS Vanessa [place], MD   0.8 mg at 10/07/20 2239   [MAR Hold] traZODone (DESYREL) tablet 25 mg  25 mg Oral QHS PRN Mansy, Jan A, MD   25 mg at 10/07/20 2247    Past Medical History:  Diagnosis Date   Anxiety    Dyslipidemia    Enlarged prostate    Hypertension    Prediabetes    TIA (transient ischemic attack)     Past Surgical History:  Procedure Laterality Date   BACK SURGERY     HERNIA REPAIR       Social History   Tobacco Use   Smoking status: Never   Smokeless tobacco: Never  Vaping Use   Vaping Use: Never used  Substance Use Topics   Alcohol use: Yes   Drug use: Never     Family History  Problem Relation Age of Onset   Stroke Neg Hx   No bleeding disorders,  clotting disorders, or aneurysms  No Known Allergies   REVIEW OF SYSTEMS (Negative unless checked)  Constitutional: '[]'$ Weight loss  '[]'$ Fever  '[]'$ Chills Cardiac: '[]'$ Chest pain   '[]'$ Chest pressure   '[]'$ Palpitations   '[]'$ Shortness of breath when laying flat   '[x]'$ Shortness of breath at rest   '[x]'$ Shortness of breath with exertion. Vascular:  '[]'$ Pain in legs with walking   '[]'$ Pain in legs at rest   '[]'$ Pain in legs when laying flat   '[]'$ Claudication   '[]'$ Pain in feet when walking  '[]'$ Pain in feet at rest  '[]'$ Pain in feet when laying flat   '[x]'$ History of DVT   '[x]'$ Phlebitis   '[]'$ Swelling in legs   '[]'$ Varicose veins   '[]'$ Non-healing ulcers Pulmonary:   '[]'$ Uses home oxygen   '[]'$ Productive cough   '[]'$ Hemoptysis   '[]'$ Wheeze  '[]'$ COPD   '[]'$ Asthma Neurologic:  '[]'$ Dizziness  '[]'$ Blackouts   '[]'$ Seizures   '[]'$ History of stroke   '[]'$ History of TIA  '[]'$ Aphasia   '[]'$ Temporary blindness   '[]'$ Dysphagia   '[]'$ Weakness or numbness in arms   '[]'$ Weakness or numbness in legs Musculoskeletal:  '[x]'$ Arthritis   '[]'$ Joint swelling   '[]'$ Joint pain   '[x]'$ Low back pain Hematologic:  '[]'$ Easy bruising  '[]'$ Easy bleeding   '[]'$ Hypercoagulable state   '[]'$ Anemic  '[]'$ Hepatitis Gastrointestinal:  '[]'$ Blood in stool   '[]'$ Vomiting blood  '[]'$ Gastroesophageal reflux/heartburn   '[]'$ Difficulty swallowing. Genitourinary:  '[]'$ Chronic kidney disease   '[]'$ Difficult urination  '[]'$ Frequent urination  '[]'$ Burning with urination   '[]'$ Blood in urine Skin:  '[]'$ Rashes   '[]'$ Ulcers   '[]'$ Wounds Psychological:  '[]'$ History of anxiety   '[]'$  History of major depression.  Physical Examination  Vitals:   10/08/20 0530 10/08/20 0600 10/08/20 0630 10/08/20 0749  BP: (!) 153/85 (!) 167/105 (!) 152/90   Pulse: 76 75 83 88  Resp: 13 19 (!) 25 20  Temp:    98.4 F (36.9 C)  TempSrc:    Oral  SpO2: 94% 98% 93% 96%  Weight:    117.9 kg  Height:       Body mass index is 34.29 kg/m. Gen:  WD/WN, NAD Head: [place]/AT, No temporalis wasting.  Ear/Nose/Throat: Hearing grossly intact, nares w/o erythema or drainage, oropharynx  w/o Erythema/Exudate Eyes: Sclera non-icteric, conjunctiva clear Neck: Trachea midline.  No JVD.  Pulmonary:  Good air movement, respirations somewhat labored on supplemental oxygen Cardiac: RRR, normal S1, S2. Vascular:  Vessel Right Left  Radial Palpable Palpable                                   Gastrointestinal: soft, non-tender/non-distended. No guarding/reflex.  Musculoskeletal: M/S 5/5 throughout.  Extremities without ischemic changes.  No deformity or atrophy.  Mild lower extremity edema Neurologic: Sensation grossly intact in extremities.  Symmetrical.  Speech is fluent. Motor exam as listed above. Psychiatric: Judgment intact, Mood & affect appropriate for pt's clinical situation. Dermatologic: No rashes or ulcers noted.  No cellulitis or open wounds.      CBC Lab Results  Component Value Date   WBC 11.7 (H) 10/07/2020   HGB 15.1 10/07/2020   HCT 44.4 10/07/2020   MCV 87.6 10/07/2020   PLT 200 10/07/2020    BMET    Component Value Date/Time   NA 136 10/07/2020 2041   NA 138 03/16/2020 1000   K 3.7 10/07/2020 2041   CL 97 (L) 10/07/2020 2041   CO2 29 10/07/2020 2041   GLUCOSE 193 (H) 10/07/2020 2041   BUN 18 10/07/2020 2041   BUN 19 03/16/2020 1000   CREATININE 0.90 10/07/2020 2041   CALCIUM 8.9 10/07/2020 2041   GFRNONAA >60 10/07/2020 2041   GFRAA 78 03/16/2020 1000   Estimated Creatinine Clearance: 107.1 mL/min (by C-G formula based on SCr of 0.9 mg/dL).  COAG Lab Results  Component Value Date   INR 1.1 10/07/2020   INR 1.0 09/28/2019   INR 1.1 04/04/2019    Radiology CT HEAD WO CONTRAST (5MM)  Result Date: 10/05/2020 CLINICAL DATA:  Mental status change, unknown cause EXAM: CT HEAD WITHOUT CONTRAST TECHNIQUE: Contiguous axial images were obtained from the base of the skull through the vertex without intravenous contrast. COMPARISON:  09/28/2019 FINDINGS: Brain: There is atrophy and chronic small vessel disease changes. No acute  intracranial abnormality. Specifically, no hemorrhage, hydrocephalus, mass lesion, acute infarction, or significant intracranial injury. Vascular: No hyperdense vessel or unexpected calcification. Skull: No acute calvarial abnormality. Sinuses/Orbits: No acute findings Other: None IMPRESSION: Atrophy, chronic microvascular disease. No acute intracranial abnormality. Electronically Signed   By: Rolm Baptise M.D.   On: 10/05/2020 00:10   MR BRAIN WO CONTRAST  Result Date: 10/01/2020 CLINICAL DATA:  Transient ischemic attack EXAM: MRI HEAD WITHOUT CONTRAST TECHNIQUE: Multiplanar, multiecho pulse sequences of the brain and surrounding structures were obtained without intravenous contrast. COMPARISON:  None. FINDINGS: Brain: No acute infarct, mass effect or extra-axial collection. Multiple chronic microhemorrhages in a predominantly central distribution. Hyperintense T2-weighted signal is moderately widespread throughout the white matter. Advanced atrophy for age. The midline structures are normal. Vascular: Major flow voids are preserved. Skull and upper cervical spine: Normal calvarium and skull base. Visualized upper cervical spine and soft tissues are normal. Sinuses/Orbits:No paranasal sinus fluid levels or advanced mucosal thickening. No mastoid or middle ear effusion. Normal orbits. IMPRESSION: 1. No acute intracranial abnormality. 2. Advanced atrophy and findings of chronic microvascular disease. Electronically Signed   By: Ulyses Jarred M.D.   On: 10/01/2020 03:49   MR LUMBAR SPINE WO CONTRAST  Result Date: 10/01/2020 CLINICAL DATA:  Recent fall EXAM: MRI LUMBAR SPINE WITHOUT CONTRAST TECHNIQUE: Multiplanar, multisequence MR imaging of the lumbar spine was performed. No intravenous contrast was administered. COMPARISON:  01/10/2017 FINDINGS: Segmentation:  Standard. Alignment:  Grade  1 retrolisthesis at L2-3 Vertebrae:  L2-S1 PLIF.  Interbody spacer at L1-2. Conus medullaris and cauda equina: Conus extends  to the L1 level. Conus and cauda equina appear normal. Paraspinal and other soft tissues: Negative. Disc levels: T12-L1: Unremarkable. L1-L2: Small disc bulge, progressed. Interbody spacer. Small amount of fluid in the left facet joint. No spinal canal stenosis. Progression of severe left neural foraminal stenosis. L2-L3: PLIF. Left subarticular disc protrusion is unchanged. Left lateral recess narrowing without central spinal canal stenosis. Unchanged moderate left neural foraminal stenosis. L3-L4: PLIF. No residual disc herniation. Bilateral endplate spurring. No spinal canal stenosis. Improvement of moderate left neural foraminal stenosis. Unchanged moderate right neural foraminal stenosis. L4-L5: Postsurgical changes without residual disc herniation. No spinal canal stenosis. Slightly improved moderate right neural foraminal stenosis. L5-S1: Postsurgical changes without residual disc herniation. No spinal canal stenosis. Unchanged moderate bilateral neural foraminal stenosis. Visualized sacrum: Normal. IMPRESSION: 1. No acute abnormality of the lumbar spine. 2. L2-S1 PLIF without spinal canal stenosis. 3. Progression of severe left L1-2 neural foraminal stenosis. 4. Improvement of moderate left L3-4 and right L4-5 neural foraminal stenosis. 5. Unchanged moderate left L2-3 and bilateral L5-S1 neural foraminal stenosis. Electronically Signed   By: Ulyses Jarred M.D.   On: 10/01/2020 04:01   CT CHEST ABDOMEN PELVIS W CONTRAST  Result Date: 10/07/2020 CLINICAL DATA:  65 year old male with history of nausea and vomiting. EXAM: CT CHEST, ABDOMEN, AND PELVIS WITH CONTRAST TECHNIQUE: Multidetector CT imaging of the chest, abdomen and pelvis was performed following the standard protocol during bolus administration of intravenous contrast. CONTRAST:  170m OMNIPAQUE IOHEXOL 350 MG/ML SOLN COMPARISON:  No priors. FINDINGS: CT CHEST FINDINGS Cardiovascular: There is a large filling defect extending from the distal  pulmonic trunk into both of the main pulmonary arteries bilaterally, as well as several lobar, segmental and subsegmental sized branches, indicative of pulmonary embolism. Heart size is enlarged with right atrial and right ventricular dilatation. Right ventricular diameter estimated at 57 mm. Left ventricular diameter estimated at 46 mm. RV to LV ratio of 1.24. There is no significant pericardial fluid, thickening or pericardial calcification. There is aortic atherosclerosis, as well as atherosclerosis of the great vessels of the mediastinum and the coronary arteries, including calcified atherosclerotic plaque in the left anterior descending, left circumflex and right coronary arteries. Calcifications of the aortic valve. Mediastinum/Nodes: No pathologically enlarged mediastinal or hilar lymph nodes. Esophagus is unremarkable in appearance. No axillary lymphadenopathy. Lungs/Pleura: No acute consolidative airspace disease. Trace right pleural effusion lying dependently. No pneumothorax. No definite suspicious appearing pulmonary nodules or masses are noted. Musculoskeletal: Healing nondisplaced fractures of the anterolateral aspect of the left fifth and sixth ribs. There are no aggressive appearing lytic or blastic lesions noted in the visualized portions of the skeleton. CT ABDOMEN PELVIS FINDINGS Hepatobiliary: No suspicious cystic or solid hepatic lesions. No intra or extrahepatic biliary ductal dilatation. Gallbladder is normal in appearance. Pancreas: No pancreatic mass. No pancreatic ductal dilatation. No pancreatic or peripancreatic fluid collections or inflammatory changes. Spleen: Unremarkable. Adrenals/Urinary Tract: 7 mm nonobstructive calculus in the lower pole collecting system of the left kidney. Multiple low-attenuation lesions in the left kidney, compatible with a combination of simple cysts and parapelvic cysts, largest of which is exophytic in the upper pole of the left kidney measuring up to 2.7  cm in diameter. No hydroureteronephrosis. Urinary bladder is nearly completely decompressed, but otherwise unremarkable in appearance. Bilateral adrenal glands are normal in appearance. Stomach/Bowel: The appearance of the stomach is normal.  No pathologic dilatation of small bowel or colon. Numerous colonic diverticulae are noted, particularly in the distal descending colon and proximal sigmoid colon, without surrounding inflammatory changes to suggest an acute diverticulitis at this time. The appendix is not confidently identified and may be surgically absent. Regardless, there are no inflammatory changes noted adjacent to the cecum to suggest the presence of an acute appendicitis at this time. Vascular/Lymphatic: Aortic atherosclerosis, without evidence of aneurysm or dissection in the abdominal or pelvic vasculature. No lymphadenopathy noted in the abdomen or pelvis. Reproductive: Prostate gland and seminal vesicles are unremarkable in appearance. Other: There is an unusual complex fluid density structure which has what appears to be an operative mass associated within the anterior aspect of the abdomen intimately associated with the anterior abdominal wall (axial image 79 of series 2 and sagittal image 94 of series 6) estimated to measure approximately 16.4 x 9.7 x 14.1 cm, favored to represent a chronic postoperative seroma associated with a now misplaced operative mash, likely for attempted repair of ventral hernia. Immediately to the left of this there is a small ventral hernia containing short segments of mid small bowel. No significant volume of ascites. No pneumoperitoneum. Musculoskeletal: Status post PLIF from L2-S1 with interbody cages at L2-L3, L3-L4, L4-L5 and L5-S1. There are no aggressive appearing lytic or blastic lesions noted in the visualized portions of the skeleton. IMPRESSION: 1. Study is positive for a large saddle embolus extending into lobar, segmental and subsegmental sized branches in  the lungs bilaterally. There is also CT evidence of right heart strain (RV/LV Ratio = 1.24) consistent with at least submassive (intermediate risk) PE. The presence of right heart strain has been associated with an increased risk of morbidity and mortality. The presence of right heart strain has been associated with an increased risk of morbidity and mortality. Please refer to the "PE Focused" order set in EPIC. 2. Trace right pleural effusion lying dependently. 3. No other acute findings are noted elsewhere in the chest, abdomen or pelvis. 4. Unusual fluid collection in the anterior aspect of the abdomen which appears to be associated with and operative mesh, favored to represent a large postoperative seroma associated with mesh for prior attempted repair of ventral hernia. Immediately adjacent to this there is a recurrent ventral hernia which contains a short segment of small bowel. No evidence of associated bowel incarceration or obstruction at this time. 5. 7 mm nonobstructive calculus in the lower pole collecting system of left kidney. 6. Colonic diverticulosis without evidence of acute diverticulitis at this time. 7. Additional incidental findings, as above. Critical Value/emergent results were called by telephone at the time of interpretation on 10/07/2020 at 6:42 pm to provider [facility], who verbally acknowledged these results. Electronically Signed   By: Vinnie Langton M.D.   On: 10/07/2020 18:44   DG Chest Portable 1 View  Result Date: 10/05/2020 CLINICAL DATA:  Weakness EXAM: PORTABLE CHEST 1 VIEW COMPARISON:  None. FINDINGS: Cardiomegaly. No confluent airspace opacities or effusions. No edema. No acute bony abnormality. IMPRESSION: Cardiomegaly.  No active disease. Electronically Signed   By: Rolm Baptise M.D.   On: 10/05/2020 00:08      Assessment/Plan 1.  Bilateral, saddle pulmonary embolus with right heart strain by CT criteria.  Patient is generally unwell and on supplemental oxygen  currently.  I have discussed the situation with the patient in detail.  With submassive pulmonary embolus there is good data supporting thrombectomy for reduction of clot burden to reduce right heart  strain and improve outcomes.  Given his his Hemoccult positive vomitus, we will avoid giving tPA and she do straight thrombectomy. 2.  Hemoccult positive vomitus.  Still needs to be heparinized and monitored closely.  We will avoid tPA. 3.  Prediabetes. blood glucose control important in reducing the progression of atherosclerotic disease. Also, involved in wound healing. On appropriate medications. 4.  Hypertension. blood pressure control important in reducing the progression of atherosclerotic disease. On appropriate oral medications.    Leotis Pain, MD  10/08/2020 8:11 AM    This note was created with Dragon medical transcription system.  Any error is purely unintentional

## 2020-10-09 ENCOUNTER — Inpatient Hospital Stay
Admit: 2020-10-09 | Discharge: 2020-10-09 | Disposition: A | Discharge status: Home / Self Care | Payer: Medicare HMO | Attending: Vascular Surgery | Admitting: Vascular Surgery

## 2020-10-09 DIAGNOSIS — Z0000 Encounter for general adult medical examination without abnormal findings: Secondary | ICD-10-CM

## 2020-10-09 DIAGNOSIS — R627 Adult failure to thrive: Secondary | ICD-10-CM | POA: Diagnosis not present

## 2020-10-09 DIAGNOSIS — I2692 Saddle embolus of pulmonary artery without acute cor pulmonale: Secondary | ICD-10-CM | POA: Diagnosis not present

## 2020-10-09 DIAGNOSIS — E1169 Type 2 diabetes mellitus with other specified complication: Secondary | ICD-10-CM | POA: Diagnosis not present

## 2020-10-09 DIAGNOSIS — E785 Hyperlipidemia, unspecified: Secondary | ICD-10-CM | POA: Diagnosis not present

## 2020-10-09 DIAGNOSIS — I161 Hypertensive emergency: Secondary | ICD-10-CM | POA: Diagnosis not present

## 2020-10-09 DIAGNOSIS — I2699 Other pulmonary embolism without acute cor pulmonale: Secondary | ICD-10-CM | POA: Diagnosis not present

## 2020-10-09 DIAGNOSIS — R262 Difficulty in walking, not elsewhere classified: Secondary | ICD-10-CM | POA: Diagnosis not present

## 2020-10-09 LAB — BASIC METABOLIC PANEL
Anion gap: 11 (ref 5–15)
BUN: 19 mg/dL (ref 8–23)
CO2: 23 mmol/L (ref 22–32)
Calcium: 8.8 mg/dL — ABNORMAL LOW (ref 8.9–10.3)
Chloride: 101 mmol/L (ref 98–111)
Creatinine, Ser: 1.06 mg/dL (ref 0.61–1.24)
GFR, Estimated: >60 mL/min (ref >60)
Glucose, Bld: 178 mg/dL — ABNORMAL HIGH (ref 70–99)
Potassium: 4.5 mmol/L (ref 3.5–5.1)
Sodium: 135 mmol/L (ref 135–145)

## 2020-10-09 LAB — HEPARIN LEVEL (UNFRACTIONATED): Heparin Unfractionated: 0.26 IU/mL — ABNORMAL LOW (ref 0.30–0.70)

## 2020-10-09 LAB — GLUCOSE, CAPILLARY
Glucose-Capillary: 189 mg/dL — ABNORMAL HIGH (ref 70–99)
Glucose-Capillary: 194 mg/dL — ABNORMAL HIGH (ref 70–99)
Glucose-Capillary: 158 mg/dL — ABNORMAL HIGH (ref 70–99)
Glucose-Capillary: 140 mg/dL — ABNORMAL HIGH (ref 70–99)

## 2020-10-09 LAB — CBC
HCT: 43 % (ref 39.0–52.0)
Hemoglobin: 14.3 g/dL (ref 13.0–17.0)
MCH: 29.6 pg (ref 26.0–34.0)
MCHC: 33.3 g/dL (ref 30.0–36.0)
MCV: 89 fL (ref 80.0–100.0)
Platelets: 220 10*3/uL (ref 150–400)
RBC: 4.83 MIL/uL (ref 4.22–5.81)
RDW: 12.8 % (ref 11.5–15.5)
WBC: 9.9 10*3/uL (ref 4.0–10.5)
nRBC: 0 % (ref 0.0–0.2)

## 2020-10-09 LAB — ECHOCARDIOGRAM COMPLETE
AR max vel: 3.05 cm2
AV Area VTI: 3.28 cm2
AV Area mean vel: 3.24 cm2
AV Mean grad: 4 mmHg
AV Peak grad: 8.9 mmHg
Ao pk vel: 1.49 m/s
Area-P 1/2: 2.56 cm2
Height: 73 in
MV VTI: 3.02 cm2
S' Lateral: 3.7 cm
Weight: 4127.01 oz

## 2020-10-09 LAB — TROPONIN I (HIGH SENSITIVITY): Troponin I (High Sensitivity): 28 ng/L — ABNORMAL HIGH (ref <18)

## 2020-10-09 LAB — MAGNESIUM: Magnesium: 1.8 mg/dL (ref 1.7–2.4)

## 2020-10-09 MED ORDER — APIXABAN 5 MG PO TABS: 5.0000 mg | ORAL_TABLET | Freq: Two times a day (BID) | ORAL | Status: DC

## 2020-10-09 MED ORDER — HEPARIN BOLUS VIA INFUSION
1600.0000 [IU] | Freq: Once | INTRAVENOUS | Status: AC
  Administered 2020-10-09: 1600 [IU] via INTRAVENOUS
  Filled 2020-10-09: qty 1600

## 2020-10-09 MED ORDER — PERFLUTREN LIPID MICROSPHERE
1.0000 mL | INTRAVENOUS | Status: AC | PRN
  Administered 2020-10-09: 3 mL via INTRAVENOUS
  Filled 2020-10-09: qty 10

## 2020-10-09 MED ORDER — APIXABAN 5 MG PO TABS
10.0000 mg | ORAL_TABLET | Freq: Two times a day (BID) | ORAL | Status: DC
  Administered 2020-10-09 – 2020-10-12 (×7): 10 mg via ORAL
  Filled 2020-10-09 (×7): qty 2

## 2020-10-09 MED ORDER — PANTOPRAZOLE SODIUM 40 MG PO TBEC
40.0000 mg | DELAYED_RELEASE_TABLET | Freq: Two times a day (BID) | ORAL | Status: DC
  Administered 2020-10-09 – 2020-10-12 (×7): 40 mg via ORAL
  Filled 2020-10-09 (×7): qty 1

## 2020-10-09 NOTE — Progress Notes (Signed)
[facility]  Daily Progress Note   Subjective  -   No major events overnight.  Oxygen saturations have been good.  Says he is breathing well  Objective Vitals:   10/09/20 0725 10/09/20 0751 10/09/20 1203 10/09/20 1500  BP: 125/80 121/81 119/75 122/77  Pulse: 91 79 64 69  Resp: '18 18 18 17  '$ Temp: 98 F (36.7 C) 98 F (36.7 C)  98.1 F (36.7 C)  TempSrc:    Oral  SpO2: 98% 97% 94% 94%  Weight:      Height:        Intake/Output Summary (Last 24 hours) at 10/09/2020 1635 Last data filed at 10/09/2020 1504 Gross per 24 hour  Intake 1340 ml  Output 1100 ml  Net 240 ml    PULM  CTAB CV  RRR VASC  access site is clean, dry, and intact  Laboratory CBC    Component Value Date/Time   WBC 9.9 10/09/2020 0739   HGB 14.3 10/09/2020 0739   HCT 43.0 10/09/2020 0739   PLT 220 10/09/2020 0739    BMET    Component Value Date/Time   NA 135 10/09/2020 0739   NA 138 03/16/2020 1000   K 4.5 10/09/2020 0739   CL 101 10/09/2020 0739   CO2 23 10/09/2020 0739   GLUCOSE 178 (H) 10/09/2020 0739   BUN 19 10/09/2020 0739   BUN 19 03/16/2020 1000   CREATININE 1.06 10/09/2020 0739   CALCIUM 8.8 (L) 10/09/2020 0739   GFRNONAA >60 10/09/2020 0739   GFRAA 78 03/16/2020 1000    Assessment/Planning: POD #1 s/p pulmonary thrombectomy  Seems to be doing okay from a cardiorespiratory standpoint Okay to transition to oral anticoagulation discharge home when medically stable Can follow-up with [place] in the office in about 3 weeks   Leotis Pain  10/09/2020, 4:35 PM

## 2020-10-09 NOTE — Care Management Important Message (Signed)
Important Message  Patient Details  Name: Eleftherios Blystone MRN: [id number] Date of Birth: Dec 24, 1955   Medicare Important Message Given:  Yes     Dannette Barbara 10/09/2020, 3:21 PM

## 2020-10-09 NOTE — Progress Notes (Signed)
[place] for apixaban  Indication: pulmonary embolus  No Known Allergies  Patient Measurements: Height: '6\' 1"'$  (185.4 cm) Weight: 117 kg (257 lb 15 oz) IBW/kg (Calculated) : 79.9 Heparin Dosing Weight: 105.3 kg  Vital Signs: Temp: 98 F (36.7 C) (09/09 0452) Temp Source: Oral (09/08 2014) BP: 161/90 (09/09 0452) Pulse Rate: 65 (09/09 0452)  Labs: Recent Labs    10/06/20 1012 10/07/20 2041 10/08/20 0243 10/08/20 1344 10/08/20 2029 10/08/20 2203  HGB 15.0 15.1  --  13.6 14.2  --   HCT 44.0 44.4  --  39.9 41.5  --   PLT 206 200  --   --   --   --   APTT  --  35  --   --   --   --   LABPROT  --  14.6  --   --   --   --   INR  --  1.1  --   --   --   --   HEPARINUNFRC  --   --  0.94* <0.10*  --  0.49  CREATININE 1.03 0.90  --  1.01  --   --   TROPONINIHS 12 89*  --   --   --   --      Estimated Creatinine Clearance: 95.1 mL/min (by C-G formula based on SCr of 1.01 mg/dL).   Medical History: Past Medical History:  Diagnosis Date   Anxiety    Dyslipidemia    Enlarged prostate    Hypertension    Prediabetes    TIA (transient ischemic attack)     Medications:  Medications Prior to Admission  Medication Sig Dispense Refill Last Dose   amLODipine (NORVASC) 5 MG tablet Take 1 tablet (5 mg total) by mouth daily. 30 tablet 0 unknown at unknown   clopidogrel (PLAVIX) 75 MG tablet Take 75 mg by mouth daily.   unknown at unknown   lisinopril (ZESTRIL) 40 MG tablet Take 40 mg by mouth at bedtime.    unknown at unknown   metFORMIN (GLUCOPHAGE-XR) 500 MG 24 hr tablet Take 1,000 mg by mouth 2 (two) times daily.   unknown at unknown   tamsulosin (FLOMAX) 0.4 MG CAPS capsule Take 0.8 mg by mouth at bedtime.       acetaminophen (TYLENOL) 500 MG tablet Take 500 mg by mouth every 6 (six) hours as needed.    prn at prn   atorvastatin (LIPITOR) 40 MG tablet Take 1 tablet (40 mg total) by mouth daily at 6 PM. 30 tablet 0    Cholecalciferol 25  MCG (1000 UT) tablet Take 5,000 Units by mouth daily.       PARoxetine (PAXIL) 40 MG tablet Take 40 mg by mouth at bedtime.    unknown at unknown   Potassium 99 MG TABS Take by mouth daily.      sildenafil (REVATIO) 20 MG tablet Take 20 mg by mouth daily as needed.      Scheduled:   amLODipine  5 mg Oral Daily   atorvastatin  40 mg Oral q1800   cholecalciferol  5,000 Units Oral Daily   clopidogrel  75 mg Oral Daily   insulin aspart  0-9 Units Subcutaneous TID AC & HS   lisinopril  40 mg Oral QHS   metFORMIN  1,000 mg Oral BID WC   pantoprazole (PROTONIX) IV  40 mg Intravenous Q12H   PARoxetine  40 mg Oral QHS   tamsulosin  0.8 mg  Oral QHS   Infusions:   0.9 % NaCl with KCl 20 mEq / L 75 mL/hr at 10/09/20 0449   heparin 1,600 Units/hr (10/09/20 0719)   promethazine (PHENERGAN) injection (IM or IVPB) 12.5 mg (10/08/20 2234)   PRN: acetaminophen **OR** acetaminophen, labetalol, magnesium hydroxide, ondansetron **OR** ondansetron (ZOFRAN) IV, promethazine (PHENERGAN) injection (IM or IVPB), traZODone Anti-infectives (From admission, onward)    Start     Dose/Rate Route Frequency Ordered Stop   10/08/20 0830  ceFAZolin (ANCEF) IVPB 2g/100 mL premix        2 g 200 mL/hr over 30 Minutes Intravenous  Once 10/08/20 0826 10/08/20 1313       Assessment: Keith Carter with pertinent cardiac PMH including HTN, TIA and CVA presenting with weakness on 8/31. CT scan ordered which was positive for "large saddle embolus extending into lobar, segmental and subsegmental sized branches in the lungs Bilaterally" and submassive PE. A heparin infusion was started.   Pt underwent thrombectomy on 9/8, and heparin was continued once pt had returned from surgery.  Goal of Therapy:  Monitor platelets by anticoagulation protocol: Yes   Plan:  -Will discontinue heparin infusion -Will start apixaban '10mg'$  twice daily for 7 days, then '5mg'$  twice daily  -Obtain CBCs every 3 days while in hospital on apixaban    Narda Rutherford, PharmD Pharmacy Resident  10/09/2020 1:09 PM

## 2020-10-09 NOTE — Evaluation (Signed)
Occupational Therapy Evaluation Patient Details Name: Keith Carter MRN: [id number] DOB: 1955-08-09 Today's Date: 10/09/2020    History of Present Illness 65 y.o. Caucasian male with medical history significant for hypertension, BPH,  dyslipidemia, and anxiety, TIA and DM2, who initially present to the emergency from 9/5 with weakness and falls.  While in the emergency room, patient was found to have hypoxemia, short of breath.  CT chest with contrast was obtained, showed a saddle PE and bilateral pulmonary emboli.  Patient was placed on heparin drip.  He he was seen by vascular surgery, pulmonary thrombectomy was performed on 9/8   Clinical Impression   Patient presenting with decreased Ind in self care, balance, functional mobility/transfers, endurance, and safety awareness. Patient reports being mod I with use of RW and SPC for functional mobility PTA.He lives with his wife and provides assistance for her as needed. He drives and performs all IADL tasks at home.  Patient currently functioning at mod A for functional mobility tasks. Pt fatigues very quickly and reports feeling very sleepy throughout session. He would not be safe to return home at this level. Patient will benefit from acute OT to increase overall independence in the areas of ADLs, functional mobility, and safety awareness in order to safely discharge to next venue of care.     Follow Up Recommendations  SNF;Supervision/Assistance - 24 hour    Equipment Recommendations  Other (comment) (defer to next venue of care)       Precautions / Restrictions Precautions Precautions: Fall Precaution Comments: high fall risk      Mobility Bed Mobility Overal bed mobility: Needs Assistance Bed Mobility: Supine to Sit;Sit to Supine     Supine to sit: Min assist Sit to supine: Min assist   General bed mobility comments: increased time and min cuing for technique and hand placement with min A for trunk support    Transfers Overall  transfer level: Needs assistance Equipment used: 1 person hand held assist Transfers: Sit to/from Stand Sit to Stand: Mod assist         General transfer comment: mod A to stand from bed and mod A with very short shuffling steps along EOB    Balance Overall balance assessment: History of Falls;Needs assistance Sitting-balance support: Feet supported;Bilateral upper extremity supported Sitting balance-Leahy Scale: Fair Sitting balance - Comments: min guard   Standing balance support: During functional activity Standing balance-Leahy Scale: Poor Standing balance comment: mod A for safety with posterior bias in standing                           ADL either performed or assessed with clinical judgement      Vision Patient Visual Report: No change from baseline              Pertinent Vitals/Pain Pain Assessment: No/denies pain     Hand Dominance Right   Extremity/Trunk Assessment Upper Extremity Assessment Upper Extremity Assessment: Generalized weakness   Lower Extremity Assessment Lower Extremity Assessment: Generalized weakness       Communication Communication Communication: No difficulties   Cognition Arousal/Alertness: Awake/alert Behavior During Therapy: Flat affect Overall Cognitive Status: Impaired/Different from baseline                                 General Comments: Pt appears to be confused regarding his current functional status and speech is more difficult to understand  at times.              Home Living Family/patient expects to be discharged to:: Private residence Living Arrangements: Spouse/significant other Available Help at Discharge: Family;Available PRN/intermittently Type of Home: Mobile home Home Access: Ramped entrance     Home Layout: One level     Bathroom Shower/Tub: Occupational psychologist: Standard     Home Equipment: Environmental consultant - 2 wheels;Wheelchair - manual          Prior  Functioning/Environment Level of Independence: Needs assistance  Gait / Transfers Assistance Needed: Pt reports use of SPC and RW at baseline. Unable to describe when he uses one vs the other. ADL's / Homemaking Assistance Needed: Pt reports being independent in self care tasks   Comments: Pt reports he performs all IADL tasks at home, drives, and occasionally assists wife (who has parkinsons).        OT Problem List: Decreased strength;Decreased coordination;Decreased cognition;Decreased activity tolerance;Decreased safety awareness;Impaired balance (sitting and/or standing);Decreased knowledge of use of DME or AE;Decreased knowledge of precautions      OT Treatment/Interventions: Self-care/ADL training;Therapeutic exercise;Therapeutic activities;Energy conservation;Patient/family education;DME and/or AE instruction;Balance training;Manual therapy;Cognitive remediation/compensation    OT Goals(Current goals can be found in the care plan section) Acute Rehab OT Goals Patient Stated Goal: To go home to be caregiver for wife OT Goal Formulation: With patient Time For Goal Achievement: 10/23/20 Potential to Achieve Goals: Fair ADL Goals Pt Will Perform Grooming: with supervision;standing Pt Will Perform Lower Body Dressing: with supervision;sit to/from stand Pt Will Transfer to Toilet: with supervision;bedside commode Pt Will Perform Toileting - Clothing Manipulation and hygiene: with supervision;sit to/from stand  OT Frequency: Min 2X/week   Barriers to D/C: Decreased caregiver support             AM-PAC OT "6 Clicks" Daily Activity     Outcome Measure Help from another person eating meals?: None Help from another person taking care of personal grooming?: A Little Help from another person toileting, which includes using toliet, bedpan, or urinal?: A Lot Help from another person bathing (including washing, rinsing, drying)?: A Lot Help from another person to put on and taking off  regular upper body clothing?: A Little Help from another person to put on and taking off regular lower body clothing?: A Lot 6 Click Score: 16   End of Session Equipment Utilized During Treatment: Rolling walker Nurse Communication: Mobility status  Activity Tolerance: Patient tolerated treatment well Patient left: in bed;with call bell/phone within reach;with bed alarm set  OT Visit Diagnosis: Unsteadiness on feet (R26.81);Repeated falls (R29.6);Muscle weakness (generalized) (M62.81)                Time: [id number] OT Time Calculation (min): 21 min Charges:  OT General Charges $OT Visit: 1 Visit OT Evaluation $OT Eval Moderate Complexity: 1 Mod OT Treatments $Therapeutic Activity: 8-22 mins  Darleen Crocker, MS, OTR/L , CBIS ascom [phone]  10/09/20, 11:20 AM

## 2020-10-09 NOTE — Progress Notes (Signed)
PROGRESS NOTE    Keith Carter  [id number] DOB: 03-01-1955 DOA: 10/04/2020 PCP: [name], NP   Chief complaint.  General weakness. Brief Narrative:  Keith Carter is a 65 y.o. Caucasian male with medical history significant for hypertension, BPH,  dyslipidemia, and anxiety, TIA and DM2, who initially present to the emergency from 9/5 with weakness and falls.  While in the emergency room, patient was found to have hypoxemia, short of breath.  CT chest with contrast was obtained, showed a saddle PE and bilateral pulmonary emboli.  Patient was placed on heparin drip.  He he was seen by vascular surgery, pulmonary thrombectomy was performed on 9/8   Assessment & Plan:   Active Problems:   Type 2 diabetes mellitus with hyperlipidemia (HCC)   History of stroke   Hypertension   Acute massive pulmonary embolism ([place])   Hypertensive emergency  #1.  Acute saddle PE. Right lower extremity DVT. Status post pulmonary thrombectomy. Condition stable.  Will change anticoagulation to Eliquis.  #2.  Uncontrolled type 2 diabetes with hyperglycemia  Continue sliding scale insulin.    #3.  Failure to thrive. Frequent falls. Generalized weakness. Continue PT/OT.   #4.  Nausea vomiting. Patient does not have active bleeding.  Change Protonix to oral twice a day.  5.  Hypertension emergency. Continue home medicines.       DVT prophylaxis: Eliquis Code Status:  Family Communication: DNR Disposition Plan:    Status is: Inpatient  Remains inpatient appropriate because:Inpatient level of care appropriate due to severity of illness  Dispo: The patient is from: Home              Anticipated d/c is to: SNF              Patient currently is not medically stable to d/c.   Difficult to place patient No        I/O last 3 completed shifts: In: 2521.5 [P.O.:960; I.V.:1361.4; IV Piggyback:200.1] Out: 900 [Urine:900] Total I/O In: 380 [P.O.:380] Out: -       Consultants:  Vascular  Procedures: Pulmonary thrombectomy.  Antimicrobials: None  Subjective: Spoke with the nurse, patient was able to eat 50% of meal this morning. He still has significant weakness. He denies any short of breath or cough. No chest pain or palpitation. No dysuria hematuria  No fever or chills.  Objective: Vitals:   10/09/20 0034 10/09/20 0452 10/09/20 0725 10/09/20 0751  BP: (!) 142/96 (!) 161/90 125/80 121/81  Pulse: 71 65 91 79  Resp: '18 18 18 18  '$ Temp: (!) 97 F (36.1 C) 98 F (36.7 C) 98 F (36.7 C) 98 F (36.7 C)  TempSrc:      SpO2: 90% 95% 98% 97%  Weight:      Height:        Intake/Output Summary (Last 24 hours) at 10/09/2020 1049 Last data filed at 10/09/2020 1000 Gross per 24 hour  Intake 2801.37 ml  Output 900 ml  Net 1901.37 ml   Filed Weights   10/04/20 2321 10/08/20 0749 10/08/20 1315  Weight: 117.9 kg 117.9 kg 117 kg    Examination:  General exam: Appears calm and comfortable  Respiratory system: Clear to auscultation. Respiratory effort normal. Cardiovascular system: S1 & S2 heard, RRR. No JVD, murmurs, rubs, gallops or clicks. No pedal edema. Gastrointestinal system: Abdomen is nondistended, soft and nontender. No organomegaly or masses felt. Normal bowel sounds heard. Central nervous system: Alert and oriented x2. No focal neurological deficits.  Extremities: Symmetric 5 x 5 power. Skin: No rashes, lesions or ulcers Psychiatry:  Mood & affect appropriate.     Data Reviewed: I have personally reviewed following labs and imaging studies  CBC: Recent Labs  Lab 10/05/20 0011 10/06/20 1012 10/07/20 2041 10/08/20 1344 10/08/20 2029 10/09/20 0739  WBC 9.4 10.6* 11.7*  --   --  9.9  NEUTROABS 7.4 9.5*  --   --   --   --   HGB 14.4 15.0 15.1 13.6 14.2 14.3  HCT 42.5 44.0 44.4 39.9 41.5 43.0  MCV 89.3 88.2 87.6  --   --  89.0  PLT 206 206 200  --   --  [id number]   Basic Metabolic Panel: Recent Labs  Lab 10/05/20 0011  10/06/20 1012 10/07/20 2041 10/08/20 1344  NA 139 139 136 135  K 3.8 3.5 3.7 3.6  CL 106 101 97* 100  CO2 '27 27 29 28  '$ GLUCOSE 250* 255* 193* 179*  BUN '17 14 18 18  '$ CREATININE 1.10 1.03 0.90 1.01  CALCIUM 9.2 9.0 8.9 8.4*   GFR: Estimated Creatinine Clearance: 95.1 mL/min (by C-G formula based on SCr of 1.01 mg/dL). Liver Function Tests: Recent Labs  Lab 10/05/20 0011 10/06/20 1012  AST 16 15  ALT 16 14  ALKPHOS 86 81  BILITOT 1.4* 1.6*  PROT 6.5 6.7  ALBUMIN 3.4* 3.4*   No results for input(s): LIPASE, AMYLASE in the last 168 hours. No results for input(s): AMMONIA in the last 168 hours. Coagulation Profile: Recent Labs  Lab 10/07/20 2041  INR 1.1   Cardiac Enzymes: No results for input(s): CKTOTAL, CKMB, CKMBINDEX, TROPONINI in the last 168 hours. BNP (last 3 results) No results for input(s): PROBNP in the last 8760 hours. HbA1C: Recent Labs    10/07/20 2041  HGBA1C 9.0*   CBG: Recent Labs  Lab 10/07/20 2234 10/08/20 0957 10/08/20 1618 10/08/20 2116 10/09/20 0753  GLUCAP 191* 221* 154* 193* 189*   Lipid Profile: No results for input(s): CHOL, HDL, LDLCALC, TRIG, CHOLHDL, LDLDIRECT in the last 72 hours. Thyroid Function Tests: No results for input(s): TSH, T4TOTAL, FREET4, T3FREE, THYROIDAB in the last 72 hours. Anemia Panel: No results for input(s): VITAMINB12, FOLATE, FERRITIN, TIBC, IRON, RETICCTPCT in the last 72 hours. Sepsis Labs: Recent Labs  Lab 10/05/20 0011  LATICACIDVEN 1.6    Recent Results (from the past 240 hour(s))  Resp Panel by RT-PCR (Flu A&B, Covid) Nasopharyngeal Swab     Status: None   Collection Time: 10/01/20  4:08 AM   Specimen: Nasopharyngeal Swab; Nasopharyngeal(NP) swabs in vial transport medium  Result Value Ref Range Status   SARS Coronavirus 2 by RT PCR NEGATIVE NEGATIVE Final    Comment: (NOTE) SARS-CoV-2 target nucleic acids are NOT DETECTED.  The SARS-CoV-2 RNA is generally detectable in upper  respiratory specimens during the acute phase of infection. The lowest concentration of SARS-CoV-2 viral copies this assay can detect is 138 copies/mL. A negative result does not preclude SARS-Cov-2 infection and should not be used as the sole basis for treatment or other patient management decisions. A negative result may occur with  improper specimen collection/handling, submission of specimen other than nasopharyngeal swab, presence of viral mutation(s) within the areas targeted by this assay, and inadequate number of viral copies(<138 copies/mL). A negative result must be combined with clinical observations, patient history, and epidemiological information. The expected result is Negative.  Fact Sheet for Patients:  EntrepreneurPulse.com.au  Fact Sheet for Healthcare Providers:  IncredibleEmployment.be  This test is no t yet approved or cleared by the [place] and  has been authorized for detection and/or diagnosis of SARS-CoV-2 by FDA under an Emergency Use Authorization (EUA). This EUA will remain  in effect (meaning this test can be used) for the duration of the COVID-19 declaration under Section 564(b)(1) of the Act, 21 U.S.C.section 360bbb-3(b)(1), unless the authorization is terminated  or revoked sooner.       Influenza A by PCR NEGATIVE NEGATIVE Final   Influenza B by PCR NEGATIVE NEGATIVE Final    Comment: (NOTE) The Xpert Xpress SARS-CoV-2/FLU/RSV plus assay is intended as an aid in the diagnosis of influenza from Nasopharyngeal swab specimens and should not be used as a sole basis for treatment. Nasal washings and aspirates are unacceptable for Xpert Xpress SARS-CoV-2/FLU/RSV testing.  Fact Sheet for Patients: EntrepreneurPulse.com.au  Fact Sheet for Healthcare Providers: IncredibleEmployment.be  This test is not yet approved or cleared by the [place] FDA and has been  authorized for detection and/or diagnosis of SARS-CoV-2 by FDA under an Emergency Use Authorization (EUA). This EUA will remain in effect (meaning this test can be used) for the duration of the COVID-19 declaration under Section 564(b)(1) of the Act, 21 U.S.C. section 360bbb-3(b)(1), unless the authorization is terminated or revoked.  Performed at [facility], [address] [place], [place] [address]   Resp Panel by RT-PCR (Flu A&B, Covid) Nasopharyngeal Swab     Status: None   Collection Time: 10/04/20 11:31 PM   Specimen: Nasopharyngeal Swab; Nasopharyngeal(NP) swabs in vial transport medium  Result Value Ref Range Status   SARS Coronavirus 2 by RT PCR NEGATIVE NEGATIVE Final    Comment: (NOTE) SARS-CoV-2 target nucleic acids are NOT DETECTED.  The SARS-CoV-2 RNA is generally detectable in upper respiratory specimens during the acute phase of infection. The lowest concentration of SARS-CoV-2 viral copies this assay can detect is 138 copies/mL. A negative result does not preclude SARS-Cov-2 infection and should not be used as the sole basis for treatment or other patient management decisions. A negative result may occur with  improper specimen collection/handling, submission of specimen other than nasopharyngeal swab, presence of viral mutation(s) within the areas targeted by this assay, and inadequate number of viral copies(<138 copies/mL). A negative result must be combined with clinical observations, patient history, and epidemiological information. The expected result is Negative.  Fact Sheet for Patients:  EntrepreneurPulse.com.au  Fact Sheet for Healthcare Providers:  IncredibleEmployment.be  This test is no t yet approved or cleared by the [place] FDA and  has been authorized for detection and/or diagnosis of SARS-CoV-2 by FDA under an Emergency Use Authorization (EUA). This EUA will remain  in effect (meaning this  test can be used) for the duration of the COVID-19 declaration under Section 564(b)(1) of the Act, 21 U.S.C.section 360bbb-3(b)(1), unless the authorization is terminated  or revoked sooner.       Influenza A by PCR NEGATIVE NEGATIVE Final   Influenza B by PCR NEGATIVE NEGATIVE Final    Comment: (NOTE) The Xpert Xpress SARS-CoV-2/FLU/RSV plus assay is intended as an aid in the diagnosis of influenza from Nasopharyngeal swab specimens and should not be used as a sole basis for treatment. Nasal washings and aspirates are unacceptable for Xpert Xpress SARS-CoV-2/FLU/RSV testing.  Fact Sheet for Patients: EntrepreneurPulse.com.au  Fact Sheet for Healthcare Providers: IncredibleEmployment.be  This test is not yet approved or cleared by the [place] FDA and has been authorized for detection and/or diagnosis  of SARS-CoV-2 by FDA under an Emergency Use Authorization (EUA). This EUA will remain in effect (meaning this test can be used) for the duration of the COVID-19 declaration under Section 564(b)(1) of the Act, 21 U.S.C. section 360bbb-3(b)(1), unless the authorization is terminated or revoked.  Performed at [facility], [place], [place] [address]   SARS CORONAVIRUS 2 (TAT 6-24 HRS) Nasopharyngeal Nasopharyngeal Swab     Status: None   Collection Time: 10/07/20 10:06 PM   Specimen: Nasopharyngeal Swab  Result Value Ref Range Status   SARS Coronavirus 2 NEGATIVE NEGATIVE Final    Comment: (NOTE) SARS-CoV-2 target nucleic acids are NOT DETECTED.  The SARS-CoV-2 RNA is generally detectable in upper and lower respiratory specimens during the acute phase of infection. Negative results do not preclude SARS-CoV-2 infection, do not rule out co-infections with other pathogens, and should not be used as the sole basis for treatment or other patient management decisions. Negative results must be combined with clinical  observations, patient history, and epidemiological information. The expected result is Negative.  Fact Sheet for Patients: SugarRoll.be  Fact Sheet for Healthcare Providers: https://www.woods-mathews.com/  This test is not yet approved or cleared by the [place] FDA and  has been authorized for detection and/or diagnosis of SARS-CoV-2 by FDA under an Emergency Use Authorization (EUA). This EUA will remain  in effect (meaning this test can be used) for the duration of the COVID-19 declaration under Se ction 564(b)(1) of the Act, 21 U.S.C. section 360bbb-3(b)(1), unless the authorization is terminated or revoked sooner.  Performed at [facility], [place] [address] [place], [place] [address]          Radiology Studies: CT CHEST ABDOMEN PELVIS W CONTRAST  Result Date: 10/07/2020 CLINICAL DATA:  65 year old male with history of nausea and vomiting. EXAM: CT CHEST, ABDOMEN, AND PELVIS WITH CONTRAST TECHNIQUE: Multidetector CT imaging of the chest, abdomen and pelvis was performed following the standard protocol during bolus administration of intravenous contrast. CONTRAST:  125m OMNIPAQUE IOHEXOL 350 MG/ML SOLN COMPARISON:  No priors. FINDINGS: CT CHEST FINDINGS Cardiovascular: There is a large filling defect extending from the distal pulmonic trunk into both of the main pulmonary arteries bilaterally, as well as several lobar, segmental and subsegmental sized branches, indicative of pulmonary embolism. Heart size is enlarged with right atrial and right ventricular dilatation. Right ventricular diameter estimated at 57 mm. Left ventricular diameter estimated at 46 mm. RV to LV ratio of 1.24. There is no significant pericardial fluid, thickening or pericardial calcification. There is aortic atherosclerosis, as well as atherosclerosis of the great vessels of the mediastinum and the coronary arteries, including calcified atherosclerotic plaque in  the left anterior descending, left circumflex and right coronary arteries. Calcifications of the aortic valve. Mediastinum/Nodes: No pathologically enlarged mediastinal or hilar lymph nodes. Esophagus is unremarkable in appearance. No axillary lymphadenopathy. Lungs/Pleura: No acute consolidative airspace disease. Trace right pleural effusion lying dependently. No pneumothorax. No definite suspicious appearing pulmonary nodules or masses are noted. Musculoskeletal: Healing nondisplaced fractures of the anterolateral aspect of the left fifth and sixth ribs. There are no aggressive appearing lytic or blastic lesions noted in the visualized portions of the skeleton. CT ABDOMEN PELVIS FINDINGS Hepatobiliary: No suspicious cystic or solid hepatic lesions. No intra or extrahepatic biliary ductal dilatation. Gallbladder is normal in appearance. Pancreas: No pancreatic mass. No pancreatic ductal dilatation. No pancreatic or peripancreatic fluid collections or inflammatory changes. Spleen: Unremarkable. Adrenals/Urinary Tract: 7 mm nonobstructive calculus in the  lower pole collecting system of the left kidney. Multiple low-attenuation lesions in the left kidney, compatible with a combination of simple cysts and parapelvic cysts, largest of which is exophytic in the upper pole of the left kidney measuring up to 2.7 cm in diameter. No hydroureteronephrosis. Urinary bladder is nearly completely decompressed, but otherwise unremarkable in appearance. Bilateral adrenal glands are normal in appearance. Stomach/Bowel: The appearance of the stomach is normal. No pathologic dilatation of small bowel or colon. Numerous colonic diverticulae are noted, particularly in the distal descending colon and proximal sigmoid colon, without surrounding inflammatory changes to suggest an acute diverticulitis at this time. The appendix is not confidently identified and may be surgically absent. Regardless, there are no inflammatory changes noted  adjacent to the cecum to suggest the presence of an acute appendicitis at this time. Vascular/Lymphatic: Aortic atherosclerosis, without evidence of aneurysm or dissection in the abdominal or pelvic vasculature. No lymphadenopathy noted in the abdomen or pelvis. Reproductive: Prostate gland and seminal vesicles are unremarkable in appearance. Other: There is an unusual complex fluid density structure which has what appears to be an operative mass associated within the anterior aspect of the abdomen intimately associated with the anterior abdominal wall (axial image 79 of series 2 and sagittal image 94 of series 6) estimated to measure approximately 16.4 x 9.7 x 14.1 cm, favored to represent a chronic postoperative seroma associated with a now misplaced operative mash, likely for attempted repair of ventral hernia. Immediately to the left of this there is a small ventral hernia containing short segments of mid small bowel. No significant volume of ascites. No pneumoperitoneum. Musculoskeletal: Status post PLIF from L2-S1 with interbody cages at L2-L3, L3-L4, L4-L5 and L5-S1. There are no aggressive appearing lytic or blastic lesions noted in the visualized portions of the skeleton. IMPRESSION: 1. Study is positive for a large saddle embolus extending into lobar, segmental and subsegmental sized branches in the lungs bilaterally. There is also CT evidence of right heart strain (RV/LV Ratio = 1.24) consistent with at least submassive (intermediate risk) PE. The presence of right heart strain has been associated with an increased risk of morbidity and mortality. The presence of right heart strain has been associated with an increased risk of morbidity and mortality. Please refer to the "PE Focused" order set in EPIC. 2. Trace right pleural effusion lying dependently. 3. No other acute findings are noted elsewhere in the chest, abdomen or pelvis. 4. Unusual fluid collection in the anterior aspect of the abdomen which  appears to be associated with and operative mesh, favored to represent a large postoperative seroma associated with mesh for prior attempted repair of ventral hernia. Immediately adjacent to this there is a recurrent ventral hernia which contains a short segment of small bowel. No evidence of associated bowel incarceration or obstruction at this time. 5. 7 mm nonobstructive calculus in the lower pole collecting system of left kidney. 6. Colonic diverticulosis without evidence of acute diverticulitis at this time. 7. Additional incidental findings, as above. Critical Value/emergent results were called by telephone at the time of interpretation on 10/07/2020 at 6:42 pm to provider [facility], who verbally acknowledged these results. Electronically Signed   By: Vinnie Langton M.D.   On: 10/07/2020 18:44   PERIPHERAL VASCULAR CATHETERIZATION  Result Date: 10/08/2020 See surgical note for result.  US Venous Img Lower Bilateral (DVT)  Result Date: 10/08/2020 CLINICAL DATA:  Positive CT for pulmonary emboli EXAM: BILATERAL LOWER EXTREMITY VENOUS DOPPLER ULTRASOUND TECHNIQUE: Gray-scale sonography with  graded compression, as well as color Doppler and duplex ultrasound were performed to evaluate the lower extremity deep venous systems from the level of the common femoral vein and including the common femoral, femoral, profunda femoral, popliteal and calf veins including the posterior tibial, peroneal and gastrocnemius veins when visible. The superficial great saphenous vein was also interrogated. Spectral Doppler was utilized to evaluate flow at rest and with distal augmentation maneuvers in the common femoral, femoral and popliteal veins. COMPARISON:  None. FINDINGS: RIGHT LOWER EXTREMITY Common Femoral Vein: No evidence of thrombus. Normal compressibility, respiratory phasicity and response to augmentation. Saphenofemoral Junction: No evidence of thrombus. Normal compressibility and flow on color Doppler imaging.  Profunda Femoral Vein: No evidence of thrombus. Normal compressibility and flow on color Doppler imaging. Femoral Vein: Proximal and mid portions of the femoral vein appear patent and compressible. Distal femoral vein is noncompressible with hypoechoic thrombus appearing nonocclusive. Phasic flow maintained. Popliteal Vein: Mixed echogenicity intraluminal thrombus appearing nonocclusive. Vessel is noncompressible. There is some phasic flow present. Calf Veins: Thrombus does appear to extend into the tibial and peroneal calf veins. LEFT LOWER EXTREMITY Common Femoral Vein: No evidence of thrombus. Normal compressibility, respiratory phasicity and response to augmentation. Saphenofemoral Junction: No evidence of thrombus. Normal compressibility and flow on color Doppler imaging. Profunda Femoral Vein: No evidence of thrombus. Normal compressibility and flow on color Doppler imaging. Femoral Vein: No evidence of thrombus. Normal compressibility, respiratory phasicity and response to augmentation. Popliteal Vein: No evidence of thrombus. Normal compressibility, respiratory phasicity and response to augmentation. Calf Veins: No evidence of thrombus. Normal compressibility and flow on color Doppler imaging. IMPRESSION: Positive exam for right distal femoropopliteal DVT extending into the calf tibial and peroneal veins. No evidence of left lower extremity DVT. Electronically Signed   By: Jerilynn Mages.  Shick M.D.   On: 10/08/2020 10:40        Scheduled Meds:  amLODipine  5 mg Oral Daily   atorvastatin  40 mg Oral q1800   cholecalciferol  5,000 Units Oral Daily   clopidogrel  75 mg Oral Daily   insulin aspart  0-9 Units Subcutaneous TID AC & HS   lisinopril  40 mg Oral QHS   metFORMIN  1,000 mg Oral BID WC   pantoprazole (PROTONIX) IV  40 mg Intravenous Q12H   PARoxetine  40 mg Oral QHS   tamsulosin  0.8 mg Oral QHS   Continuous Infusions:  heparin 1,800 Units/hr (10/09/20 0929)   promethazine (PHENERGAN) injection  (IM or IVPB) 12.5 mg (10/08/20 2234)     LOS: 2 days    Time spent: 28 minutes    Sharen Hones, MD [facility]   To contact the attending provider between 7A-7P or the covering provider during after hours 7P-7A, please log into the web site [email or web address] and access using universal Green password for that web site. If you do not have the password, please call the hospital operator.  10/09/2020, 10:49 AM

## 2020-10-09 NOTE — Progress Notes (Signed)
*  PRELIMINARY RESULTS* Echocardiogram 2D Echocardiogram has been performed.  Keith Carter 10/09/2020, 12:39 PM

## 2020-10-10 DIAGNOSIS — E785 Hyperlipidemia, unspecified: Secondary | ICD-10-CM | POA: Diagnosis not present

## 2020-10-10 DIAGNOSIS — I2699 Other pulmonary embolism without acute cor pulmonale: Secondary | ICD-10-CM | POA: Diagnosis not present

## 2020-10-10 DIAGNOSIS — I161 Hypertensive emergency: Secondary | ICD-10-CM | POA: Diagnosis not present

## 2020-10-10 DIAGNOSIS — E1169 Type 2 diabetes mellitus with other specified complication: Secondary | ICD-10-CM | POA: Diagnosis not present

## 2020-10-10 LAB — GLUCOSE, CAPILLARY
Glucose-Capillary: 145 mg/dL — ABNORMAL HIGH (ref 70–99)
Glucose-Capillary: 128 mg/dL — ABNORMAL HIGH (ref 70–99)
Glucose-Capillary: 170 mg/dL — ABNORMAL HIGH (ref 70–99)
Glucose-Capillary: 125 mg/dL — ABNORMAL HIGH (ref 70–99)

## 2020-10-10 LAB — BASIC METABOLIC PANEL
Anion gap: 7 (ref 5–15)
BUN: 16 mg/dL (ref 8–23)
CO2: 24 mmol/L (ref 22–32)
Calcium: 8.4 mg/dL — ABNORMAL LOW (ref 8.9–10.3)
Chloride: 104 mmol/L (ref 98–111)
Creatinine, Ser: 0.81 mg/dL (ref 0.61–1.24)
GFR, Estimated: >60 mL/min (ref >60)
Glucose, Bld: 135 mg/dL — ABNORMAL HIGH (ref 70–99)
Potassium: 3.5 mmol/L (ref 3.5–5.1)
Sodium: 135 mmol/L (ref 135–145)

## 2020-10-10 LAB — MAGNESIUM: Magnesium: 1.7 mg/dL (ref 1.7–2.4)

## 2020-10-10 MED ORDER — MEGESTROL ACETATE 400 MG/10ML PO SUSP
400.0000 mg | Freq: Every day | ORAL | Status: DC
  Administered 2020-10-10 – 2020-10-12 (×3): 400 mg via ORAL
  Filled 2020-10-10 (×3): qty 10

## 2020-10-10 MED ORDER — LACTULOSE 10 GM/15ML PO SOLN
20.0000 g | Freq: Once | ORAL | Status: AC
  Administered 2020-10-10: 20 g via ORAL
  Filled 2020-10-10: qty 30

## 2020-10-10 MED ORDER — SENNOSIDES-DOCUSATE SODIUM 8.6-50 MG PO TABS
2.0000 | ORAL_TABLET | Freq: Two times a day (BID) | ORAL | Status: DC
  Administered 2020-10-10 – 2020-10-12 (×5): 2 via ORAL
  Filled 2020-10-10 (×5): qty 2

## 2020-10-10 MED ORDER — POTASSIUM CHLORIDE 20 MEQ PO PACK
40.0000 meq | PACK | Freq: Once | ORAL | Status: AC
  Administered 2020-10-10: 40 meq via ORAL
  Filled 2020-10-10: qty 2

## 2020-10-10 NOTE — TOC Progression Note (Addendum)
Transition of Care [facility]) - Progression Note    Patient Details  Name: Keith Carter MRN: [id number] Date of Birth: 17-Dec-1955  Transition of Care [facility]) CM/SW [place] [place] Phone Number: 10/10/2020, 9:26 AM  Clinical Narrative:     Update: Patient has insurance auth and per Juliann Pulse with [facility] they can accept patient. CSW spoke with MD who reports patient is not eating now so will plan for Monday discharge. CSW has informed Juliann Pulse with [facility].     CSW has reached out to [facility] with [facility] for an update on patient's insurance auth. She reports she will check and get back to CSW.   Expected Discharge Plan: [place] Barriers to Discharge: Continued Medical Work up  Expected Discharge Plan and Services Expected Discharge Plan: [place] arrangements for the past 2 months: Single Family Home                                       Social Determinants of Health (SDOH) Interventions    Readmission Risk Interventions No flowsheet data found.

## 2020-10-10 NOTE — Progress Notes (Addendum)
PROGRESS NOTE    Keith Carter  [id number] DOB: 1955/10/30 DOA: 10/04/2020 PCP: [name], NP   Chief complaint.  Anorexia. Brief Narrative:   Keith Carter is a 65 y.o. Caucasian male with medical history significant for hypertension, BPH,  dyslipidemia, and anxiety, TIA and DM2, who initially present to the emergency from 9/5 with weakness and falls.  While in the emergency room, patient was found to have hypoxemia, short of breath.  CT chest with contrast was obtained, showed a saddle PE and bilateral pulmonary emboli.  Patient was placed on heparin drip.  He he was seen by vascular surgery, pulmonary thrombectomy was performed on 9/8  Assessment & Plan:   Active Problems:   Type 2 diabetes mellitus with hyperlipidemia (HCC)   History of stroke   Hypertension   Acute massive pulmonary embolism (HCC)   Hypertensive emergency  Acute saddle PE. Right lower extremity DVT. Patient is status post a pulmonary thrombectomy. He does not have any hypoxemia, continue Eliquis.  Failure to thrive. Frequent falls. Generalized weakness. Anorexia with nausea. Patient still has significant weakness, he has very poor appetite, has not been eating yesterday and today.  But nausea has been better.  He had a bowel movement yesterday, which was small. I will start senna for constipation.  I will also continue Protonix twice a day.  Add megestrol for anorexia. Patient does not have overt GI bleed.  Uncontrolled type 2 diabetes with hyperglycemia  Continue sliding scale insulin.  Delirium. Patient has been requiring a sitter while in the hospital.  He is occasionally confused, not sure he has dementia.  I will start Seroquel every evening to improve his sleep pattern.  Hypertension emergency. Continue current treatment.   DVT prophylaxis: Eliquis Code Status: DNR Family Communication:  Disposition Plan:    Status is: Inpatient  Remains inpatient appropriate  because:Altered mental status and Inpatient level of care appropriate due to severity of illness  Dispo: The patient is from: Home              Anticipated d/c is to: SNF              Patient currently is not medically stable to d/c.   Difficult to place patient No        I/O last 3 completed shifts: In: 1567.1 [P.O.:1100; I.V.:3.2; IV Piggyback:463.9] Out: 1050 [Urine:1050] No intake/output data recorded.     Consultants:  Vascular  Procedures: Pulmonary thrombectomy.  Antimicrobials: None  Subjective: Patient still very weak, has very poor appetite, spoke with nurse, he has not been eating since yesterday. He does not have any short of breath or hypoxia.  No chest pain palpitation. No fever or chills. No dysuria hematuria No headache or dizziness.  Objective: Vitals:   10/09/20 1500 10/09/20 2124 10/10/20 0626 10/10/20 0740  BP: 122/77 127/72 (!) 142/79 (!) 159/82  Pulse: 69 76 (!) 52 (!) 58  Resp: '17 17 18 17  '$ Temp: 98.1 F (36.7 C) 98.3 F (36.8 C) 97.8 F (36.6 C) 98.5 F (36.9 C)  TempSrc: Oral Oral Oral   SpO2: 94% 96% 98% 96%  Weight:      Height:        Intake/Output Summary (Last 24 hours) at 10/10/2020 1050 Last data filed at 10/09/2020 2200 Gross per 24 hour  Intake 707.06 ml  Output 550 ml  Net 157.06 ml   Filed Weights   10/04/20 2321 10/08/20 0749 10/08/20 1315  Weight: 117.9 kg 117.9  kg 117 kg    Examination:  General exam: Appears calm and comfortable  Respiratory system: Clear to auscultation. Respiratory effort normal. Cardiovascular system: S1 & S2 heard, RRR. No JVD, murmurs, rubs, gallops or clicks. No pedal edema. Gastrointestinal system: Abdomen is nondistended, soft and nontender. Right abdominal hernia noted. Normal bowel sounds heard. Central nervous system: Drowsy and oriented x3. No focal neurological deficits. Extremities: Symmetric 5 x 5 power. Skin: No rashes, lesions or ulcers Psychiatry: Flat affect.    Data  Reviewed: I have personally reviewed following labs and imaging studies  CBC: Recent Labs  Lab 10/05/20 0011 10/06/20 1012 10/07/20 2041 10/08/20 1344 10/08/20 2029 10/09/20 0739  WBC 9.4 10.6* 11.7*  --   --  9.9  NEUTROABS 7.4 9.5*  --   --   --   --   HGB 14.4 15.0 15.1 13.6 14.2 14.3  HCT 42.5 44.0 44.4 39.9 41.5 43.0  MCV 89.3 88.2 87.6  --   --  89.0  PLT 206 206 200  --   --  [id number]   Basic Metabolic Panel: Recent Labs  Lab 10/06/20 1012 10/07/20 2041 10/08/20 1344 10/09/20 0739 10/10/20 0558  NA 139 136 135 135 135  K 3.5 3.7 3.6 4.5 3.5  CL 101 97* 100 101 104  CO2 '27 29 28 23 24  '$ GLUCOSE 255* 193* 179* 178* 135*  BUN '14 18 18 19 16  '$ CREATININE 1.03 0.90 1.01 1.06 0.81  CALCIUM 9.0 8.9 8.4* 8.8* 8.4*  MG  --   --   --  1.8 1.7   GFR: Estimated Creatinine Clearance: 118.5 mL/min (by C-G formula based on SCr of 0.81 mg/dL). Liver Function Tests: Recent Labs  Lab 10/05/20 0011 10/06/20 1012  AST 16 15  ALT 16 14  ALKPHOS 86 81  BILITOT 1.4* 1.6*  PROT 6.5 6.7  ALBUMIN 3.4* 3.4*   No results for input(s): LIPASE, AMYLASE in the last 168 hours. No results for input(s): AMMONIA in the last 168 hours. Coagulation Profile: Recent Labs  Lab 10/07/20 2041  INR 1.1   Cardiac Enzymes: No results for input(s): CKTOTAL, CKMB, CKMBINDEX, TROPONINI in the last 168 hours. BNP (last 3 results) No results for input(s): PROBNP in the last 8760 hours. HbA1C: Recent Labs    10/07/20 2041  HGBA1C 9.0*   CBG: Recent Labs  Lab 10/09/20 0753 10/09/20 1201 10/09/20 1515 10/09/20 2116 10/10/20 0727  GLUCAP 189* 194* 158* 140* 145*   Lipid Profile: No results for input(s): CHOL, HDL, LDLCALC, TRIG, CHOLHDL, LDLDIRECT in the last 72 hours. Thyroid Function Tests: No results for input(s): TSH, T4TOTAL, FREET4, T3FREE, THYROIDAB in the last 72 hours. Anemia Panel: No results for input(s): VITAMINB12, FOLATE, FERRITIN, TIBC, IRON, RETICCTPCT in the last 72  hours. Sepsis Labs: Recent Labs  Lab 10/05/20 0011  LATICACIDVEN 1.6    Recent Results (from the past 240 hour(s))  Resp Panel by RT-PCR (Flu A&B, Covid) Nasopharyngeal Swab     Status: None   Collection Time: 10/01/20  4:08 AM   Specimen: Nasopharyngeal Swab; Nasopharyngeal(NP) swabs in vial transport medium  Result Value Ref Range Status   SARS Coronavirus 2 by RT PCR NEGATIVE NEGATIVE Final    Comment: (NOTE) SARS-CoV-2 target nucleic acids are NOT DETECTED.  The SARS-CoV-2 RNA is generally detectable in upper respiratory specimens during the acute phase of infection. The lowest concentration of SARS-CoV-2 viral copies this assay can detect is 138 copies/mL. A negative result does not preclude SARS-Cov-2  infection and should not be used as the sole basis for treatment or other patient management decisions. A negative result may occur with  improper specimen collection/handling, submission of specimen other than nasopharyngeal swab, presence of viral mutation(s) within the areas targeted by this assay, and inadequate number of viral copies(<138 copies/mL). A negative result must be combined with clinical observations, patient history, and epidemiological information. The expected result is Negative.  Fact Sheet for Patients:  EntrepreneurPulse.com.au  Fact Sheet for Healthcare Providers:  IncredibleEmployment.be  This test is no t yet approved or cleared by the [place] FDA and  has been authorized for detection and/or diagnosis of SARS-CoV-2 by FDA under an Emergency Use Authorization (EUA). This EUA will remain  in effect (meaning this test can be used) for the duration of the COVID-19 declaration under Section 564(b)(1) of the Act, 21 U.S.C.section 360bbb-3(b)(1), unless the authorization is terminated  or revoked sooner.       Influenza A by PCR NEGATIVE NEGATIVE Final   Influenza B by PCR NEGATIVE NEGATIVE Final     Comment: (NOTE) The Xpert Xpress SARS-CoV-2/FLU/RSV plus assay is intended as an aid in the diagnosis of influenza from Nasopharyngeal swab specimens and should not be used as a sole basis for treatment. Nasal washings and aspirates are unacceptable for Xpert Xpress SARS-CoV-2/FLU/RSV testing.  Fact Sheet for Patients: EntrepreneurPulse.com.au  Fact Sheet for Healthcare Providers: IncredibleEmployment.be  This test is not yet approved or cleared by the [place] FDA and has been authorized for detection and/or diagnosis of SARS-CoV-2 by FDA under an Emergency Use Authorization (EUA). This EUA will remain in effect (meaning this test can be used) for the duration of the COVID-19 declaration under Section 564(b)(1) of the Act, 21 U.S.C. section 360bbb-3(b)(1), unless the authorization is terminated or revoked.  Performed at [facility], [address] [place], [place] [address]   Resp Panel by RT-PCR (Flu A&B, Covid) Nasopharyngeal Swab     Status: None   Collection Time: 10/04/20 11:31 PM   Specimen: Nasopharyngeal Swab; Nasopharyngeal(NP) swabs in vial transport medium  Result Value Ref Range Status   SARS Coronavirus 2 by RT PCR NEGATIVE NEGATIVE Final    Comment: (NOTE) SARS-CoV-2 target nucleic acids are NOT DETECTED.  The SARS-CoV-2 RNA is generally detectable in upper respiratory specimens during the acute phase of infection. The lowest concentration of SARS-CoV-2 viral copies this assay can detect is 138 copies/mL. A negative result does not preclude SARS-Cov-2 infection and should not be used as the sole basis for treatment or other patient management decisions. A negative result may occur with  improper specimen collection/handling, submission of specimen other than nasopharyngeal swab, presence of viral mutation(s) within the areas targeted by this assay, and inadequate number of viral copies(<138 copies/mL). A  negative result must be combined with clinical observations, patient history, and epidemiological information. The expected result is Negative.  Fact Sheet for Patients:  EntrepreneurPulse.com.au  Fact Sheet for Healthcare Providers:  IncredibleEmployment.be  This test is no t yet approved or cleared by the [place] FDA and  has been authorized for detection and/or diagnosis of SARS-CoV-2 by FDA under an Emergency Use Authorization (EUA). This EUA will remain  in effect (meaning this test can be used) for the duration of the COVID-19 declaration under Section 564(b)(1) of the Act, 21 U.S.C.section 360bbb-3(b)(1), unless the authorization is terminated  or revoked sooner.       Influenza A by PCR NEGATIVE NEGATIVE Final   Influenza B  by PCR NEGATIVE NEGATIVE Final    Comment: (NOTE) The Xpert Xpress SARS-CoV-2/FLU/RSV plus assay is intended as an aid in the diagnosis of influenza from Nasopharyngeal swab specimens and should not be used as a sole basis for treatment. Nasal washings and aspirates are unacceptable for Xpert Xpress SARS-CoV-2/FLU/RSV testing.  Fact Sheet for Patients: EntrepreneurPulse.com.au  Fact Sheet for Healthcare Providers: IncredibleEmployment.be  This test is not yet approved or cleared by the [place] FDA and has been authorized for detection and/or diagnosis of SARS-CoV-2 by FDA under an Emergency Use Authorization (EUA). This EUA will remain in effect (meaning this test can be used) for the duration of the COVID-19 declaration under Section 564(b)(1) of the Act, 21 U.S.C. section 360bbb-3(b)(1), unless the authorization is terminated or revoked.  Performed at [facility], [place], [place] [address]   SARS CORONAVIRUS 2 (TAT 6-24 HRS) Nasopharyngeal Nasopharyngeal Swab     Status: None   Collection Time: 10/07/20 10:06 PM   Specimen:  Nasopharyngeal Swab  Result Value Ref Range Status   SARS Coronavirus 2 NEGATIVE NEGATIVE Final    Comment: (NOTE) SARS-CoV-2 target nucleic acids are NOT DETECTED.  The SARS-CoV-2 RNA is generally detectable in upper and lower respiratory specimens during the acute phase of infection. Negative results do not preclude SARS-CoV-2 infection, do not rule out co-infections with other pathogens, and should not be used as the sole basis for treatment or other patient management decisions. Negative results must be combined with clinical observations, patient history, and epidemiological information. The expected result is Negative.  Fact Sheet for Patients: SugarRoll.be  Fact Sheet for Healthcare Providers: https://www.woods-mathews.com/  This test is not yet approved or cleared by the [place] FDA and  has been authorized for detection and/or diagnosis of SARS-CoV-2 by FDA under an Emergency Use Authorization (EUA). This EUA will remain  in effect (meaning this test can be used) for the duration of the COVID-19 declaration under Se ction 564(b)(1) of the Act, 21 U.S.C. section 360bbb-3(b)(1), unless the authorization is terminated or revoked sooner.  Performed at [facility], [place] [address], [place], [place] [address]          Radiology Studies: ECHOCARDIOGRAM COMPLETE  Result Date: 10/09/2020    ECHOCARDIOGRAM REPORT   Patient Name:   Seldon JAY Marcoe Date of Exam: 10/09/2020 Medical Rec #:  [id number]      Height:       73.0 in Accession #:    [id number]     Weight:       257.9 lb Date of Birth:  March 28, 1955       BSA:          2.397 m Patient Age:    76 years       BP:           121/81 mmHg Patient Gender: M              HR:           72 bpm. Exam Location:  [facility] Procedure: 2D Echo, Color Doppler, Cardiac Doppler and Intracardiac            Opacification Agent Indications:     I26.09 Pulmonary Embolus  History:         Patient has  prior history of Echocardiogram examinations, most                  recent 09/28/2019. TIA; Risk Factors:Hypertension and  Dyslipidemia.  Sonographer:     Charmayne Sheer Referring Phys:  Burr Diagnosing Phys: Yolonda Kida MD  Sonographer Comments: Technically difficult study due to poor echo windows. Image acquisition challenging due to patient body habitus and Image acquisition challenging due to respiratory motion. IMPRESSIONS  1. Left ventricular ejection fraction, by estimation, is 40 to 45%. The left ventricle has mildly decreased function. The left ventricle demonstrates global hypokinesis. There is mild left ventricular hypertrophy. Left ventricular diastolic parameters were normal.  2. Right ventricular systolic function is normal. The right ventricular size is normal.  3. The mitral valve is normal in structure. No evidence of mitral valve regurgitation.  4. The aortic valve is normal in structure. Aortic valve regurgitation is not visualized. FINDINGS  Left Ventricle: Left ventricular ejection fraction, by estimation, is 40 to 45%. The left ventricle has mildly decreased function. The left ventricle demonstrates global hypokinesis. Definity contrast agent was given IV to delineate the left ventricular  endocardial borders. The left ventricular internal cavity size was normal in size. There is mild left ventricular hypertrophy. Left ventricular diastolic parameters were normal. Right Ventricle: The right ventricular size is normal. No increase in right ventricular wall thickness. Right ventricular systolic function is normal. Left Atrium: Left atrial size was normal in size. Right Atrium: Right atrial size was normal in size. Pericardium: The pericardium was not well visualized. Mitral Valve: The mitral valve is normal in structure. No evidence of mitral valve regurgitation. MV peak gradient, 2.6 mmHg. The mean mitral valve gradient is 1.0 mmHg. Tricuspid Valve: The tricuspid  valve is normal in structure. Tricuspid valve regurgitation is trivial. Aortic Valve: The aortic valve is normal in structure. Aortic valve regurgitation is not visualized. Aortic valve mean gradient measures 4.0 mmHg. Aortic valve peak gradient measures 8.9 mmHg. Aortic valve area, by VTI measures 3.28 cm. Pulmonic Valve: The pulmonic valve was normal in structure. Pulmonic valve regurgitation is not visualized. Aorta: The ascending aorta was not well visualized. IAS/Shunts: No atrial level shunt detected by color flow Doppler.  LEFT VENTRICLE PLAX 2D LVIDd:         4.30 cm  Diastology LVIDs:         3.70 cm  LV e' medial:    4.79 cm/s LV PW:         1.20 cm  LV E/e' medial:  14.2 LV IVS:        1.10 cm  LV e' lateral:   11.30 cm/s LVOT diam:     2.80 cm  LV E/e' lateral: 6.0 LV SV:         79 LV SV Index:   33 LVOT Area:     6.16 cm  LEFT ATRIUM           Index LA diam:      3.80 cm 1.59 cm/m LA Vol (A4C): 60.5 ml 25.24 ml/m  AORTIC VALVE                   PULMONIC VALVE AV Area (Vmax):    3.05 cm    PV Vmax:       0.94 m/s AV Area (Vmean):   3.24 cm    PV Vmean:      60.900 cm/s AV Area (VTI):     3.28 cm    PV VTI:        0.168 m AV Vmax:           149.00 cm/s PV Peak  grad:  3.5 mmHg AV Vmean:          95.000 cm/s PV Mean grad:  2.0 mmHg AV VTI:            0.240 m AV Peak Grad:      8.9 mmHg AV Mean Grad:      4.0 mmHg LVOT Vmax:         73.80 cm/s LVOT Vmean:        50.000 cm/s LVOT VTI:          0.128 m LVOT/AV VTI ratio: 0.53  AORTA Ao Root diam: 3.50 cm MITRAL VALVE MV Area (PHT): 2.56 cm    SHUNTS MV Area VTI:   3.02 cm    Systemic VTI:  0.13 m MV Peak grad:  2.6 mmHg    Systemic Diam: 2.80 cm MV Mean grad:  1.0 mmHg MV Vmax:       0.81 m/s MV Vmean:      50.2 cm/s MV Decel Time: 296 msec MV E velocity: 68.10 cm/s MV A velocity: 67.70 cm/s MV E/A ratio:  1.01 Dwayne D Callwood MD Electronically signed by Yolonda Kida MD Signature Date/Time: 10/09/2020/2:07:20 PM    Final         Scheduled  Meds:  amLODipine  5 mg Oral Daily   apixaban  10 mg Oral BID   Followed by   Derrill Memo ON 10/16/2020] apixaban  5 mg Oral BID   atorvastatin  40 mg Oral q1800   cholecalciferol  5,000 Units Oral Daily   clopidogrel  75 mg Oral Daily   insulin aspart  0-9 Units Subcutaneous TID AC & HS   lisinopril  40 mg Oral QHS   megestrol  400 mg Oral Daily   pantoprazole  40 mg Oral BID   PARoxetine  40 mg Oral QHS   senna-docusate  2 tablet Oral BID   tamsulosin  0.8 mg Oral QHS   Continuous Infusions:  promethazine (PHENERGAN) injection (IM or IVPB) 12.5 mg (10/08/20 2234)     LOS: 3 days    Time spent: 32 minutes    Sharen Hones, MD [facility]   To contact the attending provider between 7A-7P or the covering provider during after hours 7P-7A, please log into the web site [email or web address] and access using universal [place] password for that web site. If you do not have the password, please call the hospital operator.  10/10/2020, 10:50 AM

## 2020-10-11 ENCOUNTER — Inpatient Hospital Stay: Payer: Medicare HMO

## 2020-10-11 DIAGNOSIS — Z8673 Personal history of transient ischemic attack (TIA), and cerebral infarction without residual deficits: Secondary | ICD-10-CM | POA: Diagnosis not present

## 2020-10-11 DIAGNOSIS — R627 Adult failure to thrive: Secondary | ICD-10-CM

## 2020-10-11 DIAGNOSIS — I2692 Saddle embolus of pulmonary artery without acute cor pulmonale: Secondary | ICD-10-CM | POA: Diagnosis not present

## 2020-10-11 DIAGNOSIS — E785 Hyperlipidemia, unspecified: Secondary | ICD-10-CM | POA: Diagnosis not present

## 2020-10-11 DIAGNOSIS — F05 Delirium due to known physiological condition: Secondary | ICD-10-CM

## 2020-10-11 DIAGNOSIS — E1169 Type 2 diabetes mellitus with other specified complication: Secondary | ICD-10-CM | POA: Diagnosis not present

## 2020-10-11 DIAGNOSIS — I161 Hypertensive emergency: Secondary | ICD-10-CM | POA: Diagnosis not present

## 2020-10-11 DIAGNOSIS — I10 Essential (primary) hypertension: Secondary | ICD-10-CM | POA: Diagnosis not present

## 2020-10-11 DIAGNOSIS — I2699 Other pulmonary embolism without acute cor pulmonale: Secondary | ICD-10-CM | POA: Diagnosis not present

## 2020-10-11 LAB — GLUCOSE, CAPILLARY
Glucose-Capillary: 114 mg/dL — ABNORMAL HIGH (ref 70–99)
Glucose-Capillary: 111 mg/dL — ABNORMAL HIGH (ref 70–99)
Glucose-Capillary: 174 mg/dL — ABNORMAL HIGH (ref 70–99)
Glucose-Capillary: 152 mg/dL — ABNORMAL HIGH (ref 70–99)

## 2020-10-11 LAB — BASIC METABOLIC PANEL
Anion gap: 6 (ref 5–15)
BUN: 20 mg/dL (ref 8–23)
CO2: 29 mmol/L (ref 22–32)
Calcium: 8.6 mg/dL — ABNORMAL LOW (ref 8.9–10.3)
Chloride: 103 mmol/L (ref 98–111)
Creatinine, Ser: 0.95 mg/dL (ref 0.61–1.24)
GFR, Estimated: >60 mL/min (ref >60)
Glucose, Bld: 113 mg/dL — ABNORMAL HIGH (ref 70–99)
Potassium: 3.5 mmol/L (ref 3.5–5.1)
Sodium: 138 mmol/L (ref 135–145)

## 2020-10-11 LAB — MAGNESIUM: Magnesium: 1.8 mg/dL (ref 1.7–2.4)

## 2020-10-11 MED ORDER — LACTULOSE 10 GM/15ML PO SOLN
20.0000 g | Freq: Once | ORAL | Status: AC
  Administered 2020-10-11: 20 g via ORAL
  Filled 2020-10-11: qty 30

## 2020-10-11 NOTE — Progress Notes (Signed)
PT Cancellation Note  Patient Details Name: Keith Carter MRN: [id number] DOB: 02-07-1955   Cancelled Treatment:    Reason Eval/Treat Not Completed: Other (comment). Per chart code stroke called this AM. RN stated pt returned to room, resting. Transport at bedside prepping to take patient to MRI. PT to follow up as able/as patient is medically appropriate.  Lieutenant Diego PT, DPT 10:56 AM,10/11/20

## 2020-10-11 NOTE — Progress Notes (Addendum)
PROGRESS NOTE    Keith Carter  [id number] DOB: [birth year] DOA: 10/04/2020 PCP: [name], NP   Chief complaint.  Nausea vomiting Brief Narrative:  Keith Carter is a 65 y.o. Caucasian male with medical history significant for hypertension, BPH,  dyslipidemia, and anxiety, TIA and DM2, who initially present to the emergency from 9/5 with weakness and falls.  While in the emergency room, patient was found to have hypoxemia, short of breath.  CT chest with contrast was obtained, showed a saddle PE and bilateral pulmonary emboli.  Patient was placed on heparin drip.  He he was seen by vascular surgery, pulmonary thrombectomy was performed on 9/8 Palliative care consulted for failure to thrive.  Assessment & Plan:   Active Problems:   Type 2 diabetes mellitus with hyperlipidemia (HCC)   History of stroke   Hypertension   Acute massive pulmonary embolism (HCC)   Hypertensive emergency    Acute saddle PE. Right lower extremity DVT. Patient had a pulmonary thrombectomy by vascular surgery. Will continue Eliquis.  Failure to thrive. Frequent falls. Generalized weakness. Anorexia with nausea. Dementia with delirium. Patient has been deteriorating before admission to the hospital, he has very poor appetite, is sleeping most of time in the daytime. He had a slurred speech today, seen by neurology, MRI showed extensive small vessel disease, patient probably has significant dementia.  Patient has requiring sitter in the room due to agitation.  I placed him on Seroquel at nighttime to improve his sleep pattern. Patient still has no bowel movement after giving stool softener, will give more dose of lactulose.  Continue senna He is placed on Protonix, megestrol, appetite is not improving. At this point, I will obtain palliative care consult. Patient long-term prognosis is poor.  Uncontrolled type 2 diabetes with hyperglycemia  Continue sliding scale insulin.  Hypertensive  emergency  Continue current treatment.  Ventral hernia. Patient has a ventral hernia on exam, is wide-based, unlikely to cause any bowel obstruction.  However, due to patient anorexia, I will obtain a CT scan to evaluate.   DVT prophylaxis: Eliquis Code Status: DNR Family Communication:  Disposition Plan:    Status is: Inpatient  Remains inpatient appropriate because:Altered mental status, Unsafe d/c plan, and Inpatient level of care appropriate due to severity of illness  Dispo: The patient is from: Home              Anticipated d/c is to: SNF              Patient currently is medically stable to d/c.   Difficult to place patient No        I/O last 3 completed shifts: In: 600 [P.O.:600] Out: 1525 [Urine:1525] No intake/output data recorded.     Consultants:  Neurology  Procedures: None  Antimicrobials: None  Subjective: Patient had a significant slurred speech this morning, code stroke was called, MRI ruled out stroke. Patient still has very poor appetite, still no bowel movement after giving laxatives.  Not complaining of abdominal pain.  No nausea vomiting. Denies any short of breath, currently on 2 L oxygen. No fever or chills. No dysuria hematuria.  Objective: Vitals:   10/11/20 0023 10/11/20 0436 10/11/20 0740 10/11/20 1152  BP: 114/72 126/86 121/85 138/90  Pulse: 83 70 68 75  Resp: '18 18 20 20  '$ Temp: 98.8 F (37.1 C) (!) 97.5 F (36.4 C) 97.9 F (36.6 C) (!) 97.5 F (36.4 C)  TempSrc:    Oral  SpO2: 98% 98%  99% 99%  Weight:      Height:        Intake/Output Summary (Last 24 hours) at 10/11/2020 1233 Last data filed at 10/11/2020 0050 Gross per 24 hour  Intake 360 ml  Output 925 ml  Net -565 ml   Filed Weights   10/04/20 2321 10/08/20 0749 10/08/20 1315  Weight: 117.9 kg 117.9 kg 117 kg    Examination:  General exam: Appears calm and comfortable  Respiratory system: Clear to auscultation. Respiratory effort normal. Cardiovascular  system: S1 & S2 heard, RRR. No JVD, murmurs, rubs, gallops or clicks. No pedal edema. Gastrointestinal system: Abdomen is nondistended, soft and nontender. No organomegaly or masses felt. Normal bowel sounds heard. Central nervous system: Drowsy and oriented x2. No focal neurological deficits. Extremities: Symmetric 5 x 5 power. Skin: No rashes, lesions or ulcers Psychiatry: Mood & affect appropriate.     Data Reviewed: I have personally reviewed following labs and imaging studies  CBC: Recent Labs  Lab 10/05/20 0011 10/06/20 1012 10/07/20 2041 10/08/20 1344 10/08/20 2029 10/09/20 0739  WBC 9.4 10.6* 11.7*  --   --  9.9  NEUTROABS 7.4 9.5*  --   --   --   --   HGB 14.4 15.0 15.1 13.6 14.2 14.3  HCT 42.5 44.0 44.4 39.9 41.5 43.0  MCV 89.3 88.2 87.6  --   --  89.0  PLT 206 206 200  --   --  [id number]   Basic Metabolic Panel: Recent Labs  Lab 10/07/20 2041 10/08/20 1344 10/09/20 0739 10/10/20 0558 10/11/20 0428  NA 136 135 135 135 138  K 3.7 3.6 4.5 3.5 3.5  CL 97* 100 101 104 103  CO2 '29 28 23 24 29  '$ GLUCOSE 193* 179* 178* 135* 113*  BUN '18 18 19 16 20  '$ CREATININE 0.90 1.01 1.06 0.81 0.95  CALCIUM 8.9 8.4* 8.8* 8.4* 8.6*  MG  --   --  1.8 1.7 1.8   GFR: Estimated Creatinine Clearance: 101.1 mL/min (by C-G formula based on SCr of 0.95 mg/dL). Liver Function Tests: Recent Labs  Lab 10/05/20 0011 10/06/20 1012  AST 16 15  ALT 16 14  ALKPHOS 86 81  BILITOT 1.4* 1.6*  PROT 6.5 6.7  ALBUMIN 3.4* 3.4*   No results for input(s): LIPASE, AMYLASE in the last 168 hours. No results for input(s): AMMONIA in the last 168 hours. Coagulation Profile: Recent Labs  Lab 10/07/20 2041  INR 1.1   Cardiac Enzymes: No results for input(s): CKTOTAL, CKMB, CKMBINDEX, TROPONINI in the last 168 hours. BNP (last 3 results) No results for input(s): PROBNP in the last 8760 hours. HbA1C: No results for input(s): HGBA1C in the last 72 hours. CBG: Recent Labs  Lab 10/10/20 1115  10/10/20 1613 10/10/20 2231 10/11/20 0901 10/11/20 1155  GLUCAP 128* 170* 125* 114* 111*   Lipid Profile: No results for input(s): CHOL, HDL, LDLCALC, TRIG, CHOLHDL, LDLDIRECT in the last 72 hours. Thyroid Function Tests: No results for input(s): TSH, T4TOTAL, FREET4, T3FREE, THYROIDAB in the last 72 hours. Anemia Panel: No results for input(s): VITAMINB12, FOLATE, FERRITIN, TIBC, IRON, RETICCTPCT in the last 72 hours. Sepsis Labs: Recent Labs  Lab 10/05/20 0011  LATICACIDVEN 1.6    Recent Results (from the past 240 hour(s))  Resp Panel by RT-PCR (Flu A&B, Covid) Nasopharyngeal Swab     Status: None   Collection Time: 10/04/20 11:31 PM   Specimen: Nasopharyngeal Swab; Nasopharyngeal(NP) swabs in vial transport medium  Result Value Ref  Range Status   SARS Coronavirus 2 by RT PCR NEGATIVE NEGATIVE Final    Comment: (NOTE) SARS-CoV-2 target nucleic acids are NOT DETECTED.  The SARS-CoV-2 RNA is generally detectable in upper respiratory specimens during the acute phase of infection. The lowest concentration of SARS-CoV-2 viral copies this assay can detect is 138 copies/mL. A negative result does not preclude SARS-Cov-2 infection and should not be used as the sole basis for treatment or other patient management decisions. A negative result may occur with  improper specimen collection/handling, submission of specimen other than nasopharyngeal swab, presence of viral mutation(s) within the areas targeted by this assay, and inadequate number of viral copies(<138 copies/mL). A negative result must be combined with clinical observations, patient history, and epidemiological information. The expected result is Negative.  Fact Sheet for Patients:  EntrepreneurPulse.com.au  Fact Sheet for Healthcare Providers:  IncredibleEmployment.be  This test is no t yet approved or cleared by the [place] FDA and  has been authorized for detection and/or  diagnosis of SARS-CoV-2 by FDA under an Emergency Use Authorization (EUA). This EUA will remain  in effect (meaning this test can be used) for the duration of the COVID-19 declaration under Section 564(b)(1) of the Act, 21 U.S.C.section 360bbb-3(b)(1), unless the authorization is terminated  or revoked sooner.       Influenza A by PCR NEGATIVE NEGATIVE Final   Influenza B by PCR NEGATIVE NEGATIVE Final    Comment: (NOTE) The Xpert Xpress SARS-CoV-2/FLU/RSV plus assay is intended as an aid in the diagnosis of influenza from Nasopharyngeal swab specimens and should not be used as a sole basis for treatment. Nasal washings and aspirates are unacceptable for Xpert Xpress SARS-CoV-2/FLU/RSV testing.  Fact Sheet for Patients: EntrepreneurPulse.com.au  Fact Sheet for Healthcare Providers: IncredibleEmployment.be  This test is not yet approved or cleared by the [place] FDA and has been authorized for detection and/or diagnosis of SARS-CoV-2 by FDA under an Emergency Use Authorization (EUA). This EUA will remain in effect (meaning this test can be used) for the duration of the COVID-19 declaration under Section 564(b)(1) of the Act, 21 U.S.C. section 360bbb-3(b)(1), unless the authorization is terminated or revoked.  Performed at [facility], [place], [place] [address]   SARS CORONAVIRUS 2 (TAT 6-24 HRS) Nasopharyngeal Nasopharyngeal Swab     Status: None   Collection Time: 10/07/20 10:06 PM   Specimen: Nasopharyngeal Swab  Result Value Ref Range Status   SARS Coronavirus 2 NEGATIVE NEGATIVE Final    Comment: (NOTE) SARS-CoV-2 target nucleic acids are NOT DETECTED.  The SARS-CoV-2 RNA is generally detectable in upper and lower respiratory specimens during the acute phase of infection. Negative results do not preclude SARS-CoV-2 infection, do not rule out co-infections with other pathogens, and should not be used  as the sole basis for treatment or other patient management decisions. Negative results must be combined with clinical observations, patient history, and epidemiological information. The expected result is Negative.  Fact Sheet for Patients: SugarRoll.be  Fact Sheet for Healthcare Providers: https://www.woods-mathews.com/  This test is not yet approved or cleared by the [place] FDA and  has been authorized for detection and/or diagnosis of SARS-CoV-2 by FDA under an Emergency Use Authorization (EUA). This EUA will remain  in effect (meaning this test can be used) for the duration of the COVID-19 declaration under Se ction 564(b)(1) of the Act, 21 U.S.C. section 360bbb-3(b)(1), unless the authorization is terminated or revoked sooner.  Performed at [facility], [address], [place], [place] [address]          Radiology Studies: CT HEAD WO CONTRAST (5MM)  Result Date: 10/11/2020 CLINICAL DATA:  65 year old male code stroke presentation with slurred speech and facial droop. EXAM: CT HEAD WITHOUT CONTRAST TECHNIQUE: Contiguous axial images were obtained from the base of the skull through the vertex without intravenous contrast. COMPARISON:  Recent brain MRI 10/01/2020. Head CT 10/05/2020 and earlier. FINDINGS: Brain: No midline shift, mass effect, or evidence of intracranial mass lesion. No ventriculomegaly. No acute intracranial hemorrhage identified. Advanced, confluent bilateral cerebral white matter hypodensity appears stable from the recent exams. Chronic bilateral thalamic lacunar infarcts. Stable heterogeneity in the bilateral basal ganglia. No cortically based acute infarct identified.  ASPECTS 10. Vascular: Calcified atherosclerosis at the skull base. Tortuous intracranial arteries. No suspicious intracranial vascular hyperdensity. Skull: No acute osseous abnormality identified. Sinuses/Orbits: Visualized paranasal sinuses  and mastoids are stable and well aerated. Other: No acute orbit or scalp soft tissue finding. IMPRESSION: 1. Continued stable CT appearance of advanced small vessel disease. No hemorrhage. ASPECTS 10. 2. These results were communicated to Dr. Curly Shores at 9:33 am on 10/11/2020 by text page via the [facility] messaging system. Electronically Signed   By: Genevie Ann M.D.   On: 10/11/2020 09:33   MR BRAIN WO CONTRAST  Result Date: 10/11/2020 CLINICAL DATA:  65 year old male  male code stroke presentation with slurred speech and facial droop. EXAM: MRI HEAD WITHOUT CONTRAST TECHNIQUE: Multiplanar, multiecho pulse sequences of the brain and surrounding structures were obtained without intravenous contrast. COMPARISON:  Plain head CT 0911 hours today.  Brain MRI 10/01/2020. FINDINGS: Brain: No restricted diffusion to suggest acute infarction. No midline shift, mass effect, evidence of mass lesion, ventriculomegaly, extra-axial collection or acute intracranial hemorrhage. Cervicomedullary junction and pituitary are within normal limits. Chronic lacunar infarcts in the bilateral corona radiata, deep gray matter nuclei. Additional Patchy and confluent bilateral cerebral white matter T2 and FLAIR hyperintensity. Numerous chronic microhemorrhages in the deep gray matter nuclei, with fewer scattered in the bilateral cerebral hemispheres. The brainstem and cerebellum are relatively spared. The signal changes appear stable from earlier this month. No cortical encephalomalacia identified. Vascular: Major intracranial vascular flow voids are stable. Intracranial artery tortuosity. Skull and upper cervical spine: Chronic ligamentous degeneration about the odontoid. Visualized bone marrow signal is within normal limits. Sinuses/Orbits: Stable, negative. Other: Visible internal auditory structures appear normal. Negative visible scalp and face. IMPRESSION: 1. No acute intracranial abnormality. 2. Stable MRI appearance of the brain from  September 1st with advanced chronic small vessel disease including numerous chronic micro-hemorrhages in the bilateral deep gray nuclei. Electronically Signed   By: Genevie Ann M.D.   On: 10/11/2020 11:54   ECHOCARDIOGRAM COMPLETE  Result Date: 10/09/2020    ECHOCARDIOGRAM REPORT   Patient Name:   DENNON MOYNAHAN Engert Date of Exam: 10/09/2020 Medical Rec #:  [id number]      Height:       73.0 in Accession #:    [id number]     Weight:       257.9 lb Date of Birth:  [birth year]       BSA:          2.397 m Patient Age:    45 years       BP:           121/81 mmHg Patient Gender: M              HR:  72 bpm. Exam Location:  [facility] Procedure: 2D Echo, Color Doppler, Cardiac Doppler and Intracardiac            Opacification Agent Indications:     I26.09 Pulmonary Embolus  History:         Patient has prior history of Echocardiogram examinations, most                  recent 09/28/2019. TIA; Risk Factors:Hypertension and                  Dyslipidemia.  Sonographer:     Charmayne Sheer Referring Phys:  [place]den Diagnosing Phys: Yolonda Kida MD  Sonographer Comments: Technically difficult study due to poor echo windows. Image acquisition challenging due to patient body habitus and Image acquisition challenging due to respiratory motion. IMPRESSIONS  1. Left ventricular ejection fraction, by estimation, is 40 to 45%. The left ventricle has mildly decreased function. The left ventricle demonstrates global hypokinesis. There is mild left ventricular hypertrophy. Left ventricular diastolic parameters were normal.  2. Right ventricular systolic function is normal. The right ventricular size is normal.  3. The mitral valve is normal in structure. No evidence of mitral valve regurgitation.  4. The aortic valve is normal in structure. Aortic valve regurgitation is not visualized. FINDINGS  Left Ventricle: Left ventricular ejection fraction, by estimation, is 40 to 45%. The left ventricle has mildly decreased function. The left  ventricle demonstrates global hypokinesis. Definity contrast agent was given IV to delineate the left ventricular  endocardial borders. The left ventricular internal cavity size was normal in size. There is mild left ventricular hypertrophy. Left ventricular diastolic parameters were normal. Right Ventricle: The right ventricular size is normal. No increase in right ventricular wall thickness. Right ventricular systolic function is normal. Left Atrium: Left atrial size was normal in size. Right Atrium: Right atrial size was normal in size. Pericardium: The pericardium was not well visualized. Mitral Valve: The mitral valve is normal in structure. No evidence of mitral valve regurgitation. MV peak gradient, 2.6 mmHg. The mean mitral valve gradient is 1.0 mmHg. Tricuspid Valve: The tricuspid valve is normal in structure. Tricuspid valve regurgitation is trivial. Aortic Valve: The aortic valve is normal in structure. Aortic valve regurgitation is not visualized. Aortic valve mean gradient measures 4.0 mmHg. Aortic valve peak gradient measures 8.9 mmHg. Aortic valve area, by VTI measures 3.28 cm. Pulmonic Valve: The pulmonic valve was normal in structure. Pulmonic valve regurgitation is not visualized. Aorta: The ascending aorta was not well visualized. IAS/Shunts: No atrial level shunt detected by color flow Doppler.  LEFT VENTRICLE PLAX 2D LVIDd:         4.30 cm  Diastology LVIDs:         3.70 cm  LV e' medial:    4.79 cm/s LV PW:         1.20 cm  LV E/e' medial:  14.2 LV IVS:        1.10 cm  LV e' lateral:   11.30 cm/s LVOT diam:     2.80 cm  LV E/e' lateral: 6.0 LV SV:         79 LV SV Index:   33 LVOT Area:     6.16 cm  LEFT ATRIUM           Index LA diam:      3.80 cm 1.59 cm/m LA Vol (A4C): 60.5 ml 25.24 ml/m  AORTIC VALVE  PULMONIC VALVE AV Area (Vmax):    3.05 cm    PV Vmax:       0.94 m/s AV Area (Vmean):   3.24 cm    PV Vmean:      60.900 cm/s AV Area (VTI):     3.28 cm    PV VTI:         0.168 m AV Vmax:           149.00 cm/s PV Peak grad:  3.5 mmHg AV Vmean:          95.000 cm/s PV Mean grad:  2.0 mmHg AV VTI:            0.240 m AV Peak Grad:      8.9 mmHg AV Mean Grad:      4.0 mmHg LVOT Vmax:         73.80 cm/s LVOT Vmean:        50.000 cm/s LVOT VTI:          0.128 m LVOT/AV VTI ratio: 0.53  AORTA Ao Root diam: 3.50 cm MITRAL VALVE MV Area (PHT): 2.56 cm    SHUNTS MV Area VTI:   3.02 cm    Systemic VTI:  0.13 m MV Peak grad:  2.6 mmHg    Systemic Diam: 2.80 cm MV Mean grad:  1.0 mmHg MV Vmax:       0.81 m/s MV Vmean:      50.2 cm/s MV Decel Time: 296 msec MV E velocity: 68.10 cm/s MV A velocity: 67.70 cm/s MV E/A ratio:  1.01 Dwayne D Callwood MD Electronically signed by Yolonda Kida MD Signature Date/Time: 10/09/2020/2:07:20 PM    Final         Scheduled Meds:  amLODipine  5 mg Oral Daily   apixaban  10 mg Oral BID   Followed by   Derrill Memo ON 10/16/2020] apixaban  5 mg Oral BID   atorvastatin  40 mg Oral q1800   cholecalciferol  5,000 Units Oral Daily   clopidogrel  75 mg Oral Daily   insulin aspart  0-9 Units Subcutaneous TID AC & HS   lisinopril  40 mg Oral QHS   megestrol  400 mg Oral Daily   pantoprazole  40 mg Oral BID   PARoxetine  40 mg Oral QHS   senna-docusate  2 tablet Oral BID   tamsulosin  0.8 mg Oral QHS   Continuous Infusions:  promethazine (PHENERGAN) injection (IM or IVPB) 12.5 mg (10/08/20 2234)     LOS: 4 days    Time spent: 32 minutes    Sharen Hones, MD [facility]   To contact the attending provider between 7A-7P or the covering provider during after hours 7P-7A, please log into the web site [email or web address] and access using universal [place] password for that web site. If you do not have the password, please call the hospital operator.  10/11/2020, 12:33 PM

## 2020-10-11 NOTE — Consult Note (Signed)
Neurology Consultation Reason for Consult: Code stroke for acute on chronic slurred speech and asymmetric left upper extremity hyperreflexia on primary team's evaluation Requesting Physician: Dr. Sharen Hones  CC: No complaint per patient  History is obtained from: Patient, chart review, bedside nurse and primary team  HPI: Keith Carter is a 65 y.o. 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  He presented on 9/1 with 2 days of generalized weakness with perhaps some worsened lower extremity weakness compared to the uppers and was noted to have some questionable word finding difficulties on the ED providers examination.  MRI brain was negative, as well as MRI lumbar spine.  He continued to have difficulty transferring from bed to chair and vice versa for which he had repeatedly called EMS, until presenting again on 9/4 at which time he continued to give variable history, though he oriented x4 except to the year with a nonfocal neurological examination.  He was admitted for observation and physical therapy/Occupational Therapy evaluation and then was observed to have some vomiting, significant desaturation, and eventually discovered to have submassive PE for which he underwent thrombectomy on 9/8, initially started on heparin drip and then transition to Eliquis.  Due to anorexia with nausea he was started on Megace on 9/10 and noted to be having intermittent delirium for which there was a plan to start Seroquel but this was not started.  He is ordered for as needed trazodone for sleep but this has not been used in couple of days.  Per nursing he did sleep well last night.  On later reexamination, he is sleepy but awakens and maintains attention when I gently wake him.   Patient is able to tell me that he was admitted due to falls and that he lives at home with his wife, and that he typically struggles remembering the year and still cannot remember what year it is.  He tells me that it is Monday and when I tell him it is the day before Monday he says "oh Tuesday then."  He is oriented to the fact that he is in [place].  He is unable to recall that he has been admitted for pulmonary embolus and has had an embolectomy.  Intermittently slurs some of his words but when I ask him questions again he is able to speak more clearly.  Regarding his stroke history and other prior neurological work-up, per chart review he was admitted on 04/05/2019 for stroke manifesting with dysarthria/aphasia since which time he has had intermittent memory deficits.  On reevaluation in August 2021 for acute onset encephalopathy (confusion, memory loss and aphasia felt to be out of proportion to his confusion) he was noted to have a baseline left facial droop, bilateral upper extremity tremor and paratonia as well as dysarthria. He had an EEG on 09/30/2019 which showed intermittent slowing generalized and maximal in the left temporal region, with overnight EEG from 8/36-8/31 which was within normal limits.  Subsequently he had an ambulatory EEG on 11/01/2019 for 72 hours which was negative.  He is followed by Dr. Melrose Nakayama on an outpatient basis  LKW: 9/10 AM per Dr. Roosevelt Locks, no substantial change from baseline per bedside nurse tPA given?: No, Eliquis last dose 9/11 AM and out of the window IA performed?: No, exam not consistent with LVO Premorbid modified rankin  scale:      2 - Slight disability. Able to look after own affairs without assistance, but unable to carry out all previous activities.   ROS: Unable to obtain due to altered mental status.   Past Medical History:  Diagnosis Date   Anxiety    Dyslipidemia    Enlarged prostate    Hypertension    Prediabetes    TIA (transient ischemic  attack)    Past Surgical History:  Procedure Laterality Date   BACK SURGERY     HERNIA REPAIR     PULMONARY THROMBECTOMY N/A 10/08/2020   Procedure: PULMONARY THROMBECTOMY;  Surgeon: Algernon Huxley, MD;  Location: [facility];  Service: Cardiovascular;  Laterality: N/A;   Current Outpatient Medications  Medication Instructions   acetaminophen (TYLENOL) 500 mg, Oral, Every 6 hours PRN   amLODipine (NORVASC) 5 mg, Oral, Daily   atorvastatin (LIPITOR) 40 mg, Oral, Daily-1800   Cholecalciferol 5,000 Units, Oral, Daily   clopidogrel (PLAVIX) 75 mg, Oral, Daily   lisinopril (ZESTRIL) 40 mg, Oral, Daily at bedtime   metFORMIN (GLUCOPHAGE-XR) 1,000 mg, Oral, 2 times daily   PARoxetine (PAXIL) 40 mg, Oral, Daily at bedtime   Potassium 99 MG TABS Oral, Daily   sildenafil (REVATIO) 20 mg, Oral, Daily PRN   tamsulosin (FLOMAX) 0.8 mg, Oral, Daily at bedtime    Current Facility-Administered Medications:    acetaminophen (TYLENOL) tablet 650 mg, 650 mg, Oral, Q6H PRN, 650 mg at 10/09/20 2115 **OR** acetaminophen (TYLENOL) suppository 650 mg, 650 mg, Rectal, Q6H PRN, Lucky Cowboy, Erskine Squibb, MD   amLODipine (NORVASC) tablet 5 mg, 5 mg, Oral, Daily, Dew, Erskine Squibb, MD, 5 mg at 10/11/20 [id number]   apixaban (ELIQUIS) tablet 10 mg, 10 mg, Oral, BID, 10 mg at 10/11/20 [id number] **FOLLOWED BY** [START ON 10/16/2020] apixaban (ELIQUIS) tablet 5 mg, 5 mg, Oral, BID, Deal, Justice Britain, RPH   atorvastatin (LIPITOR) tablet 40 mg, 40 mg, Oral, q1800, Algernon Huxley, MD, 40 mg at 10/10/20 2214   cholecalciferol (VITAMIN D3) tablet 5,000 Units, 5,000 Units, Oral, Daily, Algernon Huxley, MD, 5,000 Units at 10/11/20 [id number]   clopidogrel (PLAVIX) tablet 75 mg, 75 mg, Oral, Daily, Algernon Huxley, MD, 75 mg at 10/11/20 0823   insulin aspart (novoLOG) injection 0-9 Units, 0-9 Units, Subcutaneous, TID AC & HS, Dew, Erskine Squibb, MD, 1 Units at 10/10/20 2243   labetalol (NORMODYNE) injection 20 mg, 20 mg, Intravenous, Q3H PRN, Algernon Huxley, MD, 20 mg at  10/08/20 1623   lisinopril (ZESTRIL) tablet 40 mg, 40 mg, Oral, QHS, Dew, Erskine Squibb, MD, 40 mg at 10/10/20 2204   magnesium hydroxide (MILK OF MAGNESIA) suspension 30 mL, 30 mL, Oral, Daily PRN, Algernon Huxley, MD   megestrol (MEGACE) 400 MG/10ML suspension 400 mg, 400 mg, Oral, Daily, Roosevelt Locks, Dekui, MD, 400 mg at 10/11/20 0822   ondansetron (ZOFRAN) tablet 4 mg, 4 mg, Oral, Q6H PRN **OR** ondansetron (ZOFRAN) injection 4 mg, 4 mg, Intravenous, Q6H PRN, Lucky Cowboy, Erskine Squibb, MD, 4 mg at 10/08/20 1313   pantoprazole (PROTONIX) EC tablet 40 mg, 40 mg, Oral, BID, Sharen Hones, MD, 40 mg at 10/11/20 [id number]   PARoxetine (PAXIL) tablet 40 mg, 40 mg, Oral, QHS, Dew, Erskine Squibb, MD, 40 mg at 10/10/20 2203   promethazine (PHENERGAN) 12.5 mg in sodium chloride 0.9 % 50 mL IVPB, 12.5 mg, Intravenous, Q6H PRN, Mansy, Jan A, MD, Last Rate: 200 mL/hr at 10/08/20 2234, 12.5 mg at 10/08/20 2234  senna-docusate (Senokot-S) tablet 2 tablet, 2 tablet, Oral, BID, Sharen Hones, MD, 2 tablet at 10/11/20 [id number]   tamsulosin (FLOMAX) capsule 0.8 mg, 0.8 mg, Oral, QHS, Dew, Erskine Squibb, MD, 0.8 mg at 10/10/20 2202   traZODone (DESYREL) tablet 25 mg, 25 mg, Oral, QHS PRN, Algernon Huxley, MD, 25 mg at 10/09/20 2115   Family History  Problem Relation Age of Onset   Stroke Neg Hx     Social History:  reports that he has never smoked. He has never used smokeless tobacco. He reports current alcohol use. He reports that he does not use drugs.   Exam: Current vital signs: BP 121/85 (BP Location: Right Arm)   Pulse 68   Temp 97.9 F (36.6 C)   Resp 20   Ht '6\' 1"'$  (1.854 m)   Wt 117 kg   SpO2 99%   BMI 34.03 kg/m  Vital signs in last 24 hours: Temp:  [97.5 F (36.4 C)-98.8 F (37.1 C)] 97.9 F (36.6 C) (09/11 0740) Pulse Rate:  [66-83] 68 (09/11 0740) Resp:  [17-20] 20 (09/11 0740) BP: (114-145)/(72-95) 121/85 (09/11 0740) SpO2:  [98 %-99 %] 99 % (09/11 0740)   Physical Exam  Constitutional: Chronically ill-appearing and  lethargic Psych: Affect appropriate to situation, calm and cooperative Eyes: No scleral injection HENT: No oropharyngeal obstruction.  MSK: no joint deformities.  Cardiovascular: Normal rate and regular rhythm.  Respiratory: Effort normal, non-labored breathing GI: Well-healed scar, some firmness around the scar but no tenderness Skin: Warm dry and intact visible skin  Neuro: Mental Status: Patient is awake, alert, oriented to person, [facility], [facility], month, but not age, year, or details of situation. Cannot name low-frequency objects such as stethoscope, but otherwise language is intact Cranial Nerves: II: Visual Fields are full. Pupils are equal, round, and reactive to light.   III,IV, VI: EOMI without ptosis or diploplia.  Saccadic pursuits V: Facial sensation is symmetric to temperature VII: Facial movement is notable for mild left facial droop.  VIII: hearing is intact to voice X: Uvula elevates symmetrically XII: tongue is midline without atrophy or fasciculations.  Motor: Tone is normal. Bulk is normal.  He moves all of his extremities equally, though too fatigued for confrontational strength testing.  He does not have any pronator drift or leg drift Sensory: Sensation is symmetric to light touch and temperature in the arms and legs.  No extinction to double simultaneous stimuli Deep Tendon Reflexes: 3+ and symmetric in the biceps and patellae.  Cerebellar: FNF and HKS are intact bilaterally within limits of his global weakness  NIHSS total 4 Score breakdown: One-point for not answering age correctly, one-point for mild left facial droop (chronic), one-point for mild aphasia, and one-point for mild dysarthria (likely chronic)    I have reviewed labs in epic and the results pertinent to this consultation are:  Basic Metabolic Panel: Recent Labs  Lab 10/07/20 2041 10/08/20 1344 10/09/20 0739 10/10/20 0558 10/11/20 0428  NA 136 135 135 135 138  K 3.7 3.6  4.5 3.5 3.5  CL 97* 100 101 104 103  CO2 '29 28 23 24 29  '$ GLUCOSE 193* 179* 178* 135* 113*  BUN '18 18 19 16 20  '$ CREATININE 0.90 1.01 1.06 0.81 0.95  CALCIUM 8.9 8.4* 8.8* 8.4* 8.6*  MG  --   --  1.8 1.7 1.8    CBC: Recent Labs  Lab 10/05/20 0011 10/06/20 1012 10/07/20 2041 10/08/20 1344 10/08/20 2029 10/09/20 0739  WBC 9.4 10.6*  11.7*  --   --  9.9  NEUTROABS 7.4 9.5*  --   --   --   --   HGB 14.4 15.0 15.1 13.6 14.2 14.3  HCT 42.5 44.0 44.4 39.9 41.5 43.0  MCV 89.3 88.2 87.6  --   --  89.0  PLT 206 206 200  --   --  220    Coagulation Studies: No results for input(s): LABPROT, INR in the last 72 hours.   Lab Results  Component Value Date   HGBA1C 9.0 (H) 10/07/2020   Lab Results  Component Value Date   CHOL 122 09/29/2019   HDL 49 09/29/2019   LDLCALC 54 09/29/2019   TRIG 96 09/29/2019   CHOLHDL 2.5 09/29/2019     I have reviewed the images obtained:   MRI brain 9/11 personally reviewed, agree with radiology read: 1. No acute intracranial abnormality. 2. Stable MRI appearance of the brain from September 1st with advanced chronic small vessel disease including numerous chronic micro-hemorrhages in the bilateral deep gray nuclei.  Head CT 9/11 personally reviewed, agree with radiology No acute intracranial process, chronic microvascular disease  MRI brain 9/1 personally reviewed, agree with radiology 1. No acute intracranial abnormality. 2. Advanced atrophy and findings of chronic microvascular disease. Of note he does have significant microhemorrhages, largely in a subcortical pattern although there are cortical microhemorrhages as well  MRI lumbar spine 9/1 1. No acute abnormality of the lumbar spine. 2. L2-S1 PLIF without spinal canal stenosis. 3. Progression of severe left L1-2 neural foraminal stenosis. 4. Improvement of moderate left L3-4 and right L4-5 neural foraminal stenosis. 5. Unchanged moderate left L2-3 and bilateral L5-S1 neural  foraminal stenosis.  CT chest abdomen pelvis 9/7: 1. Study is positive for a large saddle embolus extending into lobar, segmental and subsegmental sized branches in the lungs bilaterally. There is also CT evidence of right heart strain (RV/LV Ratio = 1.24) consistent with at least submassive (intermediate risk) PE. The presence of right heart strain has been associated with an increased risk of morbidity and mortality. The presence of right heart strain has been associated with an increased risk of morbidity and mortality. Please refer to the "PE Focused" order set in EPIC.  2. Trace right pleural effusion lying dependently. 3. No other acute findings are noted elsewhere in the chest, abdomen or pelvis. 4. Unusual fluid collection in the anterior aspect of the abdomen which appears to be associated with and operative mesh, favored to represent a large postoperative seroma associated with mesh for prior attempted repair of ventral hernia. Immediately adjacent to this there is a recurrent ventral hernia which contains a short segment of small bowel. No evidence of associated bowel incarceration or obstruction at this time. 5. 7 mm nonobstructive calculus in the lower pole collecting system of left kidney. 6. Colonic diverticulosis without evidence of acute diverticulitis at this time. 7. Additional incidental findings, as above.    LE Duplex 9/8 IMPRESSION: Positive exam for right distal femoropopliteal DVT extending into the calf tibial and peroneal veins. No evidence of left lower extremity DVT.  ECHO 9/9:  1. Left ventricular ejection fraction, by estimation, is 40 to 45%. The left ventricle has mildly decreased function. The left ventricle  demonstrates global hypokinesis. There is mild left ventricular hypertrophy. Left ventricular diastolic parameters were normal.   2. Right ventricular systolic function is normal. The right ventricular size is normal.   3. The mitral valve is normal  in structure. No evidence of  mitral valve regurgitation.   4. The aortic valve is normal in structure. Aortic valve regurgitation is not visualized.    Impression: This is a 65 year old man with chronic comorbidities as listed above admitted with generalized weakness found to have a saddle pulmonary embolus status post embolectomy and on anticoagulation, with code stroke activated for concern for markedly worsened dysarthria and lethargy compared to recent baseline.  On review of the chart, he has had similar presentations for example in August 2021, however given his recent clinical course repeat brain imaging to rule out acute process is indicated  From a stroke risk perspective, patient is already medically maximized on Eliquis and Plavix (the latter is indicated for coronary artery disease, visible on his chest CT), and is meeting LDL goal less than 70 (LDL 54), though A1c is above goal at 9.0%  I favor that the pattern of microhemorrhages on his MRI brain represent sequelae of uncontrolled hypertension rather than cerebral amyloid angiopathy.  It is reassuring that his MRI is stable at this time.  Given the very strong indication for anticoagulation with his saddle pulmonary embolus as well as acute DVT, the risks of anticoagulation are outweighed by the benefit at this time and therefore anticoagulation should be continued.  Recommendations: -MRI brain to evaluate for potential new microhemorrhages or small stroke completed on my recommendation and stable as detailed above -Long-term continue to optimize A1c to goal of less than 7% -Okay to continue anticoagulation and Plavix at this time -Normotension blood pressure goal -Management of PE and comorbidities per primary team -Neurology will be available on an as-needed basis going forward, please reach out if new questions or concerns arise  Lesleigh Noe MD-PhD [facility] [phone] [facility] coverage for [facility]  is from 8 AM to 4 AM in-house and 4 PM to 8 PM by telephone/video. 8 PM to 8 AM emergent questions or overnight urgent questions should be addressed to Teleneurology On-call or [facility] neurohospitalist; contact information can be found on AMION

## 2020-10-11 NOTE — Progress Notes (Signed)
   10/11/20 0900  Clinical Encounter Type  Visited With Patient not available  Visit Type Code  Referral From Other (Comment) (Code Stroke)  Spiritual Encounters  Spiritual Needs Other (Comment) (not assessed)  Chaplain Burris arrived at Pt's room to follow up on Code Stroke. Pt was receiving care interventions and being prepared to go to CT scan. No family members present. Will attempt to follow up later to assess needs and offer support.

## 2020-10-11 NOTE — Progress Notes (Signed)
Dr. Roosevelt Locks initiated code stroke. CT done within 30 min. Neurology consulted and they ruled out stroke. They said they will follow up with MRI at a later time. Pt returned to room. Now resting comfortably

## 2020-10-12 ENCOUNTER — Encounter: Payer: Self-pay | Admitting: Family Medicine

## 2020-10-12 ENCOUNTER — Emergency Department (HOSPITAL_COMMUNITY): Payer: Medicare HMO

## 2020-10-12 ENCOUNTER — Other Ambulatory Visit: Payer: Self-pay

## 2020-10-12 ENCOUNTER — Emergency Department (HOSPITAL_COMMUNITY)
Admission: EM | Admit: 2020-10-12 | Discharge: 2020-10-13 | Disposition: A | Discharge status: Home / Self Care | Payer: Medicare HMO | Attending: Emergency Medicine | Admitting: Emergency Medicine

## 2020-10-12 DIAGNOSIS — W19XXXA Unspecified fall, initial encounter: Secondary | ICD-10-CM | POA: Insufficient documentation

## 2020-10-12 DIAGNOSIS — I10 Essential (primary) hypertension: Secondary | ICD-10-CM | POA: Insufficient documentation

## 2020-10-12 DIAGNOSIS — E041 Nontoxic single thyroid nodule: Secondary | ICD-10-CM | POA: Insufficient documentation

## 2020-10-12 DIAGNOSIS — R627 Adult failure to thrive: Secondary | ICD-10-CM

## 2020-10-12 DIAGNOSIS — Z7901 Long term (current) use of anticoagulants: Secondary | ICD-10-CM | POA: Insufficient documentation

## 2020-10-12 DIAGNOSIS — Z7984 Long term (current) use of oral hypoglycemic drugs: Secondary | ICD-10-CM | POA: Insufficient documentation

## 2020-10-12 DIAGNOSIS — S0101XA Laceration without foreign body of scalp, initial encounter: Secondary | ICD-10-CM | POA: Insufficient documentation

## 2020-10-12 DIAGNOSIS — Y92199 Unspecified place in other specified residential institution as the place of occurrence of the external cause: Secondary | ICD-10-CM | POA: Insufficient documentation

## 2020-10-12 DIAGNOSIS — Z515 Encounter for palliative care: Secondary | ICD-10-CM

## 2020-10-12 DIAGNOSIS — Z7902 Long term (current) use of antithrombotics/antiplatelets: Secondary | ICD-10-CM | POA: Insufficient documentation

## 2020-10-12 DIAGNOSIS — E119 Type 2 diabetes mellitus without complications: Secondary | ICD-10-CM | POA: Insufficient documentation

## 2020-10-12 DIAGNOSIS — S0990XA Unspecified injury of head, initial encounter: Secondary | ICD-10-CM

## 2020-10-12 DIAGNOSIS — Z79899 Other long term (current) drug therapy: Secondary | ICD-10-CM | POA: Insufficient documentation

## 2020-10-12 DIAGNOSIS — Z8673 Personal history of transient ischemic attack (TIA), and cerebral infarction without residual deficits: Secondary | ICD-10-CM | POA: Diagnosis not present

## 2020-10-12 DIAGNOSIS — Z7189 Other specified counseling: Secondary | ICD-10-CM

## 2020-10-12 DIAGNOSIS — I2699 Other pulmonary embolism without acute cor pulmonale: Secondary | ICD-10-CM | POA: Diagnosis not present

## 2020-10-12 LAB — CBC
HCT: 40.5 % (ref 39.0–52.0)
Hemoglobin: 13.5 g/dL (ref 13.0–17.0)
MCH: 29.4 pg (ref 26.0–34.0)
MCHC: 33.3 g/dL (ref 30.0–36.0)
MCV: 88.2 fL (ref 80.0–100.0)
Platelets: 249 10*3/uL (ref 150–400)
RBC: 4.59 MIL/uL (ref 4.22–5.81)
RDW: 12.9 % (ref 11.5–15.5)
WBC: 8.4 10*3/uL (ref 4.0–10.5)
nRBC: 0 % (ref 0.0–0.2)

## 2020-10-12 LAB — CBC WITH DIFFERENTIAL/PLATELET
Abs Immature Granulocytes: 0.09 10*3/uL — ABNORMAL HIGH (ref 0.00–0.07)
Basophils Absolute: 0.1 10*3/uL (ref 0.0–0.1)
Basophils Relative: 1 %
Eosinophils Absolute: 0.2 10*3/uL (ref 0.0–0.5)
Eosinophils Relative: 2 %
HCT: 42.2 % (ref 39.0–52.0)
Hemoglobin: 14 g/dL (ref 13.0–17.0)
Immature Granulocytes: 1 %
Lymphocytes Relative: 14 %
Lymphs Abs: 1.2 10*3/uL (ref 0.7–4.0)
MCH: 30 pg (ref 26.0–34.0)
MCHC: 33.2 g/dL (ref 30.0–36.0)
MCV: 90.6 fL (ref 80.0–100.0)
Monocytes Absolute: 0.8 10*3/uL (ref 0.1–1.0)
Monocytes Relative: 9 %
Neutro Abs: 6.6 10*3/uL (ref 1.7–7.7)
Neutrophils Relative %: 73 %
Platelets: 271 10*3/uL (ref 150–400)
RBC: 4.66 MIL/uL (ref 4.22–5.81)
RDW: 13.1 % (ref 11.5–15.5)
WBC: 8.9 10*3/uL (ref 4.0–10.5)
nRBC: 0 % (ref 0.0–0.2)

## 2020-10-12 LAB — COMPREHENSIVE METABOLIC PANEL
ALT: 12 U/L (ref 0–44)
AST: 16 U/L (ref 15–41)
Albumin: 2.8 g/dL — ABNORMAL LOW (ref 3.5–5.0)
Alkaline Phosphatase: 79 U/L (ref 38–126)
Anion gap: 10 (ref 5–15)
BUN: 19 mg/dL (ref 8–23)
CO2: 22 mmol/L (ref 22–32)
Calcium: 8.7 mg/dL — ABNORMAL LOW (ref 8.9–10.3)
Chloride: 102 mmol/L (ref 98–111)
Creatinine, Ser: 1.17 mg/dL (ref 0.61–1.24)
GFR, Estimated: >60 mL/min (ref >60)
Glucose, Bld: 161 mg/dL — ABNORMAL HIGH (ref 70–99)
Potassium: 3.5 mmol/L (ref 3.5–5.1)
Sodium: 134 mmol/L — ABNORMAL LOW (ref 135–145)
Total Bilirubin: 1.2 mg/dL (ref 0.3–1.2)
Total Protein: 5.8 g/dL — ABNORMAL LOW (ref 6.5–8.1)

## 2020-10-12 LAB — GLUCOSE, CAPILLARY
Glucose-Capillary: 132 mg/dL — ABNORMAL HIGH (ref 70–99)
Glucose-Capillary: 266 mg/dL — ABNORMAL HIGH (ref 70–99)

## 2020-10-12 LAB — RESP PANEL BY RT-PCR (FLU A&B, COVID) ARPGX2
Influenza A by PCR: NEGATIVE
Influenza B by PCR: NEGATIVE
SARS Coronavirus 2 by RT PCR: NEGATIVE

## 2020-10-12 MED ORDER — PANTOPRAZOLE SODIUM 40 MG PO TBEC: 40.0000 mg | DELAYED_RELEASE_TABLET | Freq: Two times a day (BID) | ORAL | Status: DC

## 2020-10-12 MED ORDER — ACETAMINOPHEN 500 MG PO TABS
1000.0000 mg | ORAL_TABLET | Freq: Once | ORAL | Status: AC
  Administered 2020-10-12: 1000 mg via ORAL
  Filled 2020-10-12: qty 2

## 2020-10-12 MED ORDER — APIXABAN 5 MG PO TABS: 10.0000 mg | ORAL_TABLET | Freq: Two times a day (BID) | ORAL | 0 refills | Status: DC

## 2020-10-12 MED ORDER — APIXABAN 5 MG PO TABS: 5.0000 mg | ORAL_TABLET | Freq: Two times a day (BID) | ORAL | 0 refills | Status: DC

## 2020-10-12 MED ORDER — SENNOSIDES-DOCUSATE SODIUM 8.6-50 MG PO TABS
2.0000 | ORAL_TABLET | Freq: Every evening | ORAL | Status: DC | PRN
Start: 2020-10-12 — End: 2022-03-16

## 2020-10-12 NOTE — ED Notes (Signed)
Applied pressure dressing to pt head.

## 2020-10-12 NOTE — Discharge Instructions (Addendum)
Use ice for 10 minutes at a time as needed for swelling and pain.  Use Tylenol every 4 hours as needed for pain.  Hold pressure to help with bleeding.  Watch for signs of infection.  Staples can be removed in approximately 10 days. Have ultrasound of your thyroid performed by a primary doctor ordering it in the next month.

## 2020-10-12 NOTE — Consult Note (Signed)
Consultation Note Date: 10/12/2020   Patient Name: Keith Carter  DOB: 1955-11-20  MRN: [id number]  Age / Sex: 65 y.o., male  PCP: Dayton Martes Victoriano Lain, NP Referring Physician: Max Sane, MD  Reason for Consultation: Establishing goals of care and Psychosocial/spiritual support  HPI/Patient Profile: 65 y.o. male  with past medical history of HTN/HLD, BPH, anxiety, history of TIA, DM2, spinal stenosis/degenerative disc disease, frequent falls at home admitted on 10/04/2020 with saddle pulmonary embolus with right heart strain with mechanical thrombectomy of left lower lobe pulmonary artery in the right lower lobe, middle lobe and upper lobe pulmonary arteries on 9/8.   Clinical Assessment and Goals of Care: I have reviewed medical records including EPIC notes, labs and imaging, received report from RN, assessed the patient.  Mr. Keith Carter, Keith Carter, is sitting up quietly in bed.  He greets me, making and mostly keeping eye contact he appears acutely/chronically ill and somewhat frail.  He does have some difficulty speaking due to prior TIA, but is alert and oriented, able to make his basic needs known.  There is no family at bedside at this time.    I introduced Palliative Medicine as specialized medical care for people living with serious illness. It focuses on providing relief from the symptoms and stress of a serious illness. The goal is to improve quality of life for both the patient and the family.   We meet to discuss diagnosis prognosis, GOC, EOL wishes, disposition and options.  We discussed a brief life review of the patient.  Keith Carter tells me that he lives with his wife, Katharine Look, who has Parkinson's.  He shares that they have been married 64 years.  He shares that they do have children together.  When asked if his children are available to help him he did not years.    We then focused on their current illness.  Keith Carter  is able to tell me clearly that he had his blood clots surgically treated.  He states that he has agreed to go to short-term rehab, but at this point is unsure of the location.  We talked about the need for continued blood thinners, and the risk for complications due to following.  The natural disease trajectory and expectations at EOL were discussed.  Advanced directives, concepts specific to code status, were considered and discussed. Keith Carter endorses DNR.   Palliative Care services outpatient were explained and offered.  At this point Keith Carter declines outpatient palliative services.  Discussed the importance of continued conversation with family and the medical providers regarding overall plan of care and treatment options, ensuring decisions are within the context of the patient's values and GOCs.  Questions and concerns were addressed.  The family was encouraged to call with questions or concerns.  PMT will continue to support holistically.  Conference with attending, bedside nursing staff, transition of care team related to patient condition, needs, goals of care, disposition.   HCPOA   NEXT OF KIN -Keith Carter names his wife of 45 years, Katharine Look, as his healthcare surrogate.  SUMMARY OF RECOMMENDATIONS   At this point continue to treat the treatable but no CPR or intubation. Agreeable to short-term rehab with the ultimate goal of returning to his own home. At this point rehospitalize as needed. Declines outpatient palliative services at this time.  Code Status/Advance Care Planning: DNR -goldenrod form completed and placed on chart  Symptom Management:  Per hospitalist, no additional needs at this time.  Palliative Prophylaxis:  Frequent Pain Assessment and Turn Reposition  Additional Recommendations (Limitations, Scope, Preferences): Continue to treat the treatable but no CPR or intubation  Psycho-social/Spiritual:  Desire for further Chaplaincy support:no Additional Recommendations:  Caregiving  Support/Resources  Prognosis:  Unable to determine, based on outcomes.  Guarded at this point, due to massive blood clots.  Discharge Planning:  Discharged to short-term rehab, [facility], with the ultimate goal of returning home.       Primary Diagnoses: Present on Admission:  Acute massive pulmonary embolism ([place])  Hypertension  Type 2 diabetes mellitus with hyperlipidemia ([place])   I have reviewed the medical record, interviewed the patient and family, and examined the patient. The following aspects are pertinent.  Past Medical History:  Diagnosis Date   Anxiety    Dyslipidemia    Enlarged prostate    Hypertension    Prediabetes    TIA (transient ischemic attack)    Social History   Socioeconomic History   Marital status: Married    Spouse name: Not on file   Number of children: Not on file   Years of education: Not on file   Highest education level: Not on file  Occupational History   Occupation: disabled due to back issues  Tobacco Use   Smoking status: Never   Smokeless tobacco: Never  Vaping Use   Vaping Use: Never used  Substance and Sexual Activity   Alcohol use: Yes   Drug use: Never   Sexual activity: Not on file  Other Topics Concern   Not on file  Social History Narrative   Not on file   Social Determinants of Health   Financial Resource Strain: Not on file  Food Insecurity: Not on file  Transportation Needs: Not on file  Physical Activity: Not on file  Stress: Not on file  Social Connections: Not on file   Family History  Problem Relation Age of Onset   Stroke Neg Hx    Scheduled Meds:  amLODipine  5 mg Oral Daily   apixaban  10 mg Oral BID   Followed by   Derrill Memo ON 10/16/2020] apixaban  5 mg Oral BID   atorvastatin  40 mg Oral q1800   cholecalciferol  5,000 Units Oral Daily   clopidogrel  75 mg Oral Daily   insulin aspart  0-9 Units Subcutaneous TID AC & HS   lisinopril  40 mg Oral QHS   megestrol  400 mg Oral  Daily   pantoprazole  40 mg Oral BID   PARoxetine  40 mg Oral QHS   senna-docusate  2 tablet Oral BID   tamsulosin  0.8 mg Oral QHS   Continuous Infusions:  promethazine (PHENERGAN) injection (IM or IVPB) 12.5 mg (10/08/20 2234)   PRN Meds:.acetaminophen **OR** acetaminophen, labetalol, magnesium hydroxide, ondansetron **OR** ondansetron (ZOFRAN) IV, promethazine (PHENERGAN) injection (IM or IVPB), traZODone Medications Prior to Admission:  Prior to Admission medications   Medication Sig Start Date End Date Taking? Authorizing Provider  amLODipine (NORVASC) 5 MG tablet Take 1 tablet (5 mg total) by mouth daily. 10/02/19  Yes Amin, Jeanella Flattery, MD  clopidogrel (PLAVIX) 75 MG tablet Take 75 mg by mouth daily. 10/11/19  Yes [provider]  lisinopril (ZESTRIL) 40 MG tablet Take 40 mg by mouth at bedtime.  03/10/19  Yes [provider]  metFORMIN (GLUCOPHAGE-XR) 500 MG 24 hr tablet Take 1,000 mg by mouth 2 (two) times daily. 03/10/19  Yes [provider]  tamsulosin (FLOMAX) 0.4 MG CAPS capsule Take 0.8 mg by mouth at bedtime.  02/26/19  Yes [provider]  acetaminophen (TYLENOL) 500 MG tablet Take 500 mg by mouth every 6 (six) hours as needed.     [provider]  apixaban (ELIQUIS) 5 MG TABS tablet Take 2 tablets (10 mg total) by mouth 2 (two) times daily for 3 days. 10 mg po bid till 10/15/2020 f/by 5 mg po BID starting 10/16/2020 10/12/20 10/15/20  Max Sane, MD  apixaban (ELIQUIS) 5 MG TABS tablet Take 1 tablet (5 mg total) by mouth 2 (two) times daily. 10/16/20 11/15/20  Max Sane, MD  atorvastatin (LIPITOR) 40 MG tablet Take 1 tablet (40 mg total) by mouth daily at 6 PM. 04/05/19   Loletha Grayer, MD  Cholecalciferol 25 MCG (1000 UT) tablet Take 5,000 Units by mouth daily.     [provider]  pantoprazole (PROTONIX) 40 MG tablet Take 1 tablet (40 mg total) by mouth 2 (two) times daily. 10/12/20   Max Sane, MD  PARoxetine (PAXIL) 40 MG  tablet Take 40 mg by mouth at bedtime.  02/21/19   [provider]  Potassium 99 MG TABS Take by mouth daily.    [provider]  senna-docusate (SENOKOT-S) 8.6-50 MG tablet Take 2 tablets by mouth at bedtime as needed for mild constipation. 10/12/20   Max Sane, MD  sildenafil (REVATIO) 20 MG tablet Take 20 mg by mouth daily as needed. 12/16/18   [provider]   No Known Allergies Review of Systems  Unable to perform ROS: Other   Physical Exam Vitals and nursing note reviewed.  Constitutional:      General: He is not in acute distress.    Appearance: He is ill-appearing.  Cardiovascular:     Rate and Rhythm: Normal rate.  Pulmonary:     Effort: Pulmonary effort is normal. No respiratory distress.  Skin:    General: Skin is warm and dry.  Neurological:     Mental Status: He is alert and oriented to person, place, and time.  Psychiatric:        Mood and Affect: Mood normal.        Behavior: Behavior normal.    Vital Signs: BP 104/68 (BP Location: Right Arm)   Pulse 95   Temp 98.5 F (36.9 C) (Oral)   Resp 17   Ht '6\' 1"'$  (1.854 m)   Wt 117 kg   SpO2 94%   BMI 34.03 kg/m  Pain Scale: 0-10 POSS *See Group Information*: S-Acceptable,Sleep, easy to arouse Pain Score: 0-No pain   SpO2: SpO2: 94 % O2 Device:SpO2: 94 % O2 Flow Rate: .O2 Flow Rate (L/min): 2 L/min  IO: Intake/output summary:  Intake/Output Summary (Last 24 hours) at 10/12/2020 1409 Last data filed at 10/12/2020 1100 Gross per 24 hour  Intake 600 ml  Output 501 ml  Net 99 ml    LBM: Last BM Date: 10/11/20 Baseline Weight: Weight: 117.9 kg Most recent weight: Weight: 117 kg     Palliative Assessment/Data:   Administrator, Civil Service Row Most  Recent Value  Intake Tab   Referral Department Hospitalist  Unit at Time of Referral Cardiac/Telemetry Unit  Palliative Care Primary Diagnosis Pulmonary  Date Notified 10/11/20  Palliative Care Type New Palliative care  Reason  for referral Clarify Goals of Care  Date of Admission 10/04/20  Date first seen by Palliative Care 10/12/20  # of days Palliative referral response time 1 Day(s)  # of days IP prior to Palliative referral 7  Clinical Assessment   Palliative Performance Scale Score 50%  Pain Max last 24 hours Not able to report  Pain Min Last 24 hours Not able to report  Dyspnea Max Last 24 Hours Not able to report  Dyspnea Min Last 24 hours Not able to report  Psychosocial & Spiritual Assessment   Palliative Care Outcomes        Time In: 1300 Time Out: 1350 Time Total: 50 minutes  Greater than 50%  of this time was spent counseling and coordinating care related to the above assessment and plan.  Signed by: Drue Novel, NP   Please contact Palliative Medicine Team phone at [phone] for questions and concerns.  For individual provider: See Shea Evans

## 2020-10-12 NOTE — TOC Transition Note (Signed)
Transition of Care [facility]) - CM/SW Discharge Note   Patient Details  Name: Keith Carter MRN: [id number] Date of Birth: [birth year]  Transition of Care [facility]) CM/SW Contact:  Eileen Stanford, LCSW Phone Number: 10/12/2020, 12:57 PM   Clinical Narrative:   Clinical Social Worker facilitated patient discharge including contacting patient family and facility to confirm patient discharge plans.  Clinical information faxed to facility and family agreeable with plan.  CSW arranged ambulance transport via [facility] to [facility](room 130 a).  RN to call [phone] for report prior to discharge.     Final next level of care: Skilled Nursing Facility Barriers to Discharge: No Barriers Identified   Patient Goals and CMS Choice Patient states their goals for this hospitalization and ongoing recovery are:: Get back home with wife      Discharge Placement              Patient chooses bed at:  [facility]) Patient to be transferred to facility by: [facility]   Patient and family notified of of transfer: 10/12/20  Discharge Plan and Services                                     Social Determinants of Health (SDOH) Interventions     Readmission Risk Interventions No flowsheet data found.

## 2020-10-12 NOTE — ED Notes (Signed)
Xray at the bedside.

## 2020-10-12 NOTE — ED Triage Notes (Signed)
Pt bib ems from [facility] where he had a fall. The pt is on Plavix and Eliquis. Pt stated he was at the facility and fell down. He has a one inch laceration on the back right side. Bleeding is controlled. Unwitnessed fall.   BP 116/76 HR 84 RA 96% CBG 118

## 2020-10-12 NOTE — Progress Notes (Signed)
Physical Therapy Treatment Patient Details Name: Keith Carter MRN: [id number] DOB: 1955-08-14 Today's Date: 10/12/2020   History of Present Illness 65 y.o. Caucasian male with medical history significant for hypertension, BPH,  dyslipidemia, and anxiety, TIA and DM2, who initially present to the emergency from 9/5 with weakness and falls.  While in the emergency room, patient was found to have hypoxemia, short of breath.  CT chest with contrast was obtained, showed a saddle PE and bilateral pulmonary emboli.  Patient was placed on heparin drip.  He he was seen by vascular surgery, pulmonary thrombectomy was performed on 9/8    PT Comments    Pt alert in bed, oriented to self,place, DOB; disoriented to day of week, situation. Pt is able to follow 1-step commands and cooperative throughout therapy. Communication is often garbled with non-sensical speech at times. Pt articulates best to questions/commands requiring one word responses.  Pt required MOD A for supine > sit due to poor ability to mobilize trunk w/ HOB elevated. Pt is able to move BLE to EOB. +2 physical assist sit > supine transfer for BLE and scooting to [place]. Pt demonstrated poor sitting balance this session requiring physical assistance due to significant R lateral trunk lean. Pt was able to reach to other bed rail with assist to R side of trunk and facilitation of LUE shoulder abd. Pt transfers with RW, MOD A + 2 physical assist for safety and momentum. Stepped w/ RW MOD A fwd, back, sideways x 1 requiring extensive cueing to LLE for mobility. Discharge recommendations are SNF due to significant change from PLOF. Skilled PT intervention is indicated to address deficits in function, mobility, and to return to PLOF as able.       Recommendations for follow up therapy are one component of a multi-disciplinary discharge planning process, led by the attending physician.  Recommendations may be updated based on patient status, additional  functional criteria and insurance authorization.  Follow Up Recommendations  SNF     Equipment Recommendations  Other (comment) (TBD next venue of care)    Recommendations for Other Services       Precautions / Restrictions Precautions Precautions: Fall Precaution Comments: high fall risk Restrictions Weight Bearing Restrictions: No     Mobility  Bed Mobility Overal bed mobility: Needs Assistance Bed Mobility: Supine to Sit;Sit to Supine     Supine to sit: Mod assist Sit to supine: Mod assist;+2 for physical assistance   General bed mobility comments: MOD A for scooting to [facility] and physical assist for trunk w/ heavy R lateral trunk lean upon sitting    Transfers Overall transfer level: Needs assistance Equipment used: Rolling walker (2 wheeled) Transfers: Sit to/from Stand Sit to Stand: Mod assist;+2 physical assistance         General transfer comment: Cues for hand placement on RW, physical assist for momentum  Ambulation/Gait Ambulation/Gait assistance: Mod assist Gait Distance (Feet): 1 Feet Assistive device: Rolling walker (2 wheeled) Gait Pattern/deviations: Trunk flexed     General Gait Details: Pt unable to move LLE without tactile assist requring MOD-A for upright support for taking 1 step forward, back, sidways   Stairs             Wheelchair Mobility    Modified Rankin (Stroke Patients Only)       Balance Overall balance assessment: History of Falls;Needs assistance Sitting-balance support: Feet supported;Bilateral upper extremity supported Sitting balance-Leahy Scale: Poor Sitting balance - Comments: Pt was unabe to maintain balance w/o  assistance due to R lateral trunk lean Postural control: Right lateral lean   Standing balance-Leahy Scale: Poor Standing balance comment: MOD A for both physical assist and safety                            Cognition Arousal/Alertness: Awake/alert Behavior During Therapy: Flat  affect Overall Cognitive Status: Impaired/Different from baseline                                 General Comments: oriented to person, place, DOB; disoriented to year, situation, day of the week; able to follow 1-step commands w/ repetition and overall cooperative      Exercises General Exercises - Lower Extremity Short Arc Quad: AROM;10 reps;Supine;Both Heel Slides: AROM;15 reps;Supine;Both Hip ABduction/ADduction: AROM;Both;15 reps;Supine    General Comments        Pertinent Vitals/Pain Pain Assessment: No/denies pain    Home Living                      Prior Function            PT Goals (current goals can now be found in the care plan section) Progress towards PT goals: Progressing toward goals    Frequency    Min 2X/week      PT Plan Current plan remains appropriate    Co-evaluation              AM-PAC PT "6 Clicks" Mobility   Outcome Measure  Help needed turning from your back to your side while in a flat bed without using bedrails?: A Lot Help needed moving from lying on your back to sitting on the side of a flat bed without using bedrails?: A Lot Help needed moving to and from a bed to a chair (including a wheelchair)?: A Lot Help needed standing up from a chair using your arms (e.g., wheelchair or bedside chair)?: A Lot Help needed to walk in hospital room?: A Lot Help needed climbing 3-5 steps with a railing? : Total 6 Click Score: 11    End of Session Equipment Utilized During Treatment: Gait belt Activity Tolerance: Patient tolerated treatment well Patient left: in bed;with call bell/phone within reach;with bed alarm set Nurse Communication: Mobility status (change in speech) PT Visit Diagnosis: Muscle weakness (generalized) (M62.81);History of falling (Z91.81);Repeated falls (R29.6);Difficulty in walking, not elsewhere classified (R26.2)     Time: [id number] PT Time Calculation (min) (ACUTE ONLY): 39  min  Charges:                        [employer], SPT

## 2020-10-12 NOTE — ED Notes (Signed)
Rewrapped pt head guaze

## 2020-10-12 NOTE — Progress Notes (Signed)
Patient has had no change in previous assessment. He has no complaints of pain. He is alert and confused but cooperative. He is being discharged to [place] and [facility] is picking him up. Report called to rehab facility.

## 2020-10-12 NOTE — Discharge Summary (Signed)
[facility] NAME: Keith Carter    MR#:  [id number]  DATE OF BIRTH:  10-12-1955  DATE OF ADMISSION:  10/04/2020   ADMITTING PHYSICIAN: Christel Mormon, MD  DATE OF DISCHARGE: 10/12/2020  PRIMARY CARE PHYSICIAN: [name], NP   ADMISSION DIAGNOSIS:  Pulmonary embolus ([place]) [I26.99] Weakness [R53.1] Abdominal pain [R10.9] Acute massive pulmonary embolism ([facility]) [I26.99] Impaired ambulation [R26.2] Saddle embolus of pulmonary artery, unspecified chronicity, unspecified whether acute cor pulmonale present ([place]) [I26.92] Pulmonary embolism, unspecified chronicity, unspecified pulmonary embolism type, unspecified whether acute cor pulmonale present ([place]) [I26.99] DISCHARGE DIAGNOSIS:  Active Problems:   Type 2 diabetes mellitus with hyperlipidemia ([place])   History of stroke   Hypertension   Acute massive pulmonary embolism ([place])   Hypertensive emergency   Failure to thrive in adult  SECONDARY DIAGNOSIS:   Past Medical History:  Diagnosis Date  . Anxiety   . Dyslipidemia   . Enlarged prostate   . Hypertension   . Prediabetes   . TIA (transient ischemic attack)    HOSPITAL COURSE:  65 year old male with a known history of hypertension, BPH, hyperlipidemia, anxiety, TIA diabetes mellitus type 2 was admitted for new onset PE and right lower extremity DVT.  Patient was feeling very weak and had falls at home.  Acute saddle PE Right lower extremity DVT Status post pulmonary thrombectomy by vascular surgery on 9/8 On Eliquis for anticoagulation and tolerating  Failure to thrive Frequent falls at home Generalized weakness Anorexia with nausea Dementia with delirium Palliative care to follow at SNF.  Overall poor prognosis  Type 2 diabetes Recommend strict control with A1c goal of less than 7%  Hypertensive emergency Blood pressure better controlled on current regimen  Chronic ventral hernia Stable DISCHARGE CONDITIONS:   Stable CONSULTS OBTAINED:   DRUG ALLERGIES:  No Known Allergies DISCHARGE MEDICATIONS:   Allergies as of 10/12/2020   No Known Allergies      Medication List     TAKE these medications    acetaminophen 500 MG tablet Commonly known as: TYLENOL Take 500 mg by mouth every 6 (six) hours as needed.   amLODipine 5 MG tablet Commonly known as: NORVASC Take 1 tablet (5 mg total) by mouth daily.   apixaban 5 MG Tabs tablet Commonly known as: ELIQUIS Take 2 tablets (10 mg total) by mouth 2 (two) times daily for 3 days. 10 mg po bid till 10/15/2020 f/by 5 mg po BID starting 10/16/2020   apixaban 5 MG Tabs tablet Commonly known as: ELIQUIS Take 1 tablet (5 mg total) by mouth 2 (two) times daily. Start taking on: October 16, 2020   atorvastatin 40 MG tablet Commonly known as: LIPITOR Take 1 tablet (40 mg total) by mouth daily at 6 PM.   Cholecalciferol 25 MCG (1000 UT) tablet Take 5,000 Units by mouth daily.   clopidogrel 75 MG tablet Commonly known as: PLAVIX Take 75 mg by mouth daily.   lisinopril 40 MG tablet Commonly known as: ZESTRIL Take 40 mg by mouth at bedtime.   metFORMIN 500 MG 24 hr tablet Commonly known as: GLUCOPHAGE-XR Take 1,000 mg by mouth 2 (two) times daily.   pantoprazole 40 MG tablet Commonly known as: PROTONIX Take 1 tablet (40 mg total) by mouth 2 (two) times daily.   PARoxetine 40 MG tablet Commonly known as: PAXIL Take 40 mg by mouth at bedtime.   Potassium 99 MG Tabs Take by mouth  daily.   senna-docusate 8.6-50 MG tablet Commonly known as: Senokot-S Take 2 tablets by mouth at bedtime as needed for mild constipation.   sildenafil 20 MG tablet Commonly known as: REVATIO Take 20 mg by mouth daily as needed.   tamsulosin 0.4 MG Caps capsule Commonly known as: FLOMAX Take 0.8 mg by mouth at bedtime.       DISCHARGE INSTRUCTIONS:   DIET:  Regular diet DISCHARGE CONDITION:  Stable ACTIVITY:  Activity as tolerated OXYGEN:   Home Oxygen: No.  Oxygen Delivery: room air DISCHARGE LOCATION:  nursing home with palliative care to follow  If you experience worsening of your admission symptoms, develop shortness of breath, life threatening emergency, suicidal or homicidal thoughts you must seek medical attention immediately by calling 911 or calling your MD immediately  if symptoms less severe.  You Must read complete instructions/literature along with all the possible adverse reactions/side effects for all the Medicines you take and that have been prescribed to you. Take any new Medicines after you have completely understood and accpet all the possible adverse reactions/side effects.   Please note  You were cared for by a hospitalist during your hospital stay. If you have any questions about your discharge medications or the care you received while you were in the hospital after you are discharged, you can call the unit and asked to speak with the hospitalist on call if the hospitalist that took care of you is not available. Once you are discharged, your primary care physician will handle any further medical issues. Please note that NO REFILLS for any discharge medications will be authorized once you are discharged, as it is imperative that you return to your primary care physician (or establish a relationship with a primary care physician if you do not have one) for your aftercare needs so that they can reassess your need for medications and monitor your lab values.    On the day of Discharge:  VITAL SIGNS:  Blood pressure 104/68, pulse 95, temperature 98.5 F (36.9 C), temperature source Oral, resp. rate 17, height '6\' 1"'$  (1.854 m), weight 117 kg, SpO2 94 %. PHYSICAL EXAMINATION:  GENERAL:  65 y.o.-year-old patient lying in the bed with no acute distress.  EYES: Pupils equal, round, reactive to light and accommodation. No scleral icterus. Extraocular muscles intact.  HEENT: Head atraumatic, normocephalic. Oropharynx  and nasopharynx clear.  NECK:  Supple, no jugular venous distention. No thyroid enlargement, no tenderness.  LUNGS: Normal breath sounds bilaterally, no wheezing, rales,rhonchi or crepitation. No use of accessory muscles of respiration.  CARDIOVASCULAR: S1, S2 normal. No murmurs, rubs, or gallops.  ABDOMEN: Soft, non-tender, non-distended. Bowel sounds present. No organomegaly or mass.  EXTREMITIES: No pedal edema, cyanosis, or clubbing.  NEUROLOGIC: Cranial nerves II through XII are intact. Muscle strength 5/5 in all extremities. Sensation intact. Gait not checked.  PSYCHIATRIC: The patient is alert and oriented x 3.  SKIN: No obvious rash, lesion, or ulcer.  DATA REVIEW:   CBC Recent Labs  Lab 10/12/20 0430  WBC 8.4  HGB 13.5  HCT 40.5  PLT 249    Chemistries  Recent Labs  Lab 10/06/20 1012 10/07/20 2041 10/11/20 0428  NA 139   < > 138  K 3.5   < > 3.5  CL 101   < > 103  CO2 27   < > 29  GLUCOSE 255*   < > 113*  BUN 14   < > 20  CREATININE 1.03   < >  0.95  CALCIUM 9.0   < > 8.6*  MG  --    < > 1.8  AST 15  --   --   ALT 14  --   --   ALKPHOS 81  --   --   BILITOT 1.6*  --   --    < > = values in this interval not displayed.     Outpatient follow-up  Follow-up Information     Gauger, Victoriano Lain, NP. Schedule an appointment as soon as possible for a visit in 3 day(s).   Specialty: Internal Medicine Why: [facility] Discharge F/UP Contact information: [address] [place] [address] [phone]         Algernon Huxley, MD. Schedule an appointment as soon as possible for a visit in 3 week(s).   Specialties: Vascular Surgery, Radiology, Interventional Cardiology Why: [facility] Discharge F/UP Contact information: [place] [address] [phone]                 30 Day Unplanned Readmission Risk Score    Flowsheet Row ED to Hosp-Admission (Current) from 10/04/2020 in [facility]  30 Day  Unplanned Readmission Risk Score (%) 14.36 Filed at 10/12/2020 1200       This score is the patient's risk of an unplanned readmission within 30 days of being discharged (0 -100%). The score is based on dignosis, age, lab data, medications, orders, and past utilization.   Low:  0-14.9   Medium: 15-21.9   High: 22-29.9   Extreme: 30 and above          Management plans discussed with the patient, family and they are in agreement.  CODE STATUS: DNR   TOTAL TIME TAKING CARE OF THIS PATIENT: 45 minutes.    Max Sane M.D on 10/12/2020 at 12:46 PM  [facility]   CC: Primary care physician; [name], NP   Note: This dictation was prepared with Dragon dictation along with smaller phrase technology. Any transcriptional errors that result from this process are unintentional.

## 2020-10-12 NOTE — Care Management Important Message (Signed)
Important Message  Patient Details  Name: Keith Carter MRN: [id number] Date of Birth: Jun 06, 1955   Medicare Important Message Given:  Yes     Dannette Barbara 10/12/2020, 2:02 PM

## 2020-10-12 NOTE — ED Provider Notes (Signed)
[facility] EMERGENCY DEPARTMENT Provider Note   CSN: [id number] Arrival date & time: 10/12/20  1832     History Chief Complaint  Patient presents with   Keith Carter is a 65 y.o. male.  Patient with history of high blood pressure, pulmonary embolism, chronic pain, stroke presents from [facility] after he had a fall.  Patient recalls tripping as he did not have his walker.  Patient is on Plavix and Eliquis.  Patient has cut with mild bleeding to the right scalp.  Patient denies any other concerns.      Past Medical History:  Diagnosis Date   Anxiety    Dyslipidemia    Enlarged prostate    Hypertension    Prediabetes    TIA (transient ischemic attack)     Patient Active Problem List   Diagnosis Date Noted   Failure to thrive in adult 10/11/2020   Hypertensive emergency 10/08/2020   Acute massive pulmonary embolism ([place]) 10/07/2020   Spondylosis without myelopathy or radiculopathy, lumbosacral region 08/18/2020   DDD (degenerative disc disease), lumbosacral 07/28/2020   Lumbar Grade 1 Anterolisthesis of L1 over L2 (2 mm anterior translation with flexion) 04/22/2020   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) 04/22/2020   Abnormal MRI, lumbar spine (01/10/2017) 03/16/2020   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   Lumbosacral facet hypertrophy (Multilevel) (Bilateral) 03/16/2020   Lumbar facet joint syndrome (Bilateral) 03/16/2020   Osteoarthritis of facet joint of lumbar spine 03/16/2020   Lumbar lateral recess stenosis (Multilevel) (Bilateral) 03/16/2020   Lumbosacral foraminal stenosis (Multilevel) (Bilateral) 03/16/2020   Lumbar central spinal stenosis, 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) 03/16/2020   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   Dextroscoliosis of lumbar spine 03/16/2020   Lumbar Grade 1 Retrolisthesis of L2/L3 and L3/L4 03/16/2020   Abnormal CT scan, lumbar spine (01/27/2020) 03/16/2020    Elevated hemoglobin A1c 03/16/2020   Chronic anticoagulation (PLAVIX) 03/16/2020   Anxiety 03/15/2020   Arthritis 03/15/2020   Erectile dysfunction 03/15/2020   Hypertension 03/15/2020   Chronic pain syndrome 03/15/2020   Pharmacologic therapy 03/15/2020   Disorder of skeletal system 03/15/2020   Problems influencing health status 03/15/2020   Expressive aphasia 10/08/2019   TIA (transient ischemic attack) 09/29/2019   Aphasia 09/28/2019   History of stroke 05/09/2019   Acute CVA (cerebrovascular accident) ([place]) 04/05/2019   Dysarthria    Type 2 diabetes mellitus with hyperlipidemia (HCC)    Essential hypertension    Obesity (BMI 30-39.9)    Benign prostatic hyperplasia without lower urinary tract symptoms    Stroke ([place]) 04/04/2019   Controlled type 2 diabetes mellitus without complication, without long-term current use of insulin ([place]) 09/06/2017   Pain of both elbows 04/14/2015   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[place]) 09/16/2014   Chronic low back pain (1ry area of Pain) (Bilateral) w/o sciatica 11/27/2013    Past Surgical History:  Procedure Laterality Date   BACK SURGERY     HERNIA REPAIR     PULMONARY THROMBECTOMY N/A 10/08/2020   Procedure: PULMONARY THROMBECTOMY;  Surgeon: Algernon Huxley, MD;  Location: [facility];  Service: Cardiovascular;  Laterality: N/A;       Family History  Problem Relation Age of Onset   Stroke Neg Hx     Social History   Tobacco Use   Smoking status: Never   Smokeless tobacco: Never  Vaping Use   Vaping Use: Never used  Substance Use  Topics   Alcohol use: Yes   Drug use: Never    Home Medications Prior to Admission medications   Medication Sig Start Date End Date Taking? Authorizing Provider  acetaminophen (TYLENOL) 500 MG tablet Take 500 mg by mouth every 6 (six) hours as needed.     [provider]  amLODipine (NORVASC) 5 MG tablet Take 1 tablet (5 mg total) by mouth daily. 10/02/19   Amin,  Jeanella Flattery, MD  apixaban (ELIQUIS) 5 MG TABS tablet Take 2 tablets (10 mg total) by mouth 2 (two) times daily for 3 days. 10 mg po bid till 10/15/2020 f/by 5 mg po BID starting 10/16/2020 10/12/20 10/15/20  Max Sane, MD  apixaban (ELIQUIS) 5 MG TABS tablet Take 1 tablet (5 mg total) by mouth 2 (two) times daily. 10/16/20 11/15/20  Max Sane, MD  atorvastatin (LIPITOR) 40 MG tablet Take 1 tablet (40 mg total) by mouth daily at 6 PM. 04/05/19   Loletha Grayer, MD  Cholecalciferol 25 MCG (1000 UT) tablet Take 5,000 Units by mouth daily.     [provider]  clopidogrel (PLAVIX) 75 MG tablet Take 75 mg by mouth daily. 10/11/19   [provider]  lisinopril (ZESTRIL) 40 MG tablet Take 40 mg by mouth at bedtime.  03/10/19   [provider]  metFORMIN (GLUCOPHAGE-XR) 500 MG 24 hr tablet Take 1,000 mg by mouth 2 (two) times daily. 03/10/19   [provider]  pantoprazole (PROTONIX) 40 MG tablet Take 1 tablet (40 mg total) by mouth 2 (two) times daily. 10/12/20   Max Sane, MD  PARoxetine (PAXIL) 40 MG tablet Take 40 mg by mouth at bedtime.  02/21/19   [provider]  Potassium 99 MG TABS Take by mouth daily.    [provider]  senna-docusate (SENOKOT-S) 8.6-50 MG tablet Take 2 tablets by mouth at bedtime as needed for mild constipation. 10/12/20   Max Sane, MD  sildenafil (REVATIO) 20 MG tablet Take 20 mg by mouth daily as needed. 12/16/18   [provider]  tamsulosin (FLOMAX) 0.4 MG CAPS capsule Take 0.8 mg by mouth at bedtime.  02/26/19   [provider]    Allergies    Patient has no known allergies.  Review of Systems   Review of Systems  Constitutional:  Negative for chills and fever.  HENT:  Negative for congestion.   Eyes:  Negative for visual disturbance.  Respiratory:  Negative for shortness of breath.   Cardiovascular:  Negative for chest pain.  Gastrointestinal:  Negative for abdominal pain and vomiting.   Genitourinary:  Negative for dysuria and flank pain.  Musculoskeletal:  Negative for back pain, neck pain and neck stiffness.  Skin:  Positive for wound. Negative for rash.  Neurological:  Positive for headaches. Negative for light-headedness.   Physical Exam Updated Vital Signs BP 121/84   Pulse 86   Temp 97.9 F (36.6 C) (Oral)   Resp 18   Ht 6' (1.829 m)   Wt 117 kg   SpO2 97%   BMI 34.98 kg/m   Physical Exam Vitals and nursing note reviewed.  Constitutional:      General: He is not in acute distress.    Appearance: He is well-developed.  HENT:     Head: Normocephalic.     Comments: Patient has 2 cm laceration mild bleeding and underlying hematoma right upper parietal scalp.  No midline cervical tenderness.  No other spine tenderness on palpation or step-off.  Mouth/Throat:     Mouth: Mucous membranes are moist.  Eyes:     General:        Right eye: No discharge.        Left eye: No discharge.     Conjunctiva/sclera: Conjunctivae normal.  Neck:     Trachea: No tracheal deviation.  Cardiovascular:     Rate and Rhythm: Normal rate and regular rhythm.     Heart sounds: No murmur heard. Pulmonary:     Effort: Pulmonary effort is normal.     Breath sounds: Normal breath sounds.  Abdominal:     General: There is no distension.     Palpations: Abdomen is soft.     Tenderness: There is no abdominal tenderness. There is no guarding.  Musculoskeletal:     Cervical back: Normal range of motion and neck supple. No rigidity.     Comments: Patient has no pain with movement of major joints bilateral or palpation of long bones.  Skin:    General: Skin is warm.     Capillary Refill: Capillary refill takes less than 2 seconds.     Findings: Bruising present.  Neurological:     Mental Status: He is alert.     Cranial Nerves: No cranial nerve deficit.     Comments: Patient has general weakness on exam, will slowly lift all extremities bilateral with equal strength against  gravity.  Gross sensation intact bilateral.  Psychiatric:     Comments: Tired appearing, answers questions    ED Results / Procedures / Treatments   Labs (all labs ordered are listed, but only abnormal results are displayed) Labs Reviewed  COMPREHENSIVE METABOLIC PANEL - Abnormal; Notable for the following components:      Result Value   Sodium 134 (*)    Glucose, Bld 161 (*)    Calcium 8.7 (*)    Total Protein 5.8 (*)    Albumin 2.8 (*)    All other components within normal limits  CBC WITH DIFFERENTIAL/PLATELET - Abnormal; Notable for the following components:   Abs Immature Granulocytes 0.09 (*)    All other components within normal limits    EKG None  Radiology CT ABDOMEN PELVIS WO CONTRAST  Result Date: 10/11/2020 CLINICAL DATA:  Abdominal pain, suspected hernia. EXAM: CT ABDOMEN AND PELVIS WITHOUT CONTRAST TECHNIQUE: Multidetector CT imaging of the abdomen and pelvis was performed following the standard protocol without IV contrast. COMPARISON:  10/07/2020 FINDINGS: Lower chest: Mild basilar atelectasis.  Trace effusion on the RIGHT. Hepatobiliary: Noncontrast appearance of liver and biliary tree is unremarkable. Pancreas: No abnormal pancreatic contour signs of peripancreatic inflammation. Spleen: Normal. Adrenals/Urinary Tract: Normal appearance of bilateral adrenal glands. Cysts of the LEFT kidney likely a combination of renal sinus and cortical cysts is unchanged. Interpolar calculus on the LEFT at 6 mm similarly unchanged. No RIGHT-sided calculi. No hydronephrosis. No perinephric or perivesical stranding. Stomach/Bowel: Sigmoid diverticular disease. No signs of pericolonic stranding. Appendix is normal. Stomach moderately distended. No adjacent stranding. Small bowel without signs of obstruction though there is herniation of small bowel into the ventral hernia adjacent to mesh abdominal wall reconstructive changes, see below. Small amount of stranding adjacent to reconstructive  changes but not about the bowel loops involved in the hernia and these show no change since previous imaging Vascular/Lymphatic: Scattered atherosclerosis of the abdominal aorta. Smooth contour of the IVC. There is no gastrohepatic or hepatoduodenal ligament lymphadenopathy. No retroperitoneal or mesenteric lymphadenopathy. No pelvic sidewall lymphadenopathy. Reproductive: Unremarkable Other: Signs of  prior ventral abdominal wall reconstruction with fluid surrounding mesh reconstructive changes and mild superficial stranding in the area. This ovoid collection measures 15 x 11 x 15 cm, as compared to recent imaging from October 07, 2020 when measured in a similar fashion showing no change no sign of pelvic ascites. No signs of free air. Musculoskeletal: Multilevel spinal fusion extends from L2 into the sacroiliac joints, this shows no change. No acute or destructive skeletal finding. IMPRESSION: Signs of prior ventral abdominal wall fluid collection likely associated with prior mesh reconstruction when compared to recent imaging this shows no change, similarly there is no change in the appearance of adjacent herniated small bowel loops which display no signs of stranding or obstruction. Findings again are favored to represent seroma and or chronic hematoma associated with mesh reconstructive changes displaced by fluid. Nonobstructive left nephrolithiasis. Sigmoid diverticular disease without evidence of acute diverticulitis. Small RIGHT-sided pleural effusion Aortic atherosclerosis. Electronically Signed   By: Zetta Bills M.D.   On: 10/11/2020 16:45   CT HEAD WO CONTRAST (5MM)  Result Date: 10/12/2020 CLINICAL DATA:  Fall EXAM: CT HEAD WITHOUT CONTRAST CT CERVICAL SPINE WITHOUT CONTRAST TECHNIQUE: Multidetector CT imaging of the head and cervical spine was performed following the standard protocol without intravenous contrast. Multiplanar CT image reconstructions of the cervical spine were also generated.  COMPARISON:  None. FINDINGS: CT HEAD FINDINGS Brain: There is no mass, hemorrhage or extra-axial collection. The size and configuration of the ventricles and extra-axial CSF spaces are normal. There is hypoattenuation of the periventricular white matter, most commonly indicating chronic ischemic microangiopathy. Vascular: No abnormal hyperdensity of the major intracranial arteries or dural venous sinuses. No intracranial atherosclerosis. Skull: The visualized skull base, calvarium and extracranial soft tissues are normal. Sinuses/Orbits: No fluid levels or advanced mucosal thickening of the visualized paranasal sinuses. No mastoid or middle ear effusion. The orbits are normal. CT CERVICAL SPINE FINDINGS Alignment: No static subluxation. Facets are aligned. Occipital condyles are normally positioned. Skull base and vertebrae: No acute fracture. Soft tissues and spinal canal: No prevertebral fluid or swelling. No visible canal hematoma. Disc levels: Multilevel facet hypertrophy Upper chest: No pneumothorax, pulmonary nodule or pleural effusion. Other: 2 cm left thyroid nodule IMPRESSION: 1. Chronic ischemic microangiopathy without acute intracranial abnormality. 2. No acute fracture or static subluxation of the cervical spine. 3. 2 cm left thyroid nodule. Recommend thyroid ultrasound. (Ref: J Am Coll Radiol. 2015 Feb;12(2): 143-50). Electronically Signed   By: Ulyses Jarred M.D.   On: 10/12/2020 19:36   CT HEAD WO CONTRAST (5MM)  Result Date: 10/11/2020 CLINICAL DATA:  65 year old male code stroke presentation with slurred speech and facial droop. EXAM: CT HEAD WITHOUT CONTRAST TECHNIQUE: Contiguous axial images were obtained from the base of the skull through the vertex without intravenous contrast. COMPARISON:  Recent brain MRI 10/01/2020. Head CT 10/05/2020 and earlier. FINDINGS: Brain: No midline shift, mass effect, or evidence of intracranial mass lesion. No ventriculomegaly. No acute intracranial hemorrhage  identified. Advanced, confluent bilateral cerebral white matter hypodensity appears stable from the recent exams. Chronic bilateral thalamic lacunar infarcts. Stable heterogeneity in the bilateral basal ganglia. No cortically based acute infarct identified.  ASPECTS 10. Vascular: Calcified atherosclerosis at the skull base. Tortuous intracranial arteries. No suspicious intracranial vascular hyperdensity. Skull: No acute osseous abnormality identified. Sinuses/Orbits: Visualized paranasal sinuses and mastoids are stable and well aerated. Other: No acute orbit or scalp soft tissue finding. IMPRESSION: 1. Continued stable CT appearance of advanced small vessel disease. No hemorrhage. ASPECTS  10. 2. These results were communicated to Dr. Curly Shores at 9:33 am on 10/11/2020 by text page via the [facility] messaging system. Electronically Signed   By: Genevie Ann M.D.   On: 10/11/2020 09:33   CT Cervical Spine Wo Contrast  Result Date: 10/12/2020 CLINICAL DATA:  Fall EXAM: CT HEAD WITHOUT CONTRAST CT CERVICAL SPINE WITHOUT CONTRAST TECHNIQUE: Multidetector CT imaging of the head and cervical spine was performed following the standard protocol without intravenous contrast. Multiplanar CT image reconstructions of the cervical spine were also generated. COMPARISON:  None. FINDINGS: CT HEAD FINDINGS Brain: There is no mass, hemorrhage or extra-axial collection. The size and configuration of the ventricles and extra-axial CSF spaces are normal. There is hypoattenuation of the periventricular white matter, most commonly indicating chronic ischemic microangiopathy. Vascular: No abnormal hyperdensity of the major intracranial arteries or dural venous sinuses. No intracranial atherosclerosis. Skull: The visualized skull base, calvarium and extracranial soft tissues are normal. Sinuses/Orbits: No fluid levels or advanced mucosal thickening of the visualized paranasal sinuses. No mastoid or middle ear effusion. The orbits are normal. CT  CERVICAL SPINE FINDINGS Alignment: No static subluxation. Facets are aligned. Occipital condyles are normally positioned. Skull base and vertebrae: No acute fracture. Soft tissues and spinal canal: No prevertebral fluid or swelling. No visible canal hematoma. Disc levels: Multilevel facet hypertrophy Upper chest: No pneumothorax, pulmonary nodule or pleural effusion. Other: 2 cm left thyroid nodule IMPRESSION: 1. Chronic ischemic microangiopathy without acute intracranial abnormality. 2. No acute fracture or static subluxation of the cervical spine. 3. 2 cm left thyroid nodule. Recommend thyroid ultrasound. (Ref: J Am Coll Radiol. 2015 Feb;12(2): 143-50). Electronically Signed   By: Ulyses Jarred M.D.   On: 10/12/2020 19:36   MR BRAIN WO CONTRAST  Result Date: 10/11/2020 CLINICAL DATA:  65 year old male  male code stroke presentation with slurred speech and facial droop. EXAM: MRI HEAD WITHOUT CONTRAST TECHNIQUE: Multiplanar, multiecho pulse sequences of the brain and surrounding structures were obtained without intravenous contrast. COMPARISON:  Plain head CT 0911 hours today.  Brain MRI 10/01/2020. FINDINGS: Brain: No restricted diffusion to suggest acute infarction. No midline shift, mass effect, evidence of mass lesion, ventriculomegaly, extra-axial collection or acute intracranial hemorrhage. Cervicomedullary junction and pituitary are within normal limits. Chronic lacunar infarcts in the bilateral corona radiata, deep gray matter nuclei. Additional Patchy and confluent bilateral cerebral white matter T2 and FLAIR hyperintensity. Numerous chronic microhemorrhages in the deep gray matter nuclei, with fewer scattered in the bilateral cerebral hemispheres. The brainstem and cerebellum are relatively spared. The signal changes appear stable from earlier this month. No cortical encephalomalacia identified. Vascular: Major intracranial vascular flow voids are stable. Intracranial artery tortuosity. Skull and  upper cervical spine: Chronic ligamentous degeneration about the odontoid. Visualized bone marrow signal is within normal limits. Sinuses/Orbits: Stable, negative. Other: Visible internal auditory structures appear normal. Negative visible scalp and face. IMPRESSION: 1. No acute intracranial abnormality. 2. Stable MRI appearance of the brain from September 1st with advanced chronic small vessel disease including numerous chronic micro-hemorrhages in the bilateral deep gray nuclei. Electronically Signed   By: Genevie Ann M.D.   On: 10/11/2020 11:54   DG Pelvis Portable  Result Date: 10/12/2020 CLINICAL DATA:  Fall EXAM: PORTABLE PELVIS 1-2 VIEWS COMPARISON:  None. FINDINGS: There is no evidence of pelvic fracture or diastasis. No pelvic bone lesions are seen. Extensive lumbosacral spinal hardware. IMPRESSION: Negative. Electronically Signed   By: Ulyses Jarred M.D.   On: 10/12/2020 19:28   DG Chest  Portable 1 View  Result Date: 10/12/2020 CLINICAL DATA:  Fall EXAM: PORTABLE CHEST 1 VIEW COMPARISON:  10/04/2020 FINDINGS: The heart size and mediastinal contours are within normal limits. Both lungs are clear. The visualized skeletal structures are unremarkable. IMPRESSION: No active disease. Electronically Signed   By: Ulyses Jarred M.D.   On: 10/12/2020 19:23    Procedures .Marland KitchenLaceration Repair  Date/Time: 10/12/2020 7:39 PM Performed by: Elnora Morrison, MD Authorized by: Elnora Morrison, MD   Consent:    Consent obtained:  Verbal   Consent given by:  Patient   Risks, benefits, and alternatives were discussed: yes     Risks discussed:  Pain and infection   Alternatives discussed:  No treatment Universal protocol:    Procedure explained and questions answered to patient or proxy's satisfaction: yes     Patient identity confirmed:  Verbally with patient Anesthesia:    Anesthesia method:  None Laceration details:    Location:  Scalp   Scalp location:  R parietal   Length (cm):  2   Depth (mm):   10 Exploration:    Limited defect created (wound extended): no     Hemostasis achieved with:  Direct pressure   Wound exploration: wound explored through full range of motion     Wound extent: no areolar tissue violation noted, no fascia violation noted, no foreign bodies/material noted and no muscle damage noted     Contaminated: no   Treatment:    Amount of cleaning:  Standard   Visualized foreign bodies/material removed: no     Debridement:  None Skin repair:    Repair method:  Staples   Number of staples:  5 Approximation:    Approximation:  Close Repair type:    Repair type:  Intermediate Post-procedure details:    Dressing:  Open (no dressing)   Procedure completion:  Tolerated   Medications Ordered in ED Medications  acetaminophen (TYLENOL) tablet 1,000 mg (1,000 mg Oral Given 10/12/20 1940)    ED Course  I have reviewed the triage vital signs and the nursing notes.  Pertinent labs & imaging results that were available during my care of the patient were reviewed by me and considered in my medical decision making (see chart for details).    MDM Rules/Calculators/A&P                           Patient presents after unwitnessed fall at nursing facility.  Patient says he recalls details and did not pass out.  With patient on anticoagulation multiple medical problems plan for CT scan of the head and neck, general blood work and screening x-ray.  Patient will need staples for the scalp.  Patient son fortunately arrived to provide further detail.  Patient was recently seen/admitted at [facility] for frequent falls and was sent to the nursing facility only to have a fall shortly after arrival.  Patient's been generally weaker recently.  Scalp laceration repaired with staples.  Blood work ordered and reviewed no acute abnormalities no significant anemia or electrolyte problems.  Mild hyponatremia 134, no significant anemia.  CT scans no acute fracture or internal bleeding.   Discussed with son patient stable for outpatient follow-up.  Discussed outpatient follow-up of ultrasound for thyroid.  Final Clinical Impression(s) / ED Diagnoses Final diagnoses:  Scalp laceration, initial encounter  Acute head injury, initial encounter  Fall, initial encounter  Left thyroid nodule    Rx / DC Orders ED Discharge Orders  None        Elnora Morrison, MD 10/12/20 [phone]

## 2020-10-13 NOTE — ED Notes (Signed)
Attempted report x 2 

## 2020-10-13 NOTE — ED Notes (Signed)
Provided pt Keith Carter and water per his request.

## 2020-10-13 NOTE — ED Notes (Signed)
Pt removed bandage from head. No bleeding noted at sutures. Ptar at bedside to pick up patient. Pt noted to be soiled. New brief placed prior to transport. 22g IV removed from R wrist

## 2020-10-13 NOTE — ED Notes (Signed)
Changed pt gauze.

## 2020-10-13 NOTE — ED Notes (Signed)
Pt gauze was saturated through. Notified EDP.

## 2020-10-13 NOTE — ED Notes (Signed)
EDP at the bedside.  ?

## 2020-10-13 NOTE — ED Provider Notes (Signed)
..  Laceration Repair  Date/Time: 10/13/2020 2:58 AM Performed by: Ripley Fraise, MD Authorized by: Ripley Fraise, MD   Anesthesia:    Anesthesia method:  None Laceration details:    Location:  Scalp   Length (cm):  2 Skin repair:    Repair method:  Staples   Number of staples:  2 Approximation:    Approximation:  Close Repair type:    Repair type:  Simple I was called to evaluate patient because his wound began to ooze blood.  He already received multiple staples. After cleaning the wound, I was able to place 2 additional staples without difficulty. Quick clot was applied to the wound, and then bandage was placed.  Patient tolerated well   Ripley Fraise, MD 10/13/20 0300

## 2020-10-13 NOTE — ED Notes (Signed)
RN cleaned laceration site. EDP place quick clot and wrapped pt head.

## 2020-10-13 NOTE — ED Notes (Signed)
Attempted report x1. 

## 2020-10-19 ENCOUNTER — Ambulatory Visit: Payer: Medicare HMO | Admitting: Pain Medicine

## 2020-11-06 ENCOUNTER — Encounter (INDEPENDENT_AMBULATORY_CARE_PROVIDER_SITE_OTHER): Payer: Medicare HMO | Admitting: Vascular Surgery

## 2020-11-12 ENCOUNTER — Encounter (HOSPITAL_COMMUNITY): Payer: Self-pay | Admitting: Radiology

## 2020-11-16 ENCOUNTER — Encounter: Payer: Self-pay | Admitting: Nurse Practitioner

## 2020-11-16 ENCOUNTER — Non-Acute Institutional Stay: Payer: Medicare HMO | Admitting: Nurse Practitioner

## 2020-11-16 VITALS — BP 101/69 | HR 82 | Temp 97.3°F | Resp 18 | Wt 205.3 lb

## 2020-11-16 DIAGNOSIS — Z515 Encounter for palliative care: Secondary | ICD-10-CM

## 2020-11-16 DIAGNOSIS — R5381 Other malaise: Secondary | ICD-10-CM

## 2020-11-16 DIAGNOSIS — R627 Adult failure to thrive: Secondary | ICD-10-CM

## 2020-11-16 DIAGNOSIS — F0390 Unspecified dementia without behavioral disturbance: Secondary | ICD-10-CM

## 2020-11-16 NOTE — Progress Notes (Signed)
[facility] Palliative Care Consult Note Telephone: [phone]  Fax: [phone]    Date of encounter: 11/16/20 8:21 PM PATIENT NAME: Keith Carter [place] [address] [place] [address]   [phone] (home)  DOB: [birth year] MRN: [id number] PRIMARY CARE PROVIDER:   [facility] Gauger, Keith Lain, NP,  [address] [place] [address] [phone] RESPONSIBLE PARTY:    Contact Information     Name Relation Home Work Mobile   Keith Carter Spouse [phone]     Keith Carter, Keith Carter Daughter [phone]  [phone]   Keith Carter   [phone]      I met face to face with patient and family in facility. Palliative Care was asked to follow this patient by consultation request of  Keith Carter to address advance care planning and complex medical decision making. This is a follow up visit.                                  ASSESSMENT AND PLAN / RECOMMENDATIONS:  Symptom Management/Plan: 1. Advance Care Planning; Full code, continue discussions with daughter  2. Goals of Care: Goals include to maximize quality of life and symptom management. Our advance care planning conversation included a discussion about:    The value and importance of advance care planning  Exploration of personal, cultural or spiritual beliefs that might influence medical decisions  Exploration of goals of care in the event of a sudden injury or illness  Identification and preparation of a healthcare agent  Review and updating or creation of an advance directive document.  3. Palliative care encounter; Palliative care encounter; Palliative medicine team will continue to support patient, patient's family, and medical team. Visit consisted of counseling and education dealing with the complex and emotionally intense issues of symptom management and palliative care in the setting of serious and potentially life-threatening illness  4. Debility  secondary to dementia Encourage therapy though limited with disease progression, will continue transition to LTC, fall risk  5. f/u 2 weeks for ongoing monitoring chronic disease progression, ongoing discussions complex medical decision making  Follow up Palliative Care Visit: Palliative care will continue to follow for complex medical decision making, advance care planning, and clarification of goals. Return 2 weeks or prn.  I spent 70 minutes providing this consultation starting at 3:00pm. More than 50% of the time in this consultation was spent in counseling and care coordination.  PPS: 40%  Chief Complaint: Initial Palliative consult for complex medical decision making  HISTORY OF PRESENT ILLNESS:  Keith Carter is a 65 y.o. year old male  with multiple medical problems including PE , dementia with delirium, FTT, BPH, HTN, DM, TIA. 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 Keith. Carter is followed by chronic pain with Keith Keith Carter. Keith. Carter was discharged to [facility]. Keith. Carter requires assistance with transfers, mobility, adl's including bathing, dressing, feeding. Keith. Carter requires assistance with feeding, poor appetite. Keith. Carter wife is also resident at [facility] currently. Staff endorses Keith. Carter continues generalized weakness, difficulty with motivation, mobility, getting participation with therapy. At present, Keith. Carter  is sitting in w/c with wife and neighbor Keith Carter. We talked about purpose of pc visit. We talked briefly about recent hospitalization, symptoms, debility, therapy though  limited with cognitive impairment, decline. Keith. Carter was cooperative with assessment. Emotional support provided. I called Keith Carter, Keith Carter daughter. Clinical update given. We talked about purpose pc visit. We talked about past medical  history, recent hospitalization. We talked about overall debility, decline, poor participation with therapy. We talked about frequent falls, appetite. We talked about low motivation. We talked about loss of independence, challenges with grieving process including wife currently resident at [facility]. We talked about plan for Keith and Keith Carter to transition to LTC, hopefully at [facility]. We talked about quality of life, staff resources provided. We talked about role pc in poc. Therapeutic listening, emotional support provided. We talked about f/u visit in 2 weeks or sooner if declines. I updated staff.  History obtained from review of EMR, discussion with facility staff and  Keith. Carter. With Keith. Carter present. Discussed with daughter Keith Carter per phone I reviewed available labs, medications, imaging, studies and related documents from the EMR.  Records reviewed and summarized above.   ROS Full 10 system review of systems performed and negative with exception of: as per HPI.   Physical Exam: Constitutional: NAD General: frail appearing, debilitated male EYES: lids intact ENMT: oral mucous membranes moist CV: S1S2, RRR Pulmonary: LCTA, no increased work of breathing, no cough, room air Abdomen: soft and non tender MSK: w/c dependent Skin: warm and dry Neuro:  + generalized weakness,  + cognitive impairment Psych: flat affect, A and Oriented to self, place  Questions and concerns were addressed. Provided general support and encouragement, no other unmet needs identified   Thank you for the opportunity to participate in the care of Keith. Kosman.  The palliative care team will continue to follow. Please call our office at [phone] if we can be of additional assistance.   This chart was dictated using voice recognition software.  Despite best efforts to proofread,  errors can occur which can change the documentation meaning.   Keith Carter Keith Gully, NP

## 2020-11-17 ENCOUNTER — Other Ambulatory Visit: Payer: Self-pay

## 2020-11-20 ENCOUNTER — Encounter: Payer: Self-pay | Admitting: Nurse Practitioner

## 2020-11-20 ENCOUNTER — Non-Acute Institutional Stay: Payer: Medicare HMO | Admitting: Nurse Practitioner

## 2020-11-20 DIAGNOSIS — R5381 Other malaise: Secondary | ICD-10-CM

## 2020-11-20 DIAGNOSIS — Z515 Encounter for palliative care: Secondary | ICD-10-CM

## 2020-11-20 DIAGNOSIS — F0390 Unspecified dementia without behavioral disturbance: Secondary | ICD-10-CM

## 2020-11-20 NOTE — Progress Notes (Signed)
[facility] Palliative Care Consult Note Telephone: [phone]  Fax: [phone]    Date of encounter: 11/20/20 9:39 PM PATIENT NAME: Keith Carter [place] [address] [place] [address]   [phone] (home)  DOB: 1955/03/20 MRN: [id number] PRIMARY CARE PROVIDER:    [name], NP,  [address] [place] [address] [phone]  REFERRING PROVIDER:  [facility] Gauger, Keith Lain, NP [place],  [place] [address] [phone]  RESPONSIBLE PARTY:    Contact Information     Name Relation Home Work Mobile   Keith Carter [phone]     Keith Carter Daughter [phone]  [phone]   Keith Carter   [phone]      I met face to face with patient in facility. Palliative Care was asked to follow this patient by consultation request of  Dr Keith Carter to address advance care planning and complex medical decision making. This is a follow up visit.                                  ASSESSMENT AND PLAN / RECOMMENDATIONS: Symptom Management/Plan: 1. Advance Care Planning; Full code, continue discussions with daughter   2. Goals of Care: Goals include to maximize quality of life and symptom management. Our advance care planning conversation included a discussion about:    The value and importance of advance care planning  Exploration of personal, cultural or spiritual beliefs that might influence medical decisions  Exploration of goals of care in the event of a sudden injury or illness  Identification and preparation of a healthcare agent  Review and updating or creation of an advance directive document.   3. Palliative care encounter; Palliative care encounter; Palliative medicine team will continue to support patient, patient's family, and medical team. Visit consisted of counseling and education dealing with the complex and emotionally intense issues of symptom management and palliative care  in the setting of serious and potentially life-threatening illness   4. Debility secondary to dementia Encourage therapy though limited with disease progression, will continue transition to LTC, fall risk   5. f/u 2 weeks for ongoing monitoring chronic disease progression, ongoing discussions complex medical decision making  Follow up Palliative Care Visit: Palliative care will continue to follow for complex medical decision making, advance care planning, and clarification of goals. Return 2 weeks or prn.  I spent 37 minutes providing this consultation. More than 50% of the time in this consultation was spent in counseling and care coordination.  PPS: 40%  Chief Complaint: Follow up palliative consult for complex medical decision making  HISTORY OF PRESENT ILLNESS:  Keith Carter is a 65 y.o. year old male  with multiple medical problems including PE , dementia with delirium, FTT, BPH, HTN, DM, TIA. 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 Keith Carter is followed by chronic pain with Dr Keith Carter. Keith Carter was discharged to [facility]. Keith Carter requires assistance with transfers, mobility, adl's including bathing, dressing, feeding. Keith Carter requires assistance with feeding, poor appetite. Keith Carter wife is also resident at [facility] currently. Staff endorses Keith Carter continues generalized weakness, difficulty with motivation, mobility, getting participation with therapy. At present, Keith Carter is sitting in the hall in a w/c getting ready  to go to therapy. Keith Carter did make eye contact, briefly answer simple questions though limited with cognitive impairment. Keith Carter was cooperative with assessment. Emotional support provided. Praised Keith Carter for going to therapy. I attempted to contact Keith Carter, daughter. Medical goals  reviewed. Updated staff.   History obtained from review of EMR, discussion with facility staff and Keith Carter.  I reviewed available labs, medications, imaging, studies and related documents from the EMR.  Records reviewed and summarized above.   ROS Full 10 system review of systems performed and negative with exception of: as per HPI.   Physical Exam: Constitutional: NAD General: chronically ill, debilitated cognitively impaired EYES: lids intact ENMT: oral mucous membranes moist CV: S1S2, RRR Pulmonary: LCTA, no increased work of breathing, no cough, room air Abdomen: normo-active BS + 4 quadrants, soft and non tender MSK: functional quadriplegic Skin: warm and dry Neuro:  + generalized weakness,  + cognitive impairment Psych: non-anxious affect, A and Oriented to person  Questions and concerns were addressed. Provided general support and encouragement, no other unmet needs identified   Thank you for the opportunity to participate in the care of Keith Carter.  The palliative care team will continue to follow. Please call our office at [phone] if we can be of additional assistance.   This office note has been dictated.   Keith Carter Keith Gully, NP

## 2020-11-23 ENCOUNTER — Other Ambulatory Visit: Payer: Self-pay

## 2020-12-28 ENCOUNTER — Encounter: Payer: Self-pay | Admitting: Nurse Practitioner

## 2020-12-28 ENCOUNTER — Other Ambulatory Visit: Payer: Self-pay

## 2020-12-28 ENCOUNTER — Non-Acute Institutional Stay: Payer: Medicare HMO | Admitting: Nurse Practitioner

## 2020-12-28 VITALS — Wt 207.3 lb

## 2020-12-28 DIAGNOSIS — Z515 Encounter for palliative care: Secondary | ICD-10-CM

## 2020-12-28 DIAGNOSIS — R5381 Other malaise: Secondary | ICD-10-CM

## 2020-12-28 DIAGNOSIS — F0390 Unspecified dementia without behavioral disturbance: Secondary | ICD-10-CM

## 2020-12-28 NOTE — Progress Notes (Signed)
[facility] Palliative Care Consult Note Telephone: [phone]  Fax: [phone]    Date of encounter: 12/28/20 7:11 PM PATIENT NAME: Keith Carter [address] [place] [address]   [phone] (home)  DOB: 06/28/1955 MRN: [id number] PRIMARY CARE PROVIDER:   [facility] [place], Victoriano Lain, NP,  [address] [place] [address] [phone] RESPONSIBLE PARTY:    Contact Information     Name Relation Home Work Mobile   [place] Spouse [phone]     Nikodem, [place] Daughter [phone]  [phone]   [place] Pandora Leiter   [phone]      I met face to face with patient and family in facility. Palliative Care was asked to follow this patient by consultation request of  Gauger, Victoriano Lain, * to address advance care planning and complex medical decision making. This is a follow up visit.                             ASSESSMENT AND PLAN / RECOMMENDATIONS: Symptom Management/Plan: 1. Advance Care Planning; Full code, continue discussions with daughter   2. Goals of Care: Goals include to maximize quality of life and symptom management. Our advance care planning conversation included a discussion about:    The value and importance of advance care planning  Exploration of personal, cultural or spiritual beliefs that might influence medical decisions  Exploration of goals of care in the event of a sudden injury or illness  Identification and preparation of a healthcare agent  Review and updating or creation of an advance directive document.   3. Palliative care encounter; Palliative care encounter; Palliative medicine team will continue to support patient, patient's family, and medical team. Visit consisted of counseling and education dealing with the complex and emotionally intense issues of symptom management and palliative care in the setting of serious and potentially life-threatening illness   4. Debility  secondary to dementia Encourage therapy though limited with disease progression, will continue transition to LTC, fall risk  98/70, 99.1, 207.3  12/04/2020 wbc 3.8, hgb 11.3, hct 34.7, platelets 174, sodium 137, potassium 4.7, chloride 108, CO 2 19,calcium 8.9, BUN 24.2, creatinine 1.64, glucose 87, total protein 5.1, albumin 2.9, ALT 13, AST 17    5. f/u 4 weeks for ongoing monitoring chronic disease progression, ongoing discussions complex medical decision making   Follow up Palliative Care Visit: Palliative care will continue to follow for complex medical decision making, advance care planning, and clarification of goals. Return 4 weeks or prn.  I spent 66 minutes providing this consultation. More than 50% of the time in this consultation was spent in counseling and care coordination.  PPS: 40%  Chief Complaint: Follow up palliative consult for complex medical decision making  HISTORY OF PRESENT ILLNESS:  Keith Carter is a 65 y.o. year old male  with multiple medical problems including PE , dementia with delirium, FTT, BPH, HTN, DM, TIA. 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 [name] is followed by chronic pain with Dr Lowella Dandy. [name] was discharged to [facility] at [facility]. [name] requires assistance with transfers, mobility, adl's including bathing, dressing, feeding. [name] requires assistance with feeding, with improving appetite. [name] per staff has become more interactive, coming out of his room more. [name]  has been going to his wife's room who also resides at [facility] with plans for both to transition to LTC. No recent falls, wounds, infections, further hospitalizations. [name] is a full code currently. At present, [name] is sitting in the w/c in the dining area with his wife finishing up lunch. Mr Geraci  did complete 100% of his meal. Observed [name] feeding himself. We talked about how he has been feeling. [name] endorses things are becoming improved. [name] talked about their daughter Keith Carter brought Thanksgiving, cooked herself and brought to facility. [name] endorses they were together, made it better. We talked about symptoms, he is sleeping better. We talked about appetite which is improving. We talked about his functional abilities, continues to try to be more self independence. Mr. Lebeda is able to maneuver his w/c without assistance. Mr. Wingard endorses it has been very slow but he has been trying. Using his arms to move his w/c. We talked about medical goals. We talked about changes in his and [name]'s life, quality of life. We talked about getting used to residing LTC as he transitions. We talked about role pc in poc. Therapeutic listening, emotional support provided. I called [name] daughter Keith Carter. We talked about update on PC visit, her visit with [name], symptoms, functional abilities, appetite, goals of care. We talked at length about code status. We reviewed MOST form, blank MOST form emailed to [facility] to further review with her siblings. We talked about role pc in poc. We talked about setting up a family meeting if wishes to further discuss code status. Currently [name] appears to be stable, continue to monitor with transition to LTC. [place] in agreement. I updated staff, no new changes recommended today.   History obtained from review of EMR, discussion with [name] in person with daughter  Keith Carter per phone I reviewed available labs, medications, imaging, studies and related documents from the EMR.  Records reviewed and summarized above.   ROS Full 10 system review of systems performed and negative with exception of: as per HPI.   Physical Exam: Constitutional: NAD General: frail appearing, pleasant, interactive, debilitated male EYES: lids  intact ENMT: oral mucous membranes moist CV: S1S2, RRR Pulmonary: LCTA, no increased work of breathing, no cough, room air Abdomen: soft and non tender MSK: w/c dependent Skin: warm and dry Neuro:  + generalized weakness,  +mild cognitive impairment Psych: flat affect, A and O x 3  This chart was dictated using voice recognition software.  Despite best efforts to proofread,  errors can occur which can change the documentation meaning.   Thank you for the opportunity to participate in the care of Mr. Lafoe.  The palliative care team will continue to follow. Please call our office at [phone] if we can be of additional assistance.   This chart was dictated using voice recognition software.  Despite best efforts to proofread,  errors can occur which can change the documentation meaning.   Kimble Hitchens Ihor Gully, NP

## 2021-02-17 ENCOUNTER — Other Ambulatory Visit: Payer: Self-pay

## 2021-02-17 ENCOUNTER — Emergency Department
Admission: EM | Admit: 2021-02-17 | Discharge: 2021-02-17 | Disposition: A | Discharge status: Skilled Nursing Facility | Payer: Medicare HMO | Attending: Emergency Medicine | Admitting: Emergency Medicine

## 2021-02-17 ENCOUNTER — Emergency Department: Payer: Medicare HMO

## 2021-02-17 DIAGNOSIS — S0990XA Unspecified injury of head, initial encounter: Secondary | ICD-10-CM | POA: Diagnosis present

## 2021-02-17 DIAGNOSIS — F0390 Unspecified dementia without behavioral disturbance: Secondary | ICD-10-CM | POA: Diagnosis not present

## 2021-02-17 DIAGNOSIS — W06XXXA Fall from bed, initial encounter: Secondary | ICD-10-CM | POA: Insufficient documentation

## 2021-02-17 DIAGNOSIS — S0083XA Contusion of other part of head, initial encounter: Secondary | ICD-10-CM | POA: Insufficient documentation

## 2021-02-17 DIAGNOSIS — I10 Essential (primary) hypertension: Secondary | ICD-10-CM | POA: Diagnosis not present

## 2021-02-17 DIAGNOSIS — E119 Type 2 diabetes mellitus without complications: Secondary | ICD-10-CM | POA: Diagnosis not present

## 2021-02-17 MED ORDER — ACETAMINOPHEN 500 MG PO TABS
1000.0000 mg | ORAL_TABLET | Freq: Once | ORAL | Status: AC
  Administered 2021-02-17: 1000 mg via ORAL
  Filled 2021-02-17: qty 2

## 2021-02-17 NOTE — ED Notes (Signed)
Report given to savonne at [facility] healthcare

## 2021-02-17 NOTE — ED Provider Notes (Signed)
[facility] Provider Note    Event Date/Time   First MD Initiated Contact with Patient 02/17/21 0840     (approximate)   History   Fall and Head Injury   HPI  Keith Carter is a 66 y.o. male  with multiple medical problems including PE , dementia with delirium, FTT, BPH, HTN, DM, TIA. 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  Patient states he has a right-sided headache but denies any other pain.  He is not sure if he passed out.  He is denying any other recent sick symptoms including fevers, chills, cough, vomiting, diarrhea, rash and is not sure when he last fell.  Per EMS report patient did have breakfast after this fall.  Was put back in bed per nurse practitioner rounded on patient sending him to the ED.      Physical Exam  Triage Vital Signs: ED Triage Vitals  Enc Vitals Group     BP      Pulse      Resp      Temp      Temp src      SpO2      Weight      Height      Head Circumference      Peak Flow      Pain Score      Pain Loc      Pain Edu?      Excl. in [place]?     Most recent vital signs: Vitals:   02/17/21 0847  BP: 123/83  Pulse: 66  Resp: 16  Temp: 98.3 F (36.8 C)  SpO2: 97%    General: Awake, no distress.  CV:  Good peripheral perfusion.  2+ radial pulses.  Slight systolic murmur.  No other murmurs rubs or gallops. Resp:  Normal effort.  Clear bilaterally. Abd:  No distention.  Soft throughout. Other:  Cranial nerves II through XII are grossly intact.  There is some right-sided periorbital ecchymosis.  No tenderness step-offs or deformities over the C/T/L-spine.  Patient has symmetric strength in his bilateral upper and lower extremities.  Sensation is intact to  light touch in all extremities.  No evidence of trauma to the extremities torso or otherwise about the face scalp head or neck.  Patient is pleasantly confused.  Not oriented.   ED Results / Procedures / Treatments  Labs (all labs ordered are listed, but only abnormal results are displayed) Labs Reviewed - No data to display   EKG    RADIOLOGY  I personally reviewed CT head, face and C-spine that I ordered.  There is no evidence of facial fracture, skull fracture, intracranial hemorrhage, edema, mass-effect, acute C-spine fracture or traumatic subluxation.  There is evidence of global parenchymal volume loss.  No other clear acute process.  I also reviewed radiology interpretation and agree with their findings.  PROCEDURES:  Critical Care performed: No  Procedures    MEDICATIONS ORDERED IN ED: Medications  acetaminophen (TYLENOL) tablet 1,000 mg (1,000 mg Oral Given 02/17/21 0854)     IMPRESSION / MDM / ASSESSMENT AND PLAN / ED COURSE  I reviewed the triage vital signs and the nursing notes.  Differential diagnosis includes, but is not limited to facial contusion versus possible underlying fracture and subarachnoid hemorrhage.  I have a low suspicion for syncope or seizure at this time.  Patient has no history exam features to suggest significant extremity or other visceral injury.  No history exam features to suggest acute infectious process or significant metabolic derangement.  Will reach caregiver at facility who confirmed the patient is not currently on Eliquis although she is not sure why this was discontinued and confirms that he is normally confused secondary to his underlying dementia.   I personally reviewed CT head, face and C-spine that I ordered.  There is no evidence of facial fracture, skull fracture, intracranial hemorrhage, edema, mass-effect, acute C-spine fracture or traumatic subluxation.  There is evidence of global parenchymal  volume loss.  No other clear acute process.  I also reviewed radiology interpretation and agree with their findings.  Given reassuring findings on CT I have a low suspicion for significant other immediate life-threatening process.  I suspect facial contusion.  Given stable vitals with reassuring exam I think patient is stable for discharge back to his nursing facility.  Discharged in stable condition.  Strict return precautions provided in writing.     FINAL CLINICAL IMPRESSION(S) / ED DIAGNOSES   Final diagnoses:  Injury of head, initial encounter  Contusion of face, initial encounter     Rx / DC Orders   ED Discharge Orders     None        Note:  This document was prepared using Dragon voice recognition software and may include unintentional dictation errors.   Lucrezia Starch, MD 02/17/21 [phone]

## 2021-02-17 NOTE — ED Notes (Signed)
Pt is sleepy at this time

## 2021-02-17 NOTE — ED Triage Notes (Signed)
Pt arrives via ems from [facility], pt was reaching for his water and rolled out of bed hitting the right side of his face. Pt was placed back into bed and fed breakfast then sent to the ER.Ems reports that the elevation from the floor is approx 2-3 ft and that pt is on plavix, pt's wife is also a resident and his roommate and denied any loc. Pt has bruising to the right eye. Pt is alert and able to answer some questions with a baseline of confusion and staff reported to ems that he is acting his normal self pt able to tstick his tongue out and some deviation noted to the right

## 2021-03-01 ENCOUNTER — Non-Acute Institutional Stay: Payer: Medicare HMO | Admitting: Nurse Practitioner

## 2021-03-01 ENCOUNTER — Encounter: Payer: Self-pay | Admitting: Nurse Practitioner

## 2021-03-01 ENCOUNTER — Other Ambulatory Visit: Payer: Self-pay

## 2021-03-01 VITALS — BP 128/69 | HR 72 | Temp 97.7°F | Resp 18 | Wt 230.8 lb

## 2021-03-01 DIAGNOSIS — R635 Abnormal weight gain: Secondary | ICD-10-CM

## 2021-03-01 DIAGNOSIS — R5381 Other malaise: Secondary | ICD-10-CM

## 2021-03-01 DIAGNOSIS — Z515 Encounter for palliative care: Secondary | ICD-10-CM

## 2021-03-01 DIAGNOSIS — F0390 Unspecified dementia without behavioral disturbance: Secondary | ICD-10-CM

## 2021-03-01 NOTE — Progress Notes (Signed)
[facility]er, [facility] Palliative Care Consult Note Telephone: [phone]  Fax: [phone]    Date of encounter: 03/01/21 8:17 PM PATIENT NAME: Keith Carter [address] [place] [address]   [phone] (home)  DOB: Feb 27, 1955 MRN: [id number] PRIMARY CARE PROVIDER:    [facility]  RESPONSIBLE PARTY:    Contact Information     Name Relation Home Work Mobile   [place] Spouse [phone]     Magnus, Crescenzo Daughter [phone]  [phone]   Okey Dupre   [phone]      I met face to face with patient in facility. Palliative Care was asked to follow this patient by consultation request of  [place] to address advance care planning and complex medical decision making. This is a follow up visit.                                  ASSESSMENT AND PLAN / RECOMMENDATIONS:  Symptom Management/Plan: 1. Advance Care Planning; Full code, continue discussions with daughter   2. Palliative care encounter; Palliative care encounter; Palliative medicine team will continue to support patient, patient's family, and medical team. Visit consisted of counseling and education dealing with the complex and emotionally intense issues of symptom management and palliative care in the setting of serious and potentially life-threatening illness   3. Debility secondary to dementia Encourage therapy though limited with disease progression, will continue transition to LTC, fall risk  4. Weight gain, continue to monitor weight; discussed nutrition, education done; may consider dietitian consult   11/10/2020 weight 205.3 lbs 01/05/2021 weight 215.9 lbs 02/19/2021 weight 230.8 lbs BMI 31.3   5. f/u 4 weeks for ongoing monitoring chronic disease progression, ongoing discussions complex medical decision making  Follow up Palliative Care Visit: Palliative care will continue to follow for complex medical decision making, advance care  planning, and clarification of goals. Return 8 weeks or prn.  I spent 69 minutes providing this consultation. More than 50% of the time in this consultation was spent in counseling and care coordination.  PPS: 50% Chief Complaint: Follow up palliative consult for complex medical decision making  HISTORY OF PRESENT ILLNESS:  Keith Carter is a 66 y.o. year old male  with multiple medical problems including PE , dementia with delirium, FTT, BPH, HTN, DM, TIA. 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 Keith Carter is followed by chronic pain with Dr Lowella Dandy. Keith Carter was discharged to [facility]. Keith Carter requires assistance with transfers, mobility, adl's including bathing, dressing, feeding. Keith Carter feeds himself with good appetite and 23+ weight gain. Keith Carter has changed rooms to be a room mate with his wife, Keith. Viscomi. No recent falls, wounds, infections, further hospitalizations. Mr. Howk is a full code currently. At present, [name] is sitting in the w/c in his room with his wife. We talked about how he has been feeling. [name] endorses he has been doing well, transitioned to LTC. Mr. Shin endorses he has settled in. We talked about facility living. We talked about ros, symptoms, appetite with weight gain, nutrition education. We talked about functional challenges. We talked about going to dining area for meals. Keith Carter endorses they have been eating more in their room for privacy. We talked  about symptoms, he is sleeping better. We talked about role pc in poc. Therapeutic listening, emotional support provided. I called [name] daughter [name]. We talked about update on PC visit, her visit with Keith Carter, symptoms, functional abilities, appetite, goals of care. We talked at length about code status. We reviewed MOST form,  blank MOST form emailed to [facility] to further review with her siblings. We talked about role pc in poc. We talked about setting up a family meeting if wishes to further discuss code status. Currently Keith Carter appears to be stable, continue to monitor with transition to LTC. Bethany in agreement. I updated staff, no new changes recommended today.   History obtained from review of EMR, discussion with primary team, and interview with family, facility staff/caregiver and/or Keith Carter.  I reviewed available labs, medications, imaging, studies and related documents from the EMR.  Records reviewed and summarized above.   ROS 10 point system reviewed with staff and Mr Antuna all negative except HPI  Physical Exam: Constitutional: NAD General: obese debilitated pleasant male EYES: lids intact ENMT: oral mucous membranes moist CV: S1S2, RRR Pulmonary: LCTA, no increased work of breathing, no cough, room air Abdomen: normo-active BS + 4 quadrants, soft and non tender MSK: wheelchair  Skin: warm and dry Neuro:  + generalized weakness,  + cognitive impairment Psych: non-anxious affect, A and O x 3 Thank you for the opportunity to participate in the care of Keith Carter.  The palliative care team will continue to follow. Please call our office at [phone] if we can be of additional assistance.   This chart was dictated using voice recognition software.  Despite best efforts to proofread,  errors can occur which can change the documentation meaning.   Questions and concerns were addressed. Provided general support and encouragement, no other unmet needs identified   Hobie Kohles Ihor Gully, NP

## 2021-04-19 ENCOUNTER — Other Ambulatory Visit: Payer: Self-pay

## 2021-04-19 ENCOUNTER — Non-Acute Institutional Stay: Payer: Medicare HMO | Admitting: Nurse Practitioner

## 2021-04-19 ENCOUNTER — Encounter: Payer: Self-pay | Admitting: Nurse Practitioner

## 2021-04-19 VITALS — BP 123/79 | HR 78 | Temp 98.0°F | Resp 18 | Wt 232.6 lb

## 2021-04-19 DIAGNOSIS — R5381 Other malaise: Secondary | ICD-10-CM

## 2021-04-19 DIAGNOSIS — Z515 Encounter for palliative care: Secondary | ICD-10-CM

## 2021-04-19 DIAGNOSIS — F0390 Unspecified dementia without behavioral disturbance: Secondary | ICD-10-CM

## 2021-04-19 DIAGNOSIS — R635 Abnormal weight gain: Secondary | ICD-10-CM

## 2021-04-19 NOTE — Progress Notes (Signed)
? ? ?Manufacturing engineer ?Community Palliative Care Consult Note ?Telephone: [phone]  ?Fax: [phone]  ? ? ?Date of encounter: 04/19/21 ?5:39 PM ?PATIENT NAME: Keith Carter ?[address] ?[place] [address]   ?[phone] (home)  ?DOB: 28-Aug-1955 ?MRN: [id number] ?PRIMARY CARE PROVIDER:    ?[facility] ? ?RESPONSIBLE PARTY:    ?Contact Information   ? ? Name Relation Home Work Mobile  ? Keith, Carter Spouse [phone]    ? Carter,Keith Daughter [phone]  [phone]  ? Carter,Keith Son   [phone]  ? ?  ? ?I met face to face with patient and family in facility. Palliative Care was asked to follow this patient by consultation request of  Keith Carter advance care planning and complex medical decision making. This is a follow up visit.                                  ?ASSESSMENT AND PLAN / RECOMMENDATIONS:  ?Symptom Management/Plan: ?1. Advance Care Planning; Full code, continue discussions with daughter; pending completion of MOST form, emailed to daughter ?  ?2. Palliative care encounter; Palliative care encounter; Palliative medicine team will continue to support patient, patient's family, and medical team. Visit consisted of counseling and education dealing with the complex and emotionally intense issues of symptom management and palliative care in the setting of serious and potentially life-threatening illness ?  ?3. Debility secondary to dementia ?Encourage therapy though limited with disease progression, will continue transition to LTC, fall risk ?  ?4. Weight gain, continue to monitor weight; discussed nutrition, education done; may consider dietitian consult  ?  ?11/10/2020 weight 205.3 lbs ?01/05/2021 weight 215.9 lbs ?02/19/2021 weight 230.8 lbs ?04/15/2021 weight 232.6 lbs ? ?03/05/2021 wbc 6.7; hgb 11.8; hct 36.1; platelets 237; sodium 140; potassium 4.2; chloride 106; Co2 23; calcium 9.1; bun 20.6; creatinine 1.27; glucose 159 ?  ?5.  f/u 8 weeks for ongoing monitoring chronic disease progression, ongoing discussions complex medical decision making ? ?Follow up Palliative Care Visit: Palliative care will continue to follow for complex medical decision making, advance care planning, and clarification of goals. Return 8 weeks or prn. ? ?I spent 38 minutes providing this consultation starting at 12:45 pm. More than 50% of the time in this consultation was spent in counseling and care coordination. ? ?PPS: 50% ? ?Chief Complaint: Follow up palliative consult for complex medical decision making ? ?HISTORY OF PRESENT ILLNESS:  Keith Carter is a 66 y.o. year old male  with multiple medical problems including PE , dementia with delirium, FTT, BPH, HTN, DM, TIA. Keith Carter is followed by chronic pain with Dr Keith Carter. Keith Carter resides at [place] [facility]. Keith Carter requires assistance with transfers, mobility to w/c where he is mobile propeling with his feet around the facility. Requires assistance with bathing, dressing. Keith Carter feeds himself with good appetite, continues weight gain. No recent falls, wounds, infections, further hospitalizations. Keith Carter is a full code currently with ongoing discussion with daughter about MOST form. At present, Keith Carter is sitting in the w/c in his room with his wife. We talked about how he has been feeling. [name] endorses he is doing well, ambulating with assistance with staff in the gym, walked 10 feet. We talked about Mr and Mrs Soffer being room-mates and it is working out well.  We talked about ros, symptoms, appetite with weight gain,  nutrition education. We talked about medical goals, quality of life, socialization. We talked about role pc in poc. Therapeutic listening, emotional support provided. I called Keith Carter daughter Keith Carter leaving message to return call to update on PC visit. Updated staff, no new changes today. Praised Mr Carter to continue self independence, working with therapy  with motivation to continue to wish to walk. ? ?History obtained from review of EMR, discussion with facility staff and  Keith Carter sith Mrs Carter ?I reviewed available labs, medications, imaging, studies and related documents from the EMR.  Records reviewed and summarized above.  ? ?ROS ?10 point system reviewed with Mr Carter and facility staff all negative except HPI ? ?Physical Exam: ?Constitutional: NAD ?General: obese, debilitated pleasant male ?EYES:  lids intact ?ENMT: oral mucous membranes moist ?CV: S1S2, RRR ?Pulmonary: LCTA, no increased work of breathing, no cough, room air ?Abdomen:  soft and non tender ?MSK: w/c dependent, able to propel with his feet; able to take steps with assistance ?Skin: warm and dry ?Neuro:  + generalized weakness,  no cognitive impairment ?Psych: non-anxious affect, A and O x 3 ?Thank you for the opportunity to participate in the care of Keith Carter.  The palliative care team will continue to follow. Please call our office at [phone] if we can be of additional assistance.  ? ?Keith Jedlicka Z Carnella Fryman, NP  ? ?   ?

## 2021-05-13 DIAGNOSIS — G300 Alzheimer's disease with early onset: Secondary | ICD-10-CM | POA: Diagnosis not present

## 2021-05-13 DIAGNOSIS — F411 Generalized anxiety disorder: Secondary | ICD-10-CM | POA: Diagnosis not present

## 2021-05-13 DIAGNOSIS — F331 Major depressive disorder, recurrent, moderate: Secondary | ICD-10-CM | POA: Diagnosis not present

## 2021-05-14 ENCOUNTER — Other Ambulatory Visit: Payer: Self-pay

## 2021-05-17 ENCOUNTER — Ambulatory Visit: Payer: Medicare HMO | Admitting: Pain Medicine

## 2021-05-23 NOTE — Progress Notes (Signed)
PROVIDER NOTE: Information contained herein reflects review and annotations entered in association with encounter. Interpretation of such information and data should be left to medically-trained personnel. Information provided to patient can be located elsewhere in the medical record under "Patient Instructions". Document created using STT-dictation technology, any transcriptional errors that may result from process are unintentional.  ?  ?Patient: Keith Carter  Service Category: E/M  Provider: Gaspar Cola, MD  ?DOB: 01-22-1956  DOS: 05/26/2021  Specialty: Interventional Pain Management  ?MRN: [id number]  Setting: Ambulatory outpatient  PCP: [name], NP  ?Type: Established Patient    Referring Provider: [name], *  ?Location: Office  Delivery: Face-to-face    ? ?HPI  ?Mr. Keith Carter, a 66 y.o. year old male, is here today because of his Chronic bilateral low back pain without sciatica [M54.50, G89.29]. [name] primary complain today is Back Pain (Low and left at the tailbone area, also having left buttock pain) ?Last encounter: My last encounter with him was on Visit date not found. ?Pertinent problems: Mr. 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 ?Pain Assessment: Severity of Chronic pain is reported as a 10-Worst pain ever/10. Location: Back (low and left, left buttock also) Left/ . Onset: More than a month ago (6 months). Quality: Burning, Stabbing. Timing: Intermittent. Modifying factor(s): medications, position changes. ?Vitals:  height is 6' (1.829 m) and weight is 135 lb (61.2 kg). His temporal temperature is 97.3 ?F (36.3 ?C) (abnormal). His blood pressure is 154/84 (abnormal) and his pulse is 73. His respiration is 18 and oxygen saturation is 100%.  ? ?Reason for encounter: evaluation of worsening, or previously known (established) problem.  According to the patient he has experienced recurrence of his back pain.  He has extensive fusion of the lumbar spine but just above that fusion he seems to have developed a spondylolisthesis with facet hypertrophy and arthropathy causing some of this axial back pain.  In the past we have done medial branch blocks of T12-L2 with good results.  This first procedure lasted from 05/26/2020 until 08/18/2020 when it had to be repeated.  He has not had any further treatments in the area since then and returns today indicating that the pain has began to come back and he will like to have treatment repeated. ? ?Pharmacotherapy Assessment  ?Analgesic: No chronic opioid analgesics therapy prescribed by our practice. None ?MME/day: 0 mg/day  ? ?Monitoring: ?[place] PMP: PDMP reviewed during this encounter.       ?Pharmacotherapy: No side-effects or adverse reactions reported. ?Compliance: No problems identified. ?Effectiveness: Clinically acceptable. ? ?Hart Rochester RN  05/26/2021  9:48 AM  Signed ?Safety precautions to be maintained throughout the outpatient stay will include: orient to surroundings, keep bed in low  position, maintain call bell  within reach at all times, provide assistance with transfer out of bed and ambulation.  ?   UDS:  ?Summary  ?Date Value Ref Range Status  ?03/16/2020 FINAL  Final  ?  Comment:  ?  ==================================================================== ?[facility] COMP DRUG ANALYSIS,UR ?==================================================================== ?Test                             Result       Flag       Units ?Drug Present and Declared for Prescription Verification ?  Paroxetine                     PRESENT      EXPECTED ? ?Drug Present not Declared for Prescription Verification ?  Ibuprofen                      PRESENT      UNEXPECTED ? ?Drug Absent but Declared for Prescription Verification ?  Acetaminophen                  Not Detected UNEXPECTED ?   Acetaminophen, as indicated in the declared medication list, is ?   not always detected even when used as directed. ? ?  Salicylate                     Not Detected UNEXPECTED ?   Aspirin, as indicated in the declared medication list, is not ?   always detected even when used as directed. ? ?==================================================================== ?Test                      Result    Flag   Units      Ref Range ?  Creatinine              300              mg/dL      >=20 ?==================================================================== ?Declared Medications: ? The flagging and interpretation on this report are based on the ? following declared medications.  Unexpected results may arise from ? inaccuracies in the declared medications. ? ? **Note: The testing scope of this panel includes these medications: ? ? Paroxetine (Paxil) ? ? **Note: The testing scope of this panel does not include small to ? moderate amounts of these reported medications: ? ? Acetaminophen (Tylenol) ? Aspirin (Aspirin 81) ? ? **Note: The testing scope of this panel does not include following ? reported medications: ? ? Amlodipine (Norvasc) ?  Atorvastatin (Lipitor) ? Celecoxib (Celebrex) ? Cholecalciferol ? Clopidogrel (Plavix) ? Lisinopril (Zestril) ? Metformin ? Potassium ? Sildenafil (Revatio) ? Tamsulosin (Flomax) ?==================================================================== ?For clinical consultation, please call [phone]. ?==================================================================== ?  ?  ? ?ROS  ?Constitutional: Denies any fever or chills ?Gastrointestinal: No reported hemesis, hematochezia, vomiting, or acute GI distress ?Musculoskeletal: Denies any acute onset joint swelling, redness, loss of ROM, or weakness ?Neurological: No reported episodes of acute onset apraxia, aphasia, dysarthria, agnosia, amnesia, paralysis, loss of coordination, or loss of consciousness ? ?Medication Review  ?Cholecalciferol, PARoxetine, Potassium, acetaminophen, amLODipine, apixaban, atorvastatin, clopidogrel, lisinopril, metFORMIN, pantoprazole, senna-docusate, sildenafil, and tamsulosin ? ?History Review  ?Allergy: Mr. Attia has No Known Allergies. ?Drug: Mr. Chrystal  reports no history of drug use. ?Alcohol:  reports current alcohol use. ?Tobacco:  reports that he has never smoked. He has never used smokeless tobacco. ?Social: Mr. Ahart  reports that he has never  smoked. He has never used smokeless tobacco. He reports current alcohol use. He reports that he does not use drugs. ?Medical:  has a past medical history of Anxiety, Dyslipidemia, Enlarged prostate, Hypertension, Prediabetes, and TIA (transient ischemic attack). ?Surgical: Mr. Spivack  has a past surgical history that includes Back surgery; Hernia repair; and PULMONARY THROMBECTOMY (N/A, 10/08/2020). ?Family: family history is not on file. ? ?Laboratory Chemistry Profile  ? ?Renal ?Lab Results  ?Component Value Date  ? BUN 19 10/12/2020  ? CREATININE 1.17 10/12/2020  ? BCR 17 03/16/2020  ? GFRAA 78 03/16/2020  ? GFRNONAA >60 10/12/2020  ?  Hepatic ?Lab Results  ?Component Value Date  ? AST  16 10/12/2020  ? ALT 12 10/12/2020  ? ALBUMIN 2.8 (L) 10/12/2020  ? ALKPHOS 79 10/12/2020  ?  ?Electrolytes ?Lab Results  ?Component Value Date  ? NA 134 (L) 10/12/2020  ? K 3.5 10/12/2020  ? CL 102 10/12/2020  ?

## 2021-05-26 ENCOUNTER — Other Ambulatory Visit: Payer: Self-pay

## 2021-05-26 ENCOUNTER — Ambulatory Visit: Payer: Medicare HMO | Attending: Pain Medicine | Admitting: Pain Medicine

## 2021-05-26 ENCOUNTER — Encounter: Payer: Self-pay | Admitting: Pain Medicine

## 2021-05-26 VITALS — BP 154/84 | HR 73 | Temp 97.3°F | Resp 18 | Ht 72.0 in | Wt 135.0 lb

## 2021-05-26 DIAGNOSIS — M47817 Spondylosis without myelopathy or radiculopathy, lumbosacral region: Secondary | ICD-10-CM | POA: Insufficient documentation

## 2021-05-26 DIAGNOSIS — Z7901 Long term (current) use of anticoagulants: Secondary | ICD-10-CM | POA: Insufficient documentation

## 2021-05-26 DIAGNOSIS — M4327 Fusion of spine, lumbosacral region: Secondary | ICD-10-CM | POA: Insufficient documentation

## 2021-05-26 DIAGNOSIS — M47816 Spondylosis without myelopathy or radiculopathy, lumbar region: Secondary | ICD-10-CM | POA: Diagnosis not present

## 2021-05-26 DIAGNOSIS — M961 Postlaminectomy syndrome, not elsewhere classified: Secondary | ICD-10-CM | POA: Diagnosis not present

## 2021-05-26 DIAGNOSIS — G8929 Other chronic pain: Secondary | ICD-10-CM | POA: Diagnosis not present

## 2021-05-26 DIAGNOSIS — N400 Enlarged prostate without lower urinary tract symptoms: Secondary | ICD-10-CM | POA: Diagnosis not present

## 2021-05-26 DIAGNOSIS — M479 Spondylosis, unspecified: Secondary | ICD-10-CM | POA: Diagnosis not present

## 2021-05-26 DIAGNOSIS — M545 Low back pain, unspecified: Secondary | ICD-10-CM | POA: Diagnosis not present

## 2021-05-26 DIAGNOSIS — M4316 Spondylolisthesis, lumbar region: Secondary | ICD-10-CM | POA: Diagnosis not present

## 2021-05-26 NOTE — Progress Notes (Signed)
Safety precautions to be maintained throughout the outpatient stay will include: orient to surroundings, keep bed in low position, maintain call bell within reach at all times, provide assistance with transfer out of bed and ambulation.  

## 2021-05-26 NOTE — Patient Instructions (Addendum)
______________________________________________________________________ ? ?Preparing for Procedure with Sedation ? ?NOTICE: Due to recent regulatory changes, starting on August 31, 2020, procedures requiring intravenous (IV) sedation will no longer be performed at the [facility].  These types of procedures are required to be performed at [facility] ambulatory surgery facility.  We are very sorry for the inconvenience. ? ?Procedure appointments are limited to planned procedures: ?No Prescription Refills. ?No disability issues will be discussed. ?No medication changes will be discussed. ? ?Instructions: ?Oral Intake: Do not eat or drink anything for at least 8 hours prior to your procedure. (Exception: Blood Pressure Medication. See below.) ?Transportation: A driver is required. You may not drive yourself after the procedure. ?Blood Pressure Medicine: Do not forget to take your blood pressure medicine with a sip of water the morning of the procedure. If your Diastolic (lower reading) is above 100 mmHg, elective cases will be cancelled/rescheduled. ?Blood thinners: These will need to be stopped for procedures. Notify our staff if you are taking any blood thinners. Depending on which one you take, there will be specific instructions on how and when to stop it. ?Diabetics on insulin: Notify the staff so that you can be scheduled 1st case in the morning. If your diabetes requires high dose insulin, take only ? of your normal insulin dose the morning of the procedure and notify the staff that you have done so. ?Preventing infections: Shower with an antibacterial soap the morning of your procedure. ?Build-up your immune system: Take 1000 mg of Vitamin C with every meal (3 times a day) the day prior to your procedure. ?Antibiotics: Inform the staff if you have a condition or reason that requires you to take antibiotics before dental procedures. ?Pregnancy: If you are pregnant, call and cancel the procedure. ?Sickness: If  you have a cold, fever, or any active infections, call and cancel the procedure. ?Arrival: You must be in the facility at least 30 minutes prior to your scheduled procedure. ?Children: Do not bring children with you. ?Dress appropriately: Bring dark clothing that you would not mind if they get stained. ?Valuables: Do not bring any jewelry or valuables. ? ?Reasons to call and reschedule or cancel your procedure: (Following these recommendations will minimize the risk of a serious complication.) ?Surgeries: Avoid having procedures within 2 weeks of any surgery. (Avoid for 2 weeks before or after any surgery). ?Flu Shots: Avoid having procedures within 2 weeks of a flu shots. (Avoid for 2 weeks before or after immunizations). ?Barium: Avoid having a procedure within 7-10 days after having had a radiological study involving the use of radiological contrast. (Myelograms, Barium swallow or enema study). ?Heart attacks: Avoid any elective procedures or surgeries for the initial 6 months after a "Myocardial Infarction" (Heart Attack). ?Blood thinners: It is imperative that you stop these medications before procedures. Let us know if you if you take any blood thinner.  ?Infection: Avoid procedures during or within two weeks of an infection (including chest colds or gastrointestinal problems). Symptoms associated with infections include: Localized redness, fever, chills, night sweats or profuse sweating, burning sensation when voiding, cough, congestion, stuffiness, runny nose, sore throat, diarrhea, nausea, vomiting, cold or Flu symptoms, recent or current infections. It is specially important if the infection is over the area that we intend to treat. ?Heart and lung problems: Symptoms that may suggest an active cardiopulmonary problem include: cough, chest pain, breathing difficulties or shortness of breath, dizziness, ankle swelling, uncontrolled high or unusually low blood pressure, and/or palpitations. If you are    experiencing any of these symptoms, cancel your procedure and contact your primary care physician for an evaluation. ? ?Remember:  ?Regular Business hours are:  ?Monday to Thursday 8:00 AM to 4:00 PM ? ?Provider's Schedule: ?Keith Naveira, MD:  ?Procedure days: Tuesday and Thursday 7:30 AM to 4:00 PM ? ?Keith Lateef, MD:  ?Procedure days: Monday and Wednesday 7:30 AM to 4:00 PM ?______________________________________________________________________ ? ____________________________________________________________________________________________ ? ?General Risks and Possible Complications ? ?Patient Responsibilities: It is important that you read this as it is part of your informed consent. It is our duty to inform you of the risks and possible complications associated with treatments offered to you. It is your responsibility as a patient to read this and to ask questions about anything that is not clear or that you believe was not covered in this document. ? ?Patient?s Rights: You have the right to refuse treatment. You also have the right to change your mind, even after initially having agreed to have the treatment done. However, under this last option, if you wait until the last second to change your mind, you may be charged for the materials used up to that point. ? ?Introduction: Medicine is not an exact science. Everything in Medicine, including the lack of treatment(s), carries the potential for danger, harm, or loss (which is by definition: Risk). In Medicine, a complication is a secondary problem, condition, or disease that can aggravate an already existing one. All treatments carry the risk of possible complications. The fact that a side effects or complications occurs, does not imply that the treatment was conducted incorrectly. It must be clearly understood that these can happen even when everything is done following the highest safety standards. ? ?No treatment: You can choose not to proceed with the  proposed treatment alternative. The ?PRO(s)? would include: avoiding the risk of complications associated with the therapy. The ?CON(s)? would include: not getting any of the treatment benefits. These benefits fall under one of three categories: diagnostic; therapeutic; and/or palliative. Diagnostic benefits include: getting information which can ultimately lead to improvement of the disease or symptom(s). Therapeutic benefits are those associated with the successful treatment of the disease. Finally, palliative benefits are those related to the decrease of the primary symptoms, without necessarily curing the condition (example: decreasing the pain from a flare-up of a chronic condition, such as incurable terminal cancer). ? ?General Risks and Complications: These are associated to most interventional treatments. They can occur alone, or in combination. They fall under one of the following six (6) categories: no benefit or worsening of symptoms; bleeding; infection; nerve damage; allergic reactions; and/or death. ?No benefits or worsening of symptoms: In Medicine there are no guarantees, only probabilities. No healthcare provider can ever guarantee that a medical treatment will work, they can only state the probability that it may. Furthermore, there is always the possibility that the condition may worsen, either directly, or indirectly, as a consequence of the treatment. ?Bleeding: This is more common if the patient is taking a blood thinner, either prescription or over the counter (example: Goody Powders, Fish oil, Aspirin, Garlic, etc.), or if suffering a condition associated with impaired coagulation (example: Hemophilia, cirrhosis of the liver, low platelet counts, etc.). However, even if you do not have one on these, it can still happen. If you have any of these conditions, or take one of these drugs, make sure to notify your treating physician. ?Infection: This is more common in patients with a compromised  immune system, either due to disease (example:   diabetes, cancer, human immunodeficiency virus [HIV], etc.), or due to medications or treatments (example: therapies used to treat cancer and rheumatological diseas

## 2021-05-27 ENCOUNTER — Telehealth: Payer: Self-pay

## 2021-05-27 DIAGNOSIS — F411 Generalized anxiety disorder: Secondary | ICD-10-CM | POA: Diagnosis not present

## 2021-05-27 DIAGNOSIS — F331 Major depressive disorder, recurrent, moderate: Secondary | ICD-10-CM | POA: Diagnosis not present

## 2021-05-27 DIAGNOSIS — G300 Alzheimer's disease with early onset: Secondary | ICD-10-CM | POA: Diagnosis not present

## 2021-05-27 NOTE — Telephone Encounter (Signed)
The nursing home wants you to fax over the order for him to stop his blood thinners before his procedure. He is scheduled for 06/10/21. Their fax number is [phone]  ?

## 2021-05-27 NOTE — Telephone Encounter (Signed)
Fax signed per FN and sent. 05/27/2021 per [facility]. ?

## 2021-05-31 DIAGNOSIS — M545 Low back pain, unspecified: Secondary | ICD-10-CM | POA: Diagnosis not present

## 2021-06-06 NOTE — Progress Notes (Signed)
PROVIDER NOTE: Interpretation of information contained herein should be left to medically-trained personnel. Specific patient instructions are provided elsewhere under "Patient Instructions" section of medical record. This document was created in part using STT-dictation technology, any transcriptional errors that may result from this process are unintentional.  ?Patient: Keith Carter ?Type: Established ?DOB: 1955-05-17 ?MRN: [id number] ?PCP: [name], NP  Service: Procedure ?DOS: 06/10/2021 ?Setting: Ambulatory ?Location: Ambulatory outpatient facility ?Delivery: Face-to-face Provider: Gaspar Cola, MD ?Specialty: Interventional Pain Management ?Specialty designation: 09 ?Location: Outpatient facility ?Ref. Prov.: Keith Carter, Keith Carter, *   ? ?Primary Reason for Visit: Interventional Pain Management Treatment. ?CC: Back Pain (lower) ?  ?Procedure:          ? Type: Lumbar Facet, Medial Branch Block(s) #3  ?Laterality: Bilateral  ?Level: T12, L1, & L2 Medial Branch Level(s). ?Imaging: Fluoroscopic guidance ?Anesthesia: Local anesthesia (1-2% Lidocaine) ?Anxiolysis: IV Versed 2.0 mg ?Sedation: None. ?DOS: 06/10/2021 ?Performed by: Gaspar Cola, MD ? ?Primary Purpose: Diagnostic/Therapeutic ?Indications: Low back pain severe enough to impact quality of life or function. ?1. Lumbar facet joint syndrome (Bilateral)   ?2. Spondylosis without myelopathy or radiculopathy, lumbosacral region   ?3. Lumbosacral facet hypertrophy (Multilevel) (Bilateral)   ?4. Lumbar Grade 1 Anterolisthesis of L1/L2 (2 mm anterior translation w/ flexion)   ?5. Lumbar Grade 1 Retrolisthesis of L2/L3 and L3/L4   ?6. DDD (degenerative disc disease), lumbosacral   ?7. Chronic low back pain (1ry area of Pain) (Bilateral) w/o sciatica   ?8. Chronic anticoagulation (PLAVIX)   ? ?NAS-11 Pain score:  ? Pre-procedure: 10-Worst pain ever/10  ? Post-procedure: 0-No pain/10  ? ?  ?Position / Prep / Materials:  ?Position: Prone   ?Prep solution: DuraPrep (Iodine Povacrylex [0.7% available iodine] and Isopropyl Alcohol, 74% w/w) ?Area Prepped: Posterolateral Lumbosacral Spine (Wide prep: From the lower border of the scapula down to the end of the tailbone and from flank to flank.) ? ?Materials:  ?Tray: Block ?Needle(s):  ?Type: Spinal  ?Gauge (G): 22  ?Length: 5-in ?Qty: 4 ? ?Pre-op H&P Assessment:  ?Mr. Keith Carter is a 66 y.o. (year old), male patient, seen today for interventional treatment. He  has a past surgical history that includes Back surgery; Hernia repair; and PULMONARY THROMBECTOMY (N/A, 10/08/2020). Mr. Keith Carter has a current medication list which includes the following prescription(s): acetaminophen, cholecalciferol, clopidogrel, duloxetine, lisinopril, paroxetine, sertraline, sildenafil, sitagliptin, tamsulosin, amlodipine, apixaban, apixaban, atorvastatin, metformin, pantoprazole, potassium, and senna-docusate. His primarily concern today is the Back Pain (lower) ? ?Initial Vital Signs:  ?Pulse/HCG Rate: 65ECG Heart Rate: 75 ?Temp: 97.6 ?F (36.4 ?C) ?Resp: 16 ?BP: (!) 151/90 ?SpO2: 100 % ? ?BMI: Estimated body mass index is 31.87 kg/m? as calculated from the following: ?  Height as of this encounter: 6' (1.829 m). ?  Weight as of this encounter: 235 lb (106.6 kg). ? ?Risk Assessment: ?Allergies: Reviewed. He has No Known Allergies.  ?Allergy Precautions: None required ?Coagulopathies: Reviewed. None identified.  ?Blood-thinner therapy: None at this time ?Active Infection(s): Reviewed. None identified. Mr. Keith Carter is afebrile ? ?Site Confirmation: Mr. Keith Carter was asked to confirm the procedure and laterality before marking the site ?Procedure checklist: Completed ?Consent: Before the procedure and under the influence of no sedative(s), amnesic(s), or anxiolytics, the patient was informed of the treatment options, risks and possible complications. To fulfill our ethical and legal obligations, as recommended by the American Medical  Association's Code of Ethics, I have informed the patient of my clinical impression; the nature and purpose of the  treatment or procedure; the risks, benefits, and possible complications of the intervention; the alternatives, including doing nothing; the risk(s) and benefit(s) of the alternative treatment(s) or procedure(s); and the risk(s) and benefit(s) of doing nothing. ?The patient was provided information about the general risks and possible complications associated with the procedure. These may include, but are not limited to: failure to achieve desired goals, infection, bleeding, organ or nerve damage, allergic reactions, paralysis, and death. ?In addition, the patient was informed of those risks and complications associated to Spine-related procedures, such as failure to decrease pain; infection (i.e.: Meningitis, epidural or intraspinal abscess); bleeding (i.e.: epidural hematoma, subarachnoid hemorrhage, or any other type of intraspinal or peri-dural bleeding); organ or nerve damage (i.e.: Any type of peripheral nerve, nerve root, or spinal cord injury) with subsequent damage to sensory, motor, and/or autonomic systems, resulting in permanent pain, numbness, and/or weakness of one or several areas of the body; allergic reactions; (i.e.: anaphylactic reaction); and/or death. ?Furthermore, the patient was informed of those risks and complications associated with the medications. These include, but are not limited to: allergic reactions (i.e.: anaphylactic or anaphylactoid reaction(s)); adrenal axis suppression; blood sugar elevation that in diabetics may result in ketoacidosis or comma; water retention that in patients with history of congestive heart failure may result in shortness of breath, pulmonary edema, and decompensation with resultant heart failure; weight gain; swelling or edema; medication-induced neural toxicity; particulate matter embolism and blood vessel occlusion with resultant organ, and/or  nervous system infarction; and/or aseptic necrosis of one or more joints. ?Finally, the patient was informed that Medicine is not an exact science; therefore, there is also the possibility of unforeseen or unpredictable risks and/or possible complications that may result in a catastrophic outcome. The patient indicated having understood very clearly. We have given the patient no guarantees and we have made no promises. Enough time was given to the patient to ask questions, all of which were answered to the patient's satisfaction. Mr. Perazzo has indicated that he wanted to continue with the procedure. ?Attestation: I, the ordering provider, attest that I have discussed with the patient the benefits, risks, side-effects, alternatives, likelihood of achieving goals, and potential problems during recovery for the procedure that I have provided informed consent. ?Date  Time: 06/10/2021 11:00 AM ? ?Pre-Procedure Preparation:  ?Monitoring: As per clinic protocol. Respiration, ETCO2, SpO2, BP, heart rate and rhythm monitor placed and checked for adequate function ?Safety Precautions: Patient was assessed for positional comfort and pressure points before starting the procedure. ?Time-out: I initiated and conducted the "Time-out" before starting the procedure, as per protocol. The patient was asked to participate by confirming the accuracy of the "Time Out" information. Verification of the correct person, site, and procedure were performed and confirmed by me, the nursing staff, and the patient. "Time-out" conducted as per Joint Commission's Universal Protocol (UP.01.01.01). ?Time: 1120 ? ?Description of Procedure:          ?Laterality: Bilateral. The procedure was performed in identical fashion on both sides. ?Targeted Levels:  T12, L1, & L2 Medial Branch Level(s) ? ?Safety Precautions: Aspiration looking for blood return was conducted prior to all injections. At no point did we inject any substances, as a needle was being  advanced. Before injecting, the patient was told to immediately notify me if he was experiencing any new onset of "ringing in the ears, or metallic taste in the mouth". No attempts were made at seeking any paresthesias

## 2021-06-10 ENCOUNTER — Ambulatory Visit
Admission: RE | Admit: 2021-06-10 | Discharge: 2021-06-10 | Disposition: A | Discharge status: Home / Self Care | Payer: Medicare HMO | Source: Ambulatory Visit | Attending: Pain Medicine | Admitting: Pain Medicine

## 2021-06-10 ENCOUNTER — Encounter: Payer: Self-pay | Admitting: Pain Medicine

## 2021-06-10 ENCOUNTER — Ambulatory Visit (HOSPITAL_BASED_OUTPATIENT_CLINIC_OR_DEPARTMENT_OTHER): Payer: Medicare HMO | Admitting: Pain Medicine

## 2021-06-10 VITALS — BP 143/93 | HR 65 | Temp 97.6°F | Resp 16 | Ht 72.0 in | Wt 235.0 lb

## 2021-06-10 DIAGNOSIS — M4316 Spondylolisthesis, lumbar region: Secondary | ICD-10-CM | POA: Diagnosis not present

## 2021-06-10 DIAGNOSIS — M47817 Spondylosis without myelopathy or radiculopathy, lumbosacral region: Secondary | ICD-10-CM | POA: Diagnosis not present

## 2021-06-10 DIAGNOSIS — M4310 Spondylolisthesis, site unspecified: Secondary | ICD-10-CM | POA: Insufficient documentation

## 2021-06-10 DIAGNOSIS — M5137 Other intervertebral disc degeneration, lumbosacral region: Secondary | ICD-10-CM

## 2021-06-10 DIAGNOSIS — M47816 Spondylosis without myelopathy or radiculopathy, lumbar region: Secondary | ICD-10-CM | POA: Insufficient documentation

## 2021-06-10 DIAGNOSIS — G8929 Other chronic pain: Secondary | ICD-10-CM | POA: Diagnosis not present

## 2021-06-10 DIAGNOSIS — Z7901 Long term (current) use of anticoagulants: Secondary | ICD-10-CM | POA: Insufficient documentation

## 2021-06-10 DIAGNOSIS — M545 Low back pain, unspecified: Secondary | ICD-10-CM | POA: Diagnosis not present

## 2021-06-10 MED ORDER — MIDAZOLAM HCL 5 MG/5ML IJ SOLN
0.5000 mg | Freq: Once | INTRAMUSCULAR | Status: AC
  Administered 2021-06-10: 2 mg via INTRAVENOUS
  Filled 2021-06-10: qty 5

## 2021-06-10 MED ORDER — ROPIVACAINE HCL 2 MG/ML IJ SOLN
18.0000 mL | Freq: Once | INTRAMUSCULAR | Status: AC
  Administered 2021-06-10: 18 mL via PERINEURAL
  Filled 2021-06-10: qty 20

## 2021-06-10 MED ORDER — PENTAFLUOROPROP-TETRAFLUOROETH EX AERO
INHALATION_SPRAY | Freq: Once | CUTANEOUS | Status: AC
Start: 2021-06-10 — End: 2021-06-10
  Administered 2021-06-10: 30 via TOPICAL

## 2021-06-10 MED ORDER — LACTATED RINGERS IV SOLN
1000.0000 mL | Freq: Once | INTRAVENOUS | Status: AC
  Administered 2021-06-10: 1000 mL via INTRAVENOUS

## 2021-06-10 MED ORDER — TRIAMCINOLONE ACETONIDE 40 MG/ML IJ SUSP
80.0000 mg | Freq: Once | INTRAMUSCULAR | Status: AC
  Administered 2021-06-10: 80 mg
  Filled 2021-06-10: qty 2

## 2021-06-10 MED ORDER — LIDOCAINE HCL 2 % IJ SOLN
20.0000 mL | Freq: Once | INTRAMUSCULAR | Status: AC
Start: 2021-06-10 — End: 2021-06-10
  Administered 2021-06-10: 400 mg
  Filled 2021-06-10: qty 40

## 2021-06-10 NOTE — Progress Notes (Signed)
Safety precautions to be maintained throughout the outpatient stay will include: orient to surroundings, keep bed in low position, maintain call bell within reach at all times, provide assistance with transfer out of bed and ambulation.  

## 2021-06-10 NOTE — Patient Instructions (Signed)

## 2021-06-11 ENCOUNTER — Telehealth: Payer: Self-pay | Admitting: *Deleted

## 2021-06-11 NOTE — Telephone Encounter (Signed)
Post procedure call;  spoke with Joellen Jersey and [facility] skilled facility.  Asked them to let patient know that if he has any concerns to call our office.  Joellen Jersey states she will give him the message.  ?

## 2021-06-24 DIAGNOSIS — I2699 Other pulmonary embolism without acute cor pulmonale: Secondary | ICD-10-CM | POA: Diagnosis not present

## 2021-06-24 DIAGNOSIS — G300 Alzheimer's disease with early onset: Secondary | ICD-10-CM | POA: Diagnosis not present

## 2021-06-24 DIAGNOSIS — F411 Generalized anxiety disorder: Secondary | ICD-10-CM | POA: Diagnosis not present

## 2021-06-24 DIAGNOSIS — F331 Major depressive disorder, recurrent, moderate: Secondary | ICD-10-CM | POA: Diagnosis not present

## 2021-06-25 ENCOUNTER — Non-Acute Institutional Stay: Payer: Medicare HMO | Admitting: Nurse Practitioner

## 2021-06-25 ENCOUNTER — Encounter: Payer: Self-pay | Admitting: Nurse Practitioner

## 2021-06-25 VITALS — BP 152/88 | HR 80 | Temp 98.2°F | Resp 18 | Wt 230.4 lb

## 2021-06-25 DIAGNOSIS — Z515 Encounter for palliative care: Secondary | ICD-10-CM

## 2021-06-25 DIAGNOSIS — R5381 Other malaise: Secondary | ICD-10-CM

## 2021-06-25 DIAGNOSIS — F0390 Unspecified dementia without behavioral disturbance: Secondary | ICD-10-CM | POA: Diagnosis not present

## 2021-06-25 NOTE — Progress Notes (Addendum)
Keith Carter Consult Note Telephone: [phone]  Fax: [phone]    Date of encounter: 06/25/21 6:33 PM PATIENT NAME: Keith Carter [id number]   [phone] (home)  DOB: October 23, 1955 MRN: [id number] PRIMARY CARE PROVIDER:   [facility] Carter Keith Carter, Keith Lain, NP,  [address] [place] [address] [phone]  RESPONSIBLE PARTY:    Contact Information     Name Relation Home Work Mobile   Keith Carter Spouse [phone]     Keith Carter, Keith Carter Daughter [phone]  [phone]   Keith Carter   [phone]      I met face to face with patient and family in facility. Palliative Care was asked to follow this patient by consultation request of  Keith Carter  to address advance care planning and complex medical decision making. This is a follow up visit.                                   ASSESSMENT AND PLAN / RECOMMENDATIONS:  Symptom Management/Plan: 1. Advance Care Planning; Full code, continue discussions with daughter; pending completion of MOST form, emailed to daughter   2. Palliative care encounter; Palliative care encounter; Palliative medicine team will continue to support patient, patient's family, and medical team. Visit consisted of counseling and education dealing with the complex and emotionally intense issues of symptom management and palliative care in the setting of serious and potentially life-threatening illness   3. Debility secondary to dementia Encourage therapy though limited with disease progression, will continue transition to LTC, fall risk   4. Weight gain, continue to monitor weight; discussed nutrition, education done; may consider dietitian consult    11/10/2020 weight 205.3 lbs 01/05/2021 weight 215.9 lbs 02/19/2021 weight 230.8 lbs 04/15/2021 weight 232.6 lbs 05/2021 weight 230.4 lbs 5. f/u 8 weeks for ongoing  monitoring chronic disease progression, ongoing discussions complex medical decision making   Follow up Palliative Care Visit: Palliative care will continue to follow for complex medical decision making, advance care planning, and clarification of goals. Return 8 weeks or prn.   I spent 38 minutes providing this consultation starting at 11:00am. More than 50% of the time in this consultation was spent in counseling and care coordination.   PPS: 50%   Chief Complaint: Follow up palliative consult for complex medical decision making   HISTORY OF PRESENT ILLNESS:  My Keith Carter is a 66 y.o. year old male  with multiple medical problems including PE , dementia with delirium, FTT, BPH, HTN, DM, TIA. Mr. Keith Carter is followed by chronic pain with Dr Lowella Dandy. Mr. Keith Carter resides at [facility] Mr. Keith Carter requires assistance with transfers, mobility to w/c where he is mobile propeling with his feet around the facility. Requires assistance with bathing, dressing. Mr. Keith Carter feeds himself with good appetite, continues weight gain. No recent falls, wounds, infections, further hospitalizations. Mr. Keith Carter is a full code currently with ongoing discussion with daughter about MOST form. At present, Mr. Keith Carter is sitting in the w/c in his room with his wife. We talked about how he has been feeling. We talked about medical goals, quality of life, socialization. We talked about role pc in poc. Mr Keith Carter endorses he is doing well, ambulating with assistance with staff in the gym, walked 20 feet. We talked about Mr and Mrs Keith Carter being room-mates and it is working  out well.  We talked about ros, symptoms, appetite with weight gain, nutrition education. Therapeutic listening, emotional support provided. I called Mr. Keith Carter daughter Keith Carter leaving message to return call to update on PC visit. Updated staff, no new changes today. Praised Mr Keith Carter to continue self independence, working with therapy with motivation to  continue to wish to walk.   History obtained from review of EMR, discussion with facility staff and  Mr. Keith Carter sith Mrs Keith Carter I reviewed available labs, medications, imaging, studies and related documents from the EMR.  Records reviewed and summarized above.    ROS 10 point system reviewed with Mr Keith Carter and facility staff all negative except HPI   Physical Exam: Constitutional: NAD General: obese, debilitated pleasant male EYES:  lids intact ENMT: oral mucous membranes moist CV: S1S2, RRR Pulmonary: LCTA, no increased work of breathing, no cough, room air Abdomen:  soft and non tender MSK: w/c dependent, able to propel with his feet; able to take steps with assistance Skin: warm and dry Neuro:  + generalized weakness,  no cognitive impairment Psych: non-anxious affect, A and O x 3  Thank you for the opportunity to participate in the care of Mr. Keith Carter.  The palliative care team will continue to follow. Please call our office at [phone] if we can be of additional assistance.   Shravan Keith Ihor Gully, NP

## 2021-07-05 ENCOUNTER — Ambulatory Visit: Payer: Medicare HMO | Attending: Pain Medicine | Admitting: Pain Medicine

## 2021-07-05 DIAGNOSIS — M5137 Other intervertebral disc degeneration, lumbosacral region: Secondary | ICD-10-CM | POA: Diagnosis not present

## 2021-07-05 DIAGNOSIS — M4316 Spondylolisthesis, lumbar region: Secondary | ICD-10-CM | POA: Diagnosis not present

## 2021-07-05 DIAGNOSIS — M47816 Spondylosis without myelopathy or radiculopathy, lumbar region: Secondary | ICD-10-CM

## 2021-07-05 DIAGNOSIS — G8929 Other chronic pain: Secondary | ICD-10-CM

## 2021-07-05 DIAGNOSIS — M47817 Spondylosis without myelopathy or radiculopathy, lumbosacral region: Secondary | ICD-10-CM | POA: Diagnosis not present

## 2021-07-05 DIAGNOSIS — M961 Postlaminectomy syndrome, not elsewhere classified: Secondary | ICD-10-CM

## 2021-07-05 DIAGNOSIS — M4310 Spondylolisthesis, site unspecified: Secondary | ICD-10-CM | POA: Diagnosis not present

## 2021-07-05 DIAGNOSIS — M545 Low back pain, unspecified: Secondary | ICD-10-CM

## 2021-07-05 NOTE — Progress Notes (Signed)
Patient: Keith Carter  Service Category: E/M  Provider: Gaspar Cola, MD  DOB: 03/15/1955  DOS: 07/05/2021  Location: Office  MRN: [id number]  Setting: Ambulatory outpatient  Referring Provider: [name], *  Type: Established Patient  Specialty: Interventional Pain Management  PCP: [name], NP  Location: Remote location  Delivery: TeleHealth     Virtual Encounter - Pain Management PROVIDER NOTE: Information contained herein reflects review and annotations entered in association with encounter. Interpretation of such information and data should be left to medically-trained personnel. Information provided to patient can be located elsewhere in the medical record under "Patient Instructions". Document created using STT-dictation technology, any transcriptional errors that may result from process are unintentional.    Contact & Pharmacy Preferred: [phone] [facility]) Home: [phone] (home) Mobile: [phone] (mobile) E-mail: [email or web address]  [place] [place] [address] Phone: [phone] Fax: [phone]   Pre-screening  Keith Carter offered "in-person" vs "virtual" encounter. He indicated preferring virtual for this encounter.   Reason COVID-19*  Social distancing based on CDC and AMA recommendations.   I contacted Keith Carter on 07/05/2021 via telephone.      I clearly identified myself as Gaspar Cola, MD. I verified that I was speaking with the correct person using two identifiers (Name: Keith Carter, and date of birth: [birth year]).  Consent I sought verbal advanced consent from [place] for virtual visit interactions. I informed Mr. Garringer of possible security and privacy concerns, risks, and limitations associated with providing "not-in-person" medical evaluation and management services. I also informed Mr. Betsch of the availability of "in-person"  appointments. Finally, I informed him that there would be a charge for the virtual visit and that he could be  personally, fully or partially, financially responsible for it. Mr. Venn expressed understanding and agreed to proceed.   Historic Elements   Keith Carter is a 66 y.o. year old, male patient evaluated today after our last contact on 06/10/2021. Keith Carter  has a past medical history of Anxiety, Dyslipidemia, Enlarged prostate, Hypertension, Prediabetes, and TIA (transient ischemic attack). He also  has a past surgical history that includes Back surgery; Hernia repair; and PULMONARY THROMBECTOMY (N/A, 10/08/2020). Keith Carter has a current medication list which includes the following prescription(s): acetaminophen, amlodipine, apixaban, apixaban, atorvastatin, cholecalciferol, clopidogrel, duloxetine, lisinopril, metformin, pantoprazole, paroxetine, potassium, senna-docusate, sertraline, sildenafil, sitagliptin, and tamsulosin. He  reports that he has never smoked. He has never used smokeless tobacco. He reports current alcohol use. He reports that he does not use drugs. Keith Carter has No Known Allergies.   HPI  Today, he is being contacted for a post-procedure assessment.  The patient indicates having attained 100% relief of the pain for the duration of the local anesthetic, followed by a decrease to an ongoing 75% improvement of his low back pain.  He indicates that for the time being taking a couple Tylenols takes care of the pain and therefore nothing else is needed at this time.  The patient was encouraged to give [place] a call when the pain returns.  He understood and accepted.  Post-procedure evaluation   Type: Lumbar Facet, Medial Branch Block(s) #3  Laterality: Bilateral  Level: T12, L1, & L2 Medial Branch Level(s). Imaging: Fluoroscopic guidance Anesthesia: Local anesthesia (1-2% Lidocaine) Anxiolysis: IV Versed 2.0 mg Sedation: None. DOS: 06/10/2021 Performed by: Gaspar Cola,  MD  Primary Purpose: Diagnostic/Therapeutic Indications:  Low back pain severe enough to impact quality of life or function. 1. Lumbar facet joint syndrome (Bilateral)   2. Spondylosis without myelopathy or radiculopathy, lumbosacral region   3. Lumbosacral facet hypertrophy (Multilevel) (Bilateral)   4. Lumbar Grade 1 Anterolisthesis of L1/L2 (2 mm anterior translation w/ flexion)   5. Lumbar Grade 1 Retrolisthesis of L2/L3 and L3/L4   6. DDD (degenerative disc disease), lumbosacral   7. Chronic low back pain (1ry area of Pain) (Bilateral) w/o sciatica   8. Chronic anticoagulation (PLAVIX)    NAS-11 Pain score:   Pre-procedure: 10-Worst pain ever/10   Post-procedure: 0-No pain/10     Effectiveness:  Initial hour after procedure:   100%. Subsequent 4-6 hours post-procedure:   100%. Analgesia past initial 6 hours:   75%. Ongoing improvement:  Analgesic: The patient indicates currently having an ongoing 75% improvement of his low back pain. Function: Keith Carter reports improvement in function ROM: Keith Carter reports improvement in ROM  Pharmacotherapy Assessment   Opioid Analgesic: No chronic opioid analgesics therapy prescribed by our practice. None MME/day: 0 mg/day   Monitoring: [place] PMP: PDMP reviewed during this encounter.       Pharmacotherapy: No side-effects or adverse reactions reported. Compliance: No problems identified. Effectiveness: Clinically acceptable. Plan: Refer to "POC". UDS:  Summary  Date Value Ref Range Status  03/16/2020 FINAL  Final    Comment:    ==================================================================== TOXASSURE COMP DRUG ANALYSIS,UR ==================================================================== Test                             Result       Flag       Units Drug Present and Declared for Prescription Verification   Paroxetine                     PRESENT      EXPECTED  Drug Present not Declared for Prescription Verification    Ibuprofen                      PRESENT      UNEXPECTED  Drug Absent but Declared for Prescription Verification   Acetaminophen                  Not Detected UNEXPECTED    Acetaminophen, as indicated in the declared medication list, is    not always detected even when used as directed.    Salicylate                     Not Detected UNEXPECTED    Aspirin, as indicated in the declared medication list, is not    always detected even when used as directed.  ==================================================================== Test                      Result    Flag   Units      Ref Range   Creatinine              300              mg/dL      >=20 ==================================================================== Declared Medications:  The flagging and interpretation on this report are based on the  following declared medications.  Unexpected results may arise from  inaccuracies in the declared medications.   **Note: The testing scope of this panel includes these medications:   Paroxetine (Paxil)   **Note: The  testing scope of this panel does not include small to  moderate amounts of these reported medications:   Acetaminophen (Tylenol)  Aspirin (Aspirin 81)   **Note: The testing scope of this panel does not include following  reported medications:   Amlodipine (Norvasc)  Atorvastatin (Lipitor)  Celecoxib (Celebrex)  Cholecalciferol  Clopidogrel (Plavix)  Lisinopril (Zestril)  Metformin  Potassium  Sildenafil (Revatio)  Tamsulosin (Flomax) ==================================================================== For clinical consultation, please call [phone]. ====================================================================      Laboratory Chemistry Profile   Renal Lab Results  Component Value Date   BUN 19 10/12/2020   CREATININE 1.17 10/12/2020   BCR 17 03/16/2020   GFRAA 78 03/16/2020   GFRNONAA >60 10/12/2020    Hepatic Lab Results  Component Value  Date   AST 16 10/12/2020   ALT 12 10/12/2020   ALBUMIN 2.8 (L) 10/12/2020   ALKPHOS 79 10/12/2020    Electrolytes Lab Results  Component Value Date   NA 134 (L) 10/12/2020   K 3.5 10/12/2020   CL 102 10/12/2020   CALCIUM 8.7 (L) 10/12/2020   MG 1.8 10/11/2020   PHOS 3.4 10/02/2019    Bone Lab Results  Component Value Date   25OHVITD1 28 (L) 03/16/2020   25OHVITD2 <1.0 03/16/2020   25OHVITD3 28 03/16/2020    Inflammation (CRP: Acute Phase) (ESR: Chronic Phase) Lab Results  Component Value Date   CRP 1 03/16/2020   ESRSEDRATE 8 03/16/2020   LATICACIDVEN 1.6 10/05/2020         Note: Above Lab results reviewed.  Imaging  DG PAIN CLINIC C-ARM 1-60 MIN NO REPORT Fluoro was used, but no Radiologist interpretation will be provided.  Please refer to "NOTES" tab for provider progress note.  Assessment  The primary encounter diagnosis was Chronic low back pain (1ry area of Pain) (Bilateral) w/o sciatica. Diagnoses of Lumbar facet joint syndrome (Bilateral), Lumbar Grade 1 Anterolisthesis of L1/L2 (2 mm anterior translation w/ flexion), Lumbar Grade 1 Retrolisthesis of L2/L3 and L3/L4, Spondylosis without myelopathy or radiculopathy, lumbosacral region, Lumbosacral facet hypertrophy (Multilevel) (Bilateral), DDD (degenerative disc disease), lumbosacral, and Failed back surgical syndrome were also pertinent to this visit.  Plan of Care  Problem-specific:  No problem-specific Assessment & Plan notes found for this encounter.  Keith Carter has a current medication list which includes the following long-term medication(s): amlodipine, apixaban, apixaban, atorvastatin, duloxetine, lisinopril, metformin, pantoprazole, paroxetine, potassium, sertraline, and sitagliptin.  Pharmacotherapy (Medications Ordered): No orders of the defined types were placed in this encounter.  Orders:  No orders of the defined types were placed in this encounter.  Follow-up plan:   Return if  symptoms worsen or fail to improve.     Interventional Therapies  Risk  Complexity Considerations:   NOTE:  Plavix Anticoagulation (Stop: 7 days  Restart: 2 hours)       Planned  Pending:      Under consideration:      Completed:   Diagnostic bilateral L1-2 Lumbar facet MBB (T12, L1, L2) x3 (08/18/2020) (7-0) (100/100/75/75)    Therapeutic  Palliative (PRN) options:   None established    Recent Visits Date Type Provider Dept  06/10/21 Procedure visit Milinda Pointer, MD [facility]  05/26/21 Office Visit Milinda Pointer, MD [facility]  Showing recent visits within past 90 days and meeting all other requirements Today's Visits Date Type Provider Dept  07/05/21 Office Visit Milinda Pointer, MD [facility]  Showing today's visits and meeting all other requirements Future Appointments  No visits were found meeting these conditions. Showing future appointments within next 90 days and meeting all other requirements  I discussed the assessment and treatment plan with the patient. The patient was provided an opportunity to ask questions and all were answered. The patient agreed with the plan and demonstrated an understanding of the instructions.  Patient advised to call back or seek an in-person evaluation if the symptoms or condition worsens.  Duration of encounter: 12 minutes.  Note by: Gaspar Cola, MD Date: 07/05/2021; Time: 12:28 PM

## 2021-07-09 DIAGNOSIS — M6281 Muscle weakness (generalized): Secondary | ICD-10-CM | POA: Diagnosis not present

## 2021-07-09 DIAGNOSIS — W19XXXA Unspecified fall, initial encounter: Secondary | ICD-10-CM | POA: Diagnosis not present

## 2021-07-12 DIAGNOSIS — M6281 Muscle weakness (generalized): Secondary | ICD-10-CM | POA: Diagnosis not present

## 2021-07-12 DIAGNOSIS — F03918 Unspecified dementia, unspecified severity, with other behavioral disturbance: Secondary | ICD-10-CM | POA: Diagnosis not present

## 2021-07-12 DIAGNOSIS — I10 Essential (primary) hypertension: Secondary | ICD-10-CM | POA: Diagnosis not present

## 2021-07-13 DIAGNOSIS — D649 Anemia, unspecified: Secondary | ICD-10-CM | POA: Diagnosis not present

## 2021-07-13 DIAGNOSIS — R799 Abnormal finding of blood chemistry, unspecified: Secondary | ICD-10-CM | POA: Diagnosis not present

## 2021-07-13 DIAGNOSIS — N182 Chronic kidney disease, stage 2 (mild): Secondary | ICD-10-CM | POA: Diagnosis not present

## 2021-07-13 DIAGNOSIS — R7989 Other specified abnormal findings of blood chemistry: Secondary | ICD-10-CM | POA: Diagnosis not present

## 2021-07-29 DIAGNOSIS — G300 Alzheimer's disease with early onset: Secondary | ICD-10-CM | POA: Diagnosis not present

## 2021-07-29 DIAGNOSIS — F331 Major depressive disorder, recurrent, moderate: Secondary | ICD-10-CM | POA: Diagnosis not present

## 2021-07-29 DIAGNOSIS — F411 Generalized anxiety disorder: Secondary | ICD-10-CM | POA: Diagnosis not present

## 2021-08-05 DIAGNOSIS — K5900 Constipation, unspecified: Secondary | ICD-10-CM | POA: Diagnosis not present

## 2021-08-19 ENCOUNTER — Emergency Department: Payer: Medicare HMO

## 2021-08-19 ENCOUNTER — Encounter: Payer: Self-pay | Admitting: Emergency Medicine

## 2021-08-19 ENCOUNTER — Other Ambulatory Visit: Payer: Self-pay

## 2021-08-19 ENCOUNTER — Emergency Department
Admission: EM | Admit: 2021-08-19 | Discharge: 2021-08-19 | Disposition: A | Discharge status: Skilled Nursing Facility | Payer: Medicare HMO | Attending: Emergency Medicine | Admitting: Emergency Medicine

## 2021-08-19 DIAGNOSIS — R29810 Facial weakness: Secondary | ICD-10-CM | POA: Diagnosis not present

## 2021-08-19 DIAGNOSIS — F0390 Unspecified dementia without behavioral disturbance: Secondary | ICD-10-CM | POA: Diagnosis not present

## 2021-08-19 DIAGNOSIS — I6522 Occlusion and stenosis of left carotid artery: Secondary | ICD-10-CM | POA: Diagnosis not present

## 2021-08-19 DIAGNOSIS — E119 Type 2 diabetes mellitus without complications: Secondary | ICD-10-CM | POA: Insufficient documentation

## 2021-08-19 DIAGNOSIS — R0902 Hypoxemia: Secondary | ICD-10-CM | POA: Diagnosis not present

## 2021-08-19 DIAGNOSIS — I6501 Occlusion and stenosis of right vertebral artery: Secondary | ICD-10-CM | POA: Diagnosis not present

## 2021-08-19 DIAGNOSIS — Z7901 Long term (current) use of anticoagulants: Secondary | ICD-10-CM | POA: Insufficient documentation

## 2021-08-19 DIAGNOSIS — Z7902 Long term (current) use of antithrombotics/antiplatelets: Secondary | ICD-10-CM | POA: Diagnosis not present

## 2021-08-19 DIAGNOSIS — R4182 Altered mental status, unspecified: Secondary | ICD-10-CM | POA: Diagnosis not present

## 2021-08-19 DIAGNOSIS — Z7401 Bed confinement status: Secondary | ICD-10-CM | POA: Diagnosis not present

## 2021-08-19 DIAGNOSIS — R4781 Slurred speech: Secondary | ICD-10-CM | POA: Diagnosis not present

## 2021-08-19 DIAGNOSIS — I10 Essential (primary) hypertension: Secondary | ICD-10-CM | POA: Insufficient documentation

## 2021-08-19 DIAGNOSIS — N390 Urinary tract infection, site not specified: Secondary | ICD-10-CM | POA: Diagnosis not present

## 2021-08-19 DIAGNOSIS — R410 Disorientation, unspecified: Secondary | ICD-10-CM | POA: Diagnosis not present

## 2021-08-19 DIAGNOSIS — R404 Transient alteration of awareness: Secondary | ICD-10-CM | POA: Diagnosis not present

## 2021-08-19 LAB — CBC WITH DIFFERENTIAL/PLATELET
Abs Immature Granulocytes: 0.04 10*3/uL (ref 0.00–0.07)
Basophils Absolute: 0.1 10*3/uL (ref 0.0–0.1)
Basophils Relative: 1 %
Eosinophils Absolute: 0.1 10*3/uL (ref 0.0–0.5)
Eosinophils Relative: 2 %
HCT: 40.7 % (ref 39.0–52.0)
Hemoglobin: 12.7 g/dL — ABNORMAL LOW (ref 13.0–17.0)
Immature Granulocytes: 1 %
Lymphocytes Relative: 13 %
Lymphs Abs: 1 10*3/uL (ref 0.7–4.0)
MCH: 28.2 pg (ref 26.0–34.0)
MCHC: 31.2 g/dL (ref 30.0–36.0)
MCV: 90.4 fL (ref 80.0–100.0)
Monocytes Absolute: 0.6 10*3/uL (ref 0.1–1.0)
Monocytes Relative: 8 %
Neutro Abs: 6 10*3/uL (ref 1.7–7.7)
Neutrophils Relative %: 75 %
Platelets: 216 10*3/uL (ref 150–400)
RBC: 4.5 MIL/uL (ref 4.22–5.81)
RDW: 14.5 % (ref 11.5–15.5)
WBC: 7.9 10*3/uL (ref 4.0–10.5)
nRBC: 0 % (ref 0.0–0.2)

## 2021-08-19 LAB — URINALYSIS, ROUTINE W REFLEX MICROSCOPIC
Bilirubin Urine: NEGATIVE
Glucose, UA: NEGATIVE mg/dL
Hgb urine dipstick: NEGATIVE
Ketones, ur: NEGATIVE mg/dL
Nitrite: NEGATIVE
Protein, ur: NEGATIVE mg/dL
Specific Gravity, Urine: 1.015 (ref 1.005–1.030)
Squamous Epithelial / HPF: NONE SEEN (ref 0–5)
WBC, UA: >50 WBC/hpf — ABNORMAL HIGH (ref 0–5)
pH: 7 (ref 5.0–8.0)

## 2021-08-19 LAB — COMPREHENSIVE METABOLIC PANEL
ALT: 6 U/L (ref 0–44)
AST: 17 U/L (ref 15–41)
Albumin: 3.2 g/dL — ABNORMAL LOW (ref 3.5–5.0)
Alkaline Phosphatase: 72 U/L (ref 38–126)
Anion gap: 6 (ref 5–15)
BUN: 27 mg/dL — ABNORMAL HIGH (ref 8–23)
CO2: 26 mmol/L (ref 22–32)
Calcium: 9.2 mg/dL (ref 8.9–10.3)
Chloride: 108 mmol/L (ref 98–111)
Creatinine, Ser: 1.43 mg/dL — ABNORMAL HIGH (ref 0.61–1.24)
GFR, Estimated: 54 mL/min — ABNORMAL LOW (ref >60)
Glucose, Bld: 149 mg/dL — ABNORMAL HIGH (ref 70–99)
Potassium: 4.2 mmol/L (ref 3.5–5.1)
Sodium: 140 mmol/L (ref 135–145)
Total Bilirubin: 0.7 mg/dL (ref 0.3–1.2)
Total Protein: 6 g/dL — ABNORMAL LOW (ref 6.5–8.1)

## 2021-08-19 LAB — LIPASE, BLOOD: Lipase: 31 U/L (ref 11–51)

## 2021-08-19 LAB — TROPONIN I (HIGH SENSITIVITY): Troponin I (High Sensitivity): 11 ng/L (ref <18)

## 2021-08-19 MED ORDER — SODIUM CHLORIDE 0.9 % IV SOLN
1.0000 g | Freq: Once | INTRAVENOUS | Status: AC
  Administered 2021-08-19: 1 g via INTRAVENOUS
  Filled 2021-08-19: qty 10

## 2021-08-19 MED ORDER — CEFDINIR 300 MG PO CAPS: 300.0000 mg | ORAL_CAPSULE | Freq: Two times a day (BID) | ORAL | 0 refills | Status: AC

## 2021-08-19 MED ORDER — IOHEXOL 350 MG/ML SOLN
75.0000 mL | Freq: Once | INTRAVENOUS | Status: AC | PRN
  Administered 2021-08-19: 75 mL via INTRAVENOUS

## 2021-08-19 NOTE — ED Provider Notes (Signed)
[facility] Provider Note    Event Date/Time   First MD Initiated Contact with Patient 08/19/21 1534     (approximate)   History   Altered Mental Status   HPI  Keith Carter is a 66 y.o. male past medical history of PE on Eliquis dementia prediabetes, hypertension due to concern for difficulty speaking.  Has history of TIA/stroke 04/2019 acute left posterior frontal infarct residual left facial droop dysarthria and aphasia.  Patient is not really sure why he is here.  He denies any change in his speech and is tingling weakness visual change or headache.  He denies any fevers chills shortness of breath abdominal pain nausea vomiting diarrhea.  Says that his wife was concerned he was having a stroke today.  I spoke with the patient's wife she says that around 8 AM he woke up having difficulty urinating.  He went back to sleep after he urinated and then woke up again and seemed to be somewhat confused and his speech was more slurred than his baseline.  This lasted for about 30 minutes and then his speech seemed to return to normal.  He is otherwise been at his baseline without any acute complaints.  He takes Plavix and Eliquis.    Past Medical History:  Diagnosis Date   Anxiety    Dyslipidemia    Enlarged prostate    Hypertension    Prediabetes    TIA (transient ischemic attack)     Patient Active Problem List   Diagnosis Date Noted   Failure to thrive in adult 10/11/2020   Hypertensive emergency 10/08/2020   Acute massive pulmonary embolism ([place]) 10/07/2020   Spondylosis without myelopathy or radiculopathy, lumbosacral region 08/18/2020   DDD (degenerative disc disease), lumbosacral 07/28/2020   Lumbar Grade 1 Anterolisthesis of L1/L2 (2 mm anterior translation w/ flexion) 04/22/2020   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) 04/22/2020   Abnormal MRI, lumbar spine (01/10/2017) 03/16/2020   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    Lumbosacral facet hypertrophy (Multilevel) (Bilateral) 03/16/2020   Lumbar facet joint syndrome (Bilateral) 03/16/2020   Osteoarthritis of facet joint of lumbar spine 03/16/2020   Lumbar lateral recess stenosis (Multilevel) (Bilateral) 03/16/2020   Lumbosacral foraminal stenosis (Multilevel) (Bilateral) 03/16/2020   Lumbar central spinal stenosis, 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) 03/16/2020   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   Dextroscoliosis of lumbar spine 03/16/2020   Lumbar Grade 1 Retrolisthesis of L2/L3 and L3/L4 03/16/2020   Abnormal CT scan, lumbar spine (01/27/2020) 03/16/2020   Elevated hemoglobin A1c 03/16/2020   Chronic anticoagulation (PLAVIX) 03/16/2020   Anxiety 03/15/2020   Arthritis 03/15/2020   Erectile dysfunction 03/15/2020   Hypertension 03/15/2020   Chronic pain syndrome 03/15/2020   Pharmacologic therapy 03/15/2020   Disorder of skeletal system 03/15/2020   Problems influencing health status 03/15/2020   Expressive aphasia 10/08/2019   TIA (transient ischemic attack) 09/29/2019   Aphasia 09/28/2019   History of stroke 05/09/2019   Acute CVA (cerebrovascular accident) ([place]) 04/05/2019   Dysarthria    Type 2 diabetes mellitus with hyperlipidemia (HCC)    Essential hypertension    Obesity (BMI 30-39.9)    Benign prostatic hyperplasia without lower urinary tract symptoms    Stroke ([place]) 04/04/2019   Controlled type 2 diabetes mellitus without complication, without long-term current use of insulin ([place]) 09/06/2017   Pain of both elbows 04/14/2015   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[place]) 09/16/2014   Chronic low back pain (  1ry area of Pain) (Bilateral) w/o sciatica 11/27/2013     Physical Exam  Triage Vital Signs: ED Triage Vitals  Enc Vitals Group     BP 08/19/21 1247 131/85     Pulse Rate 08/19/21 1247 62     Resp 08/19/21 1247 18     Temp 08/19/21 1247 98 F (36.7 C)     Temp Source 08/19/21 1247 Oral     SpO2  08/19/21 1247 96 %     Weight 08/19/21 1248 230 lb (104.3 kg)     Height 08/19/21 1248 6' (1.829 m)     Head Circumference --      Peak Flow --      Pain Score 08/19/21 1246 6     Pain Loc --      Pain Edu? --      Excl. in [place]? --     Most recent vital signs: Vitals:   08/19/21 2000 08/19/21 2057  BP: (!) 181/91 (!) 138/97  Pulse: 77 71  Resp: 12 17  Temp:  97.9 F (36.6 C)  SpO2: 99% 98%     General: Awake, no distress.  Chronically ill-appearing CV:  Good peripheral perfusion.  Resp:  Normal effort.  Abd:  No distention.  Neuro:             Awake, oriented to person only Other:  Patient has aphasia Aox3, nml speech  PERRL, EOMI, face symmetric, nml tongue movement  5/5 strength in the BL upper and lower extremities  Sensation grossly intact in the BL upper and lower extremities  Finger-nose-finger intact BL    ED Results / Procedures / Treatments  Labs (all labs ordered are listed, but only abnormal results are displayed) Labs Reviewed  CBC WITH DIFFERENTIAL/PLATELET - Abnormal; Notable for the following components:      Result Value   Hemoglobin 12.7 (*)    All other components within normal limits  COMPREHENSIVE METABOLIC PANEL - Abnormal; Notable for the following components:   Glucose, Bld 149 (*)    BUN 27 (*)    Creatinine, Ser 1.43 (*)    Total Protein 6.0 (*)    Albumin 3.2 (*)    GFR, Estimated 54 (*)    All other components within normal limits  URINALYSIS, ROUTINE W REFLEX MICROSCOPIC - Abnormal; Notable for the following components:   Color, Urine YELLOW (*)    APPearance CLOUDY (*)    Leukocytes,Ua LARGE (*)    WBC, UA >50 (*)    Bacteria, UA FEW (*)    All other components within normal limits  URINE CULTURE  LIPASE, BLOOD  TROPONIN I (HIGH SENSITIVITY)     EKG  EKG reviewed interpreted by myself shows normal sinus rhythm with right bundle branch block and left posterior fascicular block no acute ischemic changes   RADIOLOGY I  reviewed and interpreted the CT scan of the brain which does not show any acute intracranial process    PROCEDURES:  Critical Care performed: No  Procedures  The patient is on the cardiac monitor to evaluate for evidence of arrhythmia and/or significant heart rate changes.   MEDICATIONS ORDERED IN ED: Medications  iohexol (OMNIPAQUE) 350 MG/ML injection 75 mL (75 mLs Intravenous Contrast Given 08/19/21 1730)  cefTRIAXone (ROCEPHIN) 1 g in sodium chloride 0.9 % 100 mL IVPB (0 g Intravenous Stopped 08/19/21 1902)     IMPRESSION / MDM / ASSESSMENT AND PLAN / ED COURSE  I reviewed the triage vital signs and  the nursing notes.                              Patient's presentation is most consistent with acute presentation with potential threat to life or bodily function.  Differential diagnosis includes, but is not limited to, CVA, TIA, seizure, metabolic encephalopathy, infection such as UTI pneumonia  Patient is a 66 year old male with history of prior left frontal stroke with residual aphasia.  Per neurology note from admission from September 2022 has baseline left-sided facial droop and aphasia as well as dysarthria has been followed for potential seizure but had negative EEGs.  His stroke was in March 2021.  Intermittent issues with memory since that time.  Today he had about 30 minutes of worsening slurred speech as well as confusion.  Prior to this he had had some difficulty urinating.  His wife notes that he did return to his baseline after a period of about 30 minutes.  On exam today he appears to be at his neurologic baseline he is dysarthric and aphasic I do not appreciate a facial droop and he has symmetric strength in the bilateral upper and lower extremities he is oriented to person unclear of which hospital we are in or the year.  His CT head is no acute findings.  Will obtain MRI and CT angio head and neck to further restratify for TIA.  Patient is medically optimized on Plavix  and Eliquis so if his work-up is otherwise reassuring I think that he would be appropriate for discharge with outpatient follow-up.  Patient's urine does have greater than 50 WBCs and with the urinary symptoms today this could have been causing his confusion.  We will treat with IV Rocephin and if able to be discharged can transition to p.o. antibiotics.  MRI shows no acute findings and has no significant stenosis.  Taking Ativan I think that suspicion for TIA is overall well and again patient is already medically optimized.  Will treat with 7-day course of cefdinir.       FINAL CLINICAL IMPRESSION(S) / ED DIAGNOSES   Final diagnoses:  Altered mental status, unspecified altered mental status type  Urinary tract infection without hematuria, site unspecified     Rx / DC Orders   ED Discharge Orders          Ordered    cefdinir (OMNICEF) 300 MG capsule  2 times daily        08/19/21 2013             Note:  This document was prepared using Dragon voice recognition software and may include unintentional dictation errors.   Rada Hay, MD 08/20/21 [phone]

## 2021-08-19 NOTE — ED Triage Notes (Signed)
Pt comes into the ED via EMS from [facility] with c/o waking up with slurred speech worse then normal around 4am, pt has a hx of stroke with slurred speech left sided facial droop, states now the speech is normal. Strong urine odor Hx of dementia, UTI  [id number] 153/113 98%RA HR66

## 2021-08-19 NOTE — ED Notes (Signed)
Attempted to call [facility] to give report on pt going back with no success. Will attempt later

## 2021-08-19 NOTE — ED Notes (Addendum)
Patient manually moved from recliner to stretcher by multiple members of staff, patient yelling and cussing at staff after moving. Stating he wants to "get out of here" agreeable to be taken to CT.

## 2021-08-19 NOTE — ED Notes (Signed)
Attempted to contact patient's wife via phone number in chart and on facility paperwork, unable to contact. Phone number no longer in service.

## 2021-08-19 NOTE — Discharge Instructions (Signed)
Your MRI and CAT scans were reassuring that you did not have a stroke.  Your urine does show that you likely have a urinary tract infection.  Please take the antibiotic twice a day for the next 7 days.  If you have fever or recurrence of your altered mental status or unable to eat or drink please return to the emergency department.

## 2021-08-19 NOTE — ED Provider Triage Note (Signed)
Emergency Medicine Provider Triage Evaluation Note  Keith Carter , a 66 y.o. male  was evaluated in triage.  With history of prior CVA currently anticoagulated with Eliquis, type 2 diabetes, hypertension, presents to the emergency department with concern for left-sided facial droop and worsening slurred speech.  No witnessed falls.  Review of Systems  Positive: Patient has slurred speech. Negative: No chest pain or abdominal pain.  Physical Exam  There were no vitals taken for this visit. Gen:   Awake, no distress   Resp:  Normal effort  MSK:   Moves extremities without difficulty  Other:    Medical Decision Making  Medically screening exam initiated at 12:46 PM.  Appropriate orders placed.  Keith Carter was informed that the remainder of the evaluation will be completed by another provider, this initial triage assessment does not replace that evaluation, and the importance of remaining in the ED until their evaluation is complete.     Keith Carter, [place] 08/19/21 1248

## 2021-08-19 NOTE — ED Notes (Signed)
Patient transported to MRI 

## 2021-08-19 NOTE — ED Notes (Signed)
Patient provided with urinal.

## 2021-08-19 NOTE — ED Notes (Signed)
Called EMS for transport back to [facility] 

## 2021-08-21 LAB — URINE CULTURE: Culture: >=100000 — AB

## 2021-09-02 DIAGNOSIS — F411 Generalized anxiety disorder: Secondary | ICD-10-CM | POA: Diagnosis not present

## 2021-09-02 DIAGNOSIS — F331 Major depressive disorder, recurrent, moderate: Secondary | ICD-10-CM | POA: Diagnosis not present

## 2021-09-02 DIAGNOSIS — G300 Alzheimer's disease with early onset: Secondary | ICD-10-CM | POA: Diagnosis not present

## 2021-09-07 DIAGNOSIS — I739 Peripheral vascular disease, unspecified: Secondary | ICD-10-CM | POA: Diagnosis not present

## 2021-09-07 DIAGNOSIS — L603 Nail dystrophy: Secondary | ICD-10-CM | POA: Diagnosis not present

## 2021-10-07 DIAGNOSIS — G300 Alzheimer's disease with early onset: Secondary | ICD-10-CM | POA: Diagnosis not present

## 2021-10-07 DIAGNOSIS — F331 Major depressive disorder, recurrent, moderate: Secondary | ICD-10-CM | POA: Diagnosis not present

## 2021-10-07 DIAGNOSIS — F411 Generalized anxiety disorder: Secondary | ICD-10-CM | POA: Diagnosis not present

## 2021-10-11 ENCOUNTER — Encounter: Payer: Self-pay | Admitting: Nurse Practitioner

## 2021-10-11 ENCOUNTER — Non-Acute Institutional Stay: Payer: Medicare HMO | Admitting: Nurse Practitioner

## 2021-10-11 VITALS — BP 148/88 | HR 76 | Temp 97.3°F | Resp 18 | Wt 236.3 lb

## 2021-10-11 DIAGNOSIS — F0390 Unspecified dementia without behavioral disturbance: Secondary | ICD-10-CM | POA: Diagnosis not present

## 2021-10-11 DIAGNOSIS — R5381 Other malaise: Secondary | ICD-10-CM

## 2021-10-11 DIAGNOSIS — Z515 Encounter for palliative care: Secondary | ICD-10-CM | POA: Diagnosis not present

## 2021-10-11 DIAGNOSIS — R635 Abnormal weight gain: Secondary | ICD-10-CM

## 2021-10-11 NOTE — Progress Notes (Signed)
[place] Consult Note Telephone: [phone]  Fax: [phone]    Date of encounter: 10/11/21 2:27 PM PATIENT NAME: Keith Carter [id number]   [phone] (home)  DOB: Feb 22, 1955 MRN: [id number] PRIMARY CARE PROVIDER:   [facility], Victoriano Lain, NP,  [address] [place] [address] [phone]  RESPONSIBLE PARTY:    Contact Information     Name Relation Home Work Mobile   [place] Spouse [phone]     Ezio, Wieck Daughter [phone]  [phone]   [place] Pandora Leiter   [phone]       I met face to face with patient and family in facility. Palliative Care was asked to follow this patient by consultation request of  [place]  to address advance care planning and complex medical decision making. This is a follow up visit.                                   ASSESSMENT AND PLAN / RECOMMENDATIONS:  Symptom Management/Plan: 1. Advance Care Planning; DNR; MOST form 2. Palliative care encounter; Palliative care encounter; Palliative medicine team will continue to support patient, patient's family, and medical team. Visit consisted of counseling and education dealing with the complex and emotionally intense issues of symptom management and palliative care in the setting of serious and potentially life-threatening illness   3. Debility secondary to CVA: dementia; aphasia, progressive, continue to monitor; encourage socialization, oob, he resides at [facility] in the same room as his wife. Overall stable. Continue to monitor for fall risk.    4. Weight gain, continue to monitor weight; discussed nutrition, education done; may consider dietitian consult    11/10/2020 weight 205.3 lbs 01/05/2021 weight 215.9 lbs 02/19/2021 weight 230.8 lbs 04/15/2021 weight 232.6 lbs 05/2021 weight 230.4 lbs 10/07/2021 weight 236.3 lbs 5. f/u 8  weeks for ongoing monitoring chronic disease progression, ongoing discussions complex medical decision making   Follow up Palliative Care Visit: Palliative care will continue to follow for complex medical decision making, advance care planning, and clarification of goals. Return 8 weeks or prn.   I spent 35 minutes providing this consultation starting at 1:00 pm. More than 50% of the time in this consultation was spent in counseling and care coordination.   PPS: 50%   Chief Complaint: Follow up palliative consult for complex medical decision making   HISTORY OF PRESENT ILLNESS:  Keith Carter is a 66 y.o. year old male  with multiple medical problems including PE/dementia with delirium, left frontal stroke with residual aphasia, FTT, BPH, HTN, DM, TIA. Keith Carter is followed by chronic pain with Dr Lowella Dandy. Keith Carter resides at [place] at [facility]. Keith Carter requires assistance with transfers, mobility to w/c where he is mobile propeling with his feet around the facility. Requires assistance with bathing, dressing. Keith Carter. Vosler feeds himself with good appetite, continues weight gain. Staff endorses Keith Carter did have a fall, no injury. ED visit for AMS; 08/19/2021 workup significant for UTI. Keith Carter was treated d/c back to [facility]. No other changes. Keith Carter Carter is currently sitting in the w/c in his room with his wife. Keith Carter and I talked about pc visit, ros, symptoms, how he has been feeling, appetite, ED visit, recent fall. We talked about safety and importance of asking for help. We talked about medical goals, quality  of life, residing at facility. No other changes, doing well overall. Therapeutic listening, emotional support provided. I called Keith Carter. [place] daughter [name] leaving message to return call to update on PC visit. Updated staff, no new changes today.  History obtained from review of EMR, discussion with facility staff and  Keith Carter. [name] sith [name] I reviewed available labs,  medications, imaging, studies and related documents from the EMR.  Records reviewed and summarized above.    ROS 10 point system reviewed with Keith Carter Spikes and facility staff all negative except HPI   Physical Exam: Constitutional: NAD General: obese, debilitated pleasant male EYES:  lids intact ENMT: oral mucous membranes moist CV: S1S2, RRR Pulmonary: LCTA, no increased work of breathing, no cough, room air Abdomen:  soft and non tender MSK: w/c dependent, able to propel with his feet; able to take steps with assistance Skin: warm and dry Neuro:  + generalized weakness,  + cognitive impairment, aphasia Psych: non-anxious affect, Alert, oriented to self, place    Thank you for the opportunity to participate in the care of Keith Carter. Honse.  The palliative care team will continue to follow. Please call our office at [phone] if we can be of additional assistance.   Alizza Sacra Ihor Gully, NP

## 2021-10-21 DIAGNOSIS — K5900 Constipation, unspecified: Secondary | ICD-10-CM | POA: Diagnosis not present

## 2021-10-26 DIAGNOSIS — E1169 Type 2 diabetes mellitus with other specified complication: Secondary | ICD-10-CM | POA: Diagnosis not present

## 2021-10-26 DIAGNOSIS — E119 Type 2 diabetes mellitus without complications: Secondary | ICD-10-CM | POA: Diagnosis not present

## 2021-10-26 DIAGNOSIS — E785 Hyperlipidemia, unspecified: Secondary | ICD-10-CM | POA: Diagnosis not present

## 2021-10-26 DIAGNOSIS — I10 Essential (primary) hypertension: Secondary | ICD-10-CM | POA: Diagnosis not present

## 2021-10-26 DIAGNOSIS — E113291 Type 2 diabetes mellitus with mild nonproliferative diabetic retinopathy without macular edema, right eye: Secondary | ICD-10-CM | POA: Diagnosis not present

## 2021-10-26 DIAGNOSIS — E46 Unspecified protein-calorie malnutrition: Secondary | ICD-10-CM | POA: Diagnosis not present

## 2021-10-27 DIAGNOSIS — E1169 Type 2 diabetes mellitus with other specified complication: Secondary | ICD-10-CM | POA: Diagnosis not present

## 2021-10-27 DIAGNOSIS — I110 Hypertensive heart disease with heart failure: Secondary | ICD-10-CM | POA: Diagnosis not present

## 2021-10-27 DIAGNOSIS — R627 Adult failure to thrive: Secondary | ICD-10-CM | POA: Diagnosis not present

## 2021-10-27 DIAGNOSIS — I10 Essential (primary) hypertension: Secondary | ICD-10-CM | POA: Diagnosis not present

## 2021-10-28 DIAGNOSIS — R799 Abnormal finding of blood chemistry, unspecified: Secondary | ICD-10-CM | POA: Diagnosis not present

## 2021-10-28 DIAGNOSIS — E870 Hyperosmolality and hypernatremia: Secondary | ICD-10-CM | POA: Diagnosis not present

## 2021-10-28 DIAGNOSIS — E119 Type 2 diabetes mellitus without complications: Secondary | ICD-10-CM | POA: Diagnosis not present

## 2021-10-28 DIAGNOSIS — R7989 Other specified abnormal findings of blood chemistry: Secondary | ICD-10-CM | POA: Diagnosis not present

## 2021-11-03 DIAGNOSIS — F4323 Adjustment disorder with mixed anxiety and depressed mood: Secondary | ICD-10-CM | POA: Diagnosis not present

## 2021-11-04 DIAGNOSIS — F0393 Unspecified dementia, unspecified severity, with mood disturbance: Secondary | ICD-10-CM | POA: Diagnosis not present

## 2021-11-04 DIAGNOSIS — F411 Generalized anxiety disorder: Secondary | ICD-10-CM | POA: Diagnosis not present

## 2021-11-08 DIAGNOSIS — Z7984 Long term (current) use of oral hypoglycemic drugs: Secondary | ICD-10-CM | POA: Diagnosis not present

## 2021-11-08 DIAGNOSIS — B351 Tinea unguium: Secondary | ICD-10-CM | POA: Diagnosis not present

## 2021-11-08 DIAGNOSIS — E1151 Type 2 diabetes mellitus with diabetic peripheral angiopathy without gangrene: Secondary | ICD-10-CM | POA: Diagnosis not present

## 2021-11-08 DIAGNOSIS — L603 Nail dystrophy: Secondary | ICD-10-CM | POA: Diagnosis not present

## 2021-11-10 DIAGNOSIS — F4323 Adjustment disorder with mixed anxiety and depressed mood: Secondary | ICD-10-CM | POA: Diagnosis not present

## 2021-11-19 DIAGNOSIS — N390 Urinary tract infection, site not specified: Secondary | ICD-10-CM | POA: Diagnosis not present

## 2021-11-19 DIAGNOSIS — N182 Chronic kidney disease, stage 2 (mild): Secondary | ICD-10-CM | POA: Diagnosis not present

## 2021-11-29 ENCOUNTER — Non-Acute Institutional Stay: Payer: Medicare HMO | Admitting: Nurse Practitioner

## 2021-11-29 ENCOUNTER — Encounter (INDEPENDENT_AMBULATORY_CARE_PROVIDER_SITE_OTHER): Payer: Self-pay

## 2021-11-29 ENCOUNTER — Encounter: Payer: Self-pay | Admitting: Nurse Practitioner

## 2021-11-29 VITALS — BP 155/88 | HR 76 | Temp 97.8°F | Resp 18 | Wt 235.4 lb

## 2021-11-29 DIAGNOSIS — R635 Abnormal weight gain: Secondary | ICD-10-CM

## 2021-11-29 DIAGNOSIS — R5381 Other malaise: Secondary | ICD-10-CM

## 2021-11-29 DIAGNOSIS — F0390 Unspecified dementia without behavioral disturbance: Secondary | ICD-10-CM | POA: Diagnosis not present

## 2021-11-29 DIAGNOSIS — Z515 Encounter for palliative care: Secondary | ICD-10-CM | POA: Diagnosis not present

## 2021-11-29 NOTE — Progress Notes (Signed)
[place] Consult Note Telephone: [phone]  Fax: [phone]    Date of encounter: 11/29/21 3:53 PM PATIENT NAME: Keith Carter [id number]   [phone] (home)  DOB: 1955/08/18 MRN: [id number] PRIMARY CARE PROVIDER:    [facility]  RESPONSIBLE PARTY:    Contact Information     Name Relation Home Work Mobile   [place] Spouse [phone]     Keith Carter Daughter [phone]  [phone]   Okey Dupre   [phone]     I met face to face with patient and family in facility. Palliative Care was asked to follow this patient by consultation request of  [place]  to address advance care planning and complex medical decision making. This is a follow up visit.                                   ASSESSMENT AND PLAN / RECOMMENDATIONS:  Symptom Management/Plan: 1. Advance Care Planning; DNR; MOST form 2. Palliative care encounter; Palliative care encounter; Palliative medicine team will continue to support patient, patient's family, and medical team. Visit consisted of counseling and education dealing with the complex and emotionally intense issues of symptom management and palliative care in the setting of serious and potentially life-threatening illness   3. Debility secondary to CVA: dementia; aphasia, progressive, continue to monitor; encourage socialization, oob, he resides at [facility] in the same room as his wife. Overall stable. Continue to monitor for fall risk.    4. Weight gain, reviewed weights, lost less than 1 lb. continue to monitor weight; discussed nutrition, daily intake. 30+ weight gain/12 months   11/10/2020 weight 205.3 lbs 01/05/2021 weight 215.9 lbs 02/19/2021 weight 230.8 lbs 04/15/2021 weight 232.6 lbs 05/2021 weight 230.4 lbs 10/07/2021 weight 236.3 lbs 11/04/2021 weight 235.4 lbs  10/27/2021 Sodium 135;  potassium 4.4; chloride 101; co2 24; calcium 9.2; bun 25.1; creatinine 1.25; glucose 182; albumin 3.7; total protein 6.3  5. f/u 8 weeks for ongoing monitoring chronic disease progression, ongoing discussions complex medical decision making   Follow up Palliative Care Visit: Palliative care will continue to follow for complex medical decision making, advance care planning, and clarification of goals. Return 8 weeks or prn.   I spent 37 minutes providing this consultation starting at 11:15 am. More than 50% of the time in this consultation was spent in counseling and care coordination.   PPS: 40%   Chief Complaint: Follow up palliative consult for complex medical decision making   HISTORY OF PRESENT ILLNESS:  Keith Carter is a 66 y.o. year old male  with multiple medical problems including PE/dementia with delirium, left frontal stroke with residual aphasia, FTT, BPH, HTN, DM, TIA. [name] is followed by chronic pain with Dr Lowella Dandy. [name] resides at [place] at [facility] with his wife as his roommate. [name] requires assistance with transfers, mobility to w/c where he is mobile propeling with his feet around the facility. Requires assistance with bathing, dressing. [name] does feed himself with good appetite per staff. Staff endorses no new changes. At present [name] is sitting in w/c in his room. [name] appears comfortable. No visitors present. I visited and observed [name], we talked about purpose of pc visit, [name] in agreement. We talked about how he has been feeling. [name] endorses overall he has  been doing well. He is concerned about his wife's health. We talked about his concerns, support provided. We talked about ros, appetite, weights, functional abilities, daily routine, importance of mobility, fall risk. We talked about residing at a facility. We talked about quality of life, coping strategies. [name] was cooperative. Reviewed Medical goals, poc,  attempted to contact [place], updated staff, no new changes to poc. Will continue to follow.   I reviewed available labs, medications, imaging, studies and related documents from the EMR.  Records reviewed and summarized above.    ROS 10 point system reviewed with [name] and facility staff all negative except HPI   Physical Exam: Constitutional: NAD General: obese, debilitated pleasant male EYES:  lids intact ENMT: oral mucous membranes moist CV: S1S2, RRR Pulmonary: LCTA, no increased work of breathing, no cough, room air Abdomen:  soft and non tender MSK: w/c dependent, able to propel with his feet; able to take steps with assistance Skin: warm and dry Neuro:  + generalized weakness,  + cognitive impairment, aphasia improving Psych: non-anxious affect, Alert, oriented to self, place  Thank you for the opportunity to participate in the care of Mr. Penton.  The palliative care team will continue to follow. Please call our office at [phone] if we can be of additional assistance.   Nishawn Rotan Ihor Gully, NP

## 2021-12-01 DIAGNOSIS — F4323 Adjustment disorder with mixed anxiety and depressed mood: Secondary | ICD-10-CM | POA: Diagnosis not present

## 2021-12-06 DIAGNOSIS — F411 Generalized anxiety disorder: Secondary | ICD-10-CM | POA: Diagnosis not present

## 2021-12-06 DIAGNOSIS — F0393 Unspecified dementia, unspecified severity, with mood disturbance: Secondary | ICD-10-CM | POA: Diagnosis not present

## 2021-12-07 DIAGNOSIS — I10 Essential (primary) hypertension: Secondary | ICD-10-CM | POA: Diagnosis not present

## 2021-12-22 DIAGNOSIS — F4323 Adjustment disorder with mixed anxiety and depressed mood: Secondary | ICD-10-CM | POA: Diagnosis not present

## 2022-01-05 DIAGNOSIS — F4323 Adjustment disorder with mixed anxiety and depressed mood: Secondary | ICD-10-CM | POA: Diagnosis not present

## 2022-01-09 DIAGNOSIS — F0393 Unspecified dementia, unspecified severity, with mood disturbance: Secondary | ICD-10-CM | POA: Diagnosis not present

## 2022-01-09 DIAGNOSIS — F4323 Adjustment disorder with mixed anxiety and depressed mood: Secondary | ICD-10-CM | POA: Diagnosis not present

## 2022-01-09 DIAGNOSIS — F411 Generalized anxiety disorder: Secondary | ICD-10-CM | POA: Diagnosis not present

## 2022-01-10 ENCOUNTER — Non-Acute Institutional Stay: Payer: Medicare HMO | Admitting: Nurse Practitioner

## 2022-01-10 VITALS — BP 128/77 | HR 70 | Temp 98.1°F | Resp 18 | Wt 240.0 lb

## 2022-01-10 DIAGNOSIS — F0390 Unspecified dementia without behavioral disturbance: Secondary | ICD-10-CM | POA: Diagnosis not present

## 2022-01-10 DIAGNOSIS — R5381 Other malaise: Secondary | ICD-10-CM | POA: Diagnosis not present

## 2022-01-10 DIAGNOSIS — E1151 Type 2 diabetes mellitus with diabetic peripheral angiopathy without gangrene: Secondary | ICD-10-CM | POA: Diagnosis not present

## 2022-01-10 DIAGNOSIS — R635 Abnormal weight gain: Secondary | ICD-10-CM | POA: Diagnosis not present

## 2022-01-10 DIAGNOSIS — Z515 Encounter for palliative care: Secondary | ICD-10-CM | POA: Diagnosis not present

## 2022-01-10 DIAGNOSIS — B351 Tinea unguium: Secondary | ICD-10-CM | POA: Diagnosis not present

## 2022-01-10 NOTE — Progress Notes (Signed)
[place] Consult Note Telephone: [phone]  Fax: [phone]    Date of encounter: 01/10/22 8:55 PM PATIENT NAME: Keith Carter [id number]   [phone] (home)  DOB: 1955-11-30 MRN: [id number] PRIMARY CARE PROVIDER:    [place] Carter:    Contact Information     Name Relation Home Work Mobile   Keith Carter Spouse [phone]     Keith Carter,Keith Carter [phone]  [phone]   Keith Carter,Keith Carter   [phone]           Keith Carter Note Telephone: [phone]  Fax: [phone]      Date of encounter: 11/29/21 3:53 PM PATIENT NAME: Keith Carter [id number]   [phone] (home)  DOB: [birth year] MRN: [id number] PRIMARY CARE PROVIDER:    [facility]   RESPONSIBLE PARTY:    Contact Information       Name Relation Home Work Mobile    Keith Carter Spouse [phone]        Keith Carter, Keith Carter Carter [phone]   [phone]    Keith Carter     [phone]       Keith Carter face to face with patient and family in facility. Palliative Care was asked to follow this patient by consultation request of  Keith Carter  to address advance care planning and complex medical decision making. This is a follow up visit.                                   ASSESSMENT AND PLAN / RECOMMENDATIONS:  Symptom Management/Plan: 1. Advance Care Planning; DNR; MOST form 2. Palliative care encounter; Palliative care encounter; Palliative medicine team will continue to support patient, patient's family, and medical team. Visit consisted of counseling and education dealing with the complex and emotionally intense issues of symptom management and palliative care in the setting of serious and potentially life-threatening  illness   3. Debility secondary to CVA: dementia; aphasia, progressive, continue to monitor; encourage socialization, oob, he resides at [facility] in the same room as his wife. Overall stable. Continue to monitor for fall risk.    4. Weight gain, reviewed weights, lost less than 1 lb. continue to monitor weight; discussed nutrition, daily intake   11/10/2020 weight 205.3 lbs 01/05/2021 weight 215.9 lbs 02/19/2021 weight 230.8 lbs 04/15/2021 weight 232.6 lbs 05/2021 weight 230.4 lbs 10/07/2021 weight 236.3 lbs 11/04/2021 weight 235.4 lbs 01/05/2022 weight 240 lbs 10/27/2021 Sodium 135; potassium 4.4; chloride 101; co2 24; calcium 9.2; bun 25.1; creatinine 1.25; glucose 182; albumin 3.7; total protein 6.3   5. f/u 8 weeks for ongoing monitoring chronic disease progression, ongoing discussions complex medical decision making   Follow up Palliative Care Visit: Palliative care will continue to follow for complex medical decision making, advance care planning, and clarification of goals. Return 8 weeks or prn.   Keith spent 35 minutes providing this consultation starting at 12:45pm. More than 50% of the time in this consultation was spent in counseling and care coordination.   PPS: 40%   Chief Complaint: Follow up palliative consult for complex medical decision making   HISTORY OF PRESENT ILLNESS:  Keith Carter is a 66 y.o. year old male  with multiple medical problems including PE/dementia with delirium, left frontal stroke with residual aphasia, FTT,  BPH, HTN, DM, TIA. Keith Carter is followed by chronic pain with Dr Lowella Dandy. Keith Carter resides at [place] at [facility] with his wife as his roommate. Keith Carter requires assistance with transfers, mobility to w/c where he is mobile propeling with his feet around the facility. Requires assistance with bathing, dressing. Mr Carter does feed himself with good appetite per staff. Staff endorses no new changes. At present Keith Carter is sitting in w/c in his  room. Keith Carter appears comfortable. No visitors present. Keith visited and observed Mr Carter, we talked about purpose of pc visit, Mr Carter in agreement. We talked about ros, functional debility. We talked about recent falls. Discussed no recent infections, hospitalizations, wounds. We talked about about appetite, foods Keith Carter enjoys. We talked about daily routine, activities. We talked about upcoming holidays, family dynamics. We talked about role pc in poc. Discussed and praised Mr Carter for being oob, mobile and increasing activity. Most PC visit supportive. Medical goals, medications, poc reviewed, no new changes recommended, will continue to f/u, monitor. Mr Carter was cooperative. Reviewed Medical goals, poc, attempted to contact [place], updated staff, no new changes to poc. Will continue to follow.    Keith reviewed available labs, medications, imaging, studies and related documents from the EMR.  Records reviewed and summarized above.    ROS 10 point system reviewed with Mr Carter and facility staff all negative except HPI   Physical Exam: Constitutional: NAD General: obese, debilitated pleasant male EYES:  lids intact ENMT: oral mucous membranes moist CV: S1S2, RRR Pulmonary: LCTA, no increased work of breathing, no cough, room air Abdomen:  soft and non tender MSK: w/c dependent, able to propel with his feet; able to take steps with assistance Skin: warm and dry Neuro:  + generalized weakness,  + cognitive impairment, aphasia improving Psych: non-anxious affect, Alert, oriented to self, place     Thank you for the opportunity to participate in the care of Keith Carter. Please call our office at [phone] if we can be of additional assistance.   Vyla Pint Ihor Gully, NP

## 2022-01-19 DIAGNOSIS — F4323 Adjustment disorder with mixed anxiety and depressed mood: Secondary | ICD-10-CM | POA: Diagnosis not present

## 2022-02-09 DIAGNOSIS — F4323 Adjustment disorder with mixed anxiety and depressed mood: Secondary | ICD-10-CM | POA: Diagnosis not present

## 2022-02-11 DIAGNOSIS — F419 Anxiety disorder, unspecified: Secondary | ICD-10-CM | POA: Diagnosis not present

## 2022-02-11 DIAGNOSIS — F0280 Dementia in other diseases classified elsewhere without behavioral disturbance: Secondary | ICD-10-CM | POA: Diagnosis not present

## 2022-02-11 DIAGNOSIS — G309 Alzheimer's disease, unspecified: Secondary | ICD-10-CM | POA: Diagnosis not present

## 2022-02-11 DIAGNOSIS — F331 Major depressive disorder, recurrent, moderate: Secondary | ICD-10-CM | POA: Diagnosis not present

## 2022-02-15 DIAGNOSIS — M545 Low back pain, unspecified: Secondary | ICD-10-CM | POA: Diagnosis not present

## 2022-02-15 DIAGNOSIS — M25511 Pain in right shoulder: Secondary | ICD-10-CM | POA: Diagnosis not present

## 2022-02-17 DIAGNOSIS — E46 Unspecified protein-calorie malnutrition: Secondary | ICD-10-CM | POA: Diagnosis not present

## 2022-02-17 DIAGNOSIS — E785 Hyperlipidemia, unspecified: Secondary | ICD-10-CM | POA: Diagnosis not present

## 2022-02-17 DIAGNOSIS — R627 Adult failure to thrive: Secondary | ICD-10-CM | POA: Diagnosis not present

## 2022-02-18 DIAGNOSIS — M545 Low back pain, unspecified: Secondary | ICD-10-CM | POA: Diagnosis not present

## 2022-02-21 DIAGNOSIS — M545 Low back pain, unspecified: Secondary | ICD-10-CM | POA: Diagnosis not present

## 2022-02-23 DIAGNOSIS — F4323 Adjustment disorder with mixed anxiety and depressed mood: Secondary | ICD-10-CM | POA: Diagnosis not present

## 2022-03-02 DIAGNOSIS — E1169 Type 2 diabetes mellitus with other specified complication: Secondary | ICD-10-CM | POA: Diagnosis not present

## 2022-03-02 DIAGNOSIS — E46 Unspecified protein-calorie malnutrition: Secondary | ICD-10-CM | POA: Diagnosis not present

## 2022-03-02 DIAGNOSIS — E785 Hyperlipidemia, unspecified: Secondary | ICD-10-CM | POA: Diagnosis not present

## 2022-03-02 DIAGNOSIS — E113291 Type 2 diabetes mellitus with mild nonproliferative diabetic retinopathy without macular edema, right eye: Secondary | ICD-10-CM | POA: Diagnosis not present

## 2022-03-04 ENCOUNTER — Encounter: Payer: Self-pay | Admitting: Nurse Practitioner

## 2022-03-04 ENCOUNTER — Non-Acute Institutional Stay: Payer: Medicare HMO | Admitting: Nurse Practitioner

## 2022-03-04 DIAGNOSIS — I63111 Cerebral infarction due to embolism of right vertebral artery: Secondary | ICD-10-CM | POA: Diagnosis not present

## 2022-03-04 DIAGNOSIS — F0390 Unspecified dementia without behavioral disturbance: Secondary | ICD-10-CM | POA: Diagnosis not present

## 2022-03-04 DIAGNOSIS — Z515 Encounter for palliative care: Secondary | ICD-10-CM

## 2022-03-04 DIAGNOSIS — R5381 Other malaise: Secondary | ICD-10-CM

## 2022-03-04 NOTE — Progress Notes (Signed)
[place] Consult Note Telephone: [phone]  Fax: [phone]    Date of encounter: 03/04/22 6:54 PM PATIENT NAME: Emmaline Life Mathews [id number]   [phone] (home)  DOB: 1955-11-13 MRN: [id number] PRIMARY CARE PROVIDER:    [facility]  RESPONSIBLE PARTY:    Contact Information     Name Relation Home Work Mobile   [place] Spouse [phone]     Nashawn, [place] Daughter [phone]  [phone]   Okey Dupre   [phone]        I met face to face with patient and family in facility. Palliative Care was asked to follow this patient by consultation request of  [place]  to address advance care planning and complex medical decision making. This is a follow up visit.                                   ASSESSMENT AND PLAN / RECOMMENDATIONS:  Symptom Management/Plan: 1. Advance Care Planning; DNR; MOST form 2. Palliative care encounter; Palliative care encounter; Palliative medicine team will continue to support patient, patient's family, and medical team. Visit consisted of counseling and education dealing with the complex and emotionally intense issues of symptom management and palliative care in the setting of serious and potentially life-threatening illness   3. Debility secondary to CVA: dementia; aphasia, progressive, continue to monitor; encourage socialization, oob, he resides at [facility] in the same room as his wife. Overall stable. Continue to monitor for fall risk.    4. Weight gain, reviewed weights, lost less than 1 lb. continue to monitor weight; discussed nutrition, daily intake   11/10/2020 weight 205.3 lbs 01/05/2021 weight 215.9 lbs 02/19/2021 weight 230.8 lbs 04/15/2021 weight 232.6 lbs 05/2021 weight 230.4 lbs 10/07/2021 weight 236.3 lbs 11/04/2021 weight 235.4 lbs 01/05/2022 weight 240 lbs 10/27/2021 Sodium 135;  potassium 4.4; chloride 101; co2 24; calcium 9.2; bun 25.1; creatinine 1.25; glucose 182; albumin 3.7; total protein 6.3   5. f/u 8 weeks for ongoing monitoring chronic disease progression, ongoing discussions complex medical decision making   Follow up Palliative Care Visit: Palliative care will continue to follow for complex medical decision making, advance care planning, and clarification of goals. Return 8 weeks or prn.   I spent 45 minutes providing this consultation starting at 12:45pm. More than 50% of the time in this consultation was spent in counseling and care coordination.   PPS: 40%   Chief Complaint: Follow up palliative consult for complex medical decision making   HISTORY OF PRESENT ILLNESS:  Janai Brannigan is a 67 y.o. year old male  with multiple medical problems including PE/dementia with delirium, left frontal stroke with residual aphasia, FTT, BPH, HTN, DM, TIA. Mr. Gosney is followed by chronic pain with Dr Lowella Dandy. Mr. Voorhees resides at [place] at [facility] with his wife as his roommate. Mr. Comella requires assistance with transfers, mobility to w/c where he is mobile propeling with his feet around the facility. Requires assistance with bathing, dressing. Mr Vasques does feed himself with good appetite per staff. Staff endorses no new changes. Purpose of today PC f/u visit further discussion monitor trends of appetite, weights, monitor for functional, cognitive decline with chronic disease progression, assess any active symptoms, supportive role. At present Mr. Winokur is sitting in w/c in his room. We talked about purpose of pc visit, ros,  functional abilities. Ms Eichinger was present. We talked about appetite, swallowing at length, food textures, education provided. We talked about the possibility of Mr Kucher moving to [place], another facility. We talked about PC does follow at [facility]. Medications, goc, poc reviewed. Mr Vitug is currently stable, attempted to contact [facility] for  update on pc visit. PC f/u visit further discussion monitor trends of appetite, weights, monitor for functional, cognitive decline with chronic disease progression, assess any active symptoms, supportive role. Updated staff, no new changes recommended today I reviewed available labs, medications, imaging, studies and related documents from the EMR.  Records reviewed and summarized above.  Physical Exam: Constitutional: NAD General: obese, debilitated pleasant male EYES:  lids intact ENMT: oral mucous membranes moist CV: S1S2, RRR Pulmonary: LCTA, no increased work of breathing, no cough, room air Abdomen:  soft and non tender MSK: w/c dependent, able to propel with his feet; able to take steps with assistance Skin: warm and dry Neuro:  + generalized weakness,  + cognitive impairment, aphasia improving Psych: non-anxious affect, Alert, oriented to self, place  Thank you for the opportunity to participate in the care of Mr. Deery. Please call our office at [phone] if we can be of additional assistance.   Ramonte Mena Ihor Gully, NP

## 2022-03-09 DIAGNOSIS — I2699 Other pulmonary embolism without acute cor pulmonale: Secondary | ICD-10-CM | POA: Diagnosis not present

## 2022-03-09 DIAGNOSIS — F4323 Adjustment disorder with mixed anxiety and depressed mood: Secondary | ICD-10-CM | POA: Diagnosis not present

## 2022-03-09 DIAGNOSIS — I10 Essential (primary) hypertension: Secondary | ICD-10-CM | POA: Diagnosis not present

## 2022-03-09 DIAGNOSIS — R627 Adult failure to thrive: Secondary | ICD-10-CM | POA: Diagnosis not present

## 2022-03-16 ENCOUNTER — Inpatient Hospital Stay: Payer: Medicare HMO

## 2022-03-16 ENCOUNTER — Emergency Department: Payer: Medicare HMO

## 2022-03-16 ENCOUNTER — Other Ambulatory Visit: Payer: Self-pay

## 2022-03-16 ENCOUNTER — Inpatient Hospital Stay
Admission: EM | Admit: 2022-03-16 | Discharge: 2022-03-22 | DRG: 163 | Disposition: A | Discharge status: Skilled Nursing Facility | Payer: Medicare HMO | Source: Skilled Nursing Facility | Attending: Internal Medicine | Admitting: Internal Medicine

## 2022-03-16 DIAGNOSIS — I471 Supraventricular tachycardia, unspecified: Secondary | ICD-10-CM | POA: Diagnosis present

## 2022-03-16 DIAGNOSIS — I771 Stricture of artery: Secondary | ICD-10-CM | POA: Diagnosis not present

## 2022-03-16 DIAGNOSIS — G20A1 Parkinson's disease without dyskinesia, without mention of fluctuations: Secondary | ICD-10-CM | POA: Diagnosis present

## 2022-03-16 DIAGNOSIS — E11319 Type 2 diabetes mellitus with unspecified diabetic retinopathy without macular edema: Secondary | ICD-10-CM | POA: Diagnosis not present

## 2022-03-16 DIAGNOSIS — R092 Respiratory arrest: Secondary | ICD-10-CM | POA: Diagnosis present

## 2022-03-16 DIAGNOSIS — E1122 Type 2 diabetes mellitus with diabetic chronic kidney disease: Secondary | ICD-10-CM | POA: Diagnosis present

## 2022-03-16 DIAGNOSIS — I69320 Aphasia following cerebral infarction: Secondary | ICD-10-CM

## 2022-03-16 DIAGNOSIS — T17908A Unspecified foreign body in respiratory tract, part unspecified causing other injury, initial encounter: Secondary | ICD-10-CM | POA: Diagnosis not present

## 2022-03-16 DIAGNOSIS — Z823 Family history of stroke: Secondary | ICD-10-CM

## 2022-03-16 DIAGNOSIS — Z6841 Body Mass Index (BMI) 40.0 and over, adult: Secondary | ICD-10-CM | POA: Diagnosis not present

## 2022-03-16 DIAGNOSIS — G931 Anoxic brain damage, not elsewhere classified: Secondary | ICD-10-CM

## 2022-03-16 DIAGNOSIS — E785 Hyperlipidemia, unspecified: Secondary | ICD-10-CM | POA: Diagnosis present

## 2022-03-16 DIAGNOSIS — K920 Hematemesis: Secondary | ICD-10-CM | POA: Diagnosis not present

## 2022-03-16 DIAGNOSIS — R293 Abnormal posture: Secondary | ICD-10-CM | POA: Diagnosis not present

## 2022-03-16 DIAGNOSIS — T17900A Unspecified foreign body in respiratory tract, part unspecified causing asphyxiation, initial encounter: Secondary | ICD-10-CM | POA: Diagnosis not present

## 2022-03-16 DIAGNOSIS — Z4682 Encounter for fitting and adjustment of non-vascular catheter: Secondary | ICD-10-CM | POA: Diagnosis not present

## 2022-03-16 DIAGNOSIS — Z7401 Bed confinement status: Secondary | ICD-10-CM | POA: Diagnosis not present

## 2022-03-16 DIAGNOSIS — J9601 Acute respiratory failure with hypoxia: Secondary | ICD-10-CM | POA: Diagnosis not present

## 2022-03-16 DIAGNOSIS — N1831 Chronic kidney disease, stage 3a: Secondary | ICD-10-CM | POA: Diagnosis not present

## 2022-03-16 DIAGNOSIS — R0989 Other specified symptoms and signs involving the circulatory and respiratory systems: Secondary | ICD-10-CM | POA: Diagnosis not present

## 2022-03-16 DIAGNOSIS — N138 Other obstructive and reflux uropathy: Secondary | ICD-10-CM | POA: Diagnosis present

## 2022-03-16 DIAGNOSIS — W44F3XA Food entering into or through a natural orifice, initial encounter: Secondary | ICD-10-CM | POA: Diagnosis present

## 2022-03-16 DIAGNOSIS — Z66 Do not resuscitate: Secondary | ICD-10-CM | POA: Diagnosis not present

## 2022-03-16 DIAGNOSIS — E1169 Type 2 diabetes mellitus with other specified complication: Secondary | ICD-10-CM | POA: Diagnosis not present

## 2022-03-16 DIAGNOSIS — T17920A Food in respiratory tract, part unspecified causing asphyxiation, initial encounter: Secondary | ICD-10-CM | POA: Diagnosis not present

## 2022-03-16 DIAGNOSIS — N401 Enlarged prostate with lower urinary tract symptoms: Secondary | ICD-10-CM | POA: Diagnosis present

## 2022-03-16 DIAGNOSIS — F02C0 Dementia in other diseases classified elsewhere, severe, without behavioral disturbance, psychotic disturbance, mood disturbance, and anxiety: Secondary | ICD-10-CM | POA: Diagnosis present

## 2022-03-16 DIAGNOSIS — N200 Calculus of kidney: Secondary | ICD-10-CM | POA: Diagnosis not present

## 2022-03-16 DIAGNOSIS — G894 Chronic pain syndrome: Secondary | ICD-10-CM | POA: Diagnosis present

## 2022-03-16 DIAGNOSIS — G9340 Encephalopathy, unspecified: Secondary | ICD-10-CM | POA: Diagnosis not present

## 2022-03-16 DIAGNOSIS — R0603 Acute respiratory distress: Secondary | ICD-10-CM | POA: Diagnosis not present

## 2022-03-16 DIAGNOSIS — Z86711 Personal history of pulmonary embolism: Secondary | ICD-10-CM

## 2022-03-16 DIAGNOSIS — R0689 Other abnormalities of breathing: Secondary | ICD-10-CM | POA: Diagnosis not present

## 2022-03-16 DIAGNOSIS — I129 Hypertensive chronic kidney disease with stage 1 through stage 4 chronic kidney disease, or unspecified chronic kidney disease: Secondary | ICD-10-CM | POA: Diagnosis present

## 2022-03-16 DIAGNOSIS — Z515 Encounter for palliative care: Secondary | ICD-10-CM

## 2022-03-16 DIAGNOSIS — R471 Dysarthria and anarthria: Secondary | ICD-10-CM | POA: Diagnosis present

## 2022-03-16 DIAGNOSIS — I469 Cardiac arrest, cause unspecified: Secondary | ICD-10-CM | POA: Diagnosis not present

## 2022-03-16 DIAGNOSIS — I517 Cardiomegaly: Secondary | ICD-10-CM | POA: Diagnosis not present

## 2022-03-16 DIAGNOSIS — G9341 Metabolic encephalopathy: Secondary | ICD-10-CM | POA: Diagnosis not present

## 2022-03-16 DIAGNOSIS — J9602 Acute respiratory failure with hypercapnia: Secondary | ICD-10-CM

## 2022-03-16 DIAGNOSIS — R4020 Unspecified coma: Secondary | ICD-10-CM | POA: Diagnosis not present

## 2022-03-16 DIAGNOSIS — R4182 Altered mental status, unspecified: Secondary | ICD-10-CM | POA: Diagnosis not present

## 2022-03-16 DIAGNOSIS — Z7984 Long term (current) use of oral hypoglycemic drugs: Secondary | ICD-10-CM

## 2022-03-16 DIAGNOSIS — J690 Pneumonitis due to inhalation of food and vomit: Secondary | ICD-10-CM | POA: Diagnosis not present

## 2022-03-16 DIAGNOSIS — R000 Tachycardia, unspecified: Secondary | ICD-10-CM | POA: Diagnosis not present

## 2022-03-16 DIAGNOSIS — J9811 Atelectasis: Secondary | ICD-10-CM | POA: Diagnosis not present

## 2022-03-16 DIAGNOSIS — E669 Obesity, unspecified: Secondary | ICD-10-CM | POA: Diagnosis present

## 2022-03-16 DIAGNOSIS — K439 Ventral hernia without obstruction or gangrene: Secondary | ICD-10-CM | POA: Diagnosis not present

## 2022-03-16 DIAGNOSIS — R0902 Hypoxemia: Secondary | ICD-10-CM | POA: Diagnosis not present

## 2022-03-16 DIAGNOSIS — Z6833 Body mass index (BMI) 33.0-33.9, adult: Secondary | ICD-10-CM

## 2022-03-16 DIAGNOSIS — I10 Essential (primary) hypertension: Secondary | ICD-10-CM | POA: Diagnosis not present

## 2022-03-16 DIAGNOSIS — T17308A Unspecified foreign body in larynx causing other injury, initial encounter: Principal | ICD-10-CM

## 2022-03-16 DIAGNOSIS — R4701 Aphasia: Secondary | ICD-10-CM | POA: Diagnosis present

## 2022-03-16 DIAGNOSIS — Z79899 Other long term (current) drug therapy: Secondary | ICD-10-CM

## 2022-03-16 DIAGNOSIS — Z7902 Long term (current) use of antithrombotics/antiplatelets: Secondary | ICD-10-CM

## 2022-03-16 DIAGNOSIS — Z7189 Other specified counseling: Secondary | ICD-10-CM | POA: Diagnosis not present

## 2022-03-16 DIAGNOSIS — T17828A Food in other parts of respiratory tract causing other injury, initial encounter: Principal | ICD-10-CM | POA: Diagnosis present

## 2022-03-16 LAB — COMPREHENSIVE METABOLIC PANEL
ALT: 61 U/L — ABNORMAL HIGH (ref 0–44)
AST: 60 U/L — ABNORMAL HIGH (ref 15–41)
Albumin: 3.7 g/dL (ref 3.5–5.0)
Alkaline Phosphatase: 96 U/L (ref 38–126)
Anion gap: 18 — ABNORMAL HIGH (ref 5–15)
BUN: UNDETERMINED mg/dL (ref 8–23)
CO2: 15 mmol/L — ABNORMAL LOW (ref 22–32)
Calcium: 9.1 mg/dL (ref 8.9–10.3)
Chloride: 104 mmol/L (ref 98–111)
Creatinine, Ser: UNDETERMINED mg/dL (ref 0.61–1.24)
Glucose, Bld: 212 mg/dL — ABNORMAL HIGH (ref 70–99)
Potassium: 3.9 mmol/L (ref 3.5–5.1)
Sodium: 137 mmol/L (ref 135–145)
Total Bilirubin: 0.9 mg/dL (ref 0.3–1.2)
Total Protein: 7.2 g/dL (ref 6.5–8.1)

## 2022-03-16 LAB — URINALYSIS, COMPLETE (UACMP) WITH MICROSCOPIC
Bilirubin Urine: NEGATIVE
Glucose, UA: 150 mg/dL — AB
Ketones, ur: 5 mg/dL — AB
Leukocytes,Ua: NEGATIVE
Nitrite: NEGATIVE
Protein, ur: 100 mg/dL — AB
Specific Gravity, Urine: 1.034 — ABNORMAL HIGH (ref 1.005–1.030)
pH: 5 (ref 5.0–8.0)

## 2022-03-16 LAB — PHOSPHORUS: Phosphorus: 4.3 mg/dL (ref 2.5–4.6)

## 2022-03-16 LAB — CBC
HCT: 44.6 % (ref 39.0–52.0)
Hemoglobin: 14.6 g/dL (ref 13.0–17.0)
MCH: 29.1 pg (ref 26.0–34.0)
MCHC: 32.7 g/dL (ref 30.0–36.0)
MCV: 89 fL (ref 80.0–100.0)
Platelets: 247 10*3/uL (ref 150–400)
RBC: 5.01 MIL/uL (ref 4.22–5.81)
RDW: 13.4 % (ref 11.5–15.5)
WBC: 12 10*3/uL — ABNORMAL HIGH (ref 4.0–10.5)
nRBC: 0 % (ref 0.0–0.2)

## 2022-03-16 LAB — BLOOD GAS, ARTERIAL
Acid-base deficit: 5.3 mmol/L — ABNORMAL HIGH (ref 0.0–2.0)
Bicarbonate: 22 mmol/L (ref 20.0–28.0)
FIO2: 60 %
MECHVT: 500 mL
Mechanical Rate: 16
O2 Saturation: 99.9 %
PEEP: 5 cmH2O
Patient temperature: 37
pCO2 arterial: 49 mmHg — ABNORMAL HIGH (ref 32–48)
pH, Arterial: 7.26 — ABNORMAL LOW (ref 7.35–7.45)
pO2, Arterial: 158 mmHg — ABNORMAL HIGH (ref 83–108)

## 2022-03-16 LAB — GLUCOSE, CAPILLARY
Glucose-Capillary: 157 mg/dL — ABNORMAL HIGH (ref 70–99)
Glucose-Capillary: 235 mg/dL — ABNORMAL HIGH (ref 70–99)

## 2022-03-16 LAB — CREATININE, SERUM
Creatinine, Ser: 1.41 mg/dL — ABNORMAL HIGH (ref 0.61–1.24)
GFR, Estimated: 54 mL/min — ABNORMAL LOW (ref >60)

## 2022-03-16 LAB — TROPONIN I (HIGH SENSITIVITY)
Troponin I (High Sensitivity): 20 ng/L — ABNORMAL HIGH (ref <18)
Troponin I (High Sensitivity): 58 ng/L — ABNORMAL HIGH (ref <18)

## 2022-03-16 LAB — TSH: TSH: 1.981 u[IU]/mL (ref 0.350–4.500)

## 2022-03-16 LAB — MAGNESIUM: Magnesium: 1.8 mg/dL (ref 1.7–2.4)

## 2022-03-16 LAB — MRSA NEXT GEN BY PCR, NASAL: MRSA by PCR Next Gen: NOT DETECTED

## 2022-03-16 LAB — BETA-HYDROXYBUTYRIC ACID: Beta-Hydroxybutyric Acid: 0.3 mmol/L — ABNORMAL HIGH (ref 0.05–0.27)

## 2022-03-16 LAB — T4, FREE: Free T4: 1.27 ng/dL — ABNORMAL HIGH (ref 0.61–1.12)

## 2022-03-16 LAB — LACTIC ACID, PLASMA
Lactic Acid, Venous: 4 mmol/L (ref 0.5–1.9)
Lactic Acid, Venous: 2.2 mmol/L (ref 0.5–1.9)

## 2022-03-16 LAB — BUN: BUN: 24 mg/dL — ABNORMAL HIGH (ref 8–23)

## 2022-03-16 LAB — BRAIN NATRIURETIC PEPTIDE: B Natriuretic Peptide: 106.7 pg/mL — ABNORMAL HIGH (ref 0.0–100.0)

## 2022-03-16 LAB — LIPASE, BLOOD: Lipase: 35 U/L (ref 11–51)

## 2022-03-16 LAB — PROCALCITONIN: Procalcitonin: <0.1 ng/mL

## 2022-03-16 MED ORDER — ORAL CARE MOUTH RINSE: 15.0000 mL | OROMUCOSAL | Status: DC | PRN

## 2022-03-16 MED ORDER — VECURONIUM BROMIDE 10 MG IV SOLR
INTRAVENOUS | Status: DC | PRN
  Administered 2022-03-16: 12 ug via INTRAVENOUS

## 2022-03-16 MED ORDER — FENTANYL CITRATE PF 50 MCG/ML IJ SOSY
100.0000 ug | PREFILLED_SYRINGE | Freq: Once | INTRAMUSCULAR | Status: AC
  Administered 2022-03-16: 100 ug via INTRAVENOUS
  Filled 2022-03-16: qty 2

## 2022-03-16 MED ORDER — IOHEXOL 350 MG/ML SOLN
100.0000 mL | Freq: Once | INTRAVENOUS | Status: AC | PRN
  Administered 2022-03-16: 100 mL via INTRAVENOUS

## 2022-03-16 MED ORDER — CLOPIDOGREL BISULFATE 75 MG PO TABS
75.0000 mg | ORAL_TABLET | Freq: Every day | ORAL | Status: DC
  Administered 2022-03-17: 75 mg
  Filled 2022-03-16: qty 1

## 2022-03-16 MED ORDER — NOREPINEPHRINE 4 MG/250ML-% IV SOLN
0.0000 ug/min | INTRAVENOUS | Status: DC
  Administered 2022-03-16 (×2): 2 ug/min via INTRAVENOUS
  Filled 2022-03-16: qty 250

## 2022-03-16 MED ORDER — FAMOTIDINE 20 MG PO TABS
20.0000 mg | ORAL_TABLET | Freq: Two times a day (BID) | ORAL | Status: DC
  Administered 2022-03-16 – 2022-03-17 (×2): 20 mg
  Filled 2022-03-16 (×2): qty 1

## 2022-03-16 MED ORDER — POLYETHYLENE GLYCOL 3350 17 G PO PACK
17.0000 g | PACK | Freq: Every day | ORAL | Status: DC | PRN
  Administered 2022-03-19: 17 g
  Filled 2022-03-16: qty 1

## 2022-03-16 MED ORDER — ASPIRIN 81 MG PO CHEW
324.0000 mg | CHEWABLE_TABLET | ORAL | Status: AC
  Administered 2022-03-16: 324 mg
  Filled 2022-03-16: qty 4

## 2022-03-16 MED ORDER — ETOMIDATE 2 MG/ML IV SOLN
INTRAVENOUS | Status: AC
  Filled 2022-03-16: qty 20

## 2022-03-16 MED ORDER — LACTATED RINGERS IV BOLUS
500.0000 mL | Freq: Once | INTRAVENOUS | Status: AC
  Administered 2022-03-17: 500 mL via INTRAVENOUS

## 2022-03-16 MED ORDER — DOCUSATE SODIUM 50 MG/5ML PO LIQD
100.0000 mg | Freq: Two times a day (BID) | ORAL | Status: DC
  Administered 2022-03-16 – 2022-03-17 (×2): 100 mg
  Filled 2022-03-16 (×2): qty 10

## 2022-03-16 MED ORDER — INSULIN ASPART 100 UNIT/ML IJ SOLN
0.0000 [IU] | INTRAMUSCULAR | Status: DC
  Administered 2022-03-16: 7 [IU] via SUBCUTANEOUS
  Administered 2022-03-17 (×2): 4 [IU] via SUBCUTANEOUS
  Administered 2022-03-17: 3 [IU] via SUBCUTANEOUS
  Administered 2022-03-18: 7 [IU] via SUBCUTANEOUS
  Administered 2022-03-19 (×2): 3 [IU] via SUBCUTANEOUS
  Administered 2022-03-19: 4 [IU] via SUBCUTANEOUS
  Filled 2022-03-16 (×8): qty 1

## 2022-03-16 MED ORDER — POLYETHYLENE GLYCOL 3350 17 G PO PACK
17.0000 g | PACK | Freq: Every day | ORAL | Status: DC
  Administered 2022-03-16 – 2022-03-17 (×2): 17 g
  Filled 2022-03-16 (×2): qty 1

## 2022-03-16 MED ORDER — NOREPINEPHRINE 4 MG/250ML-% IV SOLN
2.0000 ug/min | INTRAVENOUS | Status: DC
  Administered 2022-03-16: 6 ug/min via INTRAVENOUS
  Administered 2022-03-17: 7 ug/min via INTRAVENOUS
  Filled 2022-03-16: qty 250

## 2022-03-16 MED ORDER — ORAL CARE MOUTH RINSE
15.0000 mL | OROMUCOSAL | Status: DC
  Administered 2022-03-16 – 2022-03-17 (×11): 15 mL via OROMUCOSAL

## 2022-03-16 MED ORDER — LACTATED RINGERS IV BOLUS
1000.0000 mL | Freq: Once | INTRAVENOUS | Status: AC
  Administered 2022-03-16: 1000 mL via INTRAVENOUS

## 2022-03-16 MED ORDER — HEPARIN SODIUM (PORCINE) 5000 UNIT/ML IJ SOLN
5000.0000 [IU] | Freq: Two times a day (BID) | INTRAMUSCULAR | Status: DC
  Administered 2022-03-16 – 2022-03-20 (×8): 5000 [IU] via SUBCUTANEOUS
  Filled 2022-03-16 (×8): qty 1

## 2022-03-16 MED ORDER — PROPOFOL 1000 MG/100ML IV EMUL
INTRAVENOUS | Status: AC
  Filled 2022-03-16: qty 100

## 2022-03-16 MED ORDER — PROPOFOL 1000 MG/100ML IV EMUL
5.0000 ug/kg/min | INTRAVENOUS | Status: DC
  Administered 2022-03-16 (×2): 30 ug/kg/min via INTRAVENOUS
  Administered 2022-03-17: 20 ug/kg/min via INTRAVENOUS
  Filled 2022-03-16 (×2): qty 100

## 2022-03-16 MED ORDER — ETOMIDATE 2 MG/ML IV SOLN
INTRAVENOUS | Status: DC | PRN
  Administered 2022-03-16: 30 mg via INTRAVENOUS

## 2022-03-16 MED ORDER — MIDAZOLAM HCL 2 MG/2ML IJ SOLN
1.0000 mg | INTRAMUSCULAR | Status: DC | PRN
  Administered 2022-03-17: 2 mg via INTRAVENOUS
  Filled 2022-03-16: qty 2

## 2022-03-16 MED ORDER — ASPIRIN 300 MG RE SUPP: 300.0000 mg | RECTAL | Status: AC

## 2022-03-16 MED ORDER — CHLORHEXIDINE GLUCONATE CLOTH 2 % EX PADS
6.0000 | MEDICATED_PAD | Freq: Every day | CUTANEOUS | Status: DC
  Administered 2022-03-17 – 2022-03-18 (×2): 6 via TOPICAL

## 2022-03-16 MED ORDER — DOCUSATE SODIUM 50 MG/5ML PO LIQD: 100.0000 mg | Freq: Two times a day (BID) | ORAL | Status: DC | PRN

## 2022-03-16 MED ORDER — SODIUM CHLORIDE 0.9 % IV SOLN: 250.0000 mL | INTRAVENOUS | Status: DC

## 2022-03-16 MED ORDER — VECURONIUM BROMIDE 10 MG IV SOLR
INTRAVENOUS | Status: AC
  Filled 2022-03-16: qty 20

## 2022-03-16 MED ORDER — SODIUM CHLORIDE 0.9 % IV SOLN
3.0000 g | Freq: Four times a day (QID) | INTRAVENOUS | Status: DC
  Administered 2022-03-16 – 2022-03-20 (×15): 3 g via INTRAVENOUS
  Filled 2022-03-16: qty 8
  Filled 2022-03-16 (×3): qty 3
  Filled 2022-03-16: qty 8
  Filled 2022-03-16: qty 3
  Filled 2022-03-16 (×2): qty 8
  Filled 2022-03-16 (×4): qty 3
  Filled 2022-03-16 (×3): qty 8
  Filled 2022-03-16 (×2): qty 3

## 2022-03-16 MED ORDER — PROPOFOL 1000 MG/100ML IV EMUL: 5.0000 ug/kg/min | INTRAVENOUS | Status: DC

## 2022-03-16 MED ORDER — FENTANYL 2500MCG IN NS 250ML (10MCG/ML) PREMIX INFUSION
0.0000 ug/h | INTRAVENOUS | Status: DC
  Administered 2022-03-16: 25 ug/h via INTRAVENOUS
  Administered 2022-03-17: 150 ug/h via INTRAVENOUS
  Filled 2022-03-16 (×2): qty 250

## 2022-03-16 NOTE — ED Notes (Signed)
Called RT to assist this RN in getting pt to CT; states will be by shortly.

## 2022-03-16 NOTE — ED Notes (Signed)
Repeat EKG being captured now since first attempt by other RN caught lots of artifact.

## 2022-03-16 NOTE — Progress Notes (Signed)
Pharmacy Electrolyte Monitoring Consult:  Pharmacy consulted to assist in monitoring and replacing electrolytes in this 67 y.o. male admitted on 03/16/2022 with AMS, aspiration of food, severe parkinsons   Labs:  Sodium (mmol/L)  Date Value  03/16/2022 137  03/16/2020 138   Potassium (mmol/L)  Date Value  03/16/2022 3.9   Magnesium (mg/dL)  Date Value  10/11/2020 1.8   Phosphorus (mg/dL)  Date Value  10/02/2019 3.4   Calcium (mg/dL)  Date Value  03/16/2022 9.1   Albumin (g/dL)  Date Value  03/16/2022 3.7  03/16/2020 3.8    Assessment/Plan: No electrolyte replacement at this time F/u with am labs  Aviyanna Colbaugh A 03/16/2022 6:29 PM

## 2022-03-16 NOTE — ED Triage Notes (Signed)
Pt in via EMS from [facility] after getting piece of steak stuck in throat causing resp distress; EMS was able to pull steak out of throat with forceps; unresponsive to EMS whole time per EMS; HTN initially per EMS; ST 120; 95% on 4L; CO2 initially in 50s but now 25-30 per EMS; 18g L fa by EMS; pt was initially bagged by EMS until able to breathe on own.

## 2022-03-16 NOTE — ED Notes (Signed)
Lauren RN attempted for OG tube placement x3.

## 2022-03-16 NOTE — ED Provider Notes (Signed)
[facility] Provider Note    Event Date/Time   First MD Initiated Contact with Patient 03/16/22 1355     (approximate)   History   Respiratory arrest / choking  EM caveat, non-responsive  HPI  Keith Carter is a 67 y.o. male presents after an episode of apparent choking.  Evidently was found unresponsive by staff at his care facility who had initiated CPR.  EMS arrived on scene and reports that he noted difficulty with ventilation, at this juncture intubation was attempted and a large piece of steak was removed from the patient's upper airway or trachea.  He was then able to be ventilated but remained unresponsive requiring respirated assistance and 100% oxygenation       Physical Exam   Triage Vital Signs: ED Triage Vitals  Enc Vitals Group     BP 03/16/22 1347 (!) 194/106     Pulse Rate 03/16/22 1339 (!) 125     Resp 03/16/22 1341 (!) 21     Temp 03/16/22 1350 97.7 F (36.5 C)     Temp Source 03/16/22 1350 Axillary     SpO2 03/16/22 1339 93 %     Weight 03/16/22 1348 300 lb (136.1 kg)     Height 03/16/22 1348 6' (1.829 m)     Head Circumference --      Peak Flow --      Pain Score --      Pain Loc --      Pain Edu? --      Excl. in [place]? --     Most recent vital signs: Vitals:   03/17/22 1330 03/17/22 1345  BP: 112/70 (!) 96/58  Pulse: 71 67  Resp: 15 12  Temp: 99.3 F (37.4 C) 99.5 F (37.5 C)  SpO2: 96% 96%     General: Labored rapid and slightly shallow respirations.  Patient using her making slight moaning noises between respirations but nonresponsive.  He appears critically ill.  He is currently oxygenating adequately on 100% nonrebreather  CV:  Good peripheral perfusion.  Tachycardia without obvious murmur Resp:  Tachypnea, shallow, somewhat coarse lung sounds with no obvious crackles.  He appears in respiratory distress and is unresponsive to all stimuli. Rather firm mass noted in the mid to right epigastrium Abd:  No  distention.  Other:     ED Results / Procedures / Treatments   Labs (all labs ordered are listed, but only abnormal results are displayed) Labs Reviewed  CBC - Abnormal; Notable for the following components:      Result Value   WBC 12.0 (*)    All other components within normal limits  COMPREHENSIVE METABOLIC PANEL - Abnormal; Notable for the following components:   CO2 15 (*)    Glucose, Bld 212 (*)    AST 60 (*)    ALT 61 (*)    Anion gap 18 (*)    All other components within normal limits  LACTIC ACID, PLASMA - Abnormal; Notable for the following components:   Lactic Acid, Venous 4.0 (*)    All other components within normal limits  BLOOD GAS, ARTERIAL - Abnormal; Notable for the following components:   pH, Arterial 7.26 (*)    pCO2 arterial 49 (*)    pO2, Arterial 158 (*)    Acid-base deficit 5.3 (*)    All other components within normal limits  BUN - Abnormal; Notable for the following components:   BUN 24 (*)    All  other components within normal limits  CREATININE, SERUM - Abnormal; Notable for the following components:   Creatinine, Ser 1.41 (*)    GFR, Estimated 54 (*)    All other components within normal limits  LACTIC ACID, PLASMA - Abnormal; Notable for the following components:   Lactic Acid, Venous 3.5 (*)    All other components within normal limits  BASIC METABOLIC PANEL - Abnormal; Notable for the following components:   Glucose, Bld 144 (*)    BUN 27 (*)    Creatinine, Ser 1.33 (*)    Calcium 8.5 (*)    GFR, Estimated 58 (*)    All other components within normal limits  GLUCOSE, CAPILLARY - Abnormal; Notable for the following components:   Glucose-Capillary 235 (*)    All other components within normal limits  BLOOD GAS, ARTERIAL - Abnormal; Notable for the following components:   pO2, Arterial 119 (*)    All other components within normal limits  HEMOGLOBIN A1C - Abnormal; Notable for the following components:   Hgb A1c MFr Bld 6.5 (*)    All  other components within normal limits  URINALYSIS, COMPLETE (UACMP) WITH MICROSCOPIC - Abnormal; Notable for the following components:   Color, Urine YELLOW (*)    APPearance CLEAR (*)    Specific Gravity, Urine 1.034 (*)    Glucose, UA 150 (*)    Hgb urine dipstick SMALL (*)    Ketones, ur 5 (*)    Protein, ur 100 (*)    Bacteria, UA RARE (*)    All other components within normal limits  LACTIC ACID, PLASMA - Abnormal; Notable for the following components:   Lactic Acid, Venous 2.2 (*)    All other components within normal limits  BRAIN NATRIURETIC PEPTIDE - Abnormal; Notable for the following components:   B Natriuretic Peptide 106.7 (*)    All other components within normal limits  HEPATIC FUNCTION PANEL - Abnormal; Notable for the following components:   Total Protein 5.8 (*)    Albumin 3.0 (*)    All other components within normal limits  CBC WITH DIFFERENTIAL/PLATELET - Abnormal; Notable for the following components:   WBC 15.1 (*)    Hemoglobin 12.3 (*)    HCT 38.4 (*)    Neutro Abs 12.1 (*)    Monocytes Absolute 1.4 (*)    Abs Immature Granulocytes 0.11 (*)    All other components within normal limits  T4, FREE - Abnormal; Notable for the following components:   Free T4 1.27 (*)    All other components within normal limits  BETA-HYDROXYBUTYRIC ACID - Abnormal; Notable for the following components:   Beta-Hydroxybutyric Acid 0.30 (*)    All other components within normal limits  GLUCOSE, CAPILLARY - Abnormal; Notable for the following components:   Glucose-Capillary 157 (*)    All other components within normal limits  GLUCOSE, CAPILLARY - Abnormal; Notable for the following components:   Glucose-Capillary 134 (*)    All other components within normal limits  GLUCOSE, CAPILLARY - Abnormal; Notable for the following components:   Glucose-Capillary 121 (*)    All other components within normal limits  TROPONIN I (HIGH SENSITIVITY) - Abnormal; Notable for the following  components:   Troponin I (High Sensitivity) 20 (*)    All other components within normal limits  TROPONIN I (HIGH SENSITIVITY) - Abnormal; Notable for the following components:   Troponin I (High Sensitivity) 58 (*)    All other components within normal limits  MRSA  NEXT GEN BY PCR, NASAL  CULTURE, BLOOD (ROUTINE X 2)  CULTURE, BLOOD (ROUTINE X 2)  CULTURE, RESPIRATORY W GRAM STAIN  CULTURE, BAL-QUANTITATIVE W GRAM STAIN  LIPASE, BLOOD  HIV ANTIBODY (ROUTINE TESTING W REFLEX)  MAGNESIUM  PHOSPHORUS  TRIGLYCERIDES  PROCALCITONIN  TSH  MAGNESIUM  PROCALCITONIN  PHOSPHORUS  LACTIC ACID, PLASMA  GLUCOSE, CAPILLARY  T3, FREE  CBG MONITORING, ED     EKG  Suspect underlying right bundle branch block with likely sinus tachycardia.  Nonspecific T wave abnormality.  No STEMI   RADIOLOGY  Chest x-ray interpreted by me as adequate placement of endotracheal tube.  Pending CT head chest abdomen and pelvis for further evaluation, follow-up by Dr. Jacelyn Grip   PROCEDURES:  [place] performed: Yes, see critical care procedure note(s)  Procedure Name: Intubation Date/Time: 03/16/2022 3:35 PM  Performed by: Delman Kitten, MDPre-anesthesia Checklist: Patient identified, Emergency Drugs available, Suction available and Patient being monitored Oxygen Delivery Method: Ambu bag Preoxygenation: Pre-oxygenation with 100% oxygen Induction Type: IV induction and Rapid sequence Ventilation: Mask ventilation without difficulty Laryngoscope Size: Glidescope and 3 Grade View: Grade III Tube type: Subglottic suction tube Tube size: 7.5 mm Number of attempts: 1 Airway Equipment and Method: Patient positioned with wedge pillow Placement Confirmation: ETT inserted through vocal cords under direct vision, CO2 detector and Breath sounds checked- equal and bilateral Secured at: 26 cm Tube secured with: ETT holder Dental Injury: Teeth and Oropharynx as per pre-operative assessment  Comments: No noted  difficulty. no desaturations.  No evidence of aspiration    CRITICAL CARE Performed by: Delman Kitten   Total critical care time: 45 minutes  Critical care time was exclusive of separately billable procedures and treating other patients.  Critical care was necessary to treat or prevent imminent or life-threatening deterioration.  Critical care was time spent personally by me on the following activities: development of treatment plan with patient and/or surrogate as well as nursing, discussions with consultants, evaluation of patient's response to treatment, examination of patient, obtaining history from patient or surrogate, ordering and performing treatments and interventions, ordering and review of laboratory studies, ordering and review of radiographic studies, pulse oximetry and re-evaluation of patient's condition.    MEDICATIONS ORDERED IN ED: Medications  etomidate (AMIDATE) 2 MG/ML injection (  Not Given 03/16/22 1507)  vecuronium (NORCURON) 10 MG injection (  Not Given 03/16/22 1507)  fentaNYL 2514mg in NS 2578m(1046mml) infusion-PREMIX (150 mcg/hr Intravenous New Bag/Given 03/17/22 1038)  docusate (COLACE) 50 MG/5ML liquid 100 mg (has no administration in time range)  polyethylene glycol (MIRALAX / GLYCOLAX) packet 17 g (has no administration in time range)  heparin injection 5,000 Units (5,000 Units Subcutaneous Given 03/17/22 1035)  0.9 %  sodium chloride infusion (0 mLs Intravenous Hold 03/16/22 1940)  norepinephrine (LEVOPHED) 4mg82m 250mL36m016 mg/mL) premix infusion (8 mcg/min Intravenous Infusion Verify 03/17/22 0700)  Chlorhexidine Gluconate Cloth 2 % PADS 6 each (6 each Topical Given 03/17/22 0528)  propofol (DIPRIVAN) 1000 MG/100ML infusion (10 mcg/kg/min  112 kg Intravenous Restarted 03/17/22 1306)  famotidine (PEPCID) tablet 20 mg (20 mg Per Tube Given 03/17/22 1035)  docusate (COLACE) 50 MG/5ML liquid 100 mg (100 mg Per Tube Given 03/17/22 1034)  polyethylene glycol  (MIRALAX / GLYCOLAX) packet 17 g (17 g Per Tube Given 03/17/22 1050)  insulin aspart (novoLOG) injection 0-20 Units (0 Units Subcutaneous Not Given 03/17/22 1307)  Oral care mouth rinse (15 mLs Mouth Rinse Given 03/17/22 1200)  Oral care mouth rinse (  has no administration in time range)  midazolam (VERSED) injection 1-2 mg (2 mg Intravenous Given 03/17/22 0143)  clopidogrel (PLAVIX) tablet 75 mg (75 mg Per Tube Given 03/17/22 1035)  Ampicillin-Sulbactam (UNASYN) 3 g in sodium chloride 0.9 % 100 mL IVPB (3 g Intravenous New Bag/Given 03/17/22 0850)  lactated ringers bolus 1,000 mL (1,000 mLs Intravenous New Bag/Given 03/16/22 1641)  iohexol (OMNIPAQUE) 350 MG/ML injection 100 mL (100 mLs Intravenous Contrast Given 03/16/22 1542)  fentaNYL (SUBLIMAZE) injection 100 mcg (100 mcg Intravenous Given 03/16/22 1629)  aspirin chewable tablet 324 mg (324 mg Per Tube Given 03/16/22 2003)    Or  aspirin suppository 300 mg ( Rectal See Alternative 03/16/22 2003)  lactated ringers bolus 500 mL ( Intravenous Infusion Verify 03/17/22 0100)     IMPRESSION / MDM / ASSESSMENT AND PLAN / ED COURSE  I reviewed the triage vital signs and the nursing notes.                              Differential diagnosis includes, but is not limited to, aspiration, foreign body, choking, potential preceding acute event such as syncope, cardiac arrest etc.  The patient did not require defibrillation and does not identified to have had a obvious life-threatening arrhythmia prior to arrival to the ER.  I suspect based on clinical history the patient may have had a choking episode with resultant anoxia, and his mental status is at this point on reassuring but we do not know yet his outcome.  I discussed with both his daughter as well as his son.  His son is a Audiological scientist in [place].  After discussing the case and the clinical history, initially he was believed to be DO NOT RESUSCITATE, but given the circumstances he was made full code for attempts to  resuscitate.  Patient was intubated successfully oxygenated placed on propofol.  He did have some these decerebrate type posturing initially, I have placed a consult with our neurologist for high concern for potential posturing, anoxic brain injury, less likely felt to be an underlying primary seizure.  Nonetheless, patient is being sedated with propofol, certainly anticipate neurology consultation which Dr. Quinn Axe will be providing, and further workup which has been assigned to my partner Dr.Wong which includes follow-up on CT of the head chest abdomen pelvis as we had to delineate potential other etiologies for arrest such as acute pulmonary embolism, ACS, metabolic or other significant derangements etc.   Patient is clearly in critical condition and anticipated admission to critical care team.  Patient's son updated, aware of the gravity of the situation.  Patient's daughter should be arriving to hospital shortly  Patient's presentation is most consistent with acute presentation with potential threat to life or bodily function.   The patient is on the cardiac monitor to evaluate for evidence of arrhythmia and/or significant heart rate changes.  Clinical Course as of 03/17/22 1352  Wed Mar 16, 2022  1357 Poke with the patient's daughter who advised that she is the patient's healthcare power of attorney.  Her mother has debility and severe Parkinson's, quite severe.  Discussed the acuity and the severity of his critical illness, his unresponsiveness, and goals of care.  She advises that he has a most form on file at the nursing facility.  The MOST form did not accompany him.  She advises she believes the patient is "DNR" but asked for a brief period to try to call her brother who  is a critical care paramedic in [place] [MQ]  1358 At this juncture, I think certainly the patient has a time sensitive decision. [MQ]  69 Consult to Dr. Quinn Axe of neurology placed stat.  Patient remains intubated, currently  sedated on propofol.  Patient's family including son and daughter [name] have relayed status as full code at this time.  Ongoing care assigned to Dr. Ronalee Red, follow-up on pending labs as well as CT imaging and neurology consultation.  Obvious need for ICU admission but pending imaging [MQ]    Clinical Course User Index [MQ] Delman Kitten, MD   Is interpreted as increased lactic acid, very minimally increased troponin and BNP.  Preserved renal function  FINAL CLINICAL IMPRESSION(S) / ED DIAGNOSES   Final diagnoses:  Choking, initial encounter  Respiratory arrest ([place])     Rx / DC Orders   ED Discharge Orders     None        Note:  This document was prepared using Dragon voice recognition software and may include unintentional dictation errors.   Delman Kitten, MD 03/17/22 [phone]

## 2022-03-16 NOTE — ED Provider Notes (Addendum)
I received signout on this patient, currently intubated.  See original provider note for full H&P.  He is pending CT imaging and neurology consult prior to disposition to the ICU. I informed his daughter of plan of care. He is resting intubated sedated. ICU consulted for admission 1630 I reviewed ECG RBBB sinus tachy 120s no stemi (similar morphology to July 2023 ECG)  BP softening, stopped propofol. Systolic 0000000; strong carotid pulse. Levophed started.    Lucillie Garfinkel, MD 03/16/22 1531    Lucillie Garfinkel, MD 03/16/22 1642    Lucillie Garfinkel, MD 03/16/22 1651    Lucillie Garfinkel, MD 03/16/22 [phone]

## 2022-03-16 NOTE — ED Notes (Signed)
Vec to be given first per EDP Quale. Preferred LMA found in airway box when checked again.

## 2022-03-16 NOTE — ED Notes (Signed)
26 at teeth per EDP Quale.

## 2022-03-16 NOTE — ED Notes (Signed)
Resp Therapy at bedside.

## 2022-03-16 NOTE — ED Notes (Signed)
Pt posturing; arms stiff; EDP Quale and RT at bedside.

## 2022-03-16 NOTE — ED Notes (Signed)
Pt placed on non-rebreather to re-oxygenate before intubation per EDP Quale's verbal order.

## 2022-03-16 NOTE — ED Notes (Signed)
EMS states facility staff had started CPR before their arrival; EMS states pulse palpable so they never had to start CPR.

## 2022-03-16 NOTE — ED Notes (Signed)
EDP Quale states will go ahead and intubate without preferred LMA as staff/RT unable to find and charge states likely not stocked currently.

## 2022-03-16 NOTE — ED Notes (Signed)
Pt suctioned by staff as blood in mouth; repositioned; hernia noted at abdomen; pt opens eyes to movement; pt diaphoretic; EDP Quale on phone with pt's daughter who is POA; pt grunting.

## 2022-03-16 NOTE — ED Notes (Signed)
Pt's family decided on full code per EDP Quale.

## 2022-03-16 NOTE — ED Notes (Signed)
Notified EDP Quale of low temp and that warm blankets applied; awaiting reply/orders for if bair hugger desired.

## 2022-03-16 NOTE — ED Notes (Addendum)
Called lab since CMP not yet resulted in chart & CT needs this info b4 pt can go for imaging. Staff states they did indeed receive 2nd tube at same time as 1st tube and will run it now since not enough blood left in 1st tube.

## 2022-03-16 NOTE — ED Notes (Signed)
Called CT to confirm good to go to room 3; staff confirmed. RT on way to pt's room now.

## 2022-03-16 NOTE — Progress Notes (Signed)
Pharmacy Antibiotic Note  Keith Carter is a 67 y.o. male admitted on 03/16/2022 with aspiration pneumonia.  Pharmacy has been consulted for unasyn dosing.  -hx severe parkinsons -per H&P: patient had steak lodged in the airway which was removed by EMS personnel in the field.   Plan: Unasyn 3 gm IV q6h   Height: 6' (182.9 cm) Weight: 112 kg (246 lb 14.6 oz) IBW/kg (Calculated) : 77.6  Temp (24hrs), Avg:96.8 F (36 C), Min:95.5 F (35.3 C), Max:97.7 F (36.5 C)  Recent Labs  Lab 03/16/22 1344 03/16/22 1702  WBC 12.0*  --   CREATININE 1.41*  QUANTITY NOT SUFFICIENT, UNABLE TO PERFORM TEST  --   LATICACIDVEN 4.0* 3.5*    CrCl cannot be calculated (This lab value cannot be used to calculate CrCl because it is not a number: QUANTITY NOT SUFFICIENT, UNABLE TO PERFORM TEST).    No Known Allergies  Antimicrobials this admission: Unasyn  2/14 >>       >>    Dose adjustments this admission:    Microbiology results: 2/14 BCx: pend   UCx:    2/14 Sputum:trach aspirate:  2/14 MRSA PCR: pending  Thank you for allowing pharmacy to be a part of this patient's care.  Kadin Bera A 03/16/2022 7:59 PM

## 2022-03-16 NOTE — H&P (Signed)
CRITICAL CARE     Name: Keith Carter MRN: [id number] DOB: Aug 08, 1955     LOS: 0   SUBJECTIVE FINDINGS & SIGNIFICANT EVENTS    History of presenting illness:  This is a cognitively impaired 67 year old male with dementia and severe Parkinson's disease, he also has a history of CVA uncontrolled hypertension with episodes of hypertensive emergency, history of massive PE a year ago, poorly controlled diabetes with diabetic retinopathy, epidural lipomatosis, multilevel lumbar osteoarthritis with stenosis and scoliosis, obstructive uropathy with BPH, chronic pain syndrome, morbid obesity with a BMI over 40 chronic aphasia and anxiety disorder and erectile dysfunction with CODE STATUS DNR who came in via emergency room due to unresponsive mental status.  In the ED patient was evaluated by neurologist who did not recommend continuous EEG transfer.  Per ER doc patient had steak lodged in the airway which was removed by EMS personnel in the field.  Patient had CT head in the ER which shows mild diffuse atrophy and moderate periventricular deep white matter hypodensity due to small chronic vessel ischemia.  Per documentation ER provider had conversation with daughter who wanted patient intubated and he has come to the medical intensive care unit on mechanical ventilator for further medical management post foreign body aspiration with respiratory failure and unresponsiveness.  Patient had CT angio PE protocol which was independently reviewed by me ,with no venous pulmonary thromboembolism noted however right lower lobe pneumonia suggestive of aspiration is noted.  Lines/tubes : Airway 7.5 mm (Active)  Secured at (cm) 25 cm 03/16/22 1435  Measured From Lips 03/16/22 1435  Secured Location Left 03/16/22 1435  Secured By [employer] 03/16/22 1435  Cuff Pressure (cm H2O) MOV (Manual Technique) 03/16/22 1435  Site Condition Dry 03/16/22 1435     Urethral Catheter Elana, RN Temperature probe 16 Fr. (Active)  Indication for Insertion or Continuance of Catheter Unstable critically ill patients first 24-48 hours (See Criteria) 03/16/22 1453  Site Assessment Clean, Dry, Intact 03/16/22 1453  Catheter Maintenance Bag below level of bladder;Catheter secured;Drainage bag/tubing not touching floor;Insertion date on drainage bag;No dependent loops;Seal intact 03/16/22 1453  Collection Container Standard drainage bag 03/16/22 1453  Securement Method Securing device (Describe) 03/16/22 1453  Urinary Catheter Interventions (if applicable) Unclamped [id number] 1453  Output (mL) 100 mL 03/16/22 1453    Microbiology/Sepsis markers: Results for orders placed or performed during the hospital encounter of 08/19/21  Urine Culture     Status: Abnormal   Collection Time: 08/19/21  6:54 PM   Specimen: Urine, Random  Result Value Ref Range Status   Specimen Description   Final    URINE, RANDOM Performed at [facility], [address], [place], [place] [address]    Special Requests   Final    NONE Performed at [facility], [address] [place], [place] [address]    Culture >=100,000 COLONIES/mL PROTEUS MIRABILIS (A)  Final   Report Status 08/21/2021 FINAL  Final   Organism ID, Bacteria PROTEUS MIRABILIS (A)  Final      Susceptibility   Proteus mirabilis - MIC*    AMPICILLIN >=32 RESISTANT Resistant     CEFAZOLIN <=4 SENSITIVE Sensitive     CEFEPIME <=0.12 SENSITIVE Sensitive     CEFTRIAXONE <=0.25 SENSITIVE Sensitive     CIPROFLOXACIN >=4 RESISTANT Resistant     GENTAMICIN <=1 SENSITIVE Sensitive     IMIPENEM 1 SENSITIVE Sensitive     NITROFURANTOIN >=512 RESISTANT Resistant     TRIMETH/SULFA >=  320 RESISTANT Resistant     AMPICILLIN/SULBACTAM 8 SENSITIVE Sensitive     PIP/TAZO <=4 SENSITIVE  Sensitive     * >=100,000 COLONIES/mL PROTEUS MIRABILIS    Anti-infectives:  Anti-infectives (From admission, onward)    None       PAST MEDICAL HISTORY   Past Medical History:  Diagnosis Date   Anxiety    Dyslipidemia    Enlarged prostate    Hypertension    Prediabetes    TIA (transient ischemic attack)      SURGICAL HISTORY   Past Surgical History:  Procedure Laterality Date   BACK SURGERY     HERNIA REPAIR     PULMONARY THROMBECTOMY N/A 10/08/2020   Procedure: PULMONARY THROMBECTOMY;  Surgeon: Algernon Huxley, MD;  Location: [facility];  Service: Cardiovascular;  Laterality: N/A;     FAMILY HISTORY   Family History  Problem Relation Age of Onset   Stroke Neg Hx      SOCIAL HISTORY   Social History   Tobacco Use   Smoking status: Never   Smokeless tobacco: Never  Vaping Use   Vaping Use: Never used  Substance Use Topics   Alcohol use: Yes   Drug use: Never     MEDICATIONS   Current Medication:  Current Facility-Administered Medications:    etomidate (AMIDATE) 2 MG/ML injection, , , ,    etomidate (AMIDATE) injection, , Intravenous, Code/Trauma/Sedation Med, Delman Kitten, MD, 30 mg at 03/16/22 1426   fentaNYL 2565mg in NS 2533m(1073mml) infusion-PREMIX, 0-400 mcg/hr, Intravenous, Continuous, WonLucillie GarfinkelD   propofol (DIPRIVAN) 1000 MG/100ML infusion, 5-80 mcg/kg/min, Intravenous, Continuous, Quale, Vipul, MD, Last Rate: 24.5 mL/hr at 03/16/22 1616, 30 mcg/kg/min at 03/16/22 1616   vecuronium (NORCURON) 10 MG injection, , , ,    vecuronium (NORCURON) injection, , Intravenous, Code/Trauma/Sedation Med, QuaDelman KittenD, 12 mcg at 03/16/22 1425  Current Outpatient Medications:    amLODipine (NORVASC) 5 MG tablet, Take 1 tablet (5 mg total) by mouth daily., Disp: 30 tablet, Rfl: 0   Cholecalciferol 125 MCG (5000 UT) TABS, Take 5,000 Units by mouth daily., Disp: , Rfl:    clopidogrel (PLAVIX) 75 MG tablet, Take 75 mg by mouth daily., Disp:  , Rfl:    DULoxetine (CYMBALTA) 60 MG capsule, Take 60 mg by mouth daily., Disp: , Rfl:    lisinopril (ZESTRIL) 20 MG tablet, Take 20 mg by mouth at bedtime., Disp: , Rfl:    sertraline (ZOLOFT) 50 MG tablet, Take 50 mg by mouth daily., Disp: , Rfl:    sitaGLIPtin (JANUVIA) 50 MG tablet, Take 50 mg by mouth daily., Disp: , Rfl:    tamsulosin (FLOMAX) 0.4 MG CAPS capsule, Take 0.8 mg by mouth at bedtime. , Disp: , Rfl:    traMADol (ULTRAM) 50 MG tablet, Take 50 mg by mouth 2 (two) times daily., Disp: , Rfl:     ALLERGIES   Patient has no known allergies.    REVIEW OF SYSTEMS    Unable to obtain due to critical illness with hypoventilation  PHYSICAL EXAMINATION   Vital Signs: Temp:  [95.5 F (35.3 C)-97.7 F (36.5 C)] 96.8 F (36 C) (02/14 1635) Pulse Rate:  [101-144] 101 (02/14 1635) Resp:  [14-50] 25 (02/14 1635) BP: (85-194)/(66-125) 85/66 (02/14 1635) SpO2:  [87 %-100 %] 99 % (02/14 1635) FiO2 (%):  [60 %] 60 % (02/14 1435) Weight:  [136.1 kg] 136.1 kg (02/14 1348)  GENERAL: Age-appropriate no apparent distress on mechanical ventilation HEAD:  Normocephalic, atraumatic.  EYES: Pupils equal, round, reactive to light.  No scleral icterus.  MOUTH: Moist mucosal membrane.  Positive endotracheal tube NECK: Supple. No thyromegaly. No nodules. No JVD.  PULMONARY: Adventitious lung sounds noted with rhonchorous breath sounds worse at the right lower lung zone. CARDIOVASCULAR: S1 and S2. Regular rate and rhythm. No murmurs, rubs, or gallops.  GASTROINTESTINAL: Soft, nontender, non-distended. No masses. Positive bowel sounds. No hepatosplenomegaly.  MUSCULOSKELETAL: No swelling, clubbing, or edema.  NEUROLOGIC: GCS4T SKIN:intact,warm,dry   PERTINENT DATA     Infusions:  fentaNYL infusion INTRAVENOUS     propofol (DIPRIVAN) infusion 30 mcg/kg/min (03/16/22 1616)   Scheduled Medications:  etomidate       vecuronium       PRN Medications: etomidate, etomidate,  vecuronium, vecuronium Hemodynamic parameters:   Intake/Output: No intake/output data recorded.  Ventilator  Settings: Vent Mode: AC FiO2 (%):  [60 %] 60 % Set Rate:  [16 bmp] 16 bmp Vt Set:  [500 mL] 500 mL PEEP:  [5 cmH20] 5 cmH20     LAB RESULTS:  Basic Metabolic Panel: Recent Labs  Lab 03/16/22 1344  NA 137  K 3.9  CL 104  CO2 15*  GLUCOSE 212*  BUN 24*  QUANTITY NOT SUFFICIENT, UNABLE TO PERFORM TEST  CREATININE 1.41*  QUANTITY NOT SUFFICIENT, UNABLE TO PERFORM TEST  CALCIUM 9.1   Liver Function Tests: Recent Labs  Lab 03/16/22 1344  AST 60*  ALT 61*  ALKPHOS 96  BILITOT 0.9  PROT 7.2  ALBUMIN 3.7   Recent Labs  Lab 03/16/22 1344  LIPASE 35   No results for input(s): "AMMONIA" in the last 168 hours. CBC: Recent Labs  Lab 03/16/22 1344  WBC 12.0*  HGB 14.6  HCT 44.6  MCV 89.0  PLT 247   Cardiac Enzymes: No results for input(s): "CKTOTAL", "CKMB", "CKMBINDEX", "TROPONINI" in the last 168 hours. BNP: Invalid input(s): "POCBNP" CBG: No results for input(s): "GLUCAP" in the last 168 hours.     IMAGING RESULTS:  Imaging: CT Angio Chest PE W and/or Wo Contrast  Result Date: 03/16/2022 CLINICAL DATA:  Concern for pulmonary embolism. Respiratory distress. Food stuck in throat: Respiratory distress. Unresponsive EXAM: CT ANGIOGRAPHY CHEST CT ABDOMEN AND PELVIS WITH CONTRAST TECHNIQUE: Multidetector CT imaging of the chest was performed using the standard protocol during bolus administration of intravenous contrast. Multiplanar CT image reconstructions and MIPs were obtained to evaluate the vascular anatomy. Multidetector CT imaging of the abdomen and pelvis was performed using the standard protocol during bolus administration of intravenous contrast. RADIATION DOSE REDUCTION: This exam was performed according to the departmental dose-optimization program which includes automated exposure control, adjustment of the mA and/or kV according to patient  size and/or use of iterative reconstruction technique. CONTRAST:  154m OMNIPAQUE IOHEXOL 350 MG/ML SOLN COMPARISON:  None Available. FINDINGS: CTA CHEST FINDINGS Cardiovascular: No filling defects within the pulmonary arteries to suggest acute pulmonary embolism. Mediastinum/Nodes: Endotracheal tube within the mid trachea. No endobronchial foreign body. No axillary or supraclavicular adenopathy. No mediastinal or hilar adenopathy. No pericardial fluid. Esophagus normal. Relatively hypodense lesion in the LEFT lobe of the thyroid gland measures 18 mm. Lungs/Pleura: Mild basilar atelectasis. No pulmonary infarction. No pleural fluid or pneumonia. Musculoskeletal: A single nondisplaced anterior RIGHT sixth rib fracture (image 101/4) Review of the MIP images confirms the above findings. CT ABDOMEN and PELVIS FINDINGS Lower chest: Lung bases are clear. Hepatobiliary: No focal hepatic lesion. Normal gallbladder. No biliary duct dilatation. Common bile duct is normal.  Pancreas: Pancreas is normal. No ductal dilatation. No pancreatic inflammation. Spleen: Normal spleen Adrenals/urinary tract: Adrenal glands normal. Nonobstructing LEFT renal calculi. No obstructive uropathy. Foley catheter within collapsed bladder. Stomach/Bowel: Stomach, small bowel, appendix, and cecum are normal. The colon and rectosigmoid colon are normal. LEFT Vascular/Lymphatic: Abdominal aorta is normal caliber. No periportal or retroperitoneal adenopathy. No pelvic adenopathy. Reproductive: Prostate unremarkable Other: There is a well-circumscribed fluid collection in the RIGHT ventral peritoneal space measuring 17.5 cm x 10.2 cm. This collection is not changed from CT exam 10/11/2020. There is a wavy hernia mesh centrally within the collection. There is a midline hernia medial to the contain fluid collection contains loop of nonobstructed small bowel (image 67/4) Musculoskeletal: Posterior lumbar fusion and sacral fusion. No acute findings. Review  of the MIP images confirms the above findings. IMPRESSION: CHEST IMPRESSION: 1. No evidence acute pulmonary embolism. 2. No endobronchial foreign body. 3. Mild basilar atelectasis. 4. Single nondisplaced anterior RIGHT sixth rib fracture. 5. Relatively hypodense lesion in the LEFT lobe of the thyroid gland. Recommend non-emergent thyroid ultrasound. Reference: J Am Coll Radiol. 2015 Feb;12(2): 143-50 PELVIS IMPRESSION: 1. No acute findings in the abdomen pelvis. 2. Stable fluid collection in the RIGHT ventral peritoneal space with hernia mesh. 3. Midline ventral hernia contains a loop of nonobstructed small bowel. 4. Nonobstructing LEFT renal calculi. 5. Foley catheter in collapsed bladder. 6. Posterior lumbar fusion and sacral fusion. Electronically Signed   By: Suzy Bouchard M.D.   On: 03/16/2022 16:17   CT ABDOMEN PELVIS W CONTRAST  Result Date: 03/16/2022 CLINICAL DATA:  Concern for pulmonary embolism. Respiratory distress. Food stuck in throat: Respiratory distress. Unresponsive EXAM: CT ANGIOGRAPHY CHEST CT ABDOMEN AND PELVIS WITH CONTRAST TECHNIQUE: Multidetector CT imaging of the chest was performed using the standard protocol during bolus administration of intravenous contrast. Multiplanar CT image reconstructions and MIPs were obtained to evaluate the vascular anatomy. Multidetector CT imaging of the abdomen and pelvis was performed using the standard protocol during bolus administration of intravenous contrast. RADIATION DOSE REDUCTION: This exam was performed according to the departmental dose-optimization program which includes automated exposure control, adjustment of the mA and/or kV according to patient size and/or use of iterative reconstruction technique. CONTRAST:  147m OMNIPAQUE IOHEXOL 350 MG/ML SOLN COMPARISON:  None Available. FINDINGS: CTA CHEST FINDINGS Cardiovascular: No filling defects within the pulmonary arteries to suggest acute pulmonary embolism. Mediastinum/Nodes:  Endotracheal tube within the mid trachea. No endobronchial foreign body. No axillary or supraclavicular adenopathy. No mediastinal or hilar adenopathy. No pericardial fluid. Esophagus normal. Relatively hypodense lesion in the LEFT lobe of the thyroid gland measures 18 mm. Lungs/Pleura: Mild basilar atelectasis. No pulmonary infarction. No pleural fluid or pneumonia. Musculoskeletal: A single nondisplaced anterior RIGHT sixth rib fracture (image 101/4) Review of the MIP images confirms the above findings. CT ABDOMEN and PELVIS FINDINGS Lower chest: Lung bases are clear. Hepatobiliary: No focal hepatic lesion. Normal gallbladder. No biliary duct dilatation. Common bile duct is normal. Pancreas: Pancreas is normal. No ductal dilatation. No pancreatic inflammation. Spleen: Normal spleen Adrenals/urinary tract: Adrenal glands normal. Nonobstructing LEFT renal calculi. No obstructive uropathy. Foley catheter within collapsed bladder. Stomach/Bowel: Stomach, small bowel, appendix, and cecum are normal. The colon and rectosigmoid colon are normal. LEFT Vascular/Lymphatic: Abdominal aorta is normal caliber. No periportal or retroperitoneal adenopathy. No pelvic adenopathy. Reproductive: Prostate unremarkable Other: There is a well-circumscribed fluid collection in the RIGHT ventral peritoneal space measuring 17.5 cm x 10.2 cm. This collection is not changed from CT exam  10/11/2020. There is a wavy hernia mesh centrally within the collection. There is a midline hernia medial to the contain fluid collection contains loop of nonobstructed small bowel (image 67/4) Musculoskeletal: Posterior lumbar fusion and sacral fusion. No acute findings. Review of the MIP images confirms the above findings. IMPRESSION: CHEST IMPRESSION: 1. No evidence acute pulmonary embolism. 2. No endobronchial foreign body. 3. Mild basilar atelectasis. 4. Single nondisplaced anterior RIGHT sixth rib fracture. 5. Relatively hypodense lesion in the LEFT  lobe of the thyroid gland. Recommend non-emergent thyroid ultrasound. Reference: J Am Coll Radiol. 2015 Feb;12(2): 143-50 PELVIS IMPRESSION: 1. No acute findings in the abdomen pelvis. 2. Stable fluid collection in the RIGHT ventral peritoneal space with hernia mesh. 3. Midline ventral hernia contains a loop of nonobstructed small bowel. 4. Nonobstructing LEFT renal calculi. 5. Foley catheter in collapsed bladder. 6. Posterior lumbar fusion and sacral fusion. Electronically Signed   By: Suzy Bouchard M.D.   On: 03/16/2022 16:17   CT Head Wo Contrast  Result Date: 03/16/2022 CLINICAL DATA:  Altered mental status EXAM: CT HEAD WITHOUT CONTRAST TECHNIQUE: Contiguous axial images were obtained from the base of the skull through the vertex without intravenous contrast. RADIATION DOSE REDUCTION: This exam was performed according to the departmental dose-optimization program which includes automated exposure control, adjustment of the mA and/or kV according to patient size and/or use of iterative reconstruction technique. COMPARISON:  Head CT 08/19/2021.  MRI brain 08/19/2021. FINDINGS: Brain: No evidence of acute infarction, hemorrhage, hydrocephalus, extra-axial collection or mass lesion/mass effect. Again seen is mild diffuse atrophy. There is moderate periventricular deep white matter hypodensity similar to the prior study. Vascular: No hyperdense vessel or unexpected calcification. Skull: Normal. Negative for fracture or focal lesion. Sinuses/Orbits: No acute finding. Other: None. IMPRESSION: 1. No acute intracranial abnormality. 2. Stable mild diffuse atrophy and moderate periventricular deep white matter hypodensity, likely due to chronic small vessel ischemia. Electronically Signed   By: Ronney Asters M.D.   On: 03/16/2022 16:04   DG Chest Portable 1 View  Result Date: 03/16/2022 CLINICAL DATA:  Intubation EXAM: PORTABLE CHEST 1 VIEW COMPARISON:  Earlier 03/16/2022 FINDINGS: Interval placement of ET tube  with tip 4.5 cm above the carina. Underinflation. No consolidation, pneumothorax or effusion. Enlarged cardiopericardial silhouette without edema. Bronchovascular crowding. Tortuous aorta. Overlapping cardiac leads. IMPRESSION: 1. Interval placement of ET tube with tip 4.5 cm above the carina. 2. Underinflation with bronchovascular crowding. Electronically Signed   By: Jill Side M.D.   On: 03/16/2022 15:04   DG Chest Portable 1 View  Result Date: 03/16/2022 CLINICAL DATA:  Choked on steak. EXAM: PORTABLE CHEST 1 VIEW COMPARISON:  Chest x-ray dated October 12, 2020. FINDINGS: The heart size and mediastinal contours are within normal limits. Both lungs are clear. The visualized skeletal structures are unremarkable. IMPRESSION: No active disease. Electronically Signed   By: Titus Dubin M.D.   On: 03/16/2022 14:20   @PROBHOSP$ @ CT Angio Chest PE W and/or Wo Contrast  Result Date: 03/16/2022 CLINICAL DATA:  Concern for pulmonary embolism. Respiratory distress. Food stuck in throat: Respiratory distress. Unresponsive EXAM: CT ANGIOGRAPHY CHEST CT ABDOMEN AND PELVIS WITH CONTRAST TECHNIQUE: Multidetector CT imaging of the chest was performed using the standard protocol during bolus administration of intravenous contrast. Multiplanar CT image reconstructions and MIPs were obtained to evaluate the vascular anatomy. Multidetector CT imaging of the abdomen and pelvis was performed using the standard protocol during bolus administration of intravenous contrast. RADIATION DOSE REDUCTION: This exam was performed  according to the departmental dose-optimization program which includes automated exposure control, adjustment of the mA and/or kV according to patient size and/or use of iterative reconstruction technique. CONTRAST:  165m OMNIPAQUE IOHEXOL 350 MG/ML SOLN COMPARISON:  None Available. FINDINGS: CTA CHEST FINDINGS Cardiovascular: No filling defects within the pulmonary arteries to suggest acute pulmonary  embolism. Mediastinum/Nodes: Endotracheal tube within the mid trachea. No endobronchial foreign body. No axillary or supraclavicular adenopathy. No mediastinal or hilar adenopathy. No pericardial fluid. Esophagus normal. Relatively hypodense lesion in the LEFT lobe of the thyroid gland measures 18 mm. Lungs/Pleura: Mild basilar atelectasis. No pulmonary infarction. No pleural fluid or pneumonia. Musculoskeletal: A single nondisplaced anterior RIGHT sixth rib fracture (image 101/4) Review of the MIP images confirms the above findings. CT ABDOMEN and PELVIS FINDINGS Lower chest: Lung bases are clear. Hepatobiliary: No focal hepatic lesion. Normal gallbladder. No biliary duct dilatation. Common bile duct is normal. Pancreas: Pancreas is normal. No ductal dilatation. No pancreatic inflammation. Spleen: Normal spleen Adrenals/urinary tract: Adrenal glands normal. Nonobstructing LEFT renal calculi. No obstructive uropathy. Foley catheter within collapsed bladder. Stomach/Bowel: Stomach, small bowel, appendix, and cecum are normal. The colon and rectosigmoid colon are normal. LEFT Vascular/Lymphatic: Abdominal aorta is normal caliber. No periportal or retroperitoneal adenopathy. No pelvic adenopathy. Reproductive: Prostate unremarkable Other: There is a well-circumscribed fluid collection in the RIGHT ventral peritoneal space measuring 17.5 cm x 10.2 cm. This collection is not changed from CT exam 10/11/2020. There is a wavy hernia mesh centrally within the collection. There is a midline hernia medial to the contain fluid collection contains loop of nonobstructed small bowel (image 67/4) Musculoskeletal: Posterior lumbar fusion and sacral fusion. No acute findings. Review of the MIP images confirms the above findings. IMPRESSION: CHEST IMPRESSION: 1. No evidence acute pulmonary embolism. 2. No endobronchial foreign body. 3. Mild basilar atelectasis. 4. Single nondisplaced anterior RIGHT sixth rib fracture. 5. Relatively  hypodense lesion in the LEFT lobe of the thyroid gland. Recommend non-emergent thyroid ultrasound. Reference: J Am Coll Radiol. 2015 Feb;12(2): 143-50 PELVIS IMPRESSION: 1. No acute findings in the abdomen pelvis. 2. Stable fluid collection in the RIGHT ventral peritoneal space with hernia mesh. 3. Midline ventral hernia contains a loop of nonobstructed small bowel. 4. Nonobstructing LEFT renal calculi. 5. Foley catheter in collapsed bladder. 6. Posterior lumbar fusion and sacral fusion. Electronically Signed   By: SSuzy BouchardM.D.   On: 03/16/2022 16:17   CT ABDOMEN PELVIS W CONTRAST  Result Date: 03/16/2022 CLINICAL DATA:  Concern for pulmonary embolism. Respiratory distress. Food stuck in throat: Respiratory distress. Unresponsive EXAM: CT ANGIOGRAPHY CHEST CT ABDOMEN AND PELVIS WITH CONTRAST TECHNIQUE: Multidetector CT imaging of the chest was performed using the standard protocol during bolus administration of intravenous contrast. Multiplanar CT image reconstructions and MIPs were obtained to evaluate the vascular anatomy. Multidetector CT imaging of the abdomen and pelvis was performed using the standard protocol during bolus administration of intravenous contrast. RADIATION DOSE REDUCTION: This exam was performed according to the departmental dose-optimization program which includes automated exposure control, adjustment of the mA and/or kV according to patient size and/or use of iterative reconstruction technique. CONTRAST:  1016mOMNIPAQUE IOHEXOL 350 MG/ML SOLN COMPARISON:  None Available. FINDINGS: CTA CHEST FINDINGS Cardiovascular: No filling defects within the pulmonary arteries to suggest acute pulmonary embolism. Mediastinum/Nodes: Endotracheal tube within the mid trachea. No endobronchial foreign body. No axillary or supraclavicular adenopathy. No mediastinal or hilar adenopathy. No pericardial fluid. Esophagus normal. Relatively hypodense lesion in the LEFT lobe of the  thyroid gland  measures 18 mm. Lungs/Pleura: Mild basilar atelectasis. No pulmonary infarction. No pleural fluid or pneumonia. Musculoskeletal: A single nondisplaced anterior RIGHT sixth rib fracture (image 101/4) Review of the MIP images confirms the above findings. CT ABDOMEN and PELVIS FINDINGS Lower chest: Lung bases are clear. Hepatobiliary: No focal hepatic lesion. Normal gallbladder. No biliary duct dilatation. Common bile duct is normal. Pancreas: Pancreas is normal. No ductal dilatation. No pancreatic inflammation. Spleen: Normal spleen Adrenals/urinary tract: Adrenal glands normal. Nonobstructing LEFT renal calculi. No obstructive uropathy. Foley catheter within collapsed bladder. Stomach/Bowel: Stomach, small bowel, appendix, and cecum are normal. The colon and rectosigmoid colon are normal. LEFT Vascular/Lymphatic: Abdominal aorta is normal caliber. No periportal or retroperitoneal adenopathy. No pelvic adenopathy. Reproductive: Prostate unremarkable Other: There is a well-circumscribed fluid collection in the RIGHT ventral peritoneal space measuring 17.5 cm x 10.2 cm. This collection is not changed from CT exam 10/11/2020. There is a wavy hernia mesh centrally within the collection. There is a midline hernia medial to the contain fluid collection contains loop of nonobstructed small bowel (image 67/4) Musculoskeletal: Posterior lumbar fusion and sacral fusion. No acute findings. Review of the MIP images confirms the above findings. IMPRESSION: CHEST IMPRESSION: 1. No evidence acute pulmonary embolism. 2. No endobronchial foreign body. 3. Mild basilar atelectasis. 4. Single nondisplaced anterior RIGHT sixth rib fracture. 5. Relatively hypodense lesion in the LEFT lobe of the thyroid gland. Recommend non-emergent thyroid ultrasound. Reference: J Am Coll Radiol. 2015 Feb;12(2): 143-50 PELVIS IMPRESSION: 1. No acute findings in the abdomen pelvis. 2. Stable fluid collection in the RIGHT ventral peritoneal space with  hernia mesh. 3. Midline ventral hernia contains a loop of nonobstructed small bowel. 4. Nonobstructing LEFT renal calculi. 5. Foley catheter in collapsed bladder. 6. Posterior lumbar fusion and sacral fusion. Electronically Signed   By: Suzy Bouchard M.D.   On: 03/16/2022 16:17   CT Head Wo Contrast  Result Date: 03/16/2022 CLINICAL DATA:  Altered mental status EXAM: CT HEAD WITHOUT CONTRAST TECHNIQUE: Contiguous axial images were obtained from the base of the skull through the vertex without intravenous contrast. RADIATION DOSE REDUCTION: This exam was performed according to the departmental dose-optimization program which includes automated exposure control, adjustment of the mA and/or kV according to patient size and/or use of iterative reconstruction technique. COMPARISON:  Head CT 08/19/2021.  MRI brain 08/19/2021. FINDINGS: Brain: No evidence of acute infarction, hemorrhage, hydrocephalus, extra-axial collection or mass lesion/mass effect. Again seen is mild diffuse atrophy. There is moderate periventricular deep white matter hypodensity similar to the prior study. Vascular: No hyperdense vessel or unexpected calcification. Skull: Normal. Negative for fracture or focal lesion. Sinuses/Orbits: No acute finding. Other: None. IMPRESSION: 1. No acute intracranial abnormality. 2. Stable mild diffuse atrophy and moderate periventricular deep white matter hypodensity, likely due to chronic small vessel ischemia. Electronically Signed   By: Ronney Asters M.D.   On: 03/16/2022 16:04   DG Chest Portable 1 View  Result Date: 03/16/2022 CLINICAL DATA:  Intubation EXAM: PORTABLE CHEST 1 VIEW COMPARISON:  Earlier 03/16/2022 FINDINGS: Interval placement of ET tube with tip 4.5 cm above the carina. Underinflation. No consolidation, pneumothorax or effusion. Enlarged cardiopericardial silhouette without edema. Bronchovascular crowding. Tortuous aorta. Overlapping cardiac leads. IMPRESSION: 1. Interval placement of  ET tube with tip 4.5 cm above the carina. 2. Underinflation with bronchovascular crowding. Electronically Signed   By: Jill Side M.D.   On: 03/16/2022 15:04   DG Chest Portable 1 View  Result Date: 03/16/2022 CLINICAL DATA:  Choked on steak. EXAM: PORTABLE CHEST 1 VIEW COMPARISON:  Chest x-ray dated October 12, 2020. FINDINGS: The heart size and mediastinal contours are within normal limits. Both lungs are clear. The visualized skeletal structures are unremarkable. IMPRESSION: No active disease. Electronically Signed   By: Titus Dubin M.D.   On: 03/16/2022 14:20     ASSESSMENT AND PLAN    -Multidisciplinary rounds held today  Altered mental status with encephalopathy    Post respiratory distress with aspiration pneumonia    -Patient currently mechanically ventilated on PRVC -continue Full MV support -continue Bronchodilator Therapy -Wean Fio2 and PEEP as tolerated -will perform SAT/SBT when respiratory parameters are met -Unasyn for aspiration pneumonia   Aspiration of foreign body -EMS apparently were able to remove some food material including pieces of steak -Right lower lobe infiltrates noted suggestive of aspiration -Patient may require bronchoscopic airway inspection with foreign body aspiration, bronchial washings and microbiology with BAL -Will order COVID-19/RSV/influenza panel -CRP and procalcitonin  Renal Failure-most likely due t transient hypotension -follow chem 7 -follow UO -continue Foley Catheter-assess need daily   Severe Parkinson's and dementia -Neurology consultation-appreciate input - minimal sedation to achieve a RASS goal: -1 Wake up assessment pending   ID -continue IV abx as prescibed -follow up cultures  GI/Nutrition GI PROPHYLAXIS as indicated DIET-->TF's as tolerated Constipation protocol as indicated  ENDO - ICU hypoglycemic\Hyperglycemia protocol -check FSBS per protocol   ELECTROLYTES -follow labs as needed -replace as  needed -pharmacy consultation   DVT/GI PRX ordered -SCDs  TRANSFUSIONS AS NEEDED MONITOR FSBS ASSESS the need for LABS as needed    Critical care provider statement:   Total critical care time: 109 minutes   Performed by: Lanney Gins MD   Critical care time was exclusive of separately billable procedures and treating other patients.   Critical care was necessary to treat or prevent imminent or life-threatening deterioration.   Critical care was time spent personally by me on the following activities: development of treatment plan with patient and/or surrogate as well as nursing, discussions with consultants, evaluation of patient's response to treatment, examination of patient, obtaining history from patient or surrogate, ordering and performing treatments and interventions, ordering and review of laboratory studies, ordering and review of radiographic studies, pulse oximetry and re-evaluation of patient's condition.      This document was prepared using Dragon voice recognition software and may include unintentional dictation errors.    Ottie Glazier, M.D.  Division of [place]

## 2022-03-16 NOTE — Progress Notes (Signed)
Received from ed.  Placed on monitor with ivs infusing as noted. Foley in place, og placed with aspirate and air bolus noted.  Awaiting abd xray

## 2022-03-16 NOTE — Consult Note (Addendum)
NEUROLOGY CONSULTATION NOTE   Date of service: March 16, 2022 Patient Name: Keith Carter MRN:  [id number] DOB:  1955/12/18 Reason for consult: posturing after respiratory arrest Requesting physician: Dr. Delman Kitten _ _ _   _ __   _ __ _ _  __ __   _ __   __ _  History of Present Illness   This is a 67 yo man with hx dementia, severe Parkinsons, prior CVA, uncontrolled HTN, massive PE last year, DM2 who was BIB EMS after choking on a large piece of steak lodged in his airway (removed in the field). He remained unresponsive upon arrival to ED and was noted to have decerebrate posturing. He required intubation for further mgmt of post foreign body aspiration with respiratory failure and unresponsiveness. Neurology consulted 2/2 c/f anoxic brain injury. He suffered respiratory arrest but not cardiac.  Head CT in ED - no acute process on personal review   ROS   UTA 2/2 comatose  Past History   I have reviewed the following:  Past Medical History:  Diagnosis Date   Anxiety    Dyslipidemia    Enlarged prostate    Hypertension    Prediabetes    TIA (transient ischemic attack)    Past Surgical History:  Procedure Laterality Date   BACK SURGERY     HERNIA REPAIR     PULMONARY THROMBECTOMY N/A 10/08/2020   Procedure: PULMONARY THROMBECTOMY;  Surgeon: Algernon Huxley, MD;  Location: [facility];  Service: Cardiovascular;  Laterality: N/A;   Family History  Problem Relation Age of Onset   Stroke Neg Hx    Social History   Socioeconomic History   Marital status: Married    Spouse name: Not on file   Number of children: Not on file   Years of education: Not on file   Highest education level: Not on file  Occupational History   Occupation: disabled due to back issues  Tobacco Use   Smoking status: Never   Smokeless tobacco: Never  Vaping Use   Vaping Use: Never used  Substance and Sexual Activity   Alcohol use: Yes   Drug use: Never   Sexual activity: Not on file   Other Topics Concern   Not on file  Social History Narrative   Not on file   Social Determinants of Health   Financial Resource Strain: Not on file  Food Insecurity: Not on file  Transportation Needs: Not on file  Physical Activity: Not on file  Stress: Not on file  Social Connections: Not on file   No Known Allergies  Medications   (Not in a hospital admission)     Current Facility-Administered Medications:    etomidate (AMIDATE) 2 MG/ML injection, , , ,    etomidate (AMIDATE) injection, , Intravenous, Code/Trauma/Sedation Med, Delman Kitten, MD, 30 mg at 03/16/22 1426   lactated ringers bolus 1,000 mL, 1,000 mL, Intravenous, Once, Delman Kitten, MD   propofol (DIPRIVAN) 1000 MG/100ML infusion, 5-80 mcg/kg/min, Intravenous, Continuous, Quale, Tyliek, MD, Last Rate: 16.33 mL/hr at 03/16/22 1446, 20 mcg/kg/min at 03/16/22 1446   vecuronium (NORCURON) 10 MG injection, , , ,    vecuronium (NORCURON) injection, , Intravenous, Code/Trauma/Sedation Med, Delman Kitten, MD, 12 mcg at 03/16/22 1425  Current Outpatient Medications:    amLODipine (NORVASC) 5 MG tablet, Take 1 tablet (5 mg total) by mouth daily., Disp: 30 tablet, Rfl: 0   Cholecalciferol 125 MCG (5000 UT) TABS, Take 5,000 Units by mouth daily.,  Disp: , Rfl:    clopidogrel (PLAVIX) 75 MG tablet, Take 75 mg by mouth daily., Disp: , Rfl:    DULoxetine (CYMBALTA) 60 MG capsule, Take 60 mg by mouth daily., Disp: , Rfl:    lisinopril (ZESTRIL) 20 MG tablet, Take 20 mg by mouth at bedtime., Disp: , Rfl:    sertraline (ZOLOFT) 50 MG tablet, Take 50 mg by mouth daily., Disp: , Rfl:    sitaGLIPtin (JANUVIA) 50 MG tablet, Take 50 mg by mouth daily., Disp: , Rfl:    tamsulosin (FLOMAX) 0.4 MG CAPS capsule, Take 0.8 mg by mouth at bedtime. , Disp: , Rfl:    traMADol (ULTRAM) 50 MG tablet, Take 50 mg by mouth 2 (two) times daily., Disp: , Rfl:   Vitals   Vitals:   03/16/22 1455 03/16/22 1457 03/16/22 1500 03/16/22 1505  BP: (!) 168/109   (!) 150/102 (!) 155/105  Pulse: (!) 125  (!) 121 (!) 122  Resp:      Temp:  (!) 96.6 F (35.9 C) (!) 96.8 F (36 C) (!) 97.1 F (36.2 C)  TempSrc:      SpO2: 98%  98% 98%  Weight:      Height:         Body mass index is 40.69 kg/m.  Physical Exam   Exam  Vitals:   03/16/22 1640 03/16/22 1645  BP: (!) 66/56 (!) 63/53  Pulse: 95 90  Resp: (!) 33 (!) 21  Temp: (!) 96.8 F (36 C) (!) 96.9 F (36.1 C)  SpO2: 99% 97%   Propofol paused x10 min prior to exam  Gen: patient lying in bed, comatose CV: extremities appear well-perfused Resp: ventilated  Neurologic exam MS: comatose, no response to noxious stimuli, does not follow commands Speech: intubated CN: PERRL, (+) corneals, oculocephalics, cough, gag Motor & sensory: no response to noxious stimuli Reflexes: 1+ symm with toes down bilat Coordination, gait: UTA   Labs   CBC:  Recent Labs  Lab 03/16/22 1344  WBC 12.0*  HGB 14.6  HCT 44.6  MCV 89.0  PLT [id number]    Basic Metabolic Panel:  Lab Results  Component Value Date   NA 137 03/16/2022   K 3.9 03/16/2022   CO2 15 (L) 03/16/2022   GLUCOSE 212 (H) 03/16/2022   BUN QUANTITY NOT SUFFICIENT, UNABLE TO PERFORM TEST 03/16/2022   BUN 24 (H) 03/16/2022   CREATININE QUANTITY NOT SUFFICIENT, UNABLE TO PERFORM TEST 03/16/2022   CREATININE 1.41 (H) 03/16/2022   CALCIUM 9.1 03/16/2022   GFRNONAA NOT CALCULATED 03/16/2022   GFRNONAA 54 (L) 03/16/2022   GFRAA 78 03/16/2020   Lipid Panel:  Lab Results  Component Value Date   LDLCALC 54 09/29/2019   HgbA1c:  Lab Results  Component Value Date   HGBA1C 9.0 (H) 10/07/2020   Urine Drug Screen:     Component Value Date/Time   LABOPIA NONE DETECTED 10/05/2020 0227   LABOPIA NONE DETECTED 09/28/2019 1906   COCAINSCRNUR NONE DETECTED 10/05/2020 0227   LABBENZ NONE DETECTED 10/05/2020 0227   LABBENZ NONE DETECTED 09/28/2019 1906   AMPHETMU NONE DETECTED 10/05/2020 0227   AMPHETMU NONE DETECTED 09/28/2019 1906    THCU NONE DETECTED 10/05/2020 0227   THCU NONE DETECTED 09/28/2019 1906   LABBARB NONE DETECTED 10/05/2020 0227   LABBARB NONE DETECTED 09/28/2019 1906    Alcohol Level No results found for: "ETH"   Impression   This is a 67 yo man with hx dementia, severe Parkinsons, prior CVA,  uncontrolled HTN, massive PE last year, DM2 who was BIB EMS after choking on a large piece of steak lodged in his airway (removed in the field). He remained unresponsive upon arrival to ED and was noted to have decerebrate posturing. He required intubation for respiratory failure and unresponsiveness. Neurology consulted 2/2 c/f anoxic brain injury. Head CT in ED - no acute process on personal review. His brainstem reflexes are intact on exam but he does not wake up or follow commands when sedation is briefly paused. It is too soon to tell whether he suffered anoxic brain injury but would recommend the following studies to inform prognostication if he does not start waking up and following commands.  Recommendations   -rEEG tomorrow - MRI brain in 48 hrs - Will continue to follow  This patient is critically ill and at significant risk of neurological worsening, death and care requires constant monitoring of vital signs, hemodynamics,respiratory and cardiac monitoring, neurological assessment, discussion with family, other specialists and medical decision making of high complexity. I spent 45 minutes of neurocritical care time  in the care of  this patient. This was time spent independent of any time provided by nurse practitioner or PA.  Su Monks, MD [facility] [phone]  If 7pm- 7am, please page neurology on call as listed in [place].  **Any copied and pasted documentation in this note was written by me in another application not billed for and pasted by me into this document.

## 2022-03-16 NOTE — ED Notes (Signed)
Called CT who states room 3 will be freed up in 10 minutes.

## 2022-03-17 DIAGNOSIS — R4182 Altered mental status, unspecified: Secondary | ICD-10-CM

## 2022-03-17 LAB — PHOSPHORUS: Phosphorus: 4 mg/dL (ref 2.5–4.6)

## 2022-03-17 LAB — HEPATIC FUNCTION PANEL
ALT: 43 U/L (ref 0–44)
AST: 36 U/L (ref 15–41)
Albumin: 3 g/dL — ABNORMAL LOW (ref 3.5–5.0)
Alkaline Phosphatase: 74 U/L (ref 38–126)
Bilirubin, Direct: 0.2 mg/dL (ref 0.0–0.2)
Indirect Bilirubin: 0.7 mg/dL (ref 0.3–0.9)
Total Bilirubin: 0.9 mg/dL (ref 0.3–1.2)
Total Protein: 5.8 g/dL — ABNORMAL LOW (ref 6.5–8.1)

## 2022-03-17 LAB — BLOOD CULTURE ID PANEL (REFLEXED) - BCID2

## 2022-03-17 LAB — BLOOD GAS, ARTERIAL
Acid-Base Excess: 1.7 mmol/L (ref 0.0–2.0)
Bicarbonate: 26.6 mmol/L (ref 20.0–28.0)
FIO2: 40 %
MECHVT: 500 mL
Mechanical Rate: 16
O2 Saturation: 99.9 %
PEEP: 5 cmH2O
pCO2 arterial: 42 mmHg (ref 32–48)
pH, Arterial: 7.41 (ref 7.35–7.45)
pO2, Arterial: 119 mmHg — ABNORMAL HIGH (ref 83–108)

## 2022-03-17 LAB — CBC WITH DIFFERENTIAL/PLATELET
Abs Immature Granulocytes: 0.11 10*3/uL — ABNORMAL HIGH (ref 0.00–0.07)
Basophils Absolute: 0.1 10*3/uL (ref 0.0–0.1)
Basophils Relative: 1 %
Eosinophils Absolute: 0.1 10*3/uL (ref 0.0–0.5)
Eosinophils Relative: 1 %
HCT: 38.4 % — ABNORMAL LOW (ref 39.0–52.0)
Hemoglobin: 12.3 g/dL — ABNORMAL LOW (ref 13.0–17.0)
Immature Granulocytes: 1 %
Lymphocytes Relative: 9 %
Lymphs Abs: 1.4 10*3/uL (ref 0.7–4.0)
MCH: 28.8 pg (ref 26.0–34.0)
MCHC: 32 g/dL (ref 30.0–36.0)
MCV: 89.9 fL (ref 80.0–100.0)
Monocytes Absolute: 1.4 10*3/uL — ABNORMAL HIGH (ref 0.1–1.0)
Monocytes Relative: 9 %
Neutro Abs: 12.1 10*3/uL — ABNORMAL HIGH (ref 1.7–7.7)
Neutrophils Relative %: 79 %
Platelets: 239 10*3/uL (ref 150–400)
RBC: 4.27 MIL/uL (ref 4.22–5.81)
RDW: 13.7 % (ref 11.5–15.5)
WBC: 15.1 10*3/uL — ABNORMAL HIGH (ref 4.0–10.5)
nRBC: 0 % (ref 0.0–0.2)

## 2022-03-17 LAB — LACTIC ACID, PLASMA
Lactic Acid, Venous: 3.5 mmol/L (ref 0.5–1.9)
Lactic Acid, Venous: 1.5 mmol/L (ref 0.5–1.9)

## 2022-03-17 LAB — BASIC METABOLIC PANEL
Anion gap: 8 (ref 5–15)
BUN: 27 mg/dL — ABNORMAL HIGH (ref 8–23)
CO2: 25 mmol/L (ref 22–32)
Calcium: 8.5 mg/dL — ABNORMAL LOW (ref 8.9–10.3)
Chloride: 103 mmol/L (ref 98–111)
Creatinine, Ser: 1.33 mg/dL — ABNORMAL HIGH (ref 0.61–1.24)
GFR, Estimated: 58 mL/min — ABNORMAL LOW (ref >60)
Glucose, Bld: 144 mg/dL — ABNORMAL HIGH (ref 70–99)
Potassium: 4.2 mmol/L (ref 3.5–5.1)
Sodium: 136 mmol/L (ref 135–145)

## 2022-03-17 LAB — GLUCOSE, CAPILLARY
Glucose-Capillary: 134 mg/dL — ABNORMAL HIGH (ref 70–99)
Glucose-Capillary: 121 mg/dL — ABNORMAL HIGH (ref 70–99)
Glucose-Capillary: 93 mg/dL (ref 70–99)
Glucose-Capillary: 84 mg/dL (ref 70–99)
Glucose-Capillary: 115 mg/dL — ABNORMAL HIGH (ref 70–99)
Glucose-Capillary: 116 mg/dL — ABNORMAL HIGH (ref 70–99)

## 2022-03-17 LAB — HEMOGLOBIN A1C
Hgb A1c MFr Bld: 6.5 % — ABNORMAL HIGH (ref 4.8–5.6)
Mean Plasma Glucose: 139.85 mg/dL

## 2022-03-17 LAB — PROCALCITONIN: Procalcitonin: 4.77 ng/mL

## 2022-03-17 LAB — HIV ANTIBODY (ROUTINE TESTING W REFLEX): HIV Screen 4th Generation wRfx: NONREACTIVE

## 2022-03-17 LAB — TRIGLYCERIDES: Triglycerides: 41 mg/dL (ref <150)

## 2022-03-17 LAB — MAGNESIUM: Magnesium: 1.8 mg/dL (ref 1.7–2.4)

## 2022-03-17 MED ORDER — SERTRALINE HCL 50 MG PO TABS: 50.0000 mg | ORAL_TABLET | Freq: Every day | ORAL | Status: DC

## 2022-03-17 MED ORDER — ACETAMINOPHEN 650 MG RE SUPP: 650.0000 mg | RECTAL | Status: DC | PRN

## 2022-03-17 MED ORDER — KETOROLAC TROMETHAMINE 15 MG/ML IJ SOLN
15.0000 mg | Freq: Once | INTRAMUSCULAR | Status: AC
  Administered 2022-03-17: 15 mg via INTRAVENOUS
  Filled 2022-03-17: qty 1

## 2022-03-17 MED ORDER — FENTANYL CITRATE PF 50 MCG/ML IJ SOSY: 12.5000 ug | PREFILLED_SYRINGE | INTRAMUSCULAR | Status: DC | PRN

## 2022-03-17 NOTE — Progress Notes (Signed)
Pt was extubated at 1620. Pt tolerated well. Pt is on 2L o2.

## 2022-03-17 NOTE — IPAL (Signed)
  Interdisciplinary Goals of Care Family Meeting   Date carried out: 03/17/2022  Location of the meeting: Bedside  Member's involved: Nurse Practitioner, Bedside Registered Nurse, and Family Member or next of kin  Durable Power of Attorney or acting medical decision maker: Warehouse manager  Discussion: We discussed goals of care for Keith Carter the family had been discussing goals of care outpatient and had decided on DNR/DNI which was reversed to FULL code status due to the patient's acute seeming aspiration event. The patient, now extubated, is determined to be a high risk for continued aspiration events. Family is in agreement to change status to DNR/DNI.  Code status:   Code Status: DNR   Disposition: Continue current acute care  Time spent for the meeting: 15 minutes    Renato Battles, NP  03/17/2022, 7:19 PM  Domingo Pulse [place], AGACNP-BC Acute Care Nurse Practitioner [name]   [phone] / [phone] Please see Amion for pager details.

## 2022-03-17 NOTE — Progress Notes (Signed)
Initial Nutrition Assessment  DOCUMENTATION CODES:   Obesity unspecified  INTERVENTION:   If tube feeds initiated, recommend:  Vital HP '@65ml'$ /hr- Initiate at 87m/hr and advance by 127mhr q 8 hours until goal rate is reached.   ProSource TF 20- Give 6062maily via tube, each supplement provides 80kcal and 20g of protein.   Free water flushes 71m91m hours to maintain tube patency   Regimen provides 1640kcal/day, 157g/day protein and 1484ml40m of free water.   Pt at mild to moderate refeed risk; recommend monitor potassium, magnesium and phosphorus labs daily until stable  Daily weights   NUTRITION DIAGNOSIS:   Inadequate oral intake related to inability to eat (pt sedated and ventilated) as evidenced by NPO status.  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  Pt sedated and ventilated. OGT in place. Plan is for bronchoscopy today and possible extubation. Pt will need SLP evaluation prior to diet initiation. Will plan to initiate tube feeds if pt does not extubate. Per chart, pt appears weight stable at baseline.   Medications reviewed and include: plavix, colace, pepcid, heparin, insulin, miralax, unasyn, levophed  Labs reviewed: K 4.2 wnl, BUN 27(H), creat 1.33(H). P 4.0 wnl, Mg 1.8 wnl BNP 106.7(H)- 2/14 Wbc- 15.1(H) Cbgs- 93, 121, 134 x 24 hrs  AIC 6.5(H)- 2/14  Patient is currently  intubated on ventilator support MV: 8.0 L/min Temp (24hrs), Avg:97.4 F (36.3 C), Min:95.5 F (35.3 C), Max:98.6 F (37 C)  Propofol: stopped   MAP- >65mmH82mUOP- 600ml  16mRITION - FOCUSED PHYSICAL EXAM:  Flowsheet Row Most Recent Value  Orbital Region No depletion  Upper Arm Region Mild depletion  Thoracic and Lumbar Region No depletion  Buccal Region No depletion  Temple Region Mild depletion  Clavicle Bone Region Mild depletion  Clavicle and Acromion Bone Region Mild depletion  Scapular Bone Region No depletion  Dorsal Hand No depletion  Patellar Region No depletion  Anterior Thigh Region No depletion  Posterior Calf Region No depletion  Edema (RD Assessment) Moderate  Hair Reviewed  Eyes Reviewed  Mouth Reviewed  Skin Reviewed  Nails Reviewed   Diet Order:   Diet Order             Diet NPO time specified  Diet effective now                  EDUCATION NEEDS:   No education needs have been identified at this time  Skin:  Skin Assessment: Reviewed RN Assessment  Last BM:  pta  Height:   Ht Readings from Last 1 Encounters:  03/16/22 6' (1.829 m)    Weight:   Wt Readings from Last 1 Encounters:  03/17/22 112.4 kg    Ideal Body Weight:  80.9 kg  BMI:  Body mass index is 33.61 kg/m.  Estimated Nutritional Needs:   Kcal:  1236-1574kcal/day  Protein:  >160g/day  Fluid:  2.1-2.4L/day  Blayke Cordrey CKoleen Distance, LDN Please refer to [facility]ION [facility] and/or RD on-call/weekend/after  hours pager

## 2022-03-17 NOTE — Consult Note (Signed)
PHARMACY - PHYSICIAN COMMUNICATION CRITICAL VALUE ALERT - BLOOD CULTURE IDENTIFICATION (BCID)  Keith Carter is an 67 y.o. male who presented to [facility] on 03/16/2022 with a chief complaint of unresponsiveness.  Assessment: 1 out of 4 bottles growing GPC. BCID detects Staph epidermidis without resistance detected.  Name of physician (or Provider) Contacted: Ottie Glazier, MD  Current antibiotics: Unasyn 3g IV Q6H  Changes to prescribed antibiotics recommended:  Patient is on recommended antibiotics - No changes needed  Results for orders placed or performed during the hospital encounter of 03/16/22  Blood Culture ID Panel (Reflexed) (Collected: 03/16/2022  1:44 PM)  Result Value Ref Range   Enterococcus faecalis NOT DETECTED NOT DETECTED   Enterococcus Faecium NOT DETECTED NOT DETECTED   Listeria monocytogenes NOT DETECTED NOT DETECTED   Staphylococcus species DETECTED (A) NOT DETECTED   Staphylococcus aureus (BCID) NOT DETECTED NOT DETECTED   Staphylococcus epidermidis DETECTED (A) NOT DETECTED   Staphylococcus lugdunensis NOT DETECTED NOT DETECTED   Streptococcus species NOT DETECTED NOT DETECTED   Streptococcus agalactiae NOT DETECTED NOT DETECTED   Streptococcus pneumoniae NOT DETECTED NOT DETECTED   Streptococcus pyogenes NOT DETECTED NOT DETECTED   A.calcoaceticus-baumannii NOT DETECTED NOT DETECTED   Bacteroides fragilis NOT DETECTED NOT DETECTED   Enterobacterales NOT DETECTED NOT DETECTED   Enterobacter cloacae complex NOT DETECTED NOT DETECTED   Escherichia coli NOT DETECTED NOT DETECTED   Klebsiella aerogenes NOT DETECTED NOT DETECTED   Klebsiella oxytoca NOT DETECTED NOT DETECTED   Klebsiella pneumoniae NOT DETECTED NOT DETECTED   Proteus species NOT DETECTED NOT DETECTED   Salmonella species NOT DETECTED NOT DETECTED   Serratia marcescens NOT DETECTED NOT DETECTED   Haemophilus influenzae NOT DETECTED NOT DETECTED   Neisseria meningitidis NOT DETECTED NOT  DETECTED   Pseudomonas aeruginosa NOT DETECTED NOT DETECTED   Stenotrophomonas maltophilia NOT DETECTED NOT DETECTED   Candida albicans NOT DETECTED NOT DETECTED   Candida auris NOT DETECTED NOT DETECTED   Candida glabrata NOT DETECTED NOT DETECTED   Candida krusei NOT DETECTED NOT DETECTED   Candida parapsilosis NOT DETECTED NOT DETECTED   Candida tropicalis NOT DETECTED NOT DETECTED   Cryptococcus neoformans/gattii NOT DETECTED NOT DETECTED   Methicillin resistance mecA/C NOT DETECTED NOT DETECTED   Gretel Acre, PharmD PGY1 Pharmacy Resident 03/17/2022 7:44 PM

## 2022-03-17 NOTE — Progress Notes (Signed)
Eeg done 

## 2022-03-17 NOTE — Procedures (Signed)
PROCEDURE: BRONCHOSCOPY Therapeutic Aspiration of Tracheobronchial Tree  PROCEDURE DATE: 03/17/2022  TIME:  NAME:  Keith Carter  DOB:July 02, 1955  MRN: [id number] LOC:  IC05A/IC05A-AA    HOSP DAY: @LENGTHOFSTAYDAYS$ @ CODE STATUS:      Code Status Orders  (From admission, onward)           Start     Ordered   03/16/22 1746  Full code  Continuous       Question:  By:  Answer:  Consent: discussion documented in EHR   03/16/22 1746           Code Status History     Date Active Date Inactive Code Status Order ID Comments User Context   10/07/2020 1955 10/12/2020 1832 DNR [id number]  Christel Mormon, MD ED   09/28/2019 1207 10/04/2019 0520 DNR [id number]  Karmen Bongo, MD ED   04/04/2019 2350 04/05/2019 2108 Full Code [id number]  Acheampong, Warnell Bureau, MD Inpatient           Indications/Preliminary Diagnosis:   Consent: (Place X beside choice/s below)  The benefits, risks and possible complications of the procedure were        explained to:  ___ patient  __x_ patient's family  ___ other:___________  who verbalized understanding and gave:  ___ verbal  ___ written  ___ verbal and written  ___ telephone  ___ other:________ consent.      Unable to obtain consent; procedure performed on emergent basis.     Other:       PRESEDATION ASSESSMENT: History and Physical has been performed. Patient meds and allergies have been reviewed. Presedation airway examination has been performed and documented. Baseline vital signs, sedation score, oxygenation status, and cardiac rhythm were reviewed. Patient was deemed to be in satisfactory condition to undergo the procedure.    PREMEDICATIONS:   Sedative/Narcotic Amt Dose   Versed  mg   Fentanyl 50 mcg  Diprivan gtt mg            PROCEDURE DETAILS: Timeout performed and correct patient, name, & ID confirmed. Following prep per Pulmonary policy, appropriate sedation was administered. The Bronchoscope was inserted in to oral cavity with bite  block in place. Therapeutic aspiration of Tracheobronchial tree was performed.  Airway exam proceeded with findings, technical procedures, and specimen collection as noted below. At the end of exam the scope was withdrawn without incident. Impression and Plan as noted below.           Airway Prep (Place X beside choice below)   1% Transtracheal Lidocaine Anesthetization 7 cc   Patient prepped per Bronchoscopy Lab Policy       Insertion Route (Place X beside choice below)   Nasal   Oral  x Endotracheal Tube   Tracheostomy   INTRAPROCEDURE MEDICATIONS:  Sedative/Narcotic Amt Dose   Versed  mg   Fentanyl gtt mcg  Diprivan gtt mg       Medication Amt Dose  Medication Amt Dose  Lidocaine 1%  cc  Epinephrine 1:10,000 sol  cc  Xylocaine 4%  cc  Cocaine  cc   TECHNICAL PROCEDURES: (Place X beside choice below)   Procedures  Description    None     Electrocautery     Cryotherapy     Balloon Dilatation     Bronchography     Stent Placement   x  Therapeutic Aspiration RLL , RML, RUL    Laser/Argon Plasma    Brachytherapy Catheter Placement   x Foreign Body  Removal  Beef chunk in left lung base       SPECIMENS (Sites): (Place X beside choice below)  Specimens Description   No Specimens Obtained     Washings   x Lavage Right middle lobe   Biopsies    Fine Needle Aspirates    Brushings    Sputum    FINDINGS:  beef chunks in left lung removed with aspiration of bronchi, mucus plugging of RLL and RML with therapeutic aspiration of tracheobronahchial tree.  BAL at RML due to mucopurulent phlegm  ESTIMATED BLOOD LOSS: none COMPLICATIONS/RESOLUTION: none      IMPRESSION:POST-PROCEDURE DX:   beef chunks in left lung removed with aspiration of bronchi, mucus plugging of RLL and RML with therapeutic aspiration of tracheobronahchial tree.  BAL at RML due to mucopurulent phlegm    RECOMMENDATION/PLAN:   Await microbiology.  May start process for liberation from  ventilatory   Ottie Glazier, M.D.  Pulmonary & [place]

## 2022-03-17 NOTE — TOC Initial Note (Signed)
Transition of Care [facility]) - Initial/Assessment Note    Patient Details  Name: Keith Carter MRN: [id number] Date of Birth: 01/14/1956  Transition of Care [facility]) CM/SW Contact:    Shelbie Hutching, RN Phone Number: 03/17/2022, 10:56 AM  Clinical Narrative:                 Patient admitted to the hospital after aspirating on a piece of meat at his long term care facility, [facility].  Patient was intubated in the emergency room and is now in the ICU.  MD plans to bronch today and then possibly extubate.  TOC will cont to follow and assist with discharge planning.   Expected Discharge Plan: Skilled Nursing Facility Barriers to Discharge: Continued Medical Work up   Patient Goals and CMS Choice Patient states their goals for this hospitalization and ongoing recovery are:: Patient intubated unable to state goals          Expected Discharge Plan and Services   Discharge Planning Services: CM Consult   Living arrangements for the past 2 months: [place]                 DME Arranged: N/A DME Agency: NA       HH Arranged: NA          Prior Living Arrangements/Services Living arrangements for the past 2 months: [place] Lives with:: Facility Resident Patient language and need for interpreter reviewed:: Yes        Need for Family Participation in Patient Care: Yes (Comment) Care giver support system in place?: Yes (comment)   Criminal Activity/Legal Involvement Pertinent to Current Situation/Hospitalization: No - Comment as needed  Activities of Daily Living      Permission Sought/Granted Permission sought to share information with : Case Manager, Customer service manager, Family Supports    Share Information with NAME: Keith Carter  Permission granted to share info w AGENCY: [place] granted to share info w Relationship: daughter  Permission granted to share info w Contact Information:  [phone]  Emotional Assessment Appearance:: Appears stated age Attitude/Demeanor/Rapport: Intubated (Following Commands or Not Following Commands)     Alcohol / Substance Use: Not Applicable Psych Involvement: No (comment)  Admission diagnosis:  Respiratory arrest ([place]) [R09.2] Foreign body aspiration [id number] Choking, initial encounter [T17.308A] Patient Active Problem List   Diagnosis Date Noted   Foreign body aspiration 03/16/2022   Failure to thrive in adult 10/11/2020   Hypertensive emergency 10/08/2020   Acute massive pulmonary embolism ([place]) 10/07/2020   Spondylosis without myelopathy or radiculopathy, lumbosacral region 08/18/2020   DDD (degenerative disc disease), lumbosacral 07/28/2020   Lumbar Grade 1 Anterolisthesis of L1/L2 (2 mm anterior translation w/ flexion) 04/22/2020   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) 04/22/2020   Abnormal MRI, lumbar spine (01/10/2017) 03/16/2020   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   Lumbosacral facet hypertrophy (Multilevel) (Bilateral) 03/16/2020   Lumbar facet joint syndrome (Bilateral) 03/16/2020   Osteoarthritis of facet joint of lumbar spine 03/16/2020   Lumbar lateral recess stenosis (Multilevel) (Bilateral) 03/16/2020   Lumbosacral foraminal stenosis (Multilevel) (Bilateral) 03/16/2020   Lumbar central spinal stenosis, 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) 03/16/2020   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   Dextroscoliosis of lumbar spine 03/16/2020   Lumbar Grade 1 Retrolisthesis of L2/L3 and L3/L4 03/16/2020   Abnormal CT scan, lumbar spine (01/27/2020) 03/16/2020   Elevated hemoglobin A1c 03/16/2020   Chronic anticoagulation (PLAVIX) 03/16/2020  Anxiety 03/15/2020   Arthritis 03/15/2020   Erectile dysfunction 03/15/2020   Hypertension 03/15/2020   Chronic pain syndrome 03/15/2020   Pharmacologic therapy 03/15/2020   Disorder of skeletal system 03/15/2020   Problems influencing  health status 03/15/2020   Expressive aphasia 10/08/2019   TIA (transient ischemic attack) 09/29/2019   Aphasia 09/28/2019   History of stroke 05/09/2019   Acute CVA (cerebrovascular accident) ([place]) 04/05/2019   Dysarthria    Type 2 diabetes mellitus with hyperlipidemia (HCC)    Essential hypertension    Obesity (BMI 30-39.9)    Benign prostatic hyperplasia without lower urinary tract symptoms    Stroke ([place]) 04/04/2019   Controlled type 2 diabetes mellitus without complication, without long-term current use of insulin ([place]) 09/06/2017   Pain of both elbows 04/14/2015   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[place]) 09/16/2014   Chronic low back pain (1ry area of Pain) (Bilateral) w/o sciatica 11/27/2013   PCP:  [name], NP Pharmacy:   [facility], [place] [address] [place] [address] Phone: [phone] Fax: [phone]     Social Determinants of Health ([place]) Social History: SDOH Screenings   Depression (PHQ2-9): Low Risk  (05/26/2021)  Tobacco Use: Low Risk  (03/04/2022)   SDOH Interventions:     Readmission Risk Interventions     No data to display

## 2022-03-17 NOTE — Procedures (Signed)
Routine EEG Report  Keith Carter is a 67 y.o. male with a history of altered mental status who is undergoing an EEG to evaluate for seizures.  Report: This EEG was acquired with electrodes placed according to the International 10-20 electrode system (including Fp1, Fp2, F3, F4, C3, C4, P3, P4, O1, O2, T3, T4, T5, T6, A1, A2, Fz, Cz, Pz). The following electrodes were missing or displaced: none.  The best background was continuous at 5-7 Hz. This activity is reactive to stimulation. There was no sleep architecture. There was no focal slowing. There were no interictal epileptiform discharges. There were no electrographic seizures identified. Photic stimulation and hyperventilation were not performed.  Impression and clinical correlation: This EEG was obtained sedated on versed and fentanyl and is abnormal due to mild-to-moderate diffuse slowing indicative of global cerebral dysfunction, medication effect, or both. Epileptiform abnormalities were not seen during this recording.  Su Monks, MD [facility] [phone]  If 7pm- 7am, please page neurology on call as listed in [place].

## 2022-03-17 NOTE — Procedures (Signed)
Extubation Procedure Note  Patient Details:   Name: Keith Carter DOB: 1955/06/23 MRN: [id number]   Airway Documentation:    Vent end date: 03/17/22 Vent end time: 1620   Evaluation  O2 sats: stable throughout Complications: No apparent complications Patient did tolerate procedure well. Bilateral Breath Sounds: Clear, Diminished     Marlane Mingle 03/17/2022, 4:26 PM

## 2022-03-17 NOTE — Progress Notes (Signed)
PHARMACY CONSULT NOTE - FOLLOW UP  Pharmacy Consult for Electrolyte Monitoring and Replacement   Recent Labs: Potassium (mmol/L)  Date Value  03/17/2022 4.2   Magnesium (mg/dL)  Date Value  03/17/2022 1.8   Calcium (mg/dL)  Date Value  03/17/2022 8.5 (L)   Albumin (g/dL)  Date Value  03/17/2022 3.0 (L)  03/16/2020 3.8   Phosphorus (mg/dL)  Date Value  03/17/2022 4.0   Sodium (mmol/L)  Date Value  03/17/2022 136  03/16/2020 138    Assessment: 67 year old male admitted with altered mental status, aspiration of foreign body, and AKI. PMH includes dementia, Parkinson's, CVA, uncontrolled HTN, T2DM.   Day 2 of antibiotics on Unasyn. Bronchoscopy to remove foreign body on 2/15.  Diet: NPO DVT PPX: SQH Q12Hr  Goal of Therapy:  Electrolytes within normal limits  Plan:  No replacement warranted today.  Follow up electrolytes tomorrow AM.   Wynelle Cleveland ,PharmD, BCPS Clinical Pharmacist 03/17/2022 7:28 AM

## 2022-03-17 NOTE — Progress Notes (Signed)
CRITICAL CARE     Name: Keith Carter MRN: [id number] DOB: [birth year]     LOS: 1   SUBJECTIVE FINDINGS & SIGNIFICANT EVENTS    History of presenting illness:  This is a cognitively impaired 67 year old male with dementia and severe Parkinson's disease, he also has a history of CVA uncontrolled hypertension with episodes of hypertensive emergency, history of massive PE a year ago, poorly controlled diabetes with diabetic retinopathy, epidural lipomatosis, multilevel lumbar osteoarthritis with stenosis and scoliosis, obstructive uropathy with BPH, chronic pain syndrome, morbid obesity with a BMI over 40 chronic aphasia and anxiety disorder and erectile dysfunction with CODE STATUS DNR who came in via emergency room due to unresponsive mental status.  In the ED patient was evaluated by neurologist who did not recommend continuous EEG transfer.  Per ER doc patient had steak lodged in the airway which was removed by EMS personnel in the field.  Patient had CT head in the ER which shows mild diffuse atrophy and moderate periventricular deep white matter hypodensity due to small chronic vessel ischemia.  Per documentation ER provider had conversation with daughter who wanted patient intubated and he has come to the medical intensive care unit on mechanical ventilator for further medical management post foreign body aspiration with respiratory failure and unresponsiveness.  Patient had CT angio PE protocol which was independently reviewed by me ,with no venous pulmonary thromboembolism noted however right lower lobe pneumonia suggestive of aspiration is noted.  03/17/22- patient has rhonchorous breathing worse on right.  Plan for bronchoscopy and weaning trial for liberation post aspiration of foreign body  Lines/tubes : Airway 7.5  mm (Active)  Secured at (cm) 25 cm 03/16/22 1435  Measured From Lips 03/16/22 1435  Secured Location Left 03/16/22 1435  Secured By [employer] 03/16/22 1435  Cuff Pressure (cm H2O) MOV (Manual Technique) 03/16/22 1435  Site Condition Dry 03/16/22 1435     Urethral Catheter Elana, RN Temperature probe 16 Fr. (Active)  Indication for Insertion or Continuance of Catheter Unstable critically ill patients first 24-48 hours (See Criteria) 03/16/22 1453  Site Assessment Clean, Dry, Intact 03/16/22 1453  Catheter Maintenance Bag below level of bladder;Catheter secured;Drainage bag/tubing not touching floor;Insertion date on drainage bag;No dependent loops;Seal intact 03/16/22 1453  Collection Container Standard drainage bag 03/16/22 1453  Securement Method Securing device (Describe) 03/16/22 1453  Urinary Catheter Interventions (if applicable) Unclamped [id number] 1453  Output (mL) 100 mL 03/16/22 1453    Microbiology/Sepsis markers: Results for orders placed or performed during the hospital encounter of 03/16/22  Culture, blood (Routine X 2) w Reflex to ID Panel     Status: None (Preliminary result)   Collection Time: 03/16/22  1:44 PM   Specimen: BLOOD  Result Value Ref Range Status   Specimen Description BLOOD RIGHT WRIST  Final   Special Requests   Final    BOTTLES DRAWN AEROBIC AND ANAEROBIC Blood Culture adequate volume   Culture   Final    NO GROWTH < 12 HOURS Performed at [facility], [address] [place], [place] [address]    Report Status PENDING  Incomplete  Culture, blood (Routine X 2) w Reflex to ID Panel     Status: None (Preliminary result)   Collection Time: 03/16/22  1:44 PM   Specimen: BLOOD  Result Value Ref Range Status   Specimen Description BLOOD RIGHT WRIST  Final   Special Requests   Final    BOTTLES DRAWN AEROBIC AND ANAEROBIC Blood Culture  adequate volume   Culture   Final    NO GROWTH < 12 HOURS Performed at [facility],  [address] [place], [place] [address]    Report Status PENDING  Incomplete  MRSA Next Gen by PCR, Nasal     Status: None   Collection Time: 03/16/22  6:43 PM   Specimen: Nasal Mucosa; Nasal Swab  Result Value Ref Range Status   MRSA by PCR Next Gen NOT DETECTED NOT DETECTED Final    Comment: (NOTE) The GeneXpert MRSA Assay (FDA approved for NASAL specimens only), is one component of a comprehensive MRSA colonization surveillance program. It is not intended to diagnose MRSA infection nor to guide or monitor treatment for MRSA infections. Test performance is not FDA approved in patients less than 73 years old. Performed at [facility], [address], [place], [place] [address]     Anti-infectives:  Anti-infectives (From admission, onward)    Start     Dose/Rate Route Frequency Ordered Stop   03/16/22 2100  Ampicillin-Sulbactam (UNASYN) 3 g in sodium chloride 0.9 % 100 mL IVPB        3 g 200 mL/hr over 30 Minutes Intravenous Every 6 hours 03/16/22 1953         PAST MEDICAL HISTORY   Past Medical History:  Diagnosis Date   Anxiety    Dyslipidemia    Enlarged prostate    Hypertension    Prediabetes    TIA (transient ischemic attack)      SURGICAL HISTORY   Past Surgical History:  Procedure Laterality Date   BACK SURGERY     HERNIA REPAIR     PULMONARY THROMBECTOMY N/A 10/08/2020   Procedure: PULMONARY THROMBECTOMY;  Surgeon: Algernon Huxley, MD;  Location: [place] [facility];  Service: Cardiovascular;  Laterality: N/A;     FAMILY HISTORY   Family History  Problem Relation Age of Onset   Stroke Neg Hx      SOCIAL HISTORY   Social History   Tobacco Use   Smoking status: Never   Smokeless tobacco: Never  Vaping Use   Vaping Use: Never used  Substance Use Topics   Alcohol use: Yes   Drug use: Never     MEDICATIONS   Current Medication:  Current Facility-Administered Medications:    0.9 %  sodium chloride infusion, 250 mL,  Intravenous, Continuous, Noralee Space, RPH, Held at 03/16/22 1940   Ampicillin-Sulbactam (UNASYN) 3 g in sodium chloride 0.9 % 100 mL IVPB, 3 g, Intravenous, Q6H, Noralee Space, RPH, Stopped at 03/17/22 0354   Chlorhexidine Gluconate Cloth 2 % PADS 6 each, 6 each, Topical, Q0600, Ottie Glazier, MD, 6 each at 03/17/22 0528   clopidogrel (PLAVIX) tablet 75 mg, 75 mg, Per Tube, Daily, Rust-Chester, Toribio Harbour L, NP   docusate (COLACE) 50 MG/5ML liquid 100 mg, 100 mg, Per Tube, BID PRN, Ottie Glazier, MD   docusate (COLACE) 50 MG/5ML liquid 100 mg, 100 mg, Per Tube, BID, Rust-Chester, Britton L, NP, 100 mg at 03/16/22 2157   famotidine (PEPCID) tablet 20 mg, 20 mg, Per Tube, BID, Rust-Chester, Britton L, NP, 20 mg at 03/16/22 2157   fentaNYL 2532mg in NS 2566m(1029mml) infusion-PREMIX, 0-200 mcg/hr, Intravenous, Continuous, Rust-Chester, Britton L, NP, Last Rate: 15 mL/hr at 03/17/22 0700, 150 mcg/hr at 03/17/22 0700   heparin injection 5,000 Units, 5,000 Units, Subcutaneous, Q12H, AleOttie GlazierD, 5,000 Units at 03/16/22 2157   insulin aspart (novoLOG) injection 0-20 Units, 0-20 Units,  Subcutaneous, Q4H, Rust-Chester, Britton L, NP, 3 Units at 03/17/22 0319   midazolam (VERSED) injection 1-2 mg, 1-2 mg, Intravenous, Q1H PRN, Rust-Chester, Britton L, NP, 2 mg at 03/17/22 0143   norepinephrine (LEVOPHED) 59m in 2553m(0.016 mg/mL) premix infusion, 2-10 mcg/min, Intravenous, Titrated, MeNoralee SpaceRPH, Last Rate: 30 mL/hr at 03/17/22 0700, 8 mcg/min at 03/17/22 0700   Oral care mouth rinse, 15 mL, Mouth Rinse, Q2H, Rust-Chester, Britton L, NP, 15 mL at 03/17/22 [id number] Oral care mouth rinse, 15 mL, Mouth Rinse, PRN, Rust-Chester, BrToribio Harbour, NP   polyethylene glycol (MIRALAX / GLYCOLAX) packet 17 g, 17 g, Per Tube, Daily PRN, AlLanney GinsFuad, MD   polyethylene glycol (MIRALAX / GLYCOLAX) packet 17 g, 17 g, Per Tube, Daily, Rust-Chester, Britton L, NP, 17 g at 03/16/22 2003    propofol (DIPRIVAN) 1000 MG/100ML infusion, 5-50 mcg/kg/min, Intravenous, Continuous, Rust-Chester, Britton L, NP, Last Rate: 6.72 mL/hr at 03/17/22 0700, 10 mcg/kg/min at 03/17/22 0700    ALLERGIES   Patient has no known allergies.    REVIEW OF SYSTEMS    Unable to obtain due to critical illness with hypoventilation  PHYSICAL EXAMINATION   Vital Signs: Temp:  [95.5 F (35.3 C)-98.6 F (37 C)] 98.6 F (37 C) (02/15 0715) Pulse Rate:  [48-144] 49 (02/15 0715) Resp:  [13-50] 16 (02/15 0715) BP: (63-194)/(53-125) 106/64 (02/15 0715) SpO2:  [87 %-100 %] 100 % (02/15 0724) FiO2 (%):  [35 %-60 %] 35 % (02/15 0724) Weight:  [1CB:4811055g-136.1 kg] 112.4 kg (02/15 0333)  GENERAL: Age-appropriate no apparent distress on mechanical ventilation HEAD: Normocephalic, atraumatic.  EYES: Pupils equal, round, reactive to light.  No scleral icterus.  MOUTH: Moist mucosal membrane.  Positive endotracheal tube NECK: Supple. No thyromegaly. No nodules. No JVD.  PULMONARY: Adventitious lung sounds noted with rhonchorous breath sounds worse at the right lower lung zone. CARDIOVASCULAR: S1 and S2. Regular rate and rhythm. No murmurs, rubs, or gallops.  GASTROINTESTINAL: Soft, nontender, non-distended. No masses. Positive bowel sounds. No hepatosplenomegaly.  MUSCULOSKELETAL: No swelling, clubbing, or edema.  NEUROLOGIC: GCS4T SKIN:intact,warm,dry   PERTINENT DATA     Infusions:  sodium chloride Stopped (03/16/22 1940)   ampicillin-sulbactam (UNASYN) IV Stopped (03/17/22 0354)   fentaNYL infusion INTRAVENOUS 150 mcg/hr (03/17/22 0700)   norepinephrine (LEVOPHED) Adult infusion 8 mcg/min (03/17/22 0700)   propofol (DIPRIVAN) infusion 10 mcg/kg/min (03/17/22 0700)   Scheduled Medications:  Chlorhexidine Gluconate Cloth  6 each Topical Q0600   clopidogrel  75 mg Per Tube Daily   docusate  100 mg Per Tube BID   famotidine  20 mg Per Tube BID   heparin  5,000 Units Subcutaneous Q12H   insulin  aspart  0-20 Units Subcutaneous Q4H   mouth rinse  15 mL Mouth Rinse Q2H   polyethylene glycol  17 g Per Tube Daily   PRN Medications: docusate, midazolam, mouth rinse, polyethylene glycol Hemodynamic parameters:   Intake/Output: 02/14 0701 - 02/15 0700 In: 2412.7 [I.V.:716.6; NG/GT:200; IV Piggyback:1496.1] Out: 600 [Urine:600]  Ventilator  Settings: Vent Mode: PRVC FiO2 (%):  [35 %-60 %] 35 % Set Rate:  [16 bmp] 16 bmp Vt Set:  [500 mL] 500 mL PEEP:  [5 cmH20] 5 cmH20 Plateau Pressure:  [14 cmH20-15 cmH20] 15 cmH20     LAB RESULTS:  Basic Metabolic Panel: Recent Labs  Lab 03/16/22 1344 03/17/22 0402  NA 137 136  K 3.9 4.2  CL 104 103  CO2 15* 25  GLUCOSE 212* 144*  BUN 24*  QUANTITY NOT SUFFICIENT, UNABLE TO PERFORM TEST 27*  CREATININE 1.41*  QUANTITY NOT SUFFICIENT, UNABLE TO PERFORM TEST 1.33*  CALCIUM 9.1 8.5*  MG 1.8 1.8  PHOS 4.3 4.0    Liver Function Tests: Recent Labs  Lab 03/16/22 1344 03/17/22 0402  AST 60* 36  ALT 61* 43  ALKPHOS 96 74  BILITOT 0.9 0.9  PROT 7.2 5.8*  ALBUMIN 3.7 3.0*    Recent Labs  Lab 03/16/22 1344  LIPASE 35    No results for input(s): "AMMONIA" in the last 168 hours. CBC: Recent Labs  Lab 03/16/22 1344 03/17/22 0402  WBC 12.0* 15.1*  NEUTROABS  --  12.1*  HGB 14.6 12.3*  HCT 44.6 38.4*  MCV 89.0 89.9  PLT 247 239    Cardiac Enzymes: No results for input(s): "CKTOTAL", "CKMB", "CKMBINDEX", "TROPONINI" in the last 168 hours. BNP: Invalid input(s): "POCBNP" CBG: Recent Labs  Lab 03/16/22 1826 03/16/22 2352 03/17/22 0313 03/17/22 0736  GLUCAP 235* 157* 134* 121*       IMAGING RESULTS:  Imaging: DG Abd 1 View  Result Date: 03/16/2022 CLINICAL DATA:  OG tube placement EXAM: ABDOMEN - 1 VIEW COMPARISON:  CT 03/16/2022 FINDINGS: Enteric tube is present with tip coiled in the left upper quadrant consistent with location in the upper stomach. Visualized bowel gas pattern is normal with  scattered gas present. Postoperative changes in the lumbar spine and right lower quadrant. Residual contrast material in the renal collecting systems. Infiltration or atelectasis suggested in the right lung base. IMPRESSION: Enteric tube tip is present, coiled in the left upper quadrant consistent with location in the upper stomach. Electronically Signed   By: Lucienne Capers M.D.   On: 03/16/2022 19:22   CT Angio Chest PE W and/or Wo Contrast  Result Date: 03/16/2022 CLINICAL DATA:  Concern for pulmonary embolism. Respiratory distress. Food stuck in throat: Respiratory distress. Unresponsive EXAM: CT ANGIOGRAPHY CHEST CT ABDOMEN AND PELVIS WITH CONTRAST TECHNIQUE: Multidetector CT imaging of the chest was performed using the standard protocol during bolus administration of intravenous contrast. Multiplanar CT image reconstructions and MIPs were obtained to evaluate the vascular anatomy. Multidetector CT imaging of the abdomen and pelvis was performed using the standard protocol during bolus administration of intravenous contrast. RADIATION DOSE REDUCTION: This exam was performed according to the departmental dose-optimization program which includes automated exposure control, adjustment of the mA and/or kV according to patient size and/or use of iterative reconstruction technique. CONTRAST:  149m OMNIPAQUE IOHEXOL 350 MG/ML SOLN COMPARISON:  None Available. FINDINGS: CTA CHEST FINDINGS Cardiovascular: No filling defects within the pulmonary arteries to suggest acute pulmonary embolism. Mediastinum/Nodes: Endotracheal tube within the mid trachea. No endobronchial foreign body. No axillary or supraclavicular adenopathy. No mediastinal or hilar adenopathy. No pericardial fluid. Esophagus normal. Relatively hypodense lesion in the LEFT lobe of the thyroid gland measures 18 mm. Lungs/Pleura: Mild basilar atelectasis. No pulmonary infarction. No pleural fluid or pneumonia. Musculoskeletal: A single nondisplaced  anterior RIGHT sixth rib fracture (image 101/4) Review of the MIP images confirms the above findings. CT ABDOMEN and PELVIS FINDINGS Lower chest: Lung bases are clear. Hepatobiliary: No focal hepatic lesion. Normal gallbladder. No biliary duct dilatation. Common bile duct is normal. Pancreas: Pancreas is normal. No ductal dilatation. No pancreatic inflammation. Spleen: Normal spleen Adrenals/urinary tract: Adrenal glands normal. Nonobstructing LEFT renal calculi. No obstructive uropathy. Foley catheter within collapsed bladder. Stomach/Bowel: Stomach, small bowel, appendix, and cecum are normal. The colon and rectosigmoid colon are normal. LEFT  Vascular/Lymphatic: Abdominal aorta is normal caliber. No periportal or retroperitoneal adenopathy. No pelvic adenopathy. Reproductive: Prostate unremarkable Other: There is a well-circumscribed fluid collection in the RIGHT ventral peritoneal space measuring 17.5 cm x 10.2 cm. This collection is not changed from CT exam 10/11/2020. There is a wavy hernia mesh centrally within the collection. There is a midline hernia medial to the contain fluid collection contains loop of nonobstructed small bowel (image 67/4) Musculoskeletal: Posterior lumbar fusion and sacral fusion. No acute findings. Review of the MIP images confirms the above findings. IMPRESSION: CHEST IMPRESSION: 1. No evidence acute pulmonary embolism. 2. No endobronchial foreign body. 3. Mild basilar atelectasis. 4. Single nondisplaced anterior RIGHT sixth rib fracture. 5. Relatively hypodense lesion in the LEFT lobe of the thyroid gland. Recommend non-emergent thyroid ultrasound. Reference: J Am Coll Radiol. 2015 Feb;12(2): 143-50 PELVIS IMPRESSION: 1. No acute findings in the abdomen pelvis. 2. Stable fluid collection in the RIGHT ventral peritoneal space with hernia mesh. 3. Midline ventral hernia contains a loop of nonobstructed small bowel. 4. Nonobstructing LEFT renal calculi. 5. Foley catheter in collapsed  bladder. 6. Posterior lumbar fusion and sacral fusion. Electronically Signed   By: Suzy Bouchard M.D.   On: 03/16/2022 16:17   CT ABDOMEN PELVIS W CONTRAST  Result Date: 03/16/2022 CLINICAL DATA:  Concern for pulmonary embolism. Respiratory distress. Food stuck in throat: Respiratory distress. Unresponsive EXAM: CT ANGIOGRAPHY CHEST CT ABDOMEN AND PELVIS WITH CONTRAST TECHNIQUE: Multidetector CT imaging of the chest was performed using the standard protocol during bolus administration of intravenous contrast. Multiplanar CT image reconstructions and MIPs were obtained to evaluate the vascular anatomy. Multidetector CT imaging of the abdomen and pelvis was performed using the standard protocol during bolus administration of intravenous contrast. RADIATION DOSE REDUCTION: This exam was performed according to the departmental dose-optimization program which includes automated exposure control, adjustment of the mA and/or kV according to patient size and/or use of iterative reconstruction technique. CONTRAST:  146m OMNIPAQUE IOHEXOL 350 MG/ML SOLN COMPARISON:  None Available. FINDINGS: CTA CHEST FINDINGS Cardiovascular: No filling defects within the pulmonary arteries to suggest acute pulmonary embolism. Mediastinum/Nodes: Endotracheal tube within the mid trachea. No endobronchial foreign body. No axillary or supraclavicular adenopathy. No mediastinal or hilar adenopathy. No pericardial fluid. Esophagus normal. Relatively hypodense lesion in the LEFT lobe of the thyroid gland measures 18 mm. Lungs/Pleura: Mild basilar atelectasis. No pulmonary infarction. No pleural fluid or pneumonia. Musculoskeletal: A single nondisplaced anterior RIGHT sixth rib fracture (image 101/4) Review of the MIP images confirms the above findings. CT ABDOMEN and PELVIS FINDINGS Lower chest: Lung bases are clear. Hepatobiliary: No focal hepatic lesion. Normal gallbladder. No biliary duct dilatation. Common bile duct is normal. Pancreas:  Pancreas is normal. No ductal dilatation. No pancreatic inflammation. Spleen: Normal spleen Adrenals/urinary tract: Adrenal glands normal. Nonobstructing LEFT renal calculi. No obstructive uropathy. Foley catheter within collapsed bladder. Stomach/Bowel: Stomach, small bowel, appendix, and cecum are normal. The colon and rectosigmoid colon are normal. LEFT Vascular/Lymphatic: Abdominal aorta is normal caliber. No periportal or retroperitoneal adenopathy. No pelvic adenopathy. Reproductive: Prostate unremarkable Other: There is a well-circumscribed fluid collection in the RIGHT ventral peritoneal space measuring 17.5 cm x 10.2 cm. This collection is not changed from CT exam 10/11/2020. There is a wavy hernia mesh centrally within the collection. There is a midline hernia medial to the contain fluid collection contains loop of nonobstructed small bowel (image 67/4) Musculoskeletal: Posterior lumbar fusion and sacral fusion. No acute findings. Review of the MIP images confirms the  above findings. IMPRESSION: CHEST IMPRESSION: 1. No evidence acute pulmonary embolism. 2. No endobronchial foreign body. 3. Mild basilar atelectasis. 4. Single nondisplaced anterior RIGHT sixth rib fracture. 5. Relatively hypodense lesion in the LEFT lobe of the thyroid gland. Recommend non-emergent thyroid ultrasound. Reference: J Am Coll Radiol. 2015 Feb;12(2): 143-50 PELVIS IMPRESSION: 1. No acute findings in the abdomen pelvis. 2. Stable fluid collection in the RIGHT ventral peritoneal space with hernia mesh. 3. Midline ventral hernia contains a loop of nonobstructed small bowel. 4. Nonobstructing LEFT renal calculi. 5. Foley catheter in collapsed bladder. 6. Posterior lumbar fusion and sacral fusion. Electronically Signed   By: Suzy Bouchard M.D.   On: 03/16/2022 16:17   CT Head Wo Contrast  Result Date: 03/16/2022 CLINICAL DATA:  Altered mental status EXAM: CT HEAD WITHOUT CONTRAST TECHNIQUE: Contiguous axial images were obtained  from the base of the skull through the vertex without intravenous contrast. RADIATION DOSE REDUCTION: This exam was performed according to the departmental dose-optimization program which includes automated exposure control, adjustment of the mA and/or kV according to patient size and/or use of iterative reconstruction technique. COMPARISON:  Head CT 08/19/2021.  MRI brain 08/19/2021. FINDINGS: Brain: No evidence of acute infarction, hemorrhage, hydrocephalus, extra-axial collection or mass lesion/mass effect. Again seen is mild diffuse atrophy. There is moderate periventricular deep white matter hypodensity similar to the prior study. Vascular: No hyperdense vessel or unexpected calcification. Skull: Normal. Negative for fracture or focal lesion. Sinuses/Orbits: No acute finding. Other: None. IMPRESSION: 1. No acute intracranial abnormality. 2. Stable mild diffuse atrophy and moderate periventricular deep white matter hypodensity, likely due to chronic small vessel ischemia. Electronically Signed   By: Ronney Asters M.D.   On: 03/16/2022 16:04   DG Chest Portable 1 View  Result Date: 03/16/2022 CLINICAL DATA:  Intubation EXAM: PORTABLE CHEST 1 VIEW COMPARISON:  Earlier 03/16/2022 FINDINGS: Interval placement of ET tube with tip 4.5 cm above the carina. Underinflation. No consolidation, pneumothorax or effusion. Enlarged cardiopericardial silhouette without edema. Bronchovascular crowding. Tortuous aorta. Overlapping cardiac leads. IMPRESSION: 1. Interval placement of ET tube with tip 4.5 cm above the carina. 2. Underinflation with bronchovascular crowding. Electronically Signed   By: Jill Side M.D.   On: 03/16/2022 15:04   DG Chest Portable 1 View  Result Date: 03/16/2022 CLINICAL DATA:  Choked on steak. EXAM: PORTABLE CHEST 1 VIEW COMPARISON:  Chest x-ray dated October 12, 2020. FINDINGS: The heart size and mediastinal contours are within normal limits. Both lungs are clear. The visualized skeletal  structures are unremarkable. IMPRESSION: No active disease. Electronically Signed   By: Titus Dubin M.D.   On: 03/16/2022 14:20   @PROBHOSP$ @ DG Abd 1 View  Result Date: 03/16/2022 CLINICAL DATA:  OG tube placement EXAM: ABDOMEN - 1 VIEW COMPARISON:  CT 03/16/2022 FINDINGS: Enteric tube is present with tip coiled in the left upper quadrant consistent with location in the upper stomach. Visualized bowel gas pattern is normal with scattered gas present. Postoperative changes in the lumbar spine and right lower quadrant. Residual contrast material in the renal collecting systems. Infiltration or atelectasis suggested in the right lung base. IMPRESSION: Enteric tube tip is present, coiled in the left upper quadrant consistent with location in the upper stomach. Electronically Signed   By: Lucienne Capers M.D.   On: 03/16/2022 19:22   CT Angio Chest PE W and/or Wo Contrast  Result Date: 03/16/2022 CLINICAL DATA:  Concern for pulmonary embolism. Respiratory distress. Food stuck in throat: Respiratory distress. Unresponsive EXAM:  CT ANGIOGRAPHY CHEST CT ABDOMEN AND PELVIS WITH CONTRAST TECHNIQUE: Multidetector CT imaging of the chest was performed using the standard protocol during bolus administration of intravenous contrast. Multiplanar CT image reconstructions and MIPs were obtained to evaluate the vascular anatomy. Multidetector CT imaging of the abdomen and pelvis was performed using the standard protocol during bolus administration of intravenous contrast. RADIATION DOSE REDUCTION: This exam was performed according to the departmental dose-optimization program which includes automated exposure control, adjustment of the mA and/or kV according to patient size and/or use of iterative reconstruction technique. CONTRAST:  149m OMNIPAQUE IOHEXOL 350 MG/ML SOLN COMPARISON:  None Available. FINDINGS: CTA CHEST FINDINGS Cardiovascular: No filling defects within the pulmonary arteries to suggest acute pulmonary  embolism. Mediastinum/Nodes: Endotracheal tube within the mid trachea. No endobronchial foreign body. No axillary or supraclavicular adenopathy. No mediastinal or hilar adenopathy. No pericardial fluid. Esophagus normal. Relatively hypodense lesion in the LEFT lobe of the thyroid gland measures 18 mm. Lungs/Pleura: Mild basilar atelectasis. No pulmonary infarction. No pleural fluid or pneumonia. Musculoskeletal: A single nondisplaced anterior RIGHT sixth rib fracture (image 101/4) Review of the MIP images confirms the above findings. CT ABDOMEN and PELVIS FINDINGS Lower chest: Lung bases are clear. Hepatobiliary: No focal hepatic lesion. Normal gallbladder. No biliary duct dilatation. Common bile duct is normal. Pancreas: Pancreas is normal. No ductal dilatation. No pancreatic inflammation. Spleen: Normal spleen Adrenals/urinary tract: Adrenal glands normal. Nonobstructing LEFT renal calculi. No obstructive uropathy. Foley catheter within collapsed bladder. Stomach/Bowel: Stomach, small bowel, appendix, and cecum are normal. The colon and rectosigmoid colon are normal. LEFT Vascular/Lymphatic: Abdominal aorta is normal caliber. No periportal or retroperitoneal adenopathy. No pelvic adenopathy. Reproductive: Prostate unremarkable Other: There is a well-circumscribed fluid collection in the RIGHT ventral peritoneal space measuring 17.5 cm x 10.2 cm. This collection is not changed from CT exam 10/11/2020. There is a wavy hernia mesh centrally within the collection. There is a midline hernia medial to the contain fluid collection contains loop of nonobstructed small bowel (image 67/4) Musculoskeletal: Posterior lumbar fusion and sacral fusion. No acute findings. Review of the MIP images confirms the above findings. IMPRESSION: CHEST IMPRESSION: 1. No evidence acute pulmonary embolism. 2. No endobronchial foreign body. 3. Mild basilar atelectasis. 4. Single nondisplaced anterior RIGHT sixth rib fracture. 5. Relatively  hypodense lesion in the LEFT lobe of the thyroid gland. Recommend non-emergent thyroid ultrasound. Reference: J Am Coll Radiol. 2015 Feb;12(2): 143-50 PELVIS IMPRESSION: 1. No acute findings in the abdomen pelvis. 2. Stable fluid collection in the RIGHT ventral peritoneal space with hernia mesh. 3. Midline ventral hernia contains a loop of nonobstructed small bowel. 4. Nonobstructing LEFT renal calculi. 5. Foley catheter in collapsed bladder. 6. Posterior lumbar fusion and sacral fusion. Electronically Signed   By: SSuzy BouchardM.D.   On: 03/16/2022 16:17   CT ABDOMEN PELVIS W CONTRAST  Result Date: 03/16/2022 CLINICAL DATA:  Concern for pulmonary embolism. Respiratory distress. Food stuck in throat: Respiratory distress. Unresponsive EXAM: CT ANGIOGRAPHY CHEST CT ABDOMEN AND PELVIS WITH CONTRAST TECHNIQUE: Multidetector CT imaging of the chest was performed using the standard protocol during bolus administration of intravenous contrast. Multiplanar CT image reconstructions and MIPs were obtained to evaluate the vascular anatomy. Multidetector CT imaging of the abdomen and pelvis was performed using the standard protocol during bolus administration of intravenous contrast. RADIATION DOSE REDUCTION: This exam was performed according to the departmental dose-optimization program which includes automated exposure control, adjustment of the mA and/or kV according to patient size and/or use  of iterative reconstruction technique. CONTRAST:  110m OMNIPAQUE IOHEXOL 350 MG/ML SOLN COMPARISON:  None Available. FINDINGS: CTA CHEST FINDINGS Cardiovascular: No filling defects within the pulmonary arteries to suggest acute pulmonary embolism. Mediastinum/Nodes: Endotracheal tube within the mid trachea. No endobronchial foreign body. No axillary or supraclavicular adenopathy. No mediastinal or hilar adenopathy. No pericardial fluid. Esophagus normal. Relatively hypodense lesion in the LEFT lobe of the thyroid gland  measures 18 mm. Lungs/Pleura: Mild basilar atelectasis. No pulmonary infarction. No pleural fluid or pneumonia. Musculoskeletal: A single nondisplaced anterior RIGHT sixth rib fracture (image 101/4) Review of the MIP images confirms the above findings. CT ABDOMEN and PELVIS FINDINGS Lower chest: Lung bases are clear. Hepatobiliary: No focal hepatic lesion. Normal gallbladder. No biliary duct dilatation. Common bile duct is normal. Pancreas: Pancreas is normal. No ductal dilatation. No pancreatic inflammation. Spleen: Normal spleen Adrenals/urinary tract: Adrenal glands normal. Nonobstructing LEFT renal calculi. No obstructive uropathy. Foley catheter within collapsed bladder. Stomach/Bowel: Stomach, small bowel, appendix, and cecum are normal. The colon and rectosigmoid colon are normal. LEFT Vascular/Lymphatic: Abdominal aorta is normal caliber. No periportal or retroperitoneal adenopathy. No pelvic adenopathy. Reproductive: Prostate unremarkable Other: There is a well-circumscribed fluid collection in the RIGHT ventral peritoneal space measuring 17.5 cm x 10.2 cm. This collection is not changed from CT exam 10/11/2020. There is a wavy hernia mesh centrally within the collection. There is a midline hernia medial to the contain fluid collection contains loop of nonobstructed small bowel (image 67/4) Musculoskeletal: Posterior lumbar fusion and sacral fusion. No acute findings. Review of the MIP images confirms the above findings. IMPRESSION: CHEST IMPRESSION: 1. No evidence acute pulmonary embolism. 2. No endobronchial foreign body. 3. Mild basilar atelectasis. 4. Single nondisplaced anterior RIGHT sixth rib fracture. 5. Relatively hypodense lesion in the LEFT lobe of the thyroid gland. Recommend non-emergent thyroid ultrasound. Reference: J Am Coll Radiol. 2015 Feb;12(2): 143-50 PELVIS IMPRESSION: 1. No acute findings in the abdomen pelvis. 2. Stable fluid collection in the RIGHT ventral peritoneal space with  hernia mesh. 3. Midline ventral hernia contains a loop of nonobstructed small bowel. 4. Nonobstructing LEFT renal calculi. 5. Foley catheter in collapsed bladder. 6. Posterior lumbar fusion and sacral fusion. Electronically Signed   By: SSuzy BouchardM.D.   On: 03/16/2022 16:17   CT Head Wo Contrast  Result Date: 03/16/2022 CLINICAL DATA:  Altered mental status EXAM: CT HEAD WITHOUT CONTRAST TECHNIQUE: Contiguous axial images were obtained from the base of the skull through the vertex without intravenous contrast. RADIATION DOSE REDUCTION: This exam was performed according to the departmental dose-optimization program which includes automated exposure control, adjustment of the mA and/or kV according to patient size and/or use of iterative reconstruction technique. COMPARISON:  Head CT 08/19/2021.  MRI brain 08/19/2021. FINDINGS: Brain: No evidence of acute infarction, hemorrhage, hydrocephalus, extra-axial collection or mass lesion/mass effect. Again seen is mild diffuse atrophy. There is moderate periventricular deep white matter hypodensity similar to the prior study. Vascular: No hyperdense vessel or unexpected calcification. Skull: Normal. Negative for fracture or focal lesion. Sinuses/Orbits: No acute finding. Other: None. IMPRESSION: 1. No acute intracranial abnormality. 2. Stable mild diffuse atrophy and moderate periventricular deep white matter hypodensity, likely due to chronic small vessel ischemia. Electronically Signed   By: ARonney AstersM.D.   On: 03/16/2022 16:04   DG Chest Portable 1 View  Result Date: 03/16/2022 CLINICAL DATA:  Intubation EXAM: PORTABLE CHEST 1 VIEW COMPARISON:  Earlier 03/16/2022 FINDINGS: Interval placement of ET tube with tip 4.5 cm  above the carina. Underinflation. No consolidation, pneumothorax or effusion. Enlarged cardiopericardial silhouette without edema. Bronchovascular crowding. Tortuous aorta. Overlapping cardiac leads. IMPRESSION: 1. Interval placement of  ET tube with tip 4.5 cm above the carina. 2. Underinflation with bronchovascular crowding. Electronically Signed   By: Jill Side M.D.   On: 03/16/2022 15:04   DG Chest Portable 1 View  Result Date: 03/16/2022 CLINICAL DATA:  Choked on steak. EXAM: PORTABLE CHEST 1 VIEW COMPARISON:  Chest x-ray dated October 12, 2020. FINDINGS: The heart size and mediastinal contours are within normal limits. Both lungs are clear. The visualized skeletal structures are unremarkable. IMPRESSION: No active disease. Electronically Signed   By: Titus Dubin M.D.   On: 03/16/2022 14:20     ASSESSMENT AND PLAN    -Multidisciplinary rounds held today  Altered mental status with encephalopathy    Post respiratory distress with aspiration pneumonia    -Patient currently mechanically ventilated on PRVC -continue Full MV support -continue Bronchodilator Therapy -Wean Fio2 and PEEP as tolerated -will perform SAT/SBT when respiratory parameters are met -Unasyn for aspiration pneumonia   Aspiration of foreign body -EMS apparently were able to remove some food material including pieces of steak -Right lower lobe infiltrates noted suggestive of aspiration -Patient may require bronchoscopic airway inspection with foreign body aspiration, bronchial washings and microbiology with BAL -Will order COVID-19/RSV/influenza panel -CRP and procalcitonin -weaning ventilator today and performing SBT  Renal Failure-most likely due t transient hypotension -follow chem 7 -follow UO -continue Foley Catheter-assess need daily   Severe Parkinson's and dementia -Neurology consultation-appreciate input - minimal sedation to achieve a RASS goal: -1 Wake up assessment pending   ID -continue IV abx as prescibed -follow up cultures  GI/Nutrition GI PROPHYLAXIS as indicated DIET-->TF's as tolerated Constipation protocol as indicated  ENDO - ICU hypoglycemic\Hyperglycemia protocol -check FSBS per  protocol   ELECTROLYTES -follow labs as needed -replace as needed -pharmacy consultation   DVT/GI PRX ordered -SCDs  TRANSFUSIONS AS NEEDED MONITOR FSBS ASSESS the need for LABS as needed    Critical care provider statement:   Total critical care time: 33 minutes   Performed by: Lanney Gins MD   Critical care time was exclusive of separately billable procedures and treating other patients.   Critical care was necessary to treat or prevent imminent or life-threatening deterioration.   Critical care was time spent personally by me on the following activities: development of treatment plan with patient and/or surrogate as well as nursing, discussions with consultants, evaluation of patient's response to treatment, examination of patient, obtaining history from patient or surrogate, ordering and performing treatments and interventions, ordering and review of laboratory studies, ordering and review of radiographic studies, pulse oximetry and re-evaluation of patient's condition.      This document was prepared using Dragon voice recognition software and may include unintentional dictation errors.    Ottie Glazier, M.D.  Division of [facility]

## 2022-03-18 DIAGNOSIS — J9601 Acute respiratory failure with hypoxia: Secondary | ICD-10-CM

## 2022-03-18 DIAGNOSIS — T17908A Unspecified foreign body in respiratory tract, part unspecified causing other injury, initial encounter: Secondary | ICD-10-CM | POA: Diagnosis not present

## 2022-03-18 DIAGNOSIS — J690 Pneumonitis due to inhalation of food and vomit: Secondary | ICD-10-CM | POA: Insufficient documentation

## 2022-03-18 HISTORY — DX: Acute respiratory failure with hypoxia: J96.01

## 2022-03-18 LAB — CBC
HCT: 39 % (ref 39.0–52.0)
Hemoglobin: 12.3 g/dL — ABNORMAL LOW (ref 13.0–17.0)
MCH: 28.5 pg (ref 26.0–34.0)
MCHC: 31.5 g/dL (ref 30.0–36.0)
MCV: 90.5 fL (ref 80.0–100.0)
Platelets: 185 10*3/uL (ref 150–400)
RBC: 4.31 MIL/uL (ref 4.22–5.81)
RDW: 13.4 % (ref 11.5–15.5)
WBC: 14.5 10*3/uL — ABNORMAL HIGH (ref 4.0–10.5)
nRBC: 0 % (ref 0.0–0.2)

## 2022-03-18 LAB — GLUCOSE, CAPILLARY
Glucose-Capillary: 104 mg/dL — ABNORMAL HIGH (ref 70–99)
Glucose-Capillary: 106 mg/dL — ABNORMAL HIGH (ref 70–99)
Glucose-Capillary: 112 mg/dL — ABNORMAL HIGH (ref 70–99)
Glucose-Capillary: 128 mg/dL — ABNORMAL HIGH (ref 70–99)
Glucose-Capillary: 218 mg/dL — ABNORMAL HIGH (ref 70–99)
Glucose-Capillary: 98 mg/dL (ref 70–99)

## 2022-03-18 LAB — BASIC METABOLIC PANEL
Anion gap: 7 (ref 5–15)
BUN: 27 mg/dL — ABNORMAL HIGH (ref 8–23)
CO2: 25 mmol/L (ref 22–32)
Calcium: 8.6 mg/dL — ABNORMAL LOW (ref 8.9–10.3)
Chloride: 105 mmol/L (ref 98–111)
Creatinine, Ser: 1.37 mg/dL — ABNORMAL HIGH (ref 0.61–1.24)
GFR, Estimated: 56 mL/min — ABNORMAL LOW (ref >60)
Glucose, Bld: 114 mg/dL — ABNORMAL HIGH (ref 70–99)
Potassium: 4.2 mmol/L (ref 3.5–5.1)
Sodium: 137 mmol/L (ref 135–145)

## 2022-03-18 LAB — PHOSPHORUS: Phosphorus: 3.2 mg/dL (ref 2.5–4.6)

## 2022-03-18 LAB — MAGNESIUM: Magnesium: 1.9 mg/dL (ref 1.7–2.4)

## 2022-03-18 LAB — PROCALCITONIN: Procalcitonin: 3.43 ng/mL

## 2022-03-18 MED ORDER — TAMSULOSIN HCL 0.4 MG PO CAPS: 0.8000 mg | ORAL_CAPSULE | Freq: Every day | ORAL | Status: DC

## 2022-03-18 MED ORDER — ENSURE ENLIVE PO LIQD
237.0000 mL | Freq: Three times a day (TID) | ORAL | Status: DC
  Administered 2022-03-19 – 2022-03-20 (×3): 237 mL via ORAL

## 2022-03-18 MED ORDER — ACETAMINOPHEN 325 MG PO TABS
650.0000 mg | ORAL_TABLET | Freq: Four times a day (QID) | ORAL | Status: DC | PRN
  Administered 2022-03-18: 650 mg via ORAL
  Filled 2022-03-18: qty 2

## 2022-03-18 MED ORDER — AMLODIPINE BESYLATE 5 MG PO TABS
5.0000 mg | ORAL_TABLET | Freq: Every day | ORAL | Status: DC
  Administered 2022-03-18 – 2022-03-19 (×2): 5 mg via ORAL
  Filled 2022-03-18 (×3): qty 1

## 2022-03-18 MED ORDER — DULOXETINE HCL 30 MG PO CPEP: 60.0000 mg | ORAL_CAPSULE | Freq: Every day | ORAL | Status: DC

## 2022-03-18 MED ORDER — LINAGLIPTIN 5 MG PO TABS
5.0000 mg | ORAL_TABLET | Freq: Every day | ORAL | Status: DC
  Administered 2022-03-18 – 2022-03-20 (×3): 5 mg via ORAL
  Filled 2022-03-18 (×3): qty 1

## 2022-03-18 MED ORDER — HYDRALAZINE HCL 20 MG/ML IJ SOLN
10.0000 mg | Freq: Four times a day (QID) | INTRAMUSCULAR | Status: DC | PRN
  Administered 2022-03-18 – 2022-03-19 (×3): 10 mg via INTRAVENOUS
  Filled 2022-03-18 (×3): qty 1

## 2022-03-18 MED ORDER — SODIUM BICARBONATE 8.4 % IV SOLN
INTRAVENOUS | Status: AC
  Filled 2022-03-18: qty 50

## 2022-03-18 MED ORDER — SERTRALINE HCL 50 MG PO TABS
50.0000 mg | ORAL_TABLET | Freq: Every day | ORAL | Status: DC
  Administered 2022-03-18 – 2022-03-20 (×3): 50 mg via ORAL
  Filled 2022-03-18 (×3): qty 1

## 2022-03-18 MED ORDER — ADULT MULTIVITAMIN W/MINERALS CH
1.0000 | ORAL_TABLET | Freq: Every day | ORAL | Status: DC
  Administered 2022-03-19 – 2022-03-20 (×2): 1 via ORAL
  Filled 2022-03-18 (×2): qty 1

## 2022-03-18 MED ORDER — TRAZODONE HCL 50 MG PO TABS
50.0000 mg | ORAL_TABLET | Freq: Every evening | ORAL | Status: DC | PRN
  Administered 2022-03-18: 50 mg via ORAL
  Filled 2022-03-18: qty 1

## 2022-03-18 MED ORDER — LISINOPRIL 20 MG PO TABS
20.0000 mg | ORAL_TABLET | Freq: Every day | ORAL | Status: DC
  Administered 2022-03-18 – 2022-03-19 (×2): 20 mg via ORAL
  Filled 2022-03-18 (×2): qty 1

## 2022-03-18 MED ORDER — SODIUM CHLORIDE 0.9 % IV SOLN: INTRAVENOUS | Status: DC | PRN

## 2022-03-18 MED ORDER — TRAMADOL HCL 50 MG PO TABS
50.0000 mg | ORAL_TABLET | Freq: Two times a day (BID) | ORAL | Status: DC | PRN
  Administered 2022-03-18 – 2022-03-20 (×2): 50 mg via ORAL
  Filled 2022-03-18 (×2): qty 1

## 2022-03-18 MED ORDER — LABETALOL HCL 5 MG/ML IV SOLN
10.0000 mg | INTRAVENOUS | Status: DC | PRN
  Administered 2022-03-19 – 2022-03-20 (×3): 10 mg via INTRAVENOUS
  Filled 2022-03-18 (×4): qty 4

## 2022-03-18 NOTE — Progress Notes (Signed)
Received report on patient and notified of transfer to room 227 after shift change. Patient confused and pulling on lines and IV's. All PIV's removed by patient at shift change. BP elevated 170's, PRN's are ordered for elevated BP and PRN hydralazine was given as soon as new PIV was obtained. BP slightly improved. Patient has expressive aphagia and states that his son was here to visit today and told him his wife was here at the hospital.  This could be the reason for his confusion and pulling out PIV's Elevated temperature was treated with PRN tylenol prior to shift change, continue to monitor.

## 2022-03-18 NOTE — Progress Notes (Signed)
PHARMACY CONSULT NOTE - FOLLOW UP  Pharmacy Consult for Electrolyte Monitoring and Replacement   Recent Labs: Potassium (mmol/L)  Date Value  03/18/2022 4.2   Magnesium (mg/dL)  Date Value  03/18/2022 1.9   Calcium (mg/dL)  Date Value  03/18/2022 8.6 (L)   Albumin (g/dL)  Date Value  03/17/2022 3.0 (L)  03/16/2020 3.8   Phosphorus (mg/dL)  Date Value  03/18/2022 3.2   Sodium (mmol/L)  Date Value  03/18/2022 137  03/16/2020 138    Assessment: 67 year old male admitted with altered mental status, aspiration of foreign body, and AKI. PMH includes dementia, Parkinson's, CVA, uncontrolled HTN, T2DM.   Day 2 of antibiotics on Unasyn. Bronchoscopy to remove foreign body on 2/15.  Diet: NPO DVT PPX: SQH Q12Hr  Goal of Therapy:  Electrolytes within normal limits  Plan:  No replacement warranted today.  Follow up electrolytes tomorrow AM.   Wynelle Cleveland ,PharmD, BCPS Clinical Pharmacist 03/18/2022 8:52 AM

## 2022-03-18 NOTE — Evaluation (Signed)
Clinical/Bedside Swallow Evaluation Patient Details  Name: Keith Carter MRN: [id number] Date of Birth: 02/19/1955  Today's Date: 03/18/2022 Time: SLP Start Time (ACUTE ONLY): 49 SLP Stop Time (ACUTE ONLY): 1320 SLP Time Calculation (min) (ACUTE ONLY): 60 min  Past Medical History:  Past Medical History:  Diagnosis Date   Anxiety    Dyslipidemia    Enlarged prostate    Hypertension    Prediabetes    TIA (transient ischemic attack)    Past Surgical History:  Past Surgical History:  Procedure Laterality Date   BACK SURGERY     HERNIA REPAIR     PULMONARY THROMBECTOMY N/A 10/08/2020   Procedure: PULMONARY THROMBECTOMY;  Surgeon: Algernon Huxley, MD;  Location: [facility];  Service: Cardiovascular;  Laterality: N/A;   HPI:  Pt is a 67 y/o male with Dementia and severe Parkinson's disease, also has a history of TIA/CVA w/ aphasia, uncontrolled hypertension with episodes of hypertensive emergency, history of massive PE a year ago, poorly controlled diabetes with diabetic retinopathy, epidural lipomatosis, multilevel lumbar osteoarthritis with stenosis and scoliosis, obstructive uropathy with BPH, chronic pain syndrome, morbid Obesity with a BMI over 40 chronic aphasia and anxiety disorder who presents after an episode of apparent choking on Meat.  Evidently was found unresponsive by staff at [facility] facility who had initiated CPR.  EMS arrived on scene and reports that he had noted difficulty with ventilation. At this juncture, intubation was attempted and a large piece of Steak was removed from the patient's upper airway or trachea. He was then able to be ventilated but remained unresponsive requiring respirated assistance and 100% oxygenation  EMS was able to pull steak out of throat with forceps; unresponsive to EMS whole time per EMS. Upon arrival to ED, pt suctioned by staff as blood in mouth; repositioned; hernia noted at abdomen. Pt opened eyes to movement; pt  diaphoretic. Pt was then orally intubated for respiratory support; low temp noted. Pt was extubated ~24 hours later. Remained NPO until speech evaluation.   CT of Chest: Lungs/Pleura: Mild basilar atelectasis. No pulmonary infarction. No  pleural fluid or pneumonia. Lower chest: Lung bases are clear.    Assessment / Plan / Recommendation  Clinical Impression   Pt seen for BSE today. Pt awake, verbal to answer basic questions re: self; oriented to name and aware he was in the hospital. Pt did not know why he was in the hospital.  Pt on [place] O2 support, 2L; afebrile today; WBC slightly elevated.  Pt appears to present w/ grossly functional oropharyngeal phase swallowing w/ No overt pharyngeal phase dysphagia noted, mild oral phase deficit c/b lengthier mastication time w/ increased textured foods. No gross neuromuscular deficits noted; no OM weakness appreciated. Pt consumed po trials w/ No overt, clinical s/s of aspiration during po trials. OF NOTE: pt required full feeding support and min+ verbal/visual/tactile cues -- this need for support can increase risk for aspiration.  Pt appears at reduced risk for aspiration when following general aspiration precautions, using a modified diet consistency, and when given CAREFUL feeding support.   Pt does have challenging factors that could impact oropharyngeal swallowing to include Baseline Dementia/Cognitive decline (ANY Cognitive decline can impact her overall awareness/timing of swallow and safety during po tasks which increases risk for aspiration, choking), overall weakness, prior CVA w/ aphasia, and Dependency on being fed his meals, liquids, Pills. These factors can increase risk for aspiration, dysphagia as well as decreased oral intake overall.   During  po trials, pt consumed all consistencies fed to him(supported pt to hold cup for drinking) w/ No overt coughing, decline in vocal quality, or change in respiratory presentation during/post trials. O2 sats  remained 97-99%. Oral phase appeared grossly [facility] w/ timely bolus management, mastication, and control of bolus propulsion for A-P transfer for swallowing -- noted min increased/lengthy mastication of soft solids. Oral clearing achieved w/ all trial consistencies given Time and moistening soft solid foods cut small.  OM Exam appeared [facility] w/ no unilateral weakness noted. Speech Clear during few words spoken.   Recommend a Mech Soft consistency diet w/ MINCED meats, moistened foods and gravies; Thin liquids -- carefully monitor straw use, and pt should help to Hold Cup when drinking. Recommend general aspiration precautions, Careful Feeding Support monitoring pt clearing his mouth each bite/sip, reduce distractions during meals. Pills CRUSHED in Puree for safer, easier swallowing as able.  Education given on Pills in Puree and food consistencies/prep to NSG; general aspiration precautions and feeding support to NSG.   MD updated. ST services to monitor diet tolerance; MD to reconsult if any new needs arise. NSG updated, agreed. Recommend Dietician and Palliative Care f/u for support. Recommend f/u at next venue of care for Education w/ Staff members on feeding support/aspiration precautions as needed. SLP Visit Diagnosis: Dysphagia, oral phase (R13.11) (baseline Dementia; Parkinson's Dis., Obesity)    Aspiration Risk  Mild aspiration risk;Risk for inadequate nutrition/hydration (reduced following general aspiration precautions)    Diet Recommendation   a Mech Soft consistency diet w/ MINCED meats, moistened foods and gravies; Thin liquids -- carefully monitor straw use, and pt should help to Hold Cup when drinking. Recommend general aspiration precautions, Careful Feeding Support monitoring pt clearing his mouth each bite/sip, reduce distractions during meals.   Medication Administration: Crushed with puree    Other  Recommendations Recommended Consults: Consider GI evaluation (for any REFLUX issues;  Dietician for support; palliative care for [place]) Oral Care Recommendations: Oral care BID;Oral care before and after PO;Staff/trained caregiver to provide oral care    Recommendations for follow up therapy are one component of a multi-disciplinary discharge planning process, led by the attending physician.  Recommendations may be updated based on patient status, additional functional criteria and insurance authorization.  Follow up Recommendations No SLP follow up      Assistance Recommended at Discharge  Frequent for support feeding at meals  Functional Status Assessment Patient has had a recent decline in their functional status and demonstrates the ability to make significant improvements in function in a reasonable and predictable amount of time.  Frequency and Duration min 1 x/week  1 week       Prognosis Prognosis for improved oropharyngeal function: Fair Barriers to Reach Goals: Cognitive deficits;Language deficits;Time post onset;Severity of deficits;Behavior Barriers/Prognosis Comment: baseline Dementia, Dependent Feeder, Parkinson's Dis.      Swallow Study   General Date of Onset: 03/16/22 HPI: Pt is a 67 y/o male with Dementia and severe Parkinson's disease, also has a history of TIA/CVA w/ aphasia, uncontrolled hypertension with episodes of hypertensive emergency, history of massive PE a year ago, poorly controlled diabetes with diabetic retinopathy, epidural lipomatosis, multilevel lumbar osteoarthritis with stenosis and scoliosis, obstructive uropathy with BPH, chronic pain syndrome, morbid Obesity with a BMI over 40 chronic aphasia and anxiety disorder who presents after an episode of apparent choking on Meat.  Evidently was found unresponsive by staff at [facility] facility who had initiated CPR.  EMS arrived on scene and reports that  he had noted difficulty with ventilation. At this juncture, intubation was attempted and a large piece of Steak was removed from the  patient's upper airway or trachea. He was then able to be ventilated but remained unresponsive requiring respirated assistance and 100% oxygenation  EMS was able to pull steak out of throat with forceps; unresponsive to EMS whole time per EMS. Upon arrival to ED, pt suctioned by staff as blood in mouth; repositioned; hernia noted at abdomen. Pt opened eyes to movement; pt diaphoretic. Pt was then orally intubated for respiratory support; low temp noted. Pt was extubated ~24 hours later. Remained NPO until speech evaluation.   CT of Chest: Lungs/Pleura: Mild basilar atelectasis. No pulmonary infarction. No  pleural fluid or pneumonia. Lower chest: Lung bases are clear. Type of Study: Bedside Swallow Evaluation Previous Swallow Assessment: BSE in 2021; regular diet w/ thins Diet Prior to this Study: NPO Temperature Spikes Noted: No (wbc 14.5) Respiratory Status: Nasal cannula (2L) History of Recent Intubation: Yes Total duration of intubation (days): 1 days Date extubated: 03/17/22 Behavior/Cognition: Alert;Cooperative;Pleasant mood;Confused;Distractible;Requires cueing (baseline Dementia) Oral Cavity Assessment: Within Functional Limits Oral Care Completed by SLP: Yes Oral Cavity - Dentition: Adequate natural dentition;Missing dentition (few) Vision: Functional for self-feeding Self-Feeding Abilities: Able to feed self;Needs assist;Needs set up;Total assist (difficulty bringing hands to mouth) Patient Positioning: Upright in bed (needed full positioning support) Baseline Vocal Quality: Normal (few words spoken) Volitional Cough: Strong (mod cues) Volitional Swallow: Unable to elicit    Oral/Motor/Sensory Function Overall Oral Motor/Sensory Function: Within functional limits (no unilateral weakness noted)   Ice Chips Ice chips: Within functional limits Presentation: Spoon (fed; 3 trials) Other Comments: mastication of chips noted   Thin Liquid Thin Liquid: Within functional  limits Presentation: Cup;Self Fed;Straw (3 via cup; 10+ via straw) Other Comments: monitored bolus size to reduce any impulsive drinking    Nectar Thick Nectar Thick Liquid: Not tested   Honey Thick Honey Thick Liquid: Not tested   Puree Puree: Within functional limits Presentation: Spoon (fed; ~3 ozs)   Solid     Solid: Impaired (min) Presentation: Spoon (fed; 10 trials) Oral Phase Impairments: Impaired mastication (min lengthy time) Oral Phase Functional Implications: Impaired mastication;Prolonged oral transit (min lengthy time) Pharyngeal Phase Impairments:  (none)         Orinda Kenner, MS, CCC-SLP Speech Language Pathologist Rehab Services; [place] [phone] (ascom) Brewer Hitchman 03/18/2022,3:08 PM

## 2022-03-18 NOTE — Progress Notes (Signed)
Nutrition Follow Up Note   DOCUMENTATION CODES:   Obesity unspecified  INTERVENTION:   Ensure Enlive po TID, each supplement provides 350 kcal and 20 grams of protein.  Magic cup TID with meals, each supplement provides 290 kcal and 9 grams of protein  MVI po daily   Pt at moderate refeed risk; recommend monitor potassium, magnesium and phosphorus labs daily until stable  Daily weights   NUTRITION DIAGNOSIS:   Inadequate oral intake related to inability to eat (pt sedated and ventilated) as evidenced by NPO status. -resolving   GOAL:   Patient will meet greater than or equal to 90% of their needs -progressing   MONITOR:   PO intake, Supplement acceptance, Labs, Weight trends, I & O's, Skin  ASSESSMENT:   67 year old male with dementia secondary to severe Parkinson's disease, CVA, uncontrolled hypertension with episodes of hypertensive emergency, massive PE, poorly controlled diabetes with diabetic retinopathy, epidural lipomatosis, multilevel lumbar osteoarthritis with stenosis and scoliosis, obstructive uropathy with BPH, DDD, chronic pain syndrome, chronic aphasia and anxiety disorder who is admitted with aspiration PNA after choking.  Pt extubated yesterday. Pt seen by SLP today and placed on a mechanical soft diet. RD will add supplements and MVI to help pt meet his estimated needs. Pt is at refeed risk. Per chart, pt appears weight stable since admission. No BM noted since admission.   Medications reviewed and include: heparin, insulin, Na bicarbonate, unasyn  Labs reviewed: K 4.2 wnl, BUN 27(H), creat 1.37(H). P 3.2 wnl, Mg 1.9 wnl Wbc- 14.5(H) Cbgs- 112, 98 x 24 hrs   Diet Order:   Diet Order             DIET DYS 3 Room service appropriate? Yes with Assist; Fluid consistency: Thin  Diet effective now                  EDUCATION NEEDS:   No education needs have been identified at this time  Skin:  Skin Assessment: Reviewed RN Assessment  Last BM:   pta  Height:   Ht Readings from Last 1 Encounters:  03/16/22 6' (1.829 m)    Weight:   Wt Readings from Last 1 Encounters:  03/18/22 112.1 kg    Ideal Body Weight:  80.9 kg  BMI:  Body mass index is 33.52 kg/m.  Estimated Nutritional Needs:   Kcal:  2100-2400kcal/day  Protein:  105-120g/day  Fluid:  2.1-2.4L/day  Koleen Distance MS, RD, LDN Please refer to [facility] for RD and/or RD on-call/weekend/after hours pager

## 2022-03-18 NOTE — Progress Notes (Signed)
PROGRESS NOTE    Keith Carter  [id number] DOB: September 24, 1955 DOA: 03/16/2022 PCP: [name], NP   Brief Narrative:  This is a cognitively impaired 67 year old male with dementia and severe Parkinson's disease, he also has a history of CVA uncontrolled hypertension with episodes of hypertensive emergency, history of massive PE a year ago, poorly controlled diabetes with diabetic retinopathy, epidural lipomatosis, multilevel lumbar osteoarthritis with stenosis and scoliosis, obstructive uropathy with BPH, chronic pain syndrome, morbid obesity with a BMI over 40 chronic dysarthria and anxiety disorder and erectile dysfunction with CODE STATUS DNR who came in via emergency room due to unresponsive mental status.  In the ED patient was evaluated by neurologist for concern of anoxic brain injury.  Reportedly, patient had steak lodged in the airway which was removed by EMS personnel in the field.  Patient had CT head in the ER which shows mild diffuse atrophy and moderate periventricular deep white matter hypodensity due to small chronic vessel ischemia.  Per documentation ER provider had conversation with daughter who wanted patient intubated and he was admitted to ICU under PCCM on mechanical ventilator for further medical management post foreign body aspiration with respiratory failure and unresponsiveness.  Patient had CT angio PE protocol with no venous pulmonary thromboembolism noted however right lower lobe pneumonia suggestive of aspiration.  Patient then underwent bronchoscopy and some beef chunks in the left lung were removed with aspiration of bronchus, mucous plugging of RLL and RML with therapeutic aspiration of tracheobronchial tree.  BAL at RML due to mucopurulent phlegm.  He was then extubated extubation on 03/17/2022 and transferred under [facility] on 03/18/2022.   Assessment & Plan:   Principal Problem:   Foreign body aspiration Active Problems:   Dysarthria   Type 2 diabetes  mellitus with hyperlipidemia ([place])   Essential hypertension   Obesity (BMI 30-39.9)   Expressive aphasia   Acute respiratory failure with hypoxia (HCC)   Aspiration pneumonia ([place])  Acute respiratory failure with hypoxia secondary to foreign body aspiration/aspiration pneumonia: Patient was intubated on the field or in the ER and was initially admitted in ICU and subsequently underwent bronchoscopy as mentioned above and was extubated on 03/17/2022.  Patient is doing well currently on 2 L of oxygen.  We will continue Unasyn and continue to wean oxygen.  Follow cultures and tailor antibiotics accordingly.  Acute encephalopathy: Likely in the setting of acute respiratory failure with hypoxia.  Patient is alert and oriented to self.  This is his baseline.  Patient underwent EEG which was negative for any seizure activity.  Neurology signed off.  Type 2 diabetes mellitus: Hemoglobin A1c 6.5.  Patient on Januvia at home which is on hold.  Continue SSI, blood sugar controlled.  Severe Parkinson's dementia: At baseline.  Essential hypertension: Patient takes amlodipine and lisinopril at home.  Both on hold since patient is n.p.o.  I cannot find the reason for this patient to be NPO.  I will consult SLP and continue n.p.o. until he passes swallow.  In the meantime I will start him on hydralazine as needed.  BPH: On Flomax at home which is on hold due to n.p.o.  CKD stage IIIa: Patient does not appear to have new or acute AKI or renal insufficiency.  His creatinine appears to be at baseline.  History of previous CVA: Continue Plavix.  DVT prophylaxis: heparin injection 5,000 Units Start: 03/16/22 2200 SCDs Start: 03/16/22 1746   Code Status: DNR  Family Communication:  None present at bedside.  Status is: Inpatient Remains inpatient appropriate because: Still requiring oxygen and recovering from acute illness.  Will potentially be ready for discharge in next 1 to 2 days.   Estimated body mass  index is 33.52 kg/m as calculated from the following:   Height as of this encounter: 6' (1.829 m).   Weight as of this encounter: 112.1 kg.    Nutritional Assessment: Body mass index is 33.52 kg/m.Marland Kitchen Seen by dietician.  I agree with the assessment and plan as outlined below: Nutrition Status: Nutrition Problem: Inadequate oral intake Etiology: inability to eat (pt sedated and ventilated) Signs/Symptoms: NPO status    . Skin Assessment: I have examined the patient's skin and I agree with the wound assessment as performed by the wound care RN as outlined below:    Consultants:  PCCM and neurology  Procedures:  As above  Antimicrobials:  Anti-infectives (From admission, onward)    Start     Dose/Rate Route Frequency Ordered Stop   03/16/22 2100  Ampicillin-Sulbactam (UNASYN) 3 g in sodium chloride 0.9 % 100 mL IVPB        3 g 200 mL/hr over 30 Minutes Intravenous Every 6 hours 03/16/22 1953           Subjective: Patient seen and examined.  Patient does not has aphasia.  He has dysarthria.  He is able to tell me his name and date of birth.  He denies any complaints.  Appears comfortable.  He also said " thank you" at the end of my visit.  Objective: Vitals:   03/18/22 0700 03/18/22 0800 03/18/22 0900 03/18/22 1000  BP: 134/76 (!) 155/73 (!) 150/78 (!) 166/99  Pulse: 75 66    Resp: 19 14 17 16  $ Temp: 99.5 F (37.5 C) 99 F (37.2 C) 99.3 F (37.4 C) 99.5 F (37.5 C)  TempSrc:  Bladder Bladder Bladder  SpO2: 97% 97%    Weight:      Height:        Intake/Output Summary (Last 24 hours) at 03/18/2022 1108 Last data filed at 03/18/2022 1000 Gross per 24 hour  Intake 796.13 ml  Output 1590 ml  Net -793.87 ml   Filed Weights   03/16/22 1834 03/17/22 0333 03/18/22 0500  Weight: 112 kg 112.4 kg 112.1 kg    Examination:  General exam: Appears calm and comfortable  Respiratory system: Clear to auscultation. Respiratory effort normal. Cardiovascular system: S1 &  S2 heard, RRR. No JVD, murmurs, rubs, gallops or clicks. No pedal edema. Gastrointestinal system: Abdomen is nondistended, soft and nontender. No organomegaly or masses felt. Normal bowel sounds heard. Central nervous system: Alert and oriented to person.  No focal deficit. Extremities: Symmetric 5 x 5 power. Skin: No rashes, lesions or ulcers  Data Reviewed: I have personally reviewed following labs and imaging studies  CBC: Recent Labs  Lab 03/16/22 1344 03/17/22 0402 03/18/22 0530  WBC 12.0* 15.1* 14.5*  NEUTROABS  --  12.1*  --   HGB 14.6 12.3* 12.3*  HCT 44.6 38.4* 39.0  MCV 89.0 89.9 90.5  PLT 247 239 [id number]   Basic Metabolic Panel: Recent Labs  Lab 03/16/22 1344 03/17/22 0402 03/18/22 0530  NA 137 136 137  K 3.9 4.2 4.2  CL 104 103 105  CO2 15* 25 25  GLUCOSE 212* 144* 114*  BUN 24*  QUANTITY NOT SUFFICIENT, UNABLE TO PERFORM TEST 27* 27*  CREATININE 1.41*  QUANTITY NOT SUFFICIENT, UNABLE TO PERFORM TEST 1.33* 1.37*  CALCIUM 9.1 8.5* 8.6*  MG 1.8 1.8 1.9  PHOS 4.3 4.0 3.2   GFR: Estimated Creatinine Clearance: 66.7 mL/min (A) (by C-G formula based on SCr of 1.37 mg/dL (H)). Liver Function Tests: Recent Labs  Lab 03/16/22 1344 03/17/22 0402  AST 60* 36  ALT 61* 43  ALKPHOS 96 74  BILITOT 0.9 0.9  PROT 7.2 5.8*  ALBUMIN 3.7 3.0*   Recent Labs  Lab 03/16/22 1344  LIPASE 35   No results for input(s): "AMMONIA" in the last 168 hours. Coagulation Profile: No results for input(s): "INR", "PROTIME" in the last 168 hours. Cardiac Enzymes: No results for input(s): "CKTOTAL", "CKMB", "CKMBINDEX", "TROPONINI" in the last 168 hours. BNP (last 3 results) No results for input(s): "PROBNP" in the last 8760 hours. HbA1C: Recent Labs    03/16/22 2047  HGBA1C 6.5*   CBG: Recent Labs  Lab 03/17/22 1121 03/17/22 1544 03/17/22 1919 03/17/22 2319 03/18/22 0726  GLUCAP 93 84 115* 116* 98   Lipid Profile: Recent Labs    03/17/22 0402  TRIG 41    Thyroid Function Tests: Recent Labs    03/16/22 1344  TSH 1.981  FREET4 1.27*   Anemia Panel: No results for input(s): "VITAMINB12", "FOLATE", "FERRITIN", "TIBC", "IRON", "RETICCTPCT" in the last 72 hours. Sepsis Labs: Recent Labs  Lab 03/16/22 1344 03/16/22 1702 03/16/22 2047 03/17/22 0402 03/18/22 0530  PROCALCITON <0.10  --   --  4.77 3.43  LATICACIDVEN 4.0* 3.5* 2.2* 1.5  --     Recent Results (from the past 240 hour(s))  Culture, blood (Routine X 2) w Reflex to ID Panel     Status: None (Preliminary result)   Collection Time: 03/16/22  1:44 PM   Specimen: BLOOD  Result Value Ref Range Status   Specimen Description   Final    BLOOD RIGHT WRIST Performed at [facility], [address], [place], [place] [address]    Special Requests   Final    BOTTLES DRAWN AEROBIC AND ANAEROBIC Blood Culture adequate volume Performed at [facility], [address], [place], [place] [address]    Culture  Setup Time   Final    GRAM POSITIVE COCCI AEROBIC BOTTLE ONLY CRITICAL RESULT CALLED TO, READ BACK BY AND VERIFIED WITH: [place] ON 03/17/22 BY SS Performed at [facility], [place] [address], [place], [place] [address]    Culture GRAM POSITIVE COCCI  Final   Report Status PENDING  Incomplete  Culture, blood (Routine X 2) w Reflex to ID Panel     Status: None (Preliminary result)   Collection Time: 03/16/22  1:44 PM   Specimen: BLOOD  Result Value Ref Range Status   Specimen Description BLOOD RIGHT WRIST  Final   Special Requests   Final    BOTTLES DRAWN AEROBIC AND ANAEROBIC Blood Culture adequate volume   Culture   Final    NO GROWTH 2 DAYS Performed at [facility], [address] [place],  [address]    Report Status PENDING  Incomplete  Blood Culture ID Panel (Reflexed)     Status: Abnormal   Collection Time: 03/16/22  1:44 PM  Result Value Ref Range Status   Enterococcus faecalis NOT DETECTED NOT DETECTED  Final   Enterococcus Faecium NOT DETECTED NOT DETECTED Final   Listeria monocytogenes NOT DETECTED NOT DETECTED Final   Staphylococcus species DETECTED (A) NOT DETECTED Final    Comment: CRITICAL RESULT CALLED TO, READ BACK BY AND VERIFIED WITH: CAROLYN COULTER AT 1931  ON 03/17/22 BY SS    Staphylococcus aureus (BCID) NOT DETECTED NOT DETECTED Final   Staphylococcus epidermidis DETECTED (A) NOT DETECTED Final    Comment: CRITICAL RESULT CALLED TO, READ BACK BY AND VERIFIED WITH: CAROLYN COULTER AT 1931 ON 03/17/22 BY SS    Staphylococcus lugdunensis NOT DETECTED NOT DETECTED Final   Streptococcus species NOT DETECTED NOT DETECTED Final   Streptococcus agalactiae NOT DETECTED NOT DETECTED Final   Streptococcus pneumoniae NOT DETECTED NOT DETECTED Final   Streptococcus pyogenes NOT DETECTED NOT DETECTED Final   A.calcoaceticus-baumannii NOT DETECTED NOT DETECTED Final   Bacteroides fragilis NOT DETECTED NOT DETECTED Final   Enterobacterales NOT DETECTED NOT DETECTED Final   Enterobacter cloacae complex NOT DETECTED NOT DETECTED Final   Escherichia coli NOT DETECTED NOT DETECTED Final   Klebsiella aerogenes NOT DETECTED NOT DETECTED Final   Klebsiella oxytoca NOT DETECTED NOT DETECTED Final   Klebsiella pneumoniae NOT DETECTED NOT DETECTED Final   Proteus species NOT DETECTED NOT DETECTED Final   Salmonella species NOT DETECTED NOT DETECTED Final   Serratia marcescens NOT DETECTED NOT DETECTED Final   Haemophilus influenzae NOT DETECTED NOT DETECTED Final   Neisseria meningitidis NOT DETECTED NOT DETECTED Final   Pseudomonas aeruginosa NOT DETECTED NOT DETECTED Final   Stenotrophomonas maltophilia NOT DETECTED NOT DETECTED Final   Candida albicans NOT DETECTED NOT DETECTED Final   Candida auris NOT DETECTED NOT DETECTED Final   Candida glabrata NOT DETECTED NOT DETECTED Final   Candida krusei NOT DETECTED NOT DETECTED Final   Candida parapsilosis NOT DETECTED NOT DETECTED Final    Candida tropicalis NOT DETECTED NOT DETECTED Final   Cryptococcus neoformans/gattii NOT DETECTED NOT DETECTED Final   Methicillin resistance mecA/C NOT DETECTED NOT DETECTED Final    Comment: Performed at [facility], [address] [place], [place] [address]  MRSA Next Gen by PCR, Nasal     Status: None   Collection Time: 03/16/22  6:43 PM   Specimen: Nasal Mucosa; Nasal Swab  Result Value Ref Range Status   MRSA by PCR Next Gen NOT DETECTED NOT DETECTED Final    Comment: (NOTE) The GeneXpert MRSA Assay (FDA approved for NASAL specimens only), is one component of a comprehensive MRSA colonization surveillance program. It is not intended to diagnose MRSA infection nor to guide or monitor treatment for MRSA infections. Test performance is not FDA approved in patients less than 53 years old. Performed at [facility], [address] [place], [place] [address]   Culture, Respiratory w Gram Stain     Status: None (Preliminary result)   Collection Time: 03/16/22  7:32 PM   Specimen: SPU; Respiratory  Result Value Ref Range Status   Specimen Description   Final    SPUTUM Performed at [facility], [address] [place], [place] [address]    Special Requests   Final    SPUTUM Performed at [facility], [address] [place], [place] [address]    Gram Stain   Final    MODERATE WBC PRESENT, PREDOMINANTLY PMN MODERATE GRAM POSITIVE COCCI IN CHAINS FEW GRAM NEGATIVE RODS FEW GRAM POSITIVE COCCI IN CLUSTERS    Culture   Final    CULTURE REINCUBATED FOR BETTER GROWTH Performed at [facility], [place] [address] [place], [place] [address]    Report Status PENDING  Incomplete  Culture, BAL-quantitative w Gram Stain     Status: None (Preliminary result)   Collection Time: 03/17/22 11:05 AM  Specimen: Bronchoalveolar Lavage; Respiratory  Result Value Ref Range Status   Specimen Description   Final    BRONCHIAL ALVEOLAR LAVAGE Performed  at [facility], [address], [place], [place] [address]    Special Requests   Final    NONE Performed at [facility], [address] [place], [place] [address]    Gram Stain   Final    NO WBC SEEN MODERATE GRAM POSITIVE COCCI IN CLUSTERS FEW GRAM POSITIVE COCCI IN PAIRS FEW GRAM POSITIVE COCCI IN CHAINS RARE BUDDING YEAST SEEN    Culture   Final    CULTURE REINCUBATED FOR BETTER GROWTH Performed at [facility], [place] [address], [place], [place] [address]    Report Status PENDING  Incomplete     Radiology Studies: EEG adult  Result Date: 2022-03-27 Derek Jack, MD     2022-03-27  2:03 PM Routine EEG Report Achyut Cannady is a 67 y.o. male with a history of altered mental status who is undergoing an EEG to evaluate for seizures. Report: This EEG was acquired with electrodes placed according to the International 10-20 electrode system (including Fp1, Fp2, F3, F4, C3, C4, P3, P4, O1, O2, T3, T4, T5, T6, A1, A2, Fz, Cz, Pz). The following electrodes were missing or displaced: none. The best background was continuous at 5-7 Hz. This activity is reactive to stimulation. There was no sleep architecture. There was no focal slowing. There were no interictal epileptiform discharges. There were no electrographic seizures identified. Photic stimulation and hyperventilation were not performed. Impression and clinical correlation: This EEG was obtained sedated on versed and fentanyl and is abnormal due to mild-to-moderate diffuse slowing indicative of global cerebral dysfunction, medication effect, or both. Epileptiform abnormalities were not seen during this recording. Su Monks, MD [facility] [phone] If 7pm- 7am, please page neurology on call as listed in [place].   DG Abd 1 View  Result Date: 03/16/2022 CLINICAL DATA:  OG tube placement EXAM: ABDOMEN - 1 VIEW COMPARISON:  CT 03/16/2022 FINDINGS: Enteric tube is present with tip coiled in the left  upper quadrant consistent with location in the upper stomach. Visualized bowel gas pattern is normal with scattered gas present. Postoperative changes in the lumbar spine and right lower quadrant. Residual contrast material in the renal collecting systems. Infiltration or atelectasis suggested in the right lung base. IMPRESSION: Enteric tube tip is present, coiled in the left upper quadrant consistent with location in the upper stomach. Electronically Signed   By: Lucienne Capers M.D.   On: 03/16/2022 19:22   CT Angio Chest PE W and/or Wo Contrast  Result Date: 03/16/2022 CLINICAL DATA:  Concern for pulmonary embolism. Respiratory distress. Food stuck in throat: Respiratory distress. Unresponsive EXAM: CT ANGIOGRAPHY CHEST CT ABDOMEN AND PELVIS WITH CONTRAST TECHNIQUE: Multidetector CT imaging of the chest was performed using the standard protocol during bolus administration of intravenous contrast. Multiplanar CT image reconstructions and MIPs were obtained to evaluate the vascular anatomy. Multidetector CT imaging of the abdomen and pelvis was performed using the standard protocol during bolus administration of intravenous contrast. RADIATION DOSE REDUCTION: This exam was performed according to the departmental dose-optimization program which includes automated exposure control, adjustment of the mA and/or kV according to patient size and/or use of iterative reconstruction technique. CONTRAST:  155m OMNIPAQUE IOHEXOL 350 MG/ML SOLN COMPARISON:  None Available. FINDINGS: CTA CHEST FINDINGS Cardiovascular: No filling defects within the pulmonary arteries to suggest acute pulmonary embolism. Mediastinum/Nodes: Endotracheal tube within  the mid trachea. No endobronchial foreign body. No axillary or supraclavicular adenopathy. No mediastinal or hilar adenopathy. No pericardial fluid. Esophagus normal. Relatively hypodense lesion in the LEFT lobe of the thyroid gland measures 18 mm. Lungs/Pleura: Mild basilar  atelectasis. No pulmonary infarction. No pleural fluid or pneumonia. Musculoskeletal: A single nondisplaced anterior RIGHT sixth rib fracture (image 101/4) Review of the MIP images confirms the above findings. CT ABDOMEN and PELVIS FINDINGS Lower chest: Lung bases are clear. Hepatobiliary: No focal hepatic lesion. Normal gallbladder. No biliary duct dilatation. Common bile duct is normal. Pancreas: Pancreas is normal. No ductal dilatation. No pancreatic inflammation. Spleen: Normal spleen Adrenals/urinary tract: Adrenal glands normal. Nonobstructing LEFT renal calculi. No obstructive uropathy. Foley catheter within collapsed bladder. Stomach/Bowel: Stomach, small bowel, appendix, and cecum are normal. The colon and rectosigmoid colon are normal. LEFT Vascular/Lymphatic: Abdominal aorta is normal caliber. No periportal or retroperitoneal adenopathy. No pelvic adenopathy. Reproductive: Prostate unremarkable Other: There is a well-circumscribed fluid collection in the RIGHT ventral peritoneal space measuring 17.5 cm x 10.2 cm. This collection is not changed from CT exam 10/11/2020. There is a wavy hernia mesh centrally within the collection. There is a midline hernia medial to the contain fluid collection contains loop of nonobstructed small bowel (image 67/4) Musculoskeletal: Posterior lumbar fusion and sacral fusion. No acute findings. Review of the MIP images confirms the above findings. IMPRESSION: CHEST IMPRESSION: 1. No evidence acute pulmonary embolism. 2. No endobronchial foreign body. 3. Mild basilar atelectasis. 4. Single nondisplaced anterior RIGHT sixth rib fracture. 5. Relatively hypodense lesion in the LEFT lobe of the thyroid gland. Recommend non-emergent thyroid ultrasound. Reference: J Am Coll Radiol. 2015 Feb;12(2): 143-50 PELVIS IMPRESSION: 1. No acute findings in the abdomen pelvis. 2. Stable fluid collection in the RIGHT ventral peritoneal space with hernia mesh. 3. Midline ventral hernia contains  a loop of nonobstructed small bowel. 4. Nonobstructing LEFT renal calculi. 5. Foley catheter in collapsed bladder. 6. Posterior lumbar fusion and sacral fusion. Electronically Signed   By: Suzy Bouchard M.D.   On: 03/16/2022 16:17   CT ABDOMEN PELVIS W CONTRAST  Result Date: 03/16/2022 CLINICAL DATA:  Concern for pulmonary embolism. Respiratory distress. Food stuck in throat: Respiratory distress. Unresponsive EXAM: CT ANGIOGRAPHY CHEST CT ABDOMEN AND PELVIS WITH CONTRAST TECHNIQUE: Multidetector CT imaging of the chest was performed using the standard protocol during bolus administration of intravenous contrast. Multiplanar CT image reconstructions and MIPs were obtained to evaluate the vascular anatomy. Multidetector CT imaging of the abdomen and pelvis was performed using the standard protocol during bolus administration of intravenous contrast. RADIATION DOSE REDUCTION: This exam was performed according to the departmental dose-optimization program which includes automated exposure control, adjustment of the mA and/or kV according to patient size and/or use of iterative reconstruction technique. CONTRAST:  178m OMNIPAQUE IOHEXOL 350 MG/ML SOLN COMPARISON:  None Available. FINDINGS: CTA CHEST FINDINGS Cardiovascular: No filling defects within the pulmonary arteries to suggest acute pulmonary embolism. Mediastinum/Nodes: Endotracheal tube within the mid trachea. No endobronchial foreign body. No axillary or supraclavicular adenopathy. No mediastinal or hilar adenopathy. No pericardial fluid. Esophagus normal. Relatively hypodense lesion in the LEFT lobe of the thyroid gland measures 18 mm. Lungs/Pleura: Mild basilar atelectasis. No pulmonary infarction. No pleural fluid or pneumonia. Musculoskeletal: A single nondisplaced anterior RIGHT sixth rib fracture (image 101/4) Review of the MIP images confirms the above findings. CT ABDOMEN and PELVIS FINDINGS Lower chest: Lung bases are clear. Hepatobiliary: No  focal hepatic lesion. Normal gallbladder. No biliary duct dilatation.  Common bile duct is normal. Pancreas: Pancreas is normal. No ductal dilatation. No pancreatic inflammation. Spleen: Normal spleen Adrenals/urinary tract: Adrenal glands normal. Nonobstructing LEFT renal calculi. No obstructive uropathy. Foley catheter within collapsed bladder. Stomach/Bowel: Stomach, small bowel, appendix, and cecum are normal. The colon and rectosigmoid colon are normal. LEFT Vascular/Lymphatic: Abdominal aorta is normal caliber. No periportal or retroperitoneal adenopathy. No pelvic adenopathy. Reproductive: Prostate unremarkable Other: There is a well-circumscribed fluid collection in the RIGHT ventral peritoneal space measuring 17.5 cm x 10.2 cm. This collection is not changed from CT exam 10/11/2020. There is a wavy hernia mesh centrally within the collection. There is a midline hernia medial to the contain fluid collection contains loop of nonobstructed small bowel (image 67/4) Musculoskeletal: Posterior lumbar fusion and sacral fusion. No acute findings. Review of the MIP images confirms the above findings. IMPRESSION: CHEST IMPRESSION: 1. No evidence acute pulmonary embolism. 2. No endobronchial foreign body. 3. Mild basilar atelectasis. 4. Single nondisplaced anterior RIGHT sixth rib fracture. 5. Relatively hypodense lesion in the LEFT lobe of the thyroid gland. Recommend non-emergent thyroid ultrasound. Reference: J Am Coll Radiol. 2015 Feb;12(2): 143-50 PELVIS IMPRESSION: 1. No acute findings in the abdomen pelvis. 2. Stable fluid collection in the RIGHT ventral peritoneal space with hernia mesh. 3. Midline ventral hernia contains a loop of nonobstructed small bowel. 4. Nonobstructing LEFT renal calculi. 5. Foley catheter in collapsed bladder. 6. Posterior lumbar fusion and sacral fusion. Electronically Signed   By: Suzy Bouchard M.D.   On: 03/16/2022 16:17   CT Head Wo Contrast  Result Date: 03/16/2022 CLINICAL  DATA:  Altered mental status EXAM: CT HEAD WITHOUT CONTRAST TECHNIQUE: Contiguous axial images were obtained from the base of the skull through the vertex without intravenous contrast. RADIATION DOSE REDUCTION: This exam was performed according to the departmental dose-optimization program which includes automated exposure control, adjustment of the mA and/or kV according to patient size and/or use of iterative reconstruction technique. COMPARISON:  Head CT 08/19/2021.  MRI brain 08/19/2021. FINDINGS: Brain: No evidence of acute infarction, hemorrhage, hydrocephalus, extra-axial collection or mass lesion/mass effect. Again seen is mild diffuse atrophy. There is moderate periventricular deep white matter hypodensity similar to the prior study. Vascular: No hyperdense vessel or unexpected calcification. Skull: Normal. Negative for fracture or focal lesion. Sinuses/Orbits: No acute finding. Other: None. IMPRESSION: 1. No acute intracranial abnormality. 2. Stable mild diffuse atrophy and moderate periventricular deep white matter hypodensity, likely due to chronic small vessel ischemia. Electronically Signed   By: Ronney Asters M.D.   On: 03/16/2022 16:04   DG Chest Portable 1 View  Result Date: 03/16/2022 CLINICAL DATA:  Intubation EXAM: PORTABLE CHEST 1 VIEW COMPARISON:  Earlier 03/16/2022 FINDINGS: Interval placement of ET tube with tip 4.5 cm above the carina. Underinflation. No consolidation, pneumothorax or effusion. Enlarged cardiopericardial silhouette without edema. Bronchovascular crowding. Tortuous aorta. Overlapping cardiac leads. IMPRESSION: 1. Interval placement of ET tube with tip 4.5 cm above the carina. 2. Underinflation with bronchovascular crowding. Electronically Signed   By: Jill Side M.D.   On: 03/16/2022 15:04   DG Chest Portable 1 View  Result Date: 03/16/2022 CLINICAL DATA:  Choked on steak. EXAM: PORTABLE CHEST 1 VIEW COMPARISON:  Chest x-ray dated October 12, 2020. FINDINGS: The  heart size and mediastinal contours are within normal limits. Both lungs are clear. The visualized skeletal structures are unremarkable. IMPRESSION: No active disease. Electronically Signed   By: Titus Dubin M.D.   On: 03/16/2022 14:20    Scheduled  Meds:  Chlorhexidine Gluconate Cloth  6 each Topical Q0600   clopidogrel  75 mg Per Tube Daily   heparin  5,000 Units Subcutaneous Q12H   insulin aspart  0-20 Units Subcutaneous Q4H   sodium bicarbonate       Continuous Infusions:  sodium chloride Stopped (03/16/22 1940)   sodium chloride 10 mL/hr at 03/18/22 1000   ampicillin-sulbactam (UNASYN) IV Stopped (03/18/22 0920)     LOS: 2 days   Darliss Cheney, MD [facility]  03/18/2022, 11:08 AM   *Please note that this is a verbal dictation therefore any spelling or grammatical errors are due to the "[place] [facility]" system interpretation.  Please page via [place] and do not message via secure chat for urgent patient care matters. Secure chat can be used for non urgent patient care matters.  How to contact the [facility] Attending or Consulting provider [place] or covering provider during after hours [place], for this patient?  Check the care team in [facility] and look for a) attending/consulting [facility] provider listed and b) the [facility] team listed. Page or secure chat [phone]. Log into [email or web address] and use [place]'s universal password to access. If you do not have the password, please contact the hospital operator. Locate the [facility] provider you are looking for under [facility] and page to a number that you can be directly reached. If you still have difficulty reaching the provider, please page the [facility] (Director on Call) for the Hospitalists listed on amion for assistance.

## 2022-03-19 ENCOUNTER — Encounter: Payer: Self-pay | Admitting: Pulmonary Disease

## 2022-03-19 ENCOUNTER — Other Ambulatory Visit: Payer: Self-pay

## 2022-03-19 DIAGNOSIS — T17908A Unspecified foreign body in respiratory tract, part unspecified causing other injury, initial encounter: Secondary | ICD-10-CM | POA: Diagnosis not present

## 2022-03-19 LAB — T3, FREE: T3, Free: 2.9 pg/mL (ref 2.0–4.4)

## 2022-03-19 LAB — CBC WITH DIFFERENTIAL/PLATELET
Abs Immature Granulocytes: 0.07 10*3/uL (ref 0.00–0.07)
Basophils Absolute: 0.1 10*3/uL (ref 0.0–0.1)
Basophils Relative: 1 %
Eosinophils Absolute: 0.1 10*3/uL (ref 0.0–0.5)
Eosinophils Relative: 1 %
HCT: 36.7 % — ABNORMAL LOW (ref 39.0–52.0)
Hemoglobin: 12 g/dL — ABNORMAL LOW (ref 13.0–17.0)
Immature Granulocytes: 1 %
Lymphocytes Relative: 11 %
Lymphs Abs: 1.3 10*3/uL (ref 0.7–4.0)
MCH: 29 pg (ref 26.0–34.0)
MCHC: 32.7 g/dL (ref 30.0–36.0)
MCV: 88.6 fL (ref 80.0–100.0)
Monocytes Absolute: 0.9 10*3/uL (ref 0.1–1.0)
Monocytes Relative: 7 %
Neutro Abs: 9.6 10*3/uL — ABNORMAL HIGH (ref 1.7–7.7)
Neutrophils Relative %: 79 %
Platelets: 190 10*3/uL (ref 150–400)
RBC: 4.14 MIL/uL — ABNORMAL LOW (ref 4.22–5.81)
RDW: 13.2 % (ref 11.5–15.5)
WBC: 12 10*3/uL — ABNORMAL HIGH (ref 4.0–10.5)
nRBC: 0 % (ref 0.0–0.2)

## 2022-03-19 LAB — GLUCOSE, CAPILLARY
Glucose-Capillary: 128 mg/dL — ABNORMAL HIGH (ref 70–99)
Glucose-Capillary: 116 mg/dL — ABNORMAL HIGH (ref 70–99)
Glucose-Capillary: 195 mg/dL — ABNORMAL HIGH (ref 70–99)
Glucose-Capillary: 124 mg/dL — ABNORMAL HIGH (ref 70–99)
Glucose-Capillary: 171 mg/dL — ABNORMAL HIGH (ref 70–99)
Glucose-Capillary: 184 mg/dL — ABNORMAL HIGH (ref 70–99)

## 2022-03-19 LAB — CULTURE, BLOOD (ROUTINE X 2): Special Requests: ADEQUATE

## 2022-03-19 LAB — TROPONIN I (HIGH SENSITIVITY)
Troponin I (High Sensitivity): 22 ng/L — ABNORMAL HIGH (ref <18)
Troponin I (High Sensitivity): 26 ng/L — ABNORMAL HIGH (ref <18)

## 2022-03-19 LAB — BASIC METABOLIC PANEL
Anion gap: 11 (ref 5–15)
BUN: 21 mg/dL (ref 8–23)
CO2: 24 mmol/L (ref 22–32)
Calcium: 8.6 mg/dL — ABNORMAL LOW (ref 8.9–10.3)
Chloride: 99 mmol/L (ref 98–111)
Creatinine, Ser: 1.1 mg/dL (ref 0.61–1.24)
GFR, Estimated: >60 mL/min (ref >60)
Glucose, Bld: 124 mg/dL — ABNORMAL HIGH (ref 70–99)
Potassium: 3.3 mmol/L — ABNORMAL LOW (ref 3.5–5.1)
Sodium: 134 mmol/L — ABNORMAL LOW (ref 135–145)

## 2022-03-19 LAB — PHOSPHORUS: Phosphorus: 2.6 mg/dL (ref 2.5–4.6)

## 2022-03-19 LAB — MAGNESIUM: Magnesium: 1.7 mg/dL (ref 1.7–2.4)

## 2022-03-19 MED ORDER — INSULIN ASPART 100 UNIT/ML IJ SOLN
0.0000 [IU] | Freq: Three times a day (TID) | INTRAMUSCULAR | Status: DC
  Administered 2022-03-19: 1 [IU] via SUBCUTANEOUS
  Administered 2022-03-20 (×2): 3 [IU] via SUBCUTANEOUS
  Filled 2022-03-19 (×3): qty 1

## 2022-03-19 MED ORDER — POTASSIUM CHLORIDE CRYS ER 20 MEQ PO TBCR
40.0000 meq | EXTENDED_RELEASE_TABLET | ORAL | Status: AC
  Administered 2022-03-19 (×2): 40 meq via ORAL
  Filled 2022-03-19 (×2): qty 2

## 2022-03-19 MED ORDER — METOPROLOL TARTRATE 5 MG/5ML IV SOLN
5.0000 mg | INTRAVENOUS | Status: DC | PRN
  Administered 2022-03-19: 5 mg via INTRAVENOUS
  Filled 2022-03-19 (×2): qty 5

## 2022-03-19 MED ORDER — VANCOMYCIN HCL 1750 MG/350ML IV SOLN
1750.0000 mg | INTRAVENOUS | Status: DC
  Administered 2022-03-19: 1750 mg via INTRAVENOUS
  Filled 2022-03-19: qty 350

## 2022-03-19 MED ORDER — VANCOMYCIN HCL 2000 MG/400ML IV SOLN
2000.0000 mg | Freq: Once | INTRAVENOUS | Status: AC
  Administered 2022-03-19: 2000 mg via INTRAVENOUS
  Filled 2022-03-19: qty 400

## 2022-03-19 MED ORDER — INSULIN ASPART 100 UNIT/ML IJ SOLN: 0.0000 [IU] | Freq: Every day | INTRAMUSCULAR | Status: DC

## 2022-03-19 MED ORDER — CHLORHEXIDINE GLUCONATE CLOTH 2 % EX PADS
6.0000 | MEDICATED_PAD | Freq: Every day | CUTANEOUS | Status: DC
  Administered 2022-03-19 – 2022-03-22 (×4): 6 via TOPICAL

## 2022-03-19 MED ORDER — MAGNESIUM SULFATE 2 GM/50ML IV SOLN
2.0000 g | Freq: Once | INTRAVENOUS | Status: AC
  Administered 2022-03-19: 2 g via INTRAVENOUS
  Filled 2022-03-19: qty 50

## 2022-03-19 NOTE — Progress Notes (Signed)
   03/19/22 1747  Assess: MEWS Score  Temp 99 F (37.2 C)  BP (!) 155/113  MAP (mmHg) 126  Pulse Rate (!) 183  Resp 20  Level of Consciousness Alert  SpO2 97 %  O2 Device Nasal Cannula  Patient Activity (if Appropriate) In bed  Assess: MEWS Score  MEWS Temp 0  MEWS Systolic 0  MEWS Pulse 3  MEWS RR 0  MEWS LOC 0  MEWS Score 3  MEWS Score Color Yellow  Assess: if the MEWS score is Yellow or Red  Were vital signs taken at a resting state? Yes  Focused Assessment Change from prior assessment (see assessment flowsheet)  Does the patient meet 2 or more of the SIRS criteria? No  MEWS guidelines implemented  Yes, yellow  Treat  MEWS Interventions Considered administering scheduled or prn medications/treatments as ordered  Take Vital Signs  Increase Vital Sign Frequency  Yellow: Q2hr x1, continue Q4hrs until patient remains green for 12hrs  Escalate  MEWS: Escalate Yellow: Discuss with charge nurse and consider notifying provider and/or RRT  Notify: Charge Nurse/RN  Name of Charge Nurse/RN Notified Misty RN  Provider Notification  Provider Name/Title [place]  Date Provider Notified 03/19/22  Time Provider Notified 1747  Method of Notification Page  Notification Reason Change in status;New onset of dysrhythmia  Provider response See new orders  Date of Provider Response 03/19/22  Time of Provider Response [id number]  Notify: Rapid Response  Name of Rapid Response RN Notified Skeet Latch RN  Date Rapid Response Notified 03/19/22  Time Rapid Response Notified [id number]  Assess: SIRS CRITERIA  SIRS Temperature  0  SIRS Pulse 1  SIRS Respirations  0  SIRS WBC 0  SIRS Score Sum  1   Patient's HR improved to 84 after 5 mg lopressor was administered. Also, patient verbalizes relief from chest pain. Daughter at bedside. Updated.
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
## 2022-03-19 NOTE — Progress Notes (Signed)
Rapid Response Event Note   Reason for Call : HR 183, patient complains of Chest pain   Initial Focused Assessment: VS checked HR 183, BP 155/113. Patient verbalizes sharp pain to chest.       Interventions: 2L O2 adm. RRT at bedside. Lopressor IV administered per MAR. EKG obtained and labs drawn.    Plan of Care:  Patient HR in 80s after interventions. Telemetry in place.  Family at bedside. VS frequency increased (see MEWS)     MD Notified: Pahwani Call Time:1747 Arrival Time: [id number] End Time:  Wynema Birch, RN

## 2022-03-19 NOTE — Progress Notes (Signed)
Call from [facility], [place]. Updated that pt was in room 227, she states his wife has been asking about him, she is glad to hear he is off the ventilator.

## 2022-03-19 NOTE — Progress Notes (Signed)
Pharmacy Antibiotic Note  Keith Carter is a 67 y.o. 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  Pharmacy has been consulted for unasyn and vancomycin dosing.  Plan:  1) continue Unasyn 3 grams IV q6h  2) start vancomycin 2000 mg IV x 1 then 1750 mg IV every 24 hours Goal AUC 400-550. Expected AUC: 510.1 SCr used: 1.10 mg/dL Ke 0.063 h-1, T1/2 11h   Height: 6' (182.9 cm) Weight: 109.2 kg (240 lb 11.9 oz) IBW/kg (Calculated) : 77.6  Temp (24hrs), Avg:100 F (37.8 C), Min:97.9 F (36.6 C), Max:100.9 F (38.3 C)  Recent Labs  Lab 03/16/22 1344 03/16/22 1702 03/16/22 2047 03/17/22 0402 03/18/22 0530 03/19/22 0500  WBC 12.0*  --   --  15.1* 14.5* 12.0*  CREATININE 1.41*  QUANTITY NOT SUFFICIENT, UNABLE TO PERFORM TEST  --   --  1.33* 1.37* 1.10  LATICACIDVEN 4.0* 3.5* 2.2* 1.5  --   --      Estimated Creatinine Clearance: 82 mL/min (by C-G formula based on SCr of 1.1 mg/dL).    No Known Allergies  Antimicrobials this admission: Unasyn  2/14 >>   vancomycin 2/17  >>    Microbiology results: 2/14 BCx: 1/4 S epidermidis 2/15 bronchial lavage:: GPC pairs, chains, clusters  2/14 Sputum:trach aspirate: GNR, GPC chains, clusters 2/14 MRSA PCR: negative  Thank you for allowing pharmacy to be a part of this patient's care.  Dallie Piles 03/19/2022 8:14 AM

## 2022-03-19 NOTE — Significant Event (Signed)
Rapid Response Event Note   Reason for Call : called RRT for pts HR in 180's, pt c/o chest pain.   Initial Focused Assessment: Pt lying in bed, c/o new onset chest pain, tele showing HR 185      Interventions: Notified Dr Einar Grad, and Dr Billie Ruddy, Dr Billie Ruddy to bedside. Stat EKG, 5 mg metoprolol stat, and stat labs ordered.   Plan of Care: as above, pt's HR returned to SR in the 80's after metoprolol dose... RN Geologist, engineering, Misty to call for further needs    Event Summary: as above  MD Notified: Einar Grad and Billie Ruddy Call [place] A, RN

## 2022-03-19 NOTE — Progress Notes (Signed)
PROGRESS NOTE    Kaston Spata List  [id number] DOB: January 01, 1956 DOA: 03/16/2022 PCP: [name], NP   Brief Narrative:  This is a cognitively impaired 67 year old male with dementia and severe Parkinson's disease, he also has a history of CVA uncontrolled hypertension with episodes of hypertensive emergency, history of massive PE a year ago, poorly controlled diabetes with diabetic retinopathy, epidural lipomatosis, multilevel lumbar osteoarthritis with stenosis and scoliosis, obstructive uropathy with BPH, chronic pain syndrome, morbid obesity with a BMI over 40 chronic dysarthria and anxiety disorder and erectile dysfunction with CODE STATUS DNR who came in via emergency room due to unresponsive mental status.  In the ED patient was evaluated by neurologist for concern of anoxic brain injury.  Reportedly, patient had steak lodged in the airway which was removed by EMS personnel in the field.  Patient had CT head in the ER which shows mild diffuse atrophy and moderate periventricular deep white matter hypodensity due to small chronic vessel ischemia.  Per documentation ER provider had conversation with daughter who wanted patient intubated and he was admitted to ICU under PCCM on mechanical ventilator for further medical management post foreign body aspiration with respiratory failure and unresponsiveness.  Patient had CT angio PE protocol with no venous pulmonary thromboembolism noted however right lower lobe pneumonia suggestive of aspiration.  Patient then underwent bronchoscopy and some beef chunks in the left lung were removed with aspiration of bronchus, mucous plugging of RLL and RML with therapeutic aspiration of tracheobronchial tree.  BAL at RML due to mucopurulent phlegm.  He was then extubated extubation on 03/17/2022 and transferred under [place] on 03/18/2022.   Assessment & Plan:   Principal Problem:   Foreign body aspiration Active Problems:   Dysarthria   Type 2 diabetes  mellitus with hyperlipidemia ([place])   Essential hypertension   Obesity (BMI 30-39.9)   Expressive aphasia   Acute respiratory failure with hypoxia (HCC)   Aspiration pneumonia ([place])  Acute respiratory failure with hypoxia secondary to foreign body aspiration/aspiration pneumonia: Patient was intubated on the field or in the ER and was initially admitted in ICU and subsequently underwent bronchoscopy as mentioned above and was extubated on 03/17/2022.  Patient's respiratory culture is growing gram-positive cocci in chains and clusters as well as gram-negative rods.  Patient is on Unasyn, he has started to have fever, last temperature spike was 100.6 at 8 PM on 03/18/2022.  Will need MRSA coverage, will start on vancomycin.  He is saturating 94% on room air.  Acute encephalopathy: Likely in the setting of acute respiratory failure with hypoxia.  Patient is alert and oriented to self.  This is his baseline.  Patient underwent EEG which was negative for any seizure activity.  Neurology signed off.  Type 2 diabetes mellitus: Hemoglobin A1c 6.5.  Patient on Januvia at home which is on hold.  Continue SSI, blood sugar controlled.  Severe Parkinson's dementia: At baseline.  Essential hypertension: Blood pressure slightly elevated at times, continue home medications which includes amlodipine and lisinopril, as well as continue as needed labetalol and hydralazine.   BPH: On Flomax at home which is on hold due to n.p.o.  CKD stage IIIa: Patient does not appear to have new or acute AKI or renal insufficiency.  His creatinine appears to be at baseline.  History of previous CVA: Continue Plavix.  DVT prophylaxis: heparin injection 5,000 Units Start: 03/16/22 2200 SCDs Start: 03/16/22 1746   Code Status: DNR  Family Communication:  None present at bedside.  Status is: Inpatient Remains inpatient appropriate because: 67 Patient febrile.   Estimated body mass index is 32.65 kg/m as calculated from the  following:   Height as of this encounter: 6' (1.829 m).   Weight as of this encounter: 109.2 kg.    Nutritional Assessment: Body mass index is 32.65 kg/m.Marland Kitchen Seen by dietician.  I agree with the assessment and plan as outlined below: Nutrition Status: Nutrition Problem: Inadequate oral intake Etiology: inability to eat (pt sedated and ventilated) Signs/Symptoms: NPO status    . Skin Assessment: I have examined the patient's skin and I agree with the wound assessment as performed by the wound care RN as outlined below:    Consultants:  PCCM and neurology  Procedures:  As above  Antimicrobials:  Anti-infectives (From admission, onward)    Start     Dose/Rate Route Frequency Ordered Stop   03/19/22 2200  vancomycin (VANCOREADY) IVPB 1750 mg/350 mL        1,750 mg 175 mL/hr over 120 Minutes Intravenous Every 24 hours 03/19/22 0826     03/19/22 0915  vancomycin (VANCOREADY) IVPB 2000 mg/400 mL        2,000 mg 200 mL/hr over 120 Minutes Intravenous  Once 03/19/22 0819     03/16/22 2100  Ampicillin-Sulbactam (UNASYN) 3 g in sodium chloride 0.9 % 100 mL IVPB        3 g 200 mL/hr over 30 Minutes Intravenous Every 6 hours 03/16/22 1953           Subjective:  Patient seen and examined.  He is fully alert and oriented to place and person as well.  He has no complaints but he says that he is " Stuck here" because he was placed on hand mittens.  He was asking where his wife is.  He was requesting someone calls his wife.  I have notified.  RN so they can connect with his wife and the patient.  Objective: Vitals:   03/18/22 2046 03/19/22 0408 03/19/22 0438 03/19/22 0756  BP: (!) 160/85 (!) 158/94  (!) 160/95  Pulse: 87 73  94  Resp: 20 16  16  $ Temp: 98.8 F (37.1 C) 97.9 F (36.6 C)  98.1 F (36.7 C)  TempSrc: Oral     SpO2: 100% 98%  94%  Weight:   109.2 kg   Height:        Intake/Output Summary (Last 24 hours) at 03/19/2022 1017 Last data filed at 03/19/2022 0511 Gross  per 24 hour  Intake 716.42 ml  Output 2025 ml  Net -1308.58 ml    Filed Weights   03/17/22 0333 03/18/22 0500 03/19/22 0438  Weight: 112.4 kg 112.1 kg 109.2 kg    Examination:  General exam: Appears calm and comfortable  Respiratory system: Scattered rhonchi.  Respiratory effort normal. Cardiovascular system: S1 & S2 heard, RRR. No JVD, murmurs, rubs, gallops or clicks. No pedal edema. Gastrointestinal system: Abdomen is nondistended, soft and nontender. No organomegaly or masses felt. Normal bowel sounds heard. Central nervous system: Alert and oriented x 2. Extremities: Symmetric 5 x 5 power. Skin: No rashes, lesions or ulcers.    Data Reviewed: I have personally reviewed following labs and imaging studies  CBC: Recent Labs  Lab 03/16/22 1344 03/17/22 0402 03/18/22 0530 03/19/22 0500  WBC 12.0* 15.1* 14.5* 12.0*  NEUTROABS  --  12.1*  --  9.6*  HGB 14.6 12.3* 12.3* 12.0*  HCT 44.6 38.4* 39.0 36.7*  MCV 89.0 89.9 90.5 88.6  PLT 247  239 185 [id number]    Basic Metabolic Panel: Recent Labs  Lab 03/16/22 1344 03/17/22 0402 03/18/22 0530 03/19/22 0500  NA 137 136 137 134*  K 3.9 4.2 4.2 3.3*  CL 104 103 105 99  CO2 15* 25 25 24  $ GLUCOSE 212* 144* 114* 124*  BUN 24*  QUANTITY NOT SUFFICIENT, UNABLE TO PERFORM TEST 27* 27* 21  CREATININE 1.41*  QUANTITY NOT SUFFICIENT, UNABLE TO PERFORM TEST 1.33* 1.37* 1.10  CALCIUM 9.1 8.5* 8.6* 8.6*  MG 1.8 1.8 1.9 1.7  PHOS 4.3 4.0 3.2 2.6    GFR: Estimated Creatinine Clearance: 82 mL/min (by C-G formula based on SCr of 1.1 mg/dL). Liver Function Tests: Recent Labs  Lab 03/16/22 1344 03/17/22 0402  AST 60* 36  ALT 61* 43  ALKPHOS 96 74  BILITOT 0.9 0.9  PROT 7.2 5.8*  ALBUMIN 3.7 3.0*    Recent Labs  Lab 03/16/22 1344  LIPASE 35    No results for input(s): "AMMONIA" in the last 168 hours. Coagulation Profile: No results for input(s): "INR", "PROTIME" in the last 168 hours. Cardiac Enzymes: No results for  input(s): "CKTOTAL", "CKMB", "CKMBINDEX", "TROPONINI" in the last 168 hours. BNP (last 3 results) No results for input(s): "PROBNP" in the last 8760 hours. HbA1C: Recent Labs    03/16/22 2047  HGBA1C 6.5*    CBG: Recent Labs  Lab 03/18/22 1539 03/18/22 1947 03/18/22 2355 03/19/22 0405 03/19/22 0759  GLUCAP 218* 106* 128* 128* 116*    Lipid Profile: Recent Labs    03/17/22 0402  TRIG 41    Thyroid Function Tests: Recent Labs    03/16/22 1344  TSH 1.981  FREET4 1.27*  T3FREE 2.9    Anemia Panel: No results for input(s): "VITAMINB12", "FOLATE", "FERRITIN", "TIBC", "IRON", "RETICCTPCT" in the last 72 hours. Sepsis Labs: Recent Labs  Lab 03/16/22 1344 03/16/22 1702 03/16/22 2047 03/17/22 0402 03/18/22 0530  PROCALCITON <0.10  --   --  4.77 3.43  LATICACIDVEN 4.0* 3.5* 2.2* 1.5  --      Recent Results (from the past 240 hour(s))  Culture, blood (Routine X 2) w Reflex to ID Panel     Status: Abnormal   Collection Time: 03/16/22  1:44 PM   Specimen: BLOOD  Result Value Ref Range Status   Specimen Description   Final    BLOOD RIGHT WRIST Performed at [facility], [address] [place], [place] [address]    Special Requests   Final    BOTTLES DRAWN AEROBIC AND ANAEROBIC Blood Culture adequate volume Performed at [facility], [address] [place], [place] [address]    Culture  Setup Time   Final    GRAM POSITIVE COCCI AEROBIC BOTTLE ONLY CRITICAL RESULT CALLED TO, READ BACK BY AND VERIFIED WITH: CAROLYN COULTER AT 1931 ON 03/17/22 BY SS    Culture (A)  Final    STAPHYLOCOCCUS EPIDERMIDIS THE SIGNIFICANCE OF ISOLATING THIS ORGANISM FROM A SINGLE SET OF BLOOD CULTURES WHEN MULTIPLE SETS ARE DRAWN IS UNCERTAIN. PLEASE NOTIFY THE MICROBIOLOGY DEPARTMENT WITHIN ONE WEEK IF SPECIATION AND SENSITIVITIES ARE REQUIRED. Performed at [facility], [place] [address] [place], [place] [address]    Report Status 03/19/2022 FINAL  Final   Culture, blood (Routine X 2) w Reflex to ID Panel     Status: None (Preliminary result)   Collection Time: 03/16/22  1:44 PM   Specimen: BLOOD  Result Value Ref Range Status   Specimen Description BLOOD RIGHT WRIST  Final   Special Requests   Final    BOTTLES DRAWN AEROBIC AND ANAEROBIC Blood Culture adequate volume   Culture   Final    NO GROWTH 3 DAYS Performed at [facility], [address] [place], [place] [address]    Report Status PENDING  Incomplete  Blood Culture ID Panel (Reflexed)     Status: Abnormal   Collection Time: 03/16/22  1:44 PM  Result Value Ref Range Status   Enterococcus faecalis NOT DETECTED NOT DETECTED Final   Enterococcus Faecium NOT DETECTED NOT DETECTED Final   Listeria monocytogenes NOT DETECTED NOT DETECTED Final   Staphylococcus species DETECTED (A) NOT DETECTED Final    Comment: CRITICAL RESULT CALLED TO, READ BACK BY AND VERIFIED WITH: CAROLYN COULTER AT 1931 ON 03/17/22 BY SS    Staphylococcus aureus (BCID) NOT DETECTED NOT DETECTED Final   Staphylococcus epidermidis DETECTED (A) NOT DETECTED Final    Comment: CRITICAL RESULT CALLED TO, READ BACK BY AND VERIFIED WITH: CAROLYN COULTER AT 1931 ON 03/17/22 BY SS    Staphylococcus lugdunensis NOT DETECTED NOT DETECTED Final   Streptococcus species NOT DETECTED NOT DETECTED Final   Streptococcus agalactiae NOT DETECTED NOT DETECTED Final   Streptococcus pneumoniae NOT DETECTED NOT DETECTED Final   Streptococcus pyogenes NOT DETECTED NOT DETECTED Final   A.calcoaceticus-baumannii NOT DETECTED NOT DETECTED Final   Bacteroides fragilis NOT DETECTED NOT DETECTED Final   Enterobacterales NOT DETECTED NOT DETECTED Final   Enterobacter cloacae complex NOT DETECTED NOT DETECTED Final   Escherichia coli NOT DETECTED NOT DETECTED Final   Klebsiella aerogenes NOT DETECTED NOT DETECTED Final   Klebsiella oxytoca NOT DETECTED NOT DETECTED Final   Klebsiella pneumoniae NOT DETECTED NOT DETECTED Final    Proteus species NOT DETECTED NOT DETECTED Final   Salmonella species NOT DETECTED NOT DETECTED Final   Serratia marcescens NOT DETECTED NOT DETECTED Final   Haemophilus influenzae NOT DETECTED NOT DETECTED Final   Neisseria meningitidis NOT DETECTED NOT DETECTED Final   Pseudomonas aeruginosa NOT DETECTED NOT DETECTED Final   Stenotrophomonas maltophilia NOT DETECTED NOT DETECTED Final   Candida albicans NOT DETECTED NOT DETECTED Final   Candida auris NOT DETECTED NOT DETECTED Final   Candida glabrata NOT DETECTED NOT DETECTED Final   Candida krusei NOT DETECTED NOT DETECTED Final   Candida parapsilosis NOT DETECTED NOT DETECTED Final   Candida tropicalis NOT DETECTED NOT DETECTED Final   Cryptococcus neoformans/gattii NOT DETECTED NOT DETECTED Final   Methicillin resistance mecA/C NOT DETECTED NOT DETECTED Final    Comment: Performed at [facility], [address] [place], [place] [address]  MRSA Next Gen by PCR, Nasal     Status: None   Collection Time: 03/16/22  6:43 PM   Specimen: Nasal Mucosa; Nasal Swab  Result Value Ref Range Status   MRSA by PCR Next Gen NOT DETECTED NOT DETECTED Final    Comment: (NOTE) The GeneXpert MRSA Assay (FDA approved for NASAL specimens only), is one component of a comprehensive MRSA colonization surveillance program. It is not intended to diagnose MRSA infection nor to guide or monitor treatment for MRSA infections. Test performance is not FDA approved in patients less than 2 years old. Performed at [facility], [address] [place], [place] [address]   Culture, Respiratory w Gram Stain     Status: None (Preliminary result)   Collection Time: 03/16/22  7:32 PM   Specimen: SPU; Respiratory  Result Value Ref Range Status   Specimen Description  Final    SPUTUM Performed at [facility], [address] [place], [place] [address]    Special Requests   Final    SPUTUM Performed at [facility],  [place], [place] [address]    Gram Stain   Final    MODERATE WBC PRESENT, PREDOMINANTLY PMN MODERATE GRAM POSITIVE COCCI IN CHAINS FEW GRAM NEGATIVE RODS FEW GRAM POSITIVE COCCI IN CLUSTERS    Culture   Final    CULTURE REINCUBATED FOR BETTER GROWTH Performed at [facility], [place] [address], [place], [place] [address]    Report Status PENDING  Incomplete  Culture, BAL-quantitative w Gram Stain     Status: None (Preliminary result)   Collection Time: 03/20/22 11:05 AM   Specimen: Bronchoalveolar Lavage; Respiratory  Result Value Ref Range Status   Specimen Description   Final    BRONCHIAL ALVEOLAR LAVAGE Performed at [facility], [address], [place], [place] [address]    Special Requests   Final    NONE Performed at [facility], [address] [place], [place] [address]    Gram Stain   Final    NO WBC SEEN MODERATE GRAM POSITIVE COCCI IN CLUSTERS FEW GRAM POSITIVE COCCI IN PAIRS FEW GRAM POSITIVE COCCI IN CHAINS RARE BUDDING YEAST SEEN    Culture   Final    CULTURE REINCUBATED FOR BETTER GROWTH Performed at [facility], [place] [address], [place], [place] [address]    Report Status PENDING  Incomplete     Radiology Studies: EEG adult  Result Date: 03-20-2022 Derek Jack, MD     03/20/22  2:03 PM Routine EEG Report Ned Fioretti is a 66 y.o. male with a history of altered mental status who is undergoing an EEG to evaluate for seizures. Report: This EEG was acquired with electrodes placed according to the International 10-20 electrode system (including Fp1, Fp2, F3, F4, C3, C4, P3, P4, O1, O2, T3, T4, T5, T6, A1, A2, Fz, Cz, Pz). The following electrodes were missing or displaced: none. The best background was continuous at 5-7 Hz. This activity is reactive to stimulation. There was no sleep architecture. There was no focal slowing. There were no interictal epileptiform discharges. There were no electrographic  seizures identified. Photic stimulation and hyperventilation were not performed. Impression and clinical correlation: This EEG was obtained sedated on versed and fentanyl and is abnormal due to mild-to-moderate diffuse slowing indicative of global cerebral dysfunction, medication effect, or both. Epileptiform abnormalities were not seen during this recording. Su Monks, MD [facility] [phone] If 7pm- 7am, please page neurology on call as listed in [place].    Scheduled Meds:  amLODipine  5 mg Oral Daily   Chlorhexidine Gluconate Cloth  6 each Topical Q0600   feeding supplement  237 mL Oral TID BM   heparin  5,000 Units Subcutaneous Q12H   insulin aspart  0-20 Units Subcutaneous Q4H   linagliptin  5 mg Oral Daily   lisinopril  20 mg Oral QHS   multivitamin with minerals  1 tablet Oral Daily   potassium chloride  40 mEq Oral Q4H   sertraline  50 mg Oral Daily   Continuous Infusions:  sodium chloride Stopped (03/16/22 1940)   sodium chloride 10 mL/hr at 03/19/22 0020   ampicillin-sulbactam (UNASYN) IV 3 g (03/19/22 0216)   vancomycin     vancomycin       LOS: 3 days   Darliss Cheney, MD  [facility]  03/19/2022, 10:17 AM   *Please note that this is a verbal dictation therefore any spelling or grammatical errors are due to the "[place] [employer]" system interpretation.  Please page via [place] and do not message via secure chat for urgent patient care matters. Secure chat can be used for non urgent patient care matters.  How to contact the [facility] Attending or Consulting provider [place] or covering provider during after hours [place], for this patient?  Check the care team in [facility] and look for a) attending/consulting [facility] provider listed and b) the [facility] team listed. Page or secure chat [phone]. Log into [email or web address] and use 's universal password to access. If you do not have the password, please contact the hospital operator. Locate the [facility] provider you are  looking for under [facility] and page to a number that you can be directly reached. If you still have difficulty reaching the provider, please page the [facility] (Director on Call) for the Hospitalists listed on amion for assistance.

## 2022-03-19 NOTE — Progress Notes (Signed)
Speech Language Pathology Treatment: Dysphagia  Patient Details Name: Keith Carter MRN: [id number] DOB: [birth year] Today's Date: 03/19/2022 Time: 0910-1000 SLP Time Calculation (min) (ACUTE ONLY): 50 min  Assessment / Plan / Recommendation Clinical Impression  Pt seen for ongoing assessment of swallowing today. Pt awake, verbal to answer basic questions re: self; asked frequently when his Wife was "going to come here". Oriented to self.  Pt on RA; afebrile today; WBC slightly elevated but trending down today.   Pt continues to present w/ grossly functional oropharyngeal phase swallowing w/ No overt pharyngeal phase dysphagia noted, mild+ oral phase deficit c/b lengthier mastication Time w/ increased textured foods. No gross neuromuscular deficits noted; no OM weakness appreciated. Pt consumed po trials w/ No overt, clinical s/s of aspiration during po trials.  OF NOTE: pt required full feeding support and min+ verbal/visual/tactile cues -- this need for support can increase risk for aspiration. He was able to hold the Cup to self-feed/drink liquids(this can help to reduce risk for aspiration).  Pt appears at reduced risk for aspiration when following general aspiration precautions, using a modified diet consistency, and when given CAREFUL feeding support and monitoring d/t the lengthier oral phase Time w/ solid foods.    Pt does have challenging factors that could impact oropharyngeal swallowing to include Baseline Dementia/Cognitive decline (ANY Cognitive decline can impact her overall awareness/timing of swallow and safety during po tasks which increases risk for aspiration, choking), overall weakness, prior CVA w/ aphasia, and Dependency on being fed his meals, Pills. These factors can increase risk for aspiration, dysphagia as well as decreased oral intake overall.    During po trials, pt consumed all consistencies fed to him(pt helped to hold cup for drinking) w/ No overt coughing, decline in  vocal quality, or change in respiratory presentation during/post trials. O2 sats remained 97-98%. Oral phase appeared grossly [facility] w/ timely bolus management and control of bolus propulsion for A-P transfer for swallowing w/ liquids and puree foods -- noted increased/lengthier mastication of soft solids, even when moistened well. Oral clearing achieved w/ all trial consistencies given Time, moistening soft solid foods cut small, and alternating foods/liquids.      Recommend continue a Mech Soft consistency diet w/ MINCED meats, moistened foods and gravies; Thin liquids -- carefully monitor straw use, and pt should help to Hold Cup when drinking. Recommend general aspiration precautions, Careful Feeding Support monitoring pt clearing his mouth each bite/sip, reduce distractions during meals. Alternate foods/liquids to aid oral clearing.  Pills CRUSHED in Puree for safer, easier swallowing as able.  Education given on Pills in Puree and food consistencies/prep to NSG; general aspiration precautions and feeding support to NSG. Education and practice during meal provided to pt on using lingual sweeping and alternating foods and drink to fully clear mouth b/t bites of foods.   MD updated. No further skilled ST services at this time as pt appears at his Baseline. MD to reconsult if any new needs arise. NSG updated, agreed. Recommend Dietician and Palliative Care f/u for support. Recommend f/u at next venue of care for Education w/ Staff members on feeding support strategies and aspiration precautions as needed.        HPI HPI: Pt is a 67 y/o male with Dementia and severe Parkinson's disease, also has a history of TIA/CVA w/ aphasia, uncontrolled hypertension with episodes of hypertensive emergency, history of massive PE a year ago, poorly controlled diabetes with diabetic retinopathy, epidural lipomatosis, multilevel lumbar osteoarthritis with stenosis and scoliosis,  obstructive uropathy with BPH, chronic  pain syndrome, morbid Obesity with a BMI over 40 chronic aphasia and anxiety disorder who presents after an episode of apparent choking on Meat.  Evidently was found unresponsive by staff at [facility] facility who had initiated CPR.  EMS arrived on scene and reports that he had noted difficulty with ventilation. At this juncture, intubation was attempted and a large piece of Steak was removed from the patient's upper airway or trachea. He was then able to be ventilated but remained unresponsive requiring respirated assistance and 100% oxygenation  EMS was able to pull steak out of throat with forceps; unresponsive to EMS whole time per EMS. Upon arrival to ED, pt suctioned by staff as blood in mouth; repositioned; hernia noted at abdomen. Pt opened eyes to movement; pt diaphoretic. Pt was then orally intubated for respiratory support; low temp noted. Pt was extubated ~24 hours later. Remained NPO until speech evaluation.   CT of Chest: Lungs/Pleura: Mild basilar atelectasis. No pulmonary infarction. No  pleural fluid or pneumonia. Lower chest: Lung bases are clear.      SLP Plan  All goals met      Recommendations for follow up therapy are one component of a multi-disciplinary discharge planning process, led by the attending physician.  Recommendations may be updated based on patient status, additional functional criteria and insurance authorization.    Recommendations  Diet recommendations: Dysphagia 3 (mechanical soft);Dysphagia 2 (fine chop);Thin liquid (meats MINCED w gravies) Liquids provided via: Cup;Straw (monitor) Medication Administration: Crushed with puree Supervision: Patient able to self feed;Staff to assist with self feeding;Full supervision/cueing for compensatory strategies Compensations: Minimize environmental distractions;Slow rate;Small sips/bites;Lingual sweep for clearance of pocketing;Multiple dry swallows after each bite/sip;Follow solids with liquid Postural Changes  and/or Swallow Maneuvers: Out of bed for meals;Seated upright 90 degrees;Upright 30-60 min after meal (Reflux precs)                General recommendations:  (support at meals; Palliative Care consult for GOC/Ed) Oral Care Recommendations: Oral care BID;Oral care before and after PO;Staff/trained caregiver to provide oral care Follow Up Recommendations: No SLP follow up Assistance recommended at discharge: Frequent or constant Supervision/Assistance (baseline Dementia) SLP Visit Diagnosis: Dysphagia, oral phase (R13.11) (baseline Dementia; Parkinson's Dis., Obesity) Plan: All goals met             Keith Kenner, MS, CCC-SLP Speech Language Pathologist Rehab Services; [facility] [phone] (ascom) Keith Carter  03/19/2022, 11:18 AM

## 2022-03-19 NOTE — Progress Notes (Signed)
PHARMACY CONSULT NOTE - FOLLOW UP  Pharmacy Consult for Electrolyte Monitoring and Replacement   Recent Labs: Potassium (mmol/L)  Date Value  03/19/2022 3.3 (L)   Magnesium (mg/dL)  Date Value  03/19/2022 1.7   Calcium (mg/dL)  Date Value  03/19/2022 8.6 (L)   Albumin (g/dL)  Date Value  03/17/2022 3.0 (L)  03/16/2020 3.8   Phosphorus (mg/dL)  Date Value  03/19/2022 2.6   Sodium (mmol/L)  Date Value  03/19/2022 134 (L)  03/16/2020 138    Assessment: 67 year old male admitted with altered mental status, aspiration of foreign body, and AKI. PMH includes dementia, Parkinson's, CVA, uncontrolled HTN, T2DM.    on Unasyn. Bronchoscopy to remove foreign body on 2/15.  Diet: NPO DVT PPX: SQH Q12Hr  Goal of Therapy:  Electrolytes within normal limits  Plan:  K 3.3 - KCL 40 meq po q4h x 2 doses ordered by MD Follow up electrolytes tomorrow AM. Mag 1.7 -will order Magnesium sulfate 2 gm IV x 1 F/u electrolytes in am   Noralee Space ,PharmD Clinical Pharmacist 03/19/2022 8:11 AM

## 2022-03-20 DIAGNOSIS — T17908A Unspecified foreign body in respiratory tract, part unspecified causing other injury, initial encounter: Secondary | ICD-10-CM | POA: Diagnosis not present

## 2022-03-20 LAB — BASIC METABOLIC PANEL
Anion gap: 10 (ref 5–15)
BUN: 20 mg/dL (ref 8–23)
CO2: 22 mmol/L (ref 22–32)
Calcium: 8.8 mg/dL — ABNORMAL LOW (ref 8.9–10.3)
Chloride: 104 mmol/L (ref 98–111)
Creatinine, Ser: 1.11 mg/dL (ref 0.61–1.24)
GFR, Estimated: >60 mL/min (ref >60)
Glucose, Bld: 198 mg/dL — ABNORMAL HIGH (ref 70–99)
Potassium: 3.8 mmol/L (ref 3.5–5.1)
Sodium: 136 mmol/L (ref 135–145)

## 2022-03-20 LAB — CULTURE, RESPIRATORY W GRAM STAIN

## 2022-03-20 LAB — CULTURE, BAL-QUANTITATIVE W GRAM STAIN
Culture: 10000 — AB
Gram Stain: NONE SEEN

## 2022-03-20 LAB — MAGNESIUM: Magnesium: 2.1 mg/dL (ref 1.7–2.4)

## 2022-03-20 LAB — GLUCOSE, CAPILLARY
Glucose-Capillary: 171 mg/dL — ABNORMAL HIGH (ref 70–99)
Glucose-Capillary: 198 mg/dL — ABNORMAL HIGH (ref 70–99)
Glucose-Capillary: 211 mg/dL — ABNORMAL HIGH (ref 70–99)
Glucose-Capillary: 220 mg/dL — ABNORMAL HIGH (ref 70–99)

## 2022-03-20 LAB — HEMOGLOBIN AND HEMATOCRIT, BLOOD
HCT: 43.4 % (ref 39.0–52.0)
Hemoglobin: 14.5 g/dL (ref 13.0–17.0)

## 2022-03-20 LAB — PHOSPHORUS: Phosphorus: 2.6 mg/dL (ref 2.5–4.6)

## 2022-03-20 MED ORDER — ACETAMINOPHEN 650 MG RE SUPP: 650.0000 mg | Freq: Four times a day (QID) | RECTAL | Status: DC | PRN

## 2022-03-20 MED ORDER — ONDANSETRON HCL 4 MG/2ML IJ SOLN: 4.0000 mg | Freq: Four times a day (QID) | INTRAMUSCULAR | Status: DC | PRN

## 2022-03-20 MED ORDER — GLYCOPYRROLATE 0.2 MG/ML IJ SOLN: 0.2000 mg | INTRAMUSCULAR | Status: DC | PRN

## 2022-03-20 MED ORDER — LISINOPRIL 20 MG PO TABS: 40.0000 mg | ORAL_TABLET | Freq: Every day | ORAL | Status: DC

## 2022-03-20 MED ORDER — ACETAMINOPHEN 325 MG PO TABS: 650.0000 mg | ORAL_TABLET | Freq: Four times a day (QID) | ORAL | Status: DC | PRN

## 2022-03-20 MED ORDER — INSULIN GLARGINE-YFGN 100 UNIT/ML ~~LOC~~ SOLN
10.0000 [IU] | Freq: Every day | SUBCUTANEOUS | Status: DC
  Administered 2022-03-20: 10 [IU] via SUBCUTANEOUS
  Filled 2022-03-20: qty 0.1

## 2022-03-20 MED ORDER — HYDRALAZINE HCL 20 MG/ML IJ SOLN
20.0000 mg | Freq: Four times a day (QID) | INTRAMUSCULAR | Status: DC | PRN
  Administered 2022-03-20: 20 mg via INTRAVENOUS
  Filled 2022-03-20 (×2): qty 1

## 2022-03-20 MED ORDER — ONDANSETRON 4 MG PO TBDP: 4.0000 mg | ORAL_TABLET | Freq: Four times a day (QID) | ORAL | Status: DC | PRN

## 2022-03-20 MED ORDER — POLYVINYL ALCOHOL 1.4 % OP SOLN: 1.0000 [drp] | Freq: Four times a day (QID) | OPHTHALMIC | Status: DC | PRN

## 2022-03-20 MED ORDER — SODIUM CHLORIDE 0.9 % IV SOLN: INTRAVENOUS | Status: DC

## 2022-03-20 MED ORDER — MORPHINE SULFATE (PF) 2 MG/ML IV SOLN: 2.0000 mg | INTRAVENOUS | Status: DC | PRN

## 2022-03-20 MED ORDER — HALOPERIDOL LACTATE 5 MG/ML IJ SOLN: 2.5000 mg | INTRAMUSCULAR | Status: DC | PRN

## 2022-03-20 MED ORDER — GLYCOPYRROLATE 1 MG PO TABS: 1.0000 mg | ORAL_TABLET | ORAL | Status: DC | PRN

## 2022-03-20 MED ORDER — AMLODIPINE BESYLATE 10 MG PO TABS
10.0000 mg | ORAL_TABLET | Freq: Every day | ORAL | Status: DC
  Administered 2022-03-20: 10 mg via ORAL
  Filled 2022-03-20: qty 1

## 2022-03-20 MED ORDER — PANTOPRAZOLE SODIUM 40 MG IV SOLR
40.0000 mg | Freq: Two times a day (BID) | INTRAVENOUS | Status: DC
  Administered 2022-03-20: 40 mg via INTRAVENOUS
  Filled 2022-03-20: qty 10

## 2022-03-20 NOTE — Progress Notes (Signed)
Pt had a episode of emesis coffee ground notified provider. He states to continue to monitor.

## 2022-03-20 NOTE — Plan of Care (Signed)
Patients blood pressure has remained elevated during my shift despite PRN interventions. At 9pm, blood pressure was 174/108, his PRN orders are to be given for BP's of 160/100 or higher. Patient got lisinopril 85m scheduled and hydralazine 158mIV PRN at 2130. BP remained elevated. Patient got labetalol 101mV PRN at 2300. BP dropped within normal limits after the labetalol, but that only lasted for half an hour and became elevated again. Midnight blood pressure check was 177/110. Patient is asymptomatic, no complaints. Notified NP BreSharion SettlerP increased dose of hydralazine from 36m2m 20mg65m Will continue to monitor BP and correct as needed.

## 2022-03-20 NOTE — Progress Notes (Signed)
As mentioned in my previous note, patient this morning expressed his desire to give up and be under hospice/comfort care.  I was covering 2 campuses today, other than [facility], I was covering [facility] as well.  After finishing my rounds at [facility], while I was going to [facility], I received a page from the nurse that patient had moderate volume of hematemesis.  I was informed that patient was hemodynamically stable.  Blood pressure was only slightly elevated.  I advised the RN to monitor the patient since the patient had wished this morning to be under comfort care and my plan was to talk to the family later on.  10 to 15 minutes later, while I was still on the road driving to [place], I received another page from the charge nurse who insisted that I do something because patient had 1 episode of hematemesis.  She was already aware of my advice and recommendations to the primary nurse.  I had informed her that due to the fact that I was on the road to other campus, I could not place any orders or so, and thus I advised her ordered stat H&H as well as Protonix 40 mg IV twice daily.  She insisted that I or somebody else see this patient.  I requested my colleague Dr. Mariel Aloe was covering for me to see this patient.  I was informed by the charge nurse that this time patient had irregularly irregular rate and rhythm but heart rate was between 60 and 90.  Patient was seen by Dr. Ilona Sorrel as well and patient was found to be hemodynamically stable.  10 to 15 minutes later, when I reached to [employer], I spoke to patient's son Randall Hiss over the phone and informed him about the new development and that patient had wished to be comfort care.  Patient's daughter was also with Randall Hiss.  They wanted to come talk to the patient and then let us know about their decision.  I was informed at around 1:35 PM by the nurse that patient's family has decided to respect patient's wishes and transition to  comfort care.  I then personally spoke to [place] 1 more time and verified this.  We also discussed that our focus will transition to comfort care from aggressive care and thus we will be stopping all the antibiotics and will prescribe medications for the comfort only.  Eric as well as his sister over the phone agreed with that plan.  I spent another 20 to 25 minutes on those phone calls with the nurses as well as family.

## 2022-03-20 NOTE — Progress Notes (Addendum)
PROGRESS NOTE    Keith Carter  [id number] DOB: 1956-01-15 DOA: 03/16/2022 PCP: [name], NP   Brief Narrative:  This is a cognitively impaired 67 year old male with dementia and severe Parkinson's disease, he also has a history of CVA uncontrolled hypertension with episodes of hypertensive emergency, history of massive PE a year ago, poorly controlled diabetes with diabetic retinopathy, epidural lipomatosis, multilevel lumbar osteoarthritis with stenosis and scoliosis, obstructive uropathy with BPH, chronic pain syndrome, morbid obesity with a BMI over 40 chronic dysarthria and anxiety disorder and erectile dysfunction with CODE STATUS DNR who came in via emergency room due to unresponsive mental status.  In the ED patient was evaluated by neurologist for concern of anoxic brain injury.  Reportedly, patient had steak lodged in the airway which was removed by EMS personnel in the field.  Patient had CT head in the ER which shows mild diffuse atrophy and moderate periventricular deep white matter hypodensity due to small chronic vessel ischemia.  Per documentation ER provider had conversation with daughter who wanted patient intubated and he was admitted to ICU under PCCM on mechanical ventilator for further medical management post foreign body aspiration with respiratory failure and unresponsiveness.  Patient had CT angio PE protocol with no venous pulmonary thromboembolism noted however right lower lobe pneumonia suggestive of aspiration.  Patient then underwent bronchoscopy and some beef chunks in the left lung were removed with aspiration of bronchus, mucous plugging of RLL and RML with therapeutic aspiration of tracheobronchial tree.  BAL at RML due to mucopurulent phlegm.  He was then extubated extubation on 03/17/2022 and transferred under [place] on 03/18/2022.   Assessment & Plan:   Principal Problem:   Foreign body aspiration Active Problems:   Dysarthria   Type 2 diabetes  mellitus with hyperlipidemia ([place])   Essential hypertension   Obesity (BMI 30-39.9)   Expressive aphasia   Acute respiratory failure with hypoxia (HCC)   Aspiration pneumonia ([place])  Acute respiratory failure with hypoxia secondary to foreign body aspiration/aspiration pneumonia: Patient was intubated on the field or in the ER and was initially admitted in ICU and subsequently underwent bronchoscopy as mentioned above and was extubated on 03/17/2022.  Patient's respiratory culture is growing gram-positive cocci in chains and clusters as well as gram-negative rods.  Patient is on Unasyn, he has started to have fever, last temperature spike was 100.6 at 8 PM on 03/18/2022.  Culture identification is still pending.  Continue vancomycin and Unasyn.  Acute encephalopathy: Likely in the setting of acute respiratory failure with hypoxia.  Patient is alert and oriented to self.  This is his baseline.  Patient underwent EEG which was negative for any seizure activity.  Neurology signed off.  Type 2 diabetes mellitus: Hemoglobin A1c 6.5.  Patient on Januvia at home which is on hold.  Continue SSI, blood sugar controlled.  Severe Parkinson's dementia: At baseline.  Essential hypertension: Blood pressure slightly elevated at times, continue home medications which includes amlodipine and lisinopril, as well as continue as needed labetalol and hydralazine.  However I will increase the dose of amlodipine from 5 mg to 10 mg and lisinopril from 20 mg to 40 mg.  BPH: On Flomax at home which is on hold due to n.p.o.  CKD stage IIIa: Patient does not appear to have new or acute AKI or renal insufficiency.  His creatinine appears to be at baseline.  History of previous CVA: Continue Plavix.  Chest pain/SVT: On the afternoon of 03/19/2022, around 5:45 PM,  patient went into SVT with rates up around 180 and he was complaining of chest pain.  Rapid response was called.  EKG showed SVT.  1 dose of IV Lopressor 5 mg was  given and he was converted to sinus rhythm.  Troponins were checked which were slightly elevated, not consistent with ACS.  He has no other complaints of chest pain today.  Monitor on telemetry.  GOC: Patient has expressed his desire to end his suffering and he would like to be hospice/comfort care.  He said that multiple times in front of the nurse.  He also said that his family is on board with him.  I have consulted palliative care.  I will also call the family later today.  DVT prophylaxis: heparin injection 5,000 Units Start: 03/16/22 2200 SCDs Start: 03/16/22 1746   Code Status: DNR  Family Communication:  None present at bedside.   Status is: Inpatient Remains inpatient appropriate because: Patient febrile.   Estimated body mass index is 32.65 kg/m as calculated from the following:   Height as of this encounter: 6' (1.829 m).   Weight as of this encounter: 109.2 kg.    Nutritional Assessment: Body mass index is 32.65 kg/m.[place] Kitchen Seen by dietician.  I agree with the assessment and plan as outlined below: Nutrition Status: Nutrition Problem: Inadequate oral intake Etiology: inability to eat (pt sedated and ventilated) Signs/Symptoms: NPO status    . Skin Assessment: I have examined the patient's skin and I agree with the wound assessment as performed by the wound care RN as outlined below:    Consultants:  PCCM and neurology  Procedures:  As above  Antimicrobials:  Anti-infectives (From admission, onward)    Start     Dose/Rate Route Frequency Ordered Stop   03/19/22 2200  vancomycin (VANCOREADY) IVPB 1750 mg/350 mL        1,750 mg 175 mL/hr over 120 Minutes Intravenous Every 24 hours 03/19/22 0826     03/19/22 0915  vancomycin (VANCOREADY) IVPB 2000 mg/400 mL        2,000 mg 200 mL/hr over 120 Minutes Intravenous  Once 03/19/22 0819 03/19/22 1222   03/16/22 2100  Ampicillin-Sulbactam (UNASYN) 3 g in sodium chloride 0.9 % 100 mL IVPB        3 g 200 mL/hr over 30  Minutes Intravenous Every 6 hours 03/16/22 1953           Subjective:  Patient seen and examined.  He says that he feels " horrible" but could not, with any specific symptoms.  He says that he has decided that he would like to end his suffering.  Objective: Vitals:   03/20/22 0239 03/20/22 0410 03/20/22 0438 03/20/22 0737  BP: (!) 149/84 (!) 166/100 (!) 160/93 (!) 177/107  Pulse: 67 75 85 90  Resp:  15 20 18  $ Temp:  98.4 F (36.9 C) 98.5 F (36.9 C) 98.7 F (37.1 C)  TempSrc:  Oral Oral Oral  SpO2:  96% 97% 98%  Weight:      Height:        Intake/Output Summary (Last 24 hours) at 03/20/2022 0945 Last data filed at 03/20/2022 [id number] Gross per 24 hour  Intake 1734.84 ml  Output 2350 ml  Net -615.16 ml    Filed Weights   03/17/22 0333 03/18/22 0500 03/19/22 0438  Weight: 112.4 kg 112.1 kg 109.2 kg    Examination:  General exam: Appears calm and comfortable  Respiratory system: Clear to auscultation. Respiratory effort normal. Cardiovascular  system: S1 & S2 heard, RRR. No JVD, murmurs, rubs, gallops or clicks. No pedal edema. Gastrointestinal system: Abdomen is nondistended, soft and nontender. No organomegaly or masses felt. Normal bowel sounds heard. Central nervous system: Alert and oriented.  Extremities: Symmetric 5 x 5 power. Skin: No rashes, lesions or ulcers.    Data Reviewed: I have personally reviewed following labs and imaging studies  CBC: Recent Labs  Lab 03/16/22 1344 03/17/22 0402 03/18/22 0530 03/19/22 0500  WBC 12.0* 15.1* 14.5* 12.0*  NEUTROABS  --  12.1*  --  9.6*  HGB 14.6 12.3* 12.3* 12.0*  HCT 44.6 38.4* 39.0 36.7*  MCV 89.0 89.9 90.5 88.6  PLT 247 239 185 [id number]    Basic Metabolic Panel: Recent Labs  Lab 03/16/22 1344 03/17/22 0402 03/18/22 0530 03/19/22 0500 03/20/22 0520  NA 137 136 137 134* 136  K 3.9 4.2 4.2 3.3* 3.8  CL 104 103 105 99 104  CO2 15* 25 25 24 22  $ GLUCOSE 212* 144* 114* 124* 198*  BUN 24*  QUANTITY NOT  SUFFICIENT, UNABLE TO PERFORM TEST 27* 27* 21 20  CREATININE 1.41*  QUANTITY NOT SUFFICIENT, UNABLE TO PERFORM TEST 1.33* 1.37* 1.10 1.11  CALCIUM 9.1 8.5* 8.6* 8.6* 8.8*  MG 1.8 1.8 1.9 1.7 2.1  PHOS 4.3 4.0 3.2 2.6 2.6    GFR: Estimated Creatinine Clearance: 81.3 mL/min (by C-G formula based on SCr of 1.11 mg/dL). Liver Function Tests: Recent Labs  Lab 03/16/22 1344 03/17/22 0402  AST 60* 36  ALT 61* 43  ALKPHOS 96 74  BILITOT 0.9 0.9  PROT 7.2 5.8*  ALBUMIN 3.7 3.0*    Recent Labs  Lab 03/16/22 1344  LIPASE 35    No results for input(s): "AMMONIA" in the last 168 hours. Coagulation Profile: No results for input(s): "INR", "PROTIME" in the last 168 hours. Cardiac Enzymes: No results for input(s): "CKTOTAL", "CKMB", "CKMBINDEX", "TROPONINI" in the last 168 hours. BNP (last 3 results) No results for input(s): "PROBNP" in the last 8760 hours. HbA1C: No results for input(s): "HGBA1C" in the last 72 hours.  CBG: Recent Labs  Lab 03/19/22 1747 03/19/22 2121 03/20/22 0023 03/20/22 0502 03/20/22 0734  GLUCAP 171* 184* 171* 198* 220*    Lipid Profile: No results for input(s): "CHOL", "HDL", "LDLCALC", "TRIG", "CHOLHDL", "LDLDIRECT" in the last 72 hours.  Thyroid Function Tests: No results for input(s): "TSH", "T4TOTAL", "FREET4", "T3FREE", "THYROIDAB" in the last 72 hours.  Anemia Panel: No results for input(s): "VITAMINB12", "FOLATE", "FERRITIN", "TIBC", "IRON", "RETICCTPCT" in the last 72 hours. Sepsis Labs: Recent Labs  Lab 03/16/22 1344 03/16/22 1702 03/16/22 2047 03/17/22 0402 03/18/22 0530  PROCALCITON <0.10  --   --  4.77 3.43  LATICACIDVEN 4.0* 3.5* 2.2* 1.5  --      Recent Results (from the past 240 hour(s))  Culture, blood (Routine X 2) w Reflex to ID Panel     Status: Abnormal   Collection Time: 03/16/22  1:44 PM   Specimen: BLOOD  Result Value Ref Range Status   Specimen Description   Final    BLOOD RIGHT WRIST Performed at [facility], [address] [place], [place] [address]    Special Requests   Final    BOTTLES DRAWN AEROBIC AND ANAEROBIC Blood Culture adequate volume Performed at [facility], [address], [place], [place] [address]    Culture  Setup Time   Final    GRAM POSITIVE COCCI AEROBIC BOTTLE ONLY CRITICAL RESULT CALLED TO,  READ BACK BY AND VERIFIED WITH: CAROLYN COULTER AT 1931 ON 03/17/22 BY SS    Culture (A)  Final    STAPHYLOCOCCUS EPIDERMIDIS THE SIGNIFICANCE OF ISOLATING THIS ORGANISM FROM A SINGLE SET OF BLOOD CULTURES WHEN MULTIPLE SETS ARE DRAWN IS UNCERTAIN. PLEASE NOTIFY THE MICROBIOLOGY DEPARTMENT WITHIN ONE WEEK IF SPECIATION AND SENSITIVITIES ARE REQUIRED. Performed at [facility], [place] [address] [place], [place] [address]    Report Status 03/19/2022 FINAL  Final  Culture, blood (Routine X 2) w Reflex to ID Panel     Status: None (Preliminary result)   Collection Time: 03/16/22  1:44 PM   Specimen: BLOOD  Result Value Ref Range Status   Specimen Description BLOOD RIGHT WRIST  Final   Special Requests   Final    BOTTLES DRAWN AEROBIC AND ANAEROBIC Blood Culture adequate volume   Culture   Final    NO GROWTH 4 DAYS Performed at [facility], [address] [place], [place] [address]    Report Status PENDING  Incomplete  Blood Culture ID Panel (Reflexed)     Status: Abnormal   Collection Time: 03/16/22  1:44 PM  Result Value Ref Range Status   Enterococcus faecalis NOT DETECTED NOT DETECTED Final   Enterococcus Faecium NOT DETECTED NOT DETECTED Final   Listeria monocytogenes NOT DETECTED NOT DETECTED Final   Staphylococcus species DETECTED (A) NOT DETECTED Final    Comment: CRITICAL RESULT CALLED TO, READ BACK BY AND VERIFIED WITH: CAROLYN COULTER AT 1931 ON 03/17/22 BY SS    Staphylococcus aureus (BCID) NOT DETECTED NOT DETECTED Final   Staphylococcus epidermidis DETECTED (A) NOT DETECTED Final    Comment: CRITICAL RESULT CALLED TO,  READ BACK BY AND VERIFIED WITH: CAROLYN COULTER AT 1931 ON 03/17/22 BY SS    Staphylococcus lugdunensis NOT DETECTED NOT DETECTED Final   Streptococcus species NOT DETECTED NOT DETECTED Final   Streptococcus agalactiae NOT DETECTED NOT DETECTED Final   Streptococcus pneumoniae NOT DETECTED NOT DETECTED Final   Streptococcus pyogenes NOT DETECTED NOT DETECTED Final   A.calcoaceticus-baumannii NOT DETECTED NOT DETECTED Final   Bacteroides fragilis NOT DETECTED NOT DETECTED Final   Enterobacterales NOT DETECTED NOT DETECTED Final   Enterobacter cloacae complex NOT DETECTED NOT DETECTED Final   Escherichia coli NOT DETECTED NOT DETECTED Final   Klebsiella aerogenes NOT DETECTED NOT DETECTED Final   Klebsiella oxytoca NOT DETECTED NOT DETECTED Final   Klebsiella pneumoniae NOT DETECTED NOT DETECTED Final   Proteus species NOT DETECTED NOT DETECTED Final   Salmonella species NOT DETECTED NOT DETECTED Final   Serratia marcescens NOT DETECTED NOT DETECTED Final   Haemophilus influenzae NOT DETECTED NOT DETECTED Final   Neisseria meningitidis NOT DETECTED NOT DETECTED Final   Pseudomonas aeruginosa NOT DETECTED NOT DETECTED Final   Stenotrophomonas maltophilia NOT DETECTED NOT DETECTED Final   Candida albicans NOT DETECTED NOT DETECTED Final   Candida auris NOT DETECTED NOT DETECTED Final   Candida glabrata NOT DETECTED NOT DETECTED Final   Candida krusei NOT DETECTED NOT DETECTED Final   Candida parapsilosis NOT DETECTED NOT DETECTED Final   Candida tropicalis NOT DETECTED NOT DETECTED Final   Cryptococcus neoformans/gattii NOT DETECTED NOT DETECTED Final   Methicillin resistance mecA/C NOT DETECTED NOT DETECTED Final    Comment: Performed at [facility], [address] [place], [place] [address]  MRSA Next Gen by PCR, Nasal     Status: None   Collection Time: 03/16/22  6:43 PM   Specimen:  Nasal Mucosa; Nasal Swab  Result Value Ref Range Status   MRSA by PCR Next Gen NOT  DETECTED NOT DETECTED Final    Comment: (NOTE) The GeneXpert MRSA Assay (FDA approved for NASAL specimens only), is one component of a comprehensive MRSA colonization surveillance program. It is not intended to diagnose MRSA infection nor to guide or monitor treatment for MRSA infections. Test performance is not FDA approved in patients less than 16 years old. Performed at [facility], [address] [place], [place] [address]   Culture, Respiratory w Gram Stain     Status: None (Preliminary result)   Collection Time: 03/16/22  7:32 PM   Specimen: SPU; Respiratory  Result Value Ref Range Status   Specimen Description   Final    SPUTUM Performed at [facility], [address], [place], [place] [address]    Special Requests   Final    SPUTUM Performed at [facility], [address] [place], [place] [address]    Gram Stain   Final    MODERATE WBC PRESENT, PREDOMINANTLY PMN MODERATE GRAM POSITIVE COCCI IN CHAINS FEW GRAM NEGATIVE RODS FEW GRAM POSITIVE COCCI IN CLUSTERS    Culture   Final    CULTURE REINCUBATED FOR BETTER GROWTH Performed at [facility], [place] [address], [place], [place] [address]    Report Status PENDING  Incomplete  Culture, BAL-quantitative w Gram Stain     Status: None (Preliminary result)   Collection Time: 03/17/22 11:05 AM   Specimen: Bronchoalveolar Lavage; Respiratory  Result Value Ref Range Status   Specimen Description   Final    BRONCHIAL ALVEOLAR LAVAGE Performed at [facility], [address], [place], [place] [address]    Special Requests   Final    NONE Performed at [facility], [address] [place], [place] [address]    Gram Stain   Final    NO WBC SEEN MODERATE GRAM POSITIVE COCCI IN CLUSTERS FEW GRAM POSITIVE COCCI IN PAIRS FEW GRAM POSITIVE COCCI IN CHAINS RARE BUDDING YEAST SEEN    Culture   Final    CULTURE REINCUBATED FOR BETTER GROWTH Performed at [facility], [place] [address] [place], [place] [address]    Report Status PENDING  Incomplete     Radiology Studies: No results found.  Scheduled Meds:  amLODipine  10 mg Oral Daily   Chlorhexidine Gluconate Cloth  6 each Topical Q0600   feeding supplement  237 mL Oral TID BM   heparin  5,000 Units Subcutaneous Q12H   insulin aspart  0-5 Units Subcutaneous QHS   insulin aspart  0-9 Units Subcutaneous TID WC   insulin glargine-yfgn  10 Units Subcutaneous Daily   linagliptin  5 mg Oral Daily   lisinopril  20 mg Oral QHS   multivitamin with minerals  1 tablet Oral Daily   sertraline  50 mg Oral Daily   Continuous Infusions:  sodium chloride Stopped (03/16/22 1940)   sodium chloride 10 mL/hr at 03/19/22 0020   ampicillin-sulbactam (UNASYN) IV 3 g (03/20/22 [id number])   vancomycin Stopped (03/20/22 0817)     LOS: 4 days   Darliss Cheney, MD [facility]  03/20/2022, 9:45 AM   *Please note that this is a verbal dictation therefore any spelling or grammatical errors are due to the "[place] [facility]" system interpretation.  Please page via [place] and do not message via secure chat for urgent patient care matters. Secure chat can  be used for non urgent patient care matters.  How to contact the [facility] Attending or Consulting provider [place] or covering provider during after hours [place], for this patient?  Check the care team in [facility] and look for a) attending/consulting TRH provider listed and b) the [facility] team listed. Page or secure chat [phone]. Log into [email or web address] and use [place]'s universal password to access. If you do not have the password, please contact the hospital operator. Locate the [facility] provider you are looking for under [facility] and page to a number that you can be directly reached. If you still have difficulty reaching the provider, please page the [facility] (Director on Call) for the Hospitalists listed on amion for assistance.

## 2022-03-20 NOTE — Plan of Care (Signed)
Patient is now at his mental status baseline. No further inpatient neurologic workup indicated. Neurology will be available for questions going forward.  Su Monks, MD [facility] [phone]  If 7pm- 7am, please page neurology on call as listed in [place].

## 2022-03-20 NOTE — Progress Notes (Incomplete)
       CROSS COVER NOTE  NAME: Keith Carter MRN: [id number] DOB : October 08, 1955 ATTENDING PHYSICIAN: Darliss Cheney, MD    Date of Service   03/20/2022   HPI/Events of Note     Interventions   Assessment/Plan: Hold meds tonight X X    *** professional thanks      To reach the provider On-Call:   7AM- 7PM see care teams to locate the attending and reach out to them via [email or web address] Password: [email or web address] 7PM-7AM contact night-coverage If you still have difficulty reaching the appropriate provider, please page the [facility] (Director on Call) for [facility] on amion for assistance  This document was prepared using Systems analyst and may include unintentional dictation errors.  Neomia Glass DNP, MBA, FNP-BC, PMHNP-BC Nurse Practitioner [facility] Pager [phone]

## 2022-03-20 NOTE — Progress Notes (Signed)
Pt called to nursing station and stating the at he is "done". When questioned what he means , he endorses that he's tired and prefers not to have extra measure to keep him alive. Patient is a DNR. Nurse asked if he was having SI, or planned to harm himself. He denies. Notified provider of this.

## 2022-03-21 DIAGNOSIS — Z515 Encounter for palliative care: Secondary | ICD-10-CM

## 2022-03-21 DIAGNOSIS — Z7189 Other specified counseling: Secondary | ICD-10-CM

## 2022-03-21 DIAGNOSIS — T17908A Unspecified foreign body in respiratory tract, part unspecified causing other injury, initial encounter: Secondary | ICD-10-CM | POA: Diagnosis not present

## 2022-03-21 LAB — CULTURE, BLOOD (ROUTINE X 2)
Culture: NO GROWTH
Special Requests: ADEQUATE

## 2022-03-21 NOTE — TOC Progression Note (Signed)
Transition of Care [facility]) - Progression Note    Patient Details  Name: Keith Carter MRN: [id number] Date of Birth: 07-25-1955  [facility] of [facility]) CM/SW Contact  Beverly Sessions, RN Phone Number: 03/21/2022, 3:23 PM  Clinical Narrative:    Per palliative family would like to pursue hospice services at [facility] Confirmed with daughter [name].  She states they do not have a preference of home health agency.  Referral made to [facility] with [facility] Updated Lavella Lemons at [facility]    Expected Discharge Plan: [place] Barriers to Discharge: Continued Medical Work up  Expected Discharge Plan and Services   Discharge Planning Services: CM Consult   Living arrangements for the past 2 months: [place]                 DME Arranged: N/A DME Agency: NA       HH Arranged: NA           Social Determinants of Health (SDOH) Interventions SDOH Screenings   Food Insecurity: No Food Insecurity (03/19/2022)  Housing: Low Risk  (03/19/2022)  Transportation Needs: No Transportation Needs (03/19/2022)  Utilities: Not At Risk (03/19/2022)  Depression (PHQ2-9): Low Risk  (05/26/2021)  Tobacco Use: Low Risk  (03/19/2022)    Readmission Risk Interventions     No data to display

## 2022-03-21 NOTE — Progress Notes (Signed)
[facility])  Hospital Liaison RN note  Received request from [facility] for hospice services at [facility] after discharge. Chart and patient information under review by Hospice physician.   Spoke with daughter [name] to initiate education related to hospice philosophy, services, and team approach to care. Patient/family verbalized understanding of information given. Per discussion, the plan is for discharge to facility likely tomorrow or Wednesday via EMS.   Please send signed and completed DNR with patient/family. Please provide prescriptions at discharge as needed for ongoing symptom management.   Please call with any hospice related questions or concerns.  Thank you for the opportunity to participate in this patient's care.  Jhonnie Garner, Therapist, sports, BSN [employer] [phone]

## 2022-03-21 NOTE — Care Management Important Message (Signed)
Important Message  Patient Details  Name: Kodey Soares MRN: [id number] Date of Birth: 02/09/1955   Medicare Important Message Given:  Other (see comment)  On comfort care measures.  Medicare IM withheld at this time out of respect for patient and family.   Dannette Barbara 03/21/2022, 8:38 AM

## 2022-03-21 NOTE — Progress Notes (Signed)
PROGRESS NOTE    Keith Carter  [id number] DOB: 1955/12/10 DOA: 03/16/2022 PCP: [name], NP   Brief Narrative:  This is a cognitively impaired 67 year old male with dementia and severe Parkinson's disease, he also has a history of CVA uncontrolled hypertension with episodes of hypertensive emergency, history of massive PE a year ago, poorly controlled diabetes with diabetic retinopathy, epidural lipomatosis, multilevel lumbar osteoarthritis with stenosis and scoliosis, obstructive uropathy with BPH, chronic pain syndrome, morbid obesity with a BMI over 40 chronic dysarthria and anxiety disorder and erectile dysfunction with CODE STATUS DNR who came in via emergency room due to unresponsive mental status.  In the ED patient was evaluated by neurologist for concern of anoxic brain injury.  Reportedly, patient had steak lodged in the airway which was removed by EMS personnel in the field.  Patient had CT head in the ER which shows mild diffuse atrophy and moderate periventricular deep white matter hypodensity due to small chronic vessel ischemia.  Per documentation ER provider had conversation with daughter who wanted patient intubated and he was admitted to ICU under PCCM on mechanical ventilator for further medical management post foreign body aspiration with respiratory failure and unresponsiveness.  Patient had CT angio PE protocol with no venous pulmonary thromboembolism noted however right lower lobe pneumonia suggestive of aspiration.  Patient then underwent bronchoscopy and some beef chunks in the left lung were removed with aspiration of bronchus, mucous plugging of RLL and RML with therapeutic aspiration of tracheobronchial tree.  BAL at RML due to mucopurulent phlegm.  He was then extubated extubation on 03/17/2022 and transferred under [place] on 03/18/2022.   Assessment & Plan:   Principal Problem:   Foreign body aspiration Active Problems:   Dysarthria   Type 2 diabetes  mellitus with hyperlipidemia ([place])   Essential hypertension   Obesity (BMI 30-39.9)   Expressive aphasia   Acute respiratory failure with hypoxia (HCC)   Aspiration pneumonia ([place])  Acute respiratory failure with hypoxia secondary to foreign body aspiration/aspiration pneumonia: Patient was intubated on the field or in the ER and was initially admitted in ICU and subsequently underwent bronchoscopy as mentioned above and was extubated on 03/17/2022.  Patient's respiratory culture is growing gram-positive cocci in chains and clusters as well as gram-negative rods.  Patient is on Unasyn, he has started to have fever, last temperature spike was 100.6 at 8 PM on 03/18/2022.  Patient was eventually transitioned to comfort care on the afternoon of 03/20/2022 and all antibiotics were discontinued after mutually decided with the family.  Acute encephalopathy: Likely in the setting of acute respiratory failure with hypoxia.  Patient is alert and oriented to self.  This is his baseline.  Patient underwent EEG which was negative for any seizure activity.  Neurology signed off.  Type 2 diabetes mellitus: Hemoglobin A1c 6.5.  Patient on comfort care now.  Severe Parkinson's dementia: At baseline.  Essential hypertension: Patient on comfort care.  BPH: On Flomax at home which is on hold due to n.p.o.  CKD stage IIIa: Patient does not appear to have new or acute AKI or renal insufficiency.  His creatinine appears to be at baseline.  History of previous CVA: Continue Plavix.  Patient on comfort care.  Chest pain/SVT: On the afternoon of 03/19/2022, around 5:45 PM, patient went into SVT with rates up around 180 and he was complaining of chest pain.  Rapid response was called.  EKG showed SVT.  1 dose of IV Lopressor 5 mg was  given and he was converted to sinus rhythm.  Troponins were checked which were slightly elevated, not consistent with ACS.  He has no other complaints of chest again.  Patient transition to  comfort care on the afternoon of 03/20/2022.  GOC: On the morning of 03/20/2022, patient has expressed his desire to end his suffering and he would like to be hospice/comfort care.  He said that multiple times in front of the nurse.  He also said that his family was on board.  I spoke to patient's son Randall Hiss over the phone as well as patient's daughter, they came and discussed this with the patient and agreed with the patient wishes and patient was transitioned to full comfort care on the afternoon of 03/20/2022.  Palliative care is consulted and we are waiting for them to talk to the patient to decide further placement.  DVT prophylaxis:    Code Status: DNR  Family Communication:  None present at bedside.   Status is: Inpatient Remains inpatient appropriate because: Pending placement to either hospice facility or SNF.   Estimated body mass index is 32.65 kg/m as calculated from the following:   Height as of this encounter: 6' (1.829 m).   Weight as of this encounter: 109.2 kg.    Nutritional Assessment: Body mass index is 32.65 kg/m.Marland Kitchen Seen by dietician.  I agree with the assessment and plan as outlined below: Nutrition Status: Nutrition Problem: Inadequate oral intake Etiology: inability to eat (pt sedated and ventilated) Signs/Symptoms: NPO status    . Skin Assessment: I have examined the patient's skin and I agree with the wound assessment as performed by the wound care RN as outlined below:    Consultants:  PCCM and neurology  Procedures:  As above  Antimicrobials:  Anti-infectives (From admission, onward)    Start     Dose/Rate Route Frequency Ordered Stop   03/19/22 2200  vancomycin (VANCOREADY) IVPB 1750 mg/350 mL  Status:  Discontinued        1,750 mg 175 mL/hr over 120 Minutes Intravenous Every 24 hours 03/19/22 0826 03/20/22 1344   03/19/22 0915  vancomycin (VANCOREADY) IVPB 2000 mg/400 mL        2,000 mg 200 mL/hr over 120 Minutes Intravenous  Once 03/19/22 0819  03/19/22 1222   03/16/22 2100  Ampicillin-Sulbactam (UNASYN) 3 g in sodium chloride 0.9 % 100 mL IVPB  Status:  Discontinued        3 g 200 mL/hr over 30 Minutes Intravenous Every 6 hours 03/16/22 1953 03/20/22 1344         Subjective:  Seen and examined.  He has no complaints.  He is comfortable and he is fully alert and oriented.  Objective: Vitals:   03/20/22 1207 03/20/22 1550 03/20/22 1938 03/21/22 0815  BP: (!) 150/83 (!) 144/81 (!) 176/115 (!) 166/95  Pulse: 73 80 78 72  Resp: 18  20 (!) 22  Temp: 98.8 F (37.1 C) 98.6 F (37 C) 97.8 F (36.6 C) 98.1 F (36.7 C)  TempSrc: Oral Oral Oral   SpO2: 98% 96% 97% 96%  Weight:      Height:        Intake/Output Summary (Last 24 hours) at 03/21/2022 0939 Last data filed at 03/21/2022 [id number] Gross per 24 hour  Intake 75.67 ml  Output 1030 ml  Net -954.33 ml    Filed Weights   03/17/22 0333 03/18/22 0500 03/19/22 0438  Weight: 112.4 kg 112.1 kg 109.2 kg    Examination:  General  exam: Appears calm and comfortable  Respiratory system: Clear to auscultation. Respiratory effort normal. Cardiovascular system: S1 & S2 heard, RRR. No JVD, murmurs, rubs, gallops or clicks. No pedal edema. Gastrointestinal system: Abdomen is nondistended, soft and nontender. No organomegaly or masses felt. Normal bowel sounds heard. Central nervous system: Alert and oriented. No focal neurological deficits.    Data Reviewed: I have personally reviewed following labs and imaging studies  CBC: Recent Labs  Lab 03/16/22 1344 03/17/22 0402 03/18/22 0530 03/19/22 0500 03/20/22 1144  WBC 12.0* 15.1* 14.5* 12.0*  --   NEUTROABS  --  12.1*  --  9.6*  --   HGB 14.6 12.3* 12.3* 12.0* 14.5  HCT 44.6 38.4* 39.0 36.7* 43.4  MCV 89.0 89.9 90.5 88.6  --   PLT 247 239 185 190  --     Basic Metabolic Panel: Recent Labs  Lab 03/16/22 1344 03/17/22 0402 03/18/22 0530 03/19/22 0500 03/20/22 0520  NA 137 136 137 134* 136  K 3.9 4.2 4.2 3.3*  3.8  CL 104 103 105 99 104  CO2 15* 25 25 24 22  $ GLUCOSE 212* 144* 114* 124* 198*  BUN 24*  QUANTITY NOT SUFFICIENT, UNABLE TO PERFORM TEST 27* 27* 21 20  CREATININE 1.41*  QUANTITY NOT SUFFICIENT, UNABLE TO PERFORM TEST 1.33* 1.37* 1.10 1.11  CALCIUM 9.1 8.5* 8.6* 8.6* 8.8*  MG 1.8 1.8 1.9 1.7 2.1  PHOS 4.3 4.0 3.2 2.6 2.6    GFR: Estimated Creatinine Clearance: 81.3 mL/min (by C-G formula based on SCr of 1.11 mg/dL). Liver Function Tests: Recent Labs  Lab 03/16/22 1344 03/17/22 0402  AST 60* 36  ALT 61* 43  ALKPHOS 96 74  BILITOT 0.9 0.9  PROT 7.2 5.8*  ALBUMIN 3.7 3.0*    Recent Labs  Lab 03/16/22 1344  LIPASE 35    No results for input(s): "AMMONIA" in the last 168 hours. Coagulation Profile: No results for input(s): "INR", "PROTIME" in the last 168 hours. Cardiac Enzymes: No results for input(s): "CKTOTAL", "CKMB", "CKMBINDEX", "TROPONINI" in the last 168 hours. BNP (last 3 results) No results for input(s): "PROBNP" in the last 8760 hours. HbA1C: No results for input(s): "HGBA1C" in the last 72 hours.  CBG: Recent Labs  Lab 03/19/22 2121 03/20/22 0023 03/20/22 0502 03/20/22 0734 03/20/22 1029  GLUCAP 184* 171* 198* 220* 211*    Lipid Profile: No results for input(s): "CHOL", "HDL", "LDLCALC", "TRIG", "CHOLHDL", "LDLDIRECT" in the last 72 hours.  Thyroid Function Tests: No results for input(s): "TSH", "T4TOTAL", "FREET4", "T3FREE", "THYROIDAB" in the last 72 hours.  Anemia Panel: No results for input(s): "VITAMINB12", "FOLATE", "FERRITIN", "TIBC", "IRON", "RETICCTPCT" in the last 72 hours. Sepsis Labs: Recent Labs  Lab 03/16/22 1344 03/16/22 1702 03/16/22 2047 03/17/22 0402 03/18/22 0530  PROCALCITON <0.10  --   --  4.77 3.43  LATICACIDVEN 4.0* 3.5* 2.2* 1.5  --      Recent Results (from the past 240 hour(s))  Culture, blood (Routine X 2) w Reflex to ID Panel     Status: Abnormal   Collection Time: 03/16/22  1:44 PM   Specimen: BLOOD   Result Value Ref Range Status   Specimen Description   Final    BLOOD RIGHT WRIST Performed at [facility], [address], [place], [place] [address]    Special Requests   Final    BOTTLES DRAWN AEROBIC AND ANAEROBIC Blood Culture adequate volume Performed at [facility], [address], [place], [place] [address]  Culture  Setup Time   Final    GRAM POSITIVE COCCI AEROBIC BOTTLE ONLY CRITICAL RESULT CALLED TO, READ BACK BY AND VERIFIED WITH: CAROLYN COULTER AT 1931 ON 03/17/22 BY SS    Culture (A)  Final    STAPHYLOCOCCUS EPIDERMIDIS THE SIGNIFICANCE OF ISOLATING THIS ORGANISM FROM A SINGLE SET OF BLOOD CULTURES WHEN MULTIPLE SETS ARE DRAWN IS UNCERTAIN. PLEASE NOTIFY THE MICROBIOLOGY DEPARTMENT WITHIN ONE WEEK IF SPECIATION AND SENSITIVITIES ARE REQUIRED. Performed at [facility], [place] [address], [place], [place] [address]    Report Status 03/19/2022 FINAL  Final  Culture, blood (Routine X 2) w Reflex to ID Panel     Status: None   Collection Time: 03/16/22  1:44 PM   Specimen: BLOOD  Result Value Ref Range Status   Specimen Description BLOOD RIGHT WRIST  Final   Special Requests   Final    BOTTLES DRAWN AEROBIC AND ANAEROBIC Blood Culture adequate volume   Culture   Final    NO GROWTH 5 DAYS Performed at [facility], [address] [place], [place] [address]    Report Status 03/21/2022 FINAL  Final  Blood Culture ID Panel (Reflexed)     Status: Abnormal   Collection Time: 03/16/22  1:44 PM  Result Value Ref Range Status   Enterococcus faecalis NOT DETECTED NOT DETECTED Final   Enterococcus Faecium NOT DETECTED NOT DETECTED Final   Listeria monocytogenes NOT DETECTED NOT DETECTED Final   Staphylococcus species DETECTED (A) NOT DETECTED Final    Comment: CRITICAL RESULT CALLED TO, READ BACK BY AND VERIFIED WITH: CAROLYN COULTER AT 1931 ON 03/17/22 BY SS    Staphylococcus aureus (BCID) NOT DETECTED NOT DETECTED Final    Staphylococcus epidermidis DETECTED (A) NOT DETECTED Final    Comment: CRITICAL RESULT CALLED TO, READ BACK BY AND VERIFIED WITH: CAROLYN COULTER AT 1931 ON 03/17/22 BY SS    Staphylococcus lugdunensis NOT DETECTED NOT DETECTED Final   Streptococcus species NOT DETECTED NOT DETECTED Final   Streptococcus agalactiae NOT DETECTED NOT DETECTED Final   Streptococcus pneumoniae NOT DETECTED NOT DETECTED Final   Streptococcus pyogenes NOT DETECTED NOT DETECTED Final   A.calcoaceticus-baumannii NOT DETECTED NOT DETECTED Final   Bacteroides fragilis NOT DETECTED NOT DETECTED Final   Enterobacterales NOT DETECTED NOT DETECTED Final   Enterobacter cloacae complex NOT DETECTED NOT DETECTED Final   Escherichia coli NOT DETECTED NOT DETECTED Final   Klebsiella aerogenes NOT DETECTED NOT DETECTED Final   Klebsiella oxytoca NOT DETECTED NOT DETECTED Final   Klebsiella pneumoniae NOT DETECTED NOT DETECTED Final   Proteus species NOT DETECTED NOT DETECTED Final   Salmonella species NOT DETECTED NOT DETECTED Final   Serratia marcescens NOT DETECTED NOT DETECTED Final   Haemophilus influenzae NOT DETECTED NOT DETECTED Final   Neisseria meningitidis NOT DETECTED NOT DETECTED Final   Pseudomonas aeruginosa NOT DETECTED NOT DETECTED Final   Stenotrophomonas maltophilia NOT DETECTED NOT DETECTED Final   Candida albicans NOT DETECTED NOT DETECTED Final   Candida auris NOT DETECTED NOT DETECTED Final   Candida glabrata NOT DETECTED NOT DETECTED Final   Candida krusei NOT DETECTED NOT DETECTED Final   Candida parapsilosis NOT DETECTED NOT DETECTED Final   Candida tropicalis NOT DETECTED NOT DETECTED Final   Cryptococcus neoformans/gattii NOT DETECTED NOT DETECTED Final   Methicillin resistance mecA/C NOT DETECTED NOT DETECTED Final    Comment: Performed at [facility], [address] [place], [place] [address]  MRSA Next Gen by  PCR, Nasal     Status: None   Collection Time: 03/16/22  6:43 PM    Specimen: Nasal Mucosa; Nasal Swab  Result Value Ref Range Status   MRSA by PCR Next Gen NOT DETECTED NOT DETECTED Final    Comment: (NOTE) The GeneXpert MRSA Assay (FDA approved for NASAL specimens only), is one component of a comprehensive MRSA colonization surveillance program. It is not intended to diagnose MRSA infection nor to guide or monitor treatment for MRSA infections. Test performance is not FDA approved in patients less than 41 years old. Performed at [facility], [address] [place], [place] [address]   Culture, Respiratory w Gram Stain     Status: None   Collection Time: 03/16/22  7:32 PM   Specimen: SPU; Respiratory  Result Value Ref Range Status   Specimen Description   Final    SPUTUM Performed at [facility], [address], [place], [place] [address]    Special Requests   Final    SPUTUM Performed at [facility], [address] [place], [place] [address]    Gram Stain   Final    MODERATE WBC PRESENT, PREDOMINANTLY PMN MODERATE GRAM POSITIVE COCCI IN CHAINS FEW GRAM NEGATIVE RODS FEW GRAM POSITIVE COCCI IN CLUSTERS    Culture   Final    FEW CANDIDA ALBICANS NO STAPHYLOCOCCUS AUREUS ISOLATED No Pseudomonas species isolated Performed at [facility], [address], [place], [place] [address]    Report Status 03/20/2022 FINAL  Final  Culture, BAL-quantitative w Gram Stain     Status: Abnormal   Collection Time: 03/17/22 11:05 AM   Specimen: Bronchoalveolar Lavage; Respiratory  Result Value Ref Range Status   Specimen Description   Final    BRONCHIAL ALVEOLAR LAVAGE Performed at [facility], [address] [place], [place] [address]    Special Requests   Final    NONE Performed at [facility], [address] [place], [place] [address]    Gram Stain   Final    NO WBC SEEN MODERATE GRAM POSITIVE COCCI IN CLUSTERS FEW GRAM POSITIVE COCCI IN PAIRS FEW GRAM POSITIVE COCCI IN  CHAINS RARE BUDDING YEAST SEEN    Culture (A)  Final    10,000 COLONIES/mL CANDIDA ALBICANS NO STAPHYLOCOCCUS AUREUS ISOLATED No Pseudomonas species isolated Performed at [place] [address], [place], [place] [address]    Report Status 03/20/2022 FINAL  Final     Radiology Studies: No results found.  Scheduled Meds:  Chlorhexidine Gluconate Cloth  6 each Topical Q0600   feeding supplement  237 mL Oral TID BM   Continuous Infusions:     LOS: 5 days   Darliss Cheney, MD [facility]  03/21/2022, 9:39 AM   *Please note that this is a verbal dictation therefore any spelling or grammatical errors are due to the "[place]" system interpretation.  Please page via [place] and do not message via secure chat for urgent patient care matters. Secure chat can be used for non urgent patient care matters.  How to contact the [facility] Attending or Consulting provider [place] or covering provider during after hours [place], for this patient?  Check the care team in [facility] and look for a) attending/consulting TRH provider listed and b) the [facility] team listed. Page or secure chat [phone]. Log into [email or web address] and use [place]'s universal password to access. If you do not have the password, please contact  the hospital operator. Locate the [facility] provider you are looking for under [facility] and page to a number that you can be directly reached. If you still have difficulty reaching the provider, please page the [facility] (Director on Call) for the Hospitalists listed on amion for assistance.

## 2022-03-21 NOTE — Consult Note (Signed)
Consultation Note Date: 03/21/2022   Patient Name: Keith Carter  DOB: 1955-03-03  MRN: [id number]  Age / Sex: 67 y.o., male  PCP: Gauger, Victoriano Lain, NP Referring Physician: Darliss Cheney, MD  Reason for Consultation: Establishing goals of care   HPI/Brief Hospital Course: 67 y.o. male  with past medical history of severe Parkinson's dementia with dementia, CVA, uncontrolled HTN, PE (1 year prior), poorly controlled diabetes with diabetic retinopathy, epidural lipomatosis, osteoarthritis, BPH, chronic dysarthria and anxiety disorder. Admitted on 03/16/2022 after suffering an event at [facility] resulting in him becoming unresponsive. EMS retrieved piece of meat from airway in route.  After family discussions-decision was made for intubation Successfully extubated 2/15  Event 2/18 noted hematemesis Keith Carter voiced his desires to transition to comfort care After discussions with family, transitioned to comfort care by primary team 2/18   Palliative medicine was consulted for assisting with goals of care conversations.  Subjective:  Extensive chart review has been completed prior to meeting patient including labs, vital signs, imaging, progress notes, orders, and available advanced directive documents from current and previous encounters.  Introduced myself as a Designer, jewellery as a member of the palliative care team. Explained palliative medicine is specialized medical care for people living with serious illness. It focuses on providing relief from the symptoms and stress of a serious illness. The goal is to improve quality of life for both the patient and the family.   Nursing notified that daughter with questions regarding comfort care orders and further clarification.  Visited with Keith Carter at his bedside. Awake and alert, responds appropriately to simple conversations, unable to participate in complex conversations due to cognitive  impairment.  Daughter-Bethany, Son-Eric and other family members present during time of visit. Able to explain their understanding of current medical situation. Keith Carter went unresponsive at [facility] performed CPR and alerted EMS. Family thankful CPR was initiated (DNR was in place) as the event was precipitated by a choking event and not a cardiac event. Family voices understanding that Keith Carter wished to transition to comfort care.  Family with questions regarding differences between comfort care and DNR-addressed and answered appropriately. Family unaware comfort care solely focused on providing comfort and not focusing on providing aggressive medical interventions-including ongoing antibiotic therapy at this time-verbalized understanding at end of conversation. Family continues to desire full comfort measures.  Disposition was discussed. Family voices interest in Hospice services. Request to speak with Hospice liaison about qualifications. Unlikely to qualify for GIP. Family interested in returned to [facility] as his wife is also a resident.  Keith Carter denies acute pain or discomfort. No signs of distress noted.  All questions/concerns addressed. Emotional support provided to patient/family/support persons. PMT will continue to follow and support patient as needed.  Objective: Primary Diagnoses: Present on Admission:  Foreign body aspiration  Dysarthria  Obesity (BMI 30-39.9)  Type 2 diabetes mellitus with hyperlipidemia (HCC)  Essential hypertension  Expressive aphasia   Palliative Assessment/Data: 45%   Assessment and Plan  SUMMARY OF RECOMMENDATIONS   DNR Comfort Care TOC to coordinate hospice liaison meeting with family to further discuss options available PMT to continue to follow for ongoing support  Discussed With: Nursing staff, TOC and Dr. Doristine Bosworth   Thank you for this consult and allowing Palliative Medicine to participate in the care of Keith Carter. Palliative  medicine will continue to follow and assist as needed.   Time Total: 55 minutes  Greater than 50%  of this time  was spent counseling and coordinating care related to the above assessment and plan.  Signed by: Theodoro Grist, DNP, AGNP-C Palliative Medicine    Please contact Palliative Medicine Team phone at [phone] for questions and concerns.  For individual provider: See Shea Evans

## 2022-03-21 NOTE — TOC Progression Note (Signed)
Transition of Care [facility]) - Progression Note    Patient Details  Name: Keith Carter MRN: [id number] Date of Birth: 10/26/1955  Transition of [facility] CM/SW Contact  Beverly Sessions, RN Phone Number: 03/21/2022, 11:53 AM  Clinical Narrative:    Currently on comfort care Per MD Palliative care is consulted and we are waiting for them to talk to the patient to decide further placement.    Expected Discharge Plan: [place] Barriers to Discharge: Continued Medical Work up  Expected Discharge Plan and Services   Discharge Planning Services: CM Consult   Living arrangements for the past 2 months: [place]                 DME Arranged: N/A DME Agency: NA       HH Arranged: NA           Social Determinants of Health (SDOH) Interventions SDOH Screenings   Food Insecurity: No Food Insecurity (03/19/2022)  Housing: Low Risk  (03/19/2022)  Transportation Needs: No Transportation Needs (03/19/2022)  Utilities: Not At Risk (03/19/2022)  Depression (PHQ2-9): Low Risk  (05/26/2021)  Tobacco Use: Low Risk  (03/19/2022)    Readmission Risk Interventions     No data to display

## 2022-03-22 ENCOUNTER — Encounter: Payer: Self-pay | Admitting: Pulmonary Disease

## 2022-03-22 DIAGNOSIS — E1169 Type 2 diabetes mellitus with other specified complication: Secondary | ICD-10-CM

## 2022-03-22 DIAGNOSIS — E785 Hyperlipidemia, unspecified: Secondary | ICD-10-CM | POA: Diagnosis not present

## 2022-03-22 DIAGNOSIS — Z515 Encounter for palliative care: Secondary | ICD-10-CM | POA: Diagnosis not present

## 2022-03-22 DIAGNOSIS — J9601 Acute respiratory failure with hypoxia: Secondary | ICD-10-CM | POA: Diagnosis not present

## 2022-03-22 DIAGNOSIS — J690 Pneumonitis due to inhalation of food and vomit: Secondary | ICD-10-CM

## 2022-03-22 MED ORDER — POLYVINYL ALCOHOL 1.4 % OP SOLN: 1.0000 [drp] | Freq: Four times a day (QID) | OPHTHALMIC | 0 refills | Status: AC | PRN

## 2022-03-22 MED ORDER — ONDANSETRON 4 MG PO TBDP: 4.0000 mg | ORAL_TABLET | Freq: Four times a day (QID) | ORAL | 0 refills | Status: AC | PRN

## 2022-03-22 MED ORDER — TRAMADOL HCL 50 MG PO TABS: 50.0000 mg | ORAL_TABLET | Freq: Two times a day (BID) | ORAL | 0 refills | Status: AC

## 2022-03-22 MED ORDER — POLYETHYLENE GLYCOL 3350 17 G PO PACK: 17.0000 g | PACK | Freq: Every day | ORAL | 0 refills | Status: AC | PRN

## 2022-03-22 MED ORDER — TRAZODONE HCL 50 MG PO TABS: 50.0000 mg | ORAL_TABLET | Freq: Every evening | ORAL | Status: AC | PRN

## 2022-03-22 MED ORDER — ENSURE ENLIVE PO LIQD: 237.0000 mL | Freq: Three times a day (TID) | ORAL | 12 refills | Status: AC

## 2022-03-22 MED ORDER — DOCUSATE SODIUM 50 MG/5ML PO LIQD: 100.0000 mg | Freq: Two times a day (BID) | ORAL | 0 refills | Status: AC | PRN

## 2022-03-22 MED ORDER — ALUM & MAG HYDROXIDE-SIMETH 200-200-20 MG/5ML PO SUSP: 30.0000 mL | ORAL | 0 refills | Status: AC | PRN

## 2022-03-22 MED ORDER — LORAZEPAM 0.5 MG PO TABS: 0.5000 mg | ORAL_TABLET | Freq: Three times a day (TID) | ORAL | 0 refills | Status: AC | PRN

## 2022-03-22 MED ORDER — ACETAMINOPHEN 325 MG PO TABS: 650.0000 mg | ORAL_TABLET | Freq: Four times a day (QID) | ORAL | Status: AC | PRN

## 2022-03-22 MED ORDER — ALUM & MAG HYDROXIDE-SIMETH 200-200-20 MG/5ML PO SUSP: 30.0000 mL | ORAL | Status: DC | PRN

## 2022-03-22 MED ORDER — GLYCOPYRROLATE 1 MG PO TABS: 1.0000 mg | ORAL_TABLET | ORAL | Status: AC | PRN

## 2022-03-22 NOTE — Progress Notes (Signed)
Visited with patient at his bedside. Hospice liaison assisting with coordination of care back to facility under hospice care. Planned for discharge today. No needs from palliative per hospice liaison.  No Charge.  Theodoro Grist, DNP, AGNP-C Palliative Medicine  Please call Palliative Medicine team phone with any questions [phone]. For individual providers please see AMION.

## 2022-03-22 NOTE — TOC Transition Note (Signed)
Transition of Care [facility]) - CM/SW Discharge Note   Patient Details  Name: Damante Trewin MRN: [id number] Date of Birth: [birth year]  Transition of Care [facility]) CM/SW Contact:  Laurena Slimmer, RN Phone Number: 03/22/2022, 11:45 AM   Clinical Narrative:    Damaris Schooner with Lavella Lemons in admissions from [facility] to notify of hospital discharge to facility with [facility]  Patient will return to room 11-B Patient 's daughter notified. EMS arranged for 12:30pm Discharge summary sent in [place].  SNF transfer report sent in [place] signing off.   Final next level of care: [place] Barriers to Discharge: Barriers Resolved   Patient Goals and CMS Choice      Discharge Placement                Patient chooses bed at: [facility] Patient to be transferred to facility by: [facility]      Discharge Plan and Services Additional resources added to the After Visit Summary for     Discharge Planning Services: CM Consult            DME Arranged: N/A DME Agency: NA       HH Arranged: NA          Social Determinants of Health (SDOH) Interventions SDOH Screenings   Food Insecurity: No Food Insecurity (03/19/2022)  Housing: Low Risk  (03/19/2022)  Transportation Needs: No Transportation Needs (03/19/2022)  Utilities: Not At Risk (03/19/2022)  Depression (PHQ2-9): Low Risk  (05/26/2021)  Tobacco Use: Low Risk  (03/22/2022)     Readmission Risk Interventions     No data to display

## 2022-03-22 NOTE — NC FL2 (Signed)
[place] LEVEL OF CARE FORM     IDENTIFICATION  Patient Name: Keith Carter Birthdate: Jul 29, 1955 Sex: male Admission Date (Current Location): 03/16/2022  [facility] and [place] Number:      Facility and Address:         Provider Number:    Attending Physician Name and Address:  Ezekiel Slocumb, DO  Relative Name and Phone Number:       Current Level of Care:   Recommended Level of Care:   Prior Approval Number:    Date Approved/Denied:   PASRR Number:    Discharge Plan:      Current Diagnoses: Patient Active Problem List   Diagnosis Date Noted   Hospice care patient 03/22/2022   Aspiration pneumonia ([place]) 03/18/2022   Foreign body aspiration 03/16/2022   Failure to thrive in adult 10/11/2020   Hypertensive emergency 10/08/2020   Acute massive pulmonary embolism ([place]) 10/07/2020   Spondylosis without myelopathy or radiculopathy, lumbosacral region 08/18/2020   DDD (degenerative disc disease), lumbosacral 07/28/2020   Lumbar Grade 1 Anterolisthesis of L1/L2 (2 mm anterior translation w/ flexion) 04/22/2020   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) 04/22/2020   Abnormal MRI, lumbar spine (01/10/2017) 03/16/2020   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   Lumbosacral facet hypertrophy (Multilevel) (Bilateral) 03/16/2020   Lumbar facet joint syndrome (Bilateral) 03/16/2020   Osteoarthritis of facet joint of lumbar spine 03/16/2020   Lumbar lateral recess stenosis (Multilevel) (Bilateral) 03/16/2020   Lumbosacral foraminal stenosis (Multilevel) (Bilateral) 03/16/2020   Lumbar central spinal stenosis, 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) 03/16/2020   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   Dextroscoliosis of lumbar spine 03/16/2020   Lumbar Grade 1 Retrolisthesis of L2/L3 and L3/L4 03/16/2020   Abnormal CT scan, lumbar spine (01/27/2020) 03/16/2020   Elevated hemoglobin A1c 03/16/2020   Chronic anticoagulation (PLAVIX)  03/16/2020   Anxiety 03/15/2020   Arthritis 03/15/2020   Erectile dysfunction 03/15/2020   Hypertension 03/15/2020   Chronic pain syndrome 03/15/2020   Pharmacologic therapy 03/15/2020   Disorder of skeletal system 03/15/2020   Problems influencing health status 03/15/2020   Expressive aphasia 10/08/2019   TIA (transient ischemic attack) 09/29/2019   Aphasia 09/28/2019   History of stroke 05/09/2019   Acute CVA (cerebrovascular accident) ([place]) 04/05/2019   Dysarthria    Type 2 diabetes mellitus with hyperlipidemia (HCC)    Essential hypertension    Obesity (BMI 30-39.9)    Benign prostatic hyperplasia without lower urinary tract symptoms    Stroke ([place]) 04/04/2019   Controlled type 2 diabetes mellitus without complication, without long-term current use of insulin ([place]) 09/06/2017   Pain of both elbows 04/14/2015   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[place]) 09/16/2014   Chronic low back pain (1ry area of Pain) (Bilateral) w/o sciatica 11/27/2013    Orientation RESPIRATION BLADDER Height & Weight            Weight: 109.2 kg Height:  6' (182.9 cm)  BEHAVIORAL SYMPTOMS/MOOD NEUROLOGICAL BOWEL NUTRITION STATUS           AMBULATORY STATUS COMMUNICATION OF NEEDS Skin                               Personal Care Assistance Level of Assistance              Functional Limitations Info             SPECIAL CARE FACTORS  FREQUENCY                       Contractures      Additional Factors Info                  Current Medications (03/22/2022):  This is the current hospital active medication list Current Facility-Administered Medications  Medication Dose Route Frequency Provider Last Rate Last Admin   acetaminophen (TYLENOL) suppository 650 mg  650 mg Rectal Q4H PRN Ottie Glazier, MD       acetaminophen (TYLENOL) tablet 650 mg  650 mg Oral Q6H PRN Darliss Cheney, MD       Or   acetaminophen (TYLENOL) suppository 650 mg  650 mg Rectal  Q6H PRN Darliss Cheney, MD       acetaminophen (TYLENOL) tablet 650 mg  650 mg Oral Q6H PRN Darliss Cheney, MD   650 mg at 03/18/22 1747   alum & mag hydroxide-simeth (MAALOX/MYLANTA) 200-200-20 MG/5ML suspension 30 mL  30 mL Oral Q4H PRN Nicole Kindred A, DO       Chlorhexidine Gluconate Cloth 2 % PADS 6 each  6 each Topical Q0600 Darliss Cheney, MD   6 each at 03/22/22 1124   docusate (COLACE) 50 MG/5ML liquid 100 mg  100 mg Per Tube BID PRN Ottie Glazier, MD       feeding supplement (ENSURE ENLIVE / ENSURE PLUS) liquid 237 mL  237 mL Oral TID BM Darliss Cheney, MD   237 mL at 03/20/22 0820   glycopyrrolate (ROBINUL) tablet 1 mg  1 mg Oral Q4H PRN Darliss Cheney, MD       Or   glycopyrrolate (ROBINUL) injection 0.2 mg  0.2 mg Subcutaneous Q4H PRN Darliss Cheney, MD       Or   glycopyrrolate (ROBINUL) injection 0.2 mg  0.2 mg Intravenous Q4H PRN Pahwani, Einar Grad, MD       haloperidol lactate (HALDOL) injection 2.5-5 mg  2.5-5 mg Intravenous Q4H PRN Pahwani, Einar Grad, MD       morphine (PF) 2 MG/ML injection 2-4 mg  2-4 mg Intravenous Q30 min PRN Darliss Cheney, MD       ondansetron (ZOFRAN-ODT) disintegrating tablet 4 mg  4 mg Oral Q6H PRN Darliss Cheney, MD       Or   ondansetron (ZOFRAN) injection 4 mg  4 mg Intravenous Q6H PRN Pahwani, Ravi, MD       polyethylene glycol (MIRALAX / GLYCOLAX) packet 17 g  17 g Per Tube Daily PRN Ottie Glazier, MD   17 g at 03/19/22 1701   polyvinyl alcohol (LIQUIFILM TEARS) 1.4 % ophthalmic solution 1 drop  1 drop Both Eyes QID PRN Darliss Cheney, MD       traMADol (ULTRAM) tablet 50 mg  50 mg Oral Q12H PRN Darliss Cheney, MD   50 mg at 03/20/22 1249   traZODone (DESYREL) tablet 50 mg  50 mg Oral QHS PRN Sharion Settler, NP   50 mg at 03/18/22 2121     Discharge Medications: Please see discharge summary for a list of discharge medications.  Relevant Imaging Results:  Relevant Lab Results:   Additional Information    Laurena Slimmer, RN

## 2022-03-22 NOTE — NC FL2 (Signed)
[place] LEVEL OF CARE FORM     IDENTIFICATION  Patient Name: Keith Carter Birthdate: [birth year] Sex: male Admission Date (Current Location): 03/16/2022  [facility] and [place] Number:  Engineering geologist and Address:  [facility], [address], [place],  [address]      Provider Number: [id number]  Attending Physician Name and Address:  Ezekiel Slocumb, DO  Relative Name and Phone Number:  [phone]    Current Level of Care: Hospital Recommended Level of Care: [place] (hospice) Prior Approval Number:    Date Approved/Denied:   PASRR Number: [id number]  Discharge Plan: SNF (hospice)    Current Diagnoses: Patient Active Problem List   Diagnosis Date Noted   Hospice care patient 03/22/2022   Aspiration pneumonia ([place]) 03/18/2022   Foreign body aspiration 03/16/2022   Failure to thrive in adult 10/11/2020   Hypertensive emergency 10/08/2020   Acute massive pulmonary embolism ([place]) 10/07/2020   Spondylosis without myelopathy or radiculopathy, lumbosacral region 08/18/2020   DDD (degenerative disc disease), lumbosacral 07/28/2020   Lumbar Grade 1 Anterolisthesis of L1/L2 (2 mm anterior translation w/ flexion) 04/22/2020   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) 04/22/2020   Abnormal MRI, lumbar spine (01/10/2017) 03/16/2020   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   Lumbosacral facet hypertrophy (Multilevel) (Bilateral) 03/16/2020   Lumbar facet joint syndrome (Bilateral) 03/16/2020   Osteoarthritis of facet joint of lumbar spine 03/16/2020   Lumbar lateral recess stenosis (Multilevel) (Bilateral) 03/16/2020   Lumbosacral foraminal stenosis (Multilevel) (Bilateral) 03/16/2020   Lumbar central spinal stenosis, 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) 03/16/2020   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   Dextroscoliosis of lumbar spine 03/16/2020   Lumbar Grade 1  Retrolisthesis of L2/L3 and L3/L4 03/16/2020   Abnormal CT scan, lumbar spine (01/27/2020) 03/16/2020   Elevated hemoglobin A1c 03/16/2020   Chronic anticoagulation (PLAVIX) 03/16/2020   Anxiety 03/15/2020   Arthritis 03/15/2020   Erectile dysfunction 03/15/2020   Hypertension 03/15/2020   Chronic pain syndrome 03/15/2020   Pharmacologic therapy 03/15/2020   Disorder of skeletal system 03/15/2020   Problems influencing health status 03/15/2020   Expressive aphasia 10/08/2019   TIA (transient ischemic attack) 09/29/2019   Aphasia 09/28/2019   History of stroke 05/09/2019   Acute CVA (cerebrovascular accident) ([place]) 04/05/2019   Dysarthria    Type 2 diabetes mellitus with hyperlipidemia (HCC)    Essential hypertension    Obesity (BMI 30-39.9)    Benign prostatic hyperplasia without lower urinary tract symptoms    Stroke ([place]) 04/04/2019   Controlled type 2 diabetes mellitus without complication, without long-term current use of insulin ([place]) 09/06/2017   Pain of both elbows 04/14/2015   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[place]) 09/16/2014   Chronic low back pain (1ry area of Pain) (Bilateral) w/o sciatica 11/27/2013    Orientation RESPIRATION BLADDER Height & Weight     Self, Time  Normal External catheter Weight: 109.2 kg Height:  6' (182.9 cm)  BEHAVIORAL SYMPTOMS/MOOD NEUROLOGICAL BOWEL NUTRITION STATUS  Other (Comment) (n/a)  (n/a) Continent Diet (DYS 3)  AMBULATORY STATUS COMMUNICATION OF NEEDS Skin   Limited Assist   Bruising (bilateral)                       Personal Care Assistance Level of Assistance  Bathing, Feeding, Dressing Bathing Assistance: Limited assistance Feeding assistance: Limited assistance Dressing Assistance: Limited assistance Total Care Assistance: Limited assistance  Functional Limitations Info             SPECIAL CARE FACTORS FREQUENCY                       Contractures Contractures Info: Not present     Additional Factors Info  Code Status, Allergies Code Status Info: DNR Allergies Info: No known allergies           Current Medications (03/22/2022):  This is the current hospital active medication list Current Facility-Administered Medications  Medication Dose Route Frequency Provider Last Rate Last Admin   acetaminophen (TYLENOL) suppository 650 mg  650 mg Rectal Q4H PRN Ottie Glazier, MD       acetaminophen (TYLENOL) tablet 650 mg  650 mg Oral Q6H PRN Darliss Cheney, MD       Or   acetaminophen (TYLENOL) suppository 650 mg  650 mg Rectal Q6H PRN Darliss Cheney, MD       acetaminophen (TYLENOL) tablet 650 mg  650 mg Oral Q6H PRN Darliss Cheney, MD   650 mg at 03/18/22 1747   alum & mag hydroxide-simeth (MAALOX/MYLANTA) 200-200-20 MG/5ML suspension 30 mL  30 mL Oral Q4H PRN Nicole Kindred A, DO       Chlorhexidine Gluconate Cloth 2 % PADS 6 each  6 each Topical Q0600 Darliss Cheney, MD   6 each at 03/22/22 1124   docusate (COLACE) 50 MG/5ML liquid 100 mg  100 mg Per Tube BID PRN Ottie Glazier, MD       feeding supplement (ENSURE ENLIVE / ENSURE PLUS) liquid 237 mL  237 mL Oral TID BM Darliss Cheney, MD   237 mL at 03/20/22 0820   glycopyrrolate (ROBINUL) tablet 1 mg  1 mg Oral Q4H PRN Darliss Cheney, MD       Or   glycopyrrolate (ROBINUL) injection 0.2 mg  0.2 mg Subcutaneous Q4H PRN Darliss Cheney, MD       Or   glycopyrrolate (ROBINUL) injection 0.2 mg  0.2 mg Intravenous Q4H PRN Pahwani, Einar Grad, MD       haloperidol lactate (HALDOL) injection 2.5-5 mg  2.5-5 mg Intravenous Q4H PRN Pahwani, Einar Grad, MD       morphine (PF) 2 MG/ML injection 2-4 mg  2-4 mg Intravenous Q30 min PRN Darliss Cheney, MD       ondansetron (ZOFRAN-ODT) disintegrating tablet 4 mg  4 mg Oral Q6H PRN Darliss Cheney, MD       Or   ondansetron (ZOFRAN) injection 4 mg  4 mg Intravenous Q6H PRN Pahwani, Ravi, MD       polyethylene glycol (MIRALAX / GLYCOLAX) packet 17 g  17 g Per Tube Daily PRN Ottie Glazier, MD   17 g  at 03/19/22 1701   polyvinyl alcohol (LIQUIFILM TEARS) 1.4 % ophthalmic solution 1 drop  1 drop Both Eyes QID PRN Darliss Cheney, MD       traMADol (ULTRAM) tablet 50 mg  50 mg Oral Q12H PRN Darliss Cheney, MD   50 mg at 03/20/22 1249   traZODone (DESYREL) tablet 50 mg  50 mg Oral QHS PRN Sharion Settler, NP   50 mg at 03/18/22 2121     Discharge Medications: Please see discharge summary for a list of discharge medications.  Relevant Imaging Results:  Relevant Lab Results:   Additional Information SS# [id number]  Laurena Slimmer, RN

## 2022-03-22 NOTE — Discharge Summary (Addendum)
Physician Discharge Summary   Patient: Keith Carter MRN: [id number] DOB: 18-May-1955  Admit date:     03/16/2022  Discharge date: 03/22/22  Discharge Physician: Ezekiel Slocumb   PCP: [name], NP   Recommendations at discharge:    Follow up with Hospice team at facility  Discharge Diagnoses: Principal Problem:   Foreign body aspiration Active Problems:   Dysarthria   Type 2 diabetes mellitus with hyperlipidemia ([place])   Essential hypertension   Obesity (BMI 30-39.9)   Expressive aphasia   Aspiration pneumonia [facility])   Hospice care patient  Resolved Problems:   Acute respiratory failure with hypoxia [facility])  Hospital Course:  This is a cognitively impaired 67 year old male with dementia and severe Parkinson's disease, he also has a history of CVA uncontrolled hypertension with episodes of hypertensive emergency, history of massive PE a year ago, poorly controlled diabetes with diabetic retinopathy, epidural lipomatosis, multilevel lumbar osteoarthritis with stenosis and scoliosis, obstructive uropathy with BPH, chronic pain syndrome, morbid obesity with a BMI over 40 chronic dysarthria and anxiety disorder and erectile dysfunction with CODE STATUS DNR who came in via emergency room due to unresponsive mental status.  In the ED patient was evaluated by neurologist for concern of anoxic brain injury.  Reportedly, patient had steak lodged in the airway which was removed by EMS personnel in the field.  Patient had CT head in the ER which shows mild diffuse atrophy and moderate periventricular deep white matter hypodensity due to small chronic vessel ischemia.  Per documentation ER provider had conversation with daughter who wanted patient intubated and he was admitted to ICU under PCCM on mechanical ventilator for further medical management post foreign body aspiration with respiratory failure and unresponsiveness.  Patient had CT angio PE protocol with no venous pulmonary  thromboembolism noted however right lower lobe pneumonia suggestive of aspiration.  Patient then underwent bronchoscopy and some beef chunks in the left lung were removed with aspiration of bronchus, mucous plugging of RLL and RML with therapeutic aspiration of tracheobronchial tree.  BAL at RML due to mucopurulent phlegm.  He was then extubated extubation on 03/17/2022 and transferred under [place] on 03/18/2022.   Assessment and Plan:  Acute respiratory failure with hypoxia secondary to foreign body aspiration/aspiration pneumonia: Patient was intubated on the field or in the ER and was initially admitted in ICU and subsequently underwent bronchoscopy as mentioned above and was extubated on 03/17/2022.  Patient's respiratory culture is growing gram-positive cocci in chains and clusters as well as gram-negative rods.  Patient is on Unasyn, he has started to have fever, last temperature spike was 100.6 at 8 PM on 03/18/2022.  Patient was eventually transitioned to comfort care on the afternoon of 03/20/2022 and all antibiotics were discontinued after mutually decided with the family.   Acute metabolic encephalopathy: Likely in the setting of acute respiratory failure with hypoxia.  Patient is alert and oriented to self. Mental status now at baseline. Patient underwent EEG which was negative for any seizure activity. Neurology signed off.   Type 2 diabetes mellitus: Hemoglobin A1c 6.5.  Patient on comfort care now.   Severe Parkinson's dementia: At baseline.   Essential hypertension: Patient on comfort care.   BPH: On Flomax at home which is on hold due to n.p.o.   CKD stage IIIa: Patient does not appear to have new or acute AKI or renal insufficiency.  His creatinine appears to be at baseline.   History of previous CVA: Continue Plavix.  Patient  on comfort care.   Chest pain/SVT: On the afternoon of 03/19/2022, around 5:45 PM, patient went into SVT with rates up around 180 and he was complaining of chest  pain.  Rapid response was called.  EKG showed SVT.  1 dose of IV Lopressor 5 mg was given and he was converted to sinus rhythm.  Troponins were checked which were slightly elevated, not consistent with ACS.  He has no other complaints of chest again.  Patient transition to comfort care on the afternoon of 03/20/2022.   Goals of care: On the morning of 03/20/2022, patient has expressed his desire to end his suffering and he would like to be hospice/comfort care.  He said that multiple times in front of the nurse.  He also said that his family was on board.  I spoke to patient's son Randall Hiss over the phone as well as patient's daughter, they came and discussed this with the patient and agreed with the patient wishes and patient was transitioned to full comfort care on the afternoon of 03/20/2022.  Palliative care is consulted.   Plan is to return to facility with hospice following. Hospice team aware of discharge today.       Consultants: Neurology, PCCM, Palliative care Procedures performed: As above  Disposition: Skilled nursing facility with hospice  Diet recommendation:  Discharge Diet Orders (From admission, onward)     Start     Ordered   03/22/22 0000  Diet - low sodium heart healthy        03/22/22 0958           Regular diet DISCHARGE MEDICATION: Allergies as of 03/22/2022   No Known Allergies      Medication List     STOP taking these medications    amLODipine 5 MG tablet Commonly known as: NORVASC   Cholecalciferol 125 MCG (5000 UT) Tabs   clopidogrel 75 MG tablet Commonly known as: PLAVIX   lisinopril 20 MG tablet Commonly known as: ZESTRIL   sertraline 50 MG tablet Commonly known as: ZOLOFT   sitaGLIPtin 50 MG tablet Commonly known as: JANUVIA       TAKE these medications    acetaminophen 325 MG tablet Commonly known as: TYLENOL Take 2 tablets (650 mg total) by mouth every 6 (six) hours as needed for mild pain, fever or headache.   alum & mag  hydroxide-simeth 200-200-20 MG/5ML suspension Commonly known as: MAALOX/MYLANTA Take 30 mLs by mouth every 4 (four) hours as needed for indigestion or heartburn.   docusate 50 MG/5ML liquid Commonly known as: COLACE Place 10 mLs (100 mg total) into feeding tube 2 (two) times daily as needed for mild constipation.   DULoxetine 60 MG capsule Commonly known as: CYMBALTA Take 60 mg by mouth daily.   feeding supplement Liqd Take 237 mLs by mouth 3 (three) times daily between meals.   glycopyrrolate 1 MG tablet Commonly known as: ROBINUL Take 1 tablet (1 mg total) by mouth every 4 (four) hours as needed (excessive secretions).   LORazepam 0.5 MG tablet Commonly known as: Ativan Take 1-2 tablets (0.5-1 mg total) by mouth every 8 (eight) hours as needed for anxiety.   ondansetron 4 MG disintegrating tablet Commonly known as: ZOFRAN-ODT Take 1 tablet (4 mg total) by mouth every 6 (six) hours as needed for nausea.   polyethylene glycol 17 g packet Commonly known as: MIRALAX / GLYCOLAX Place 17 g into feeding tube daily as needed for moderate constipation.   polyvinyl alcohol 1.4 %  ophthalmic solution Commonly known as: LIQUIFILM TEARS Place 1 drop into both eyes 4 (four) times daily as needed for dry eyes.   tamsulosin 0.4 MG Caps capsule Commonly known as: FLOMAX Take 0.8 mg by mouth at bedtime.   traMADol 50 MG tablet Commonly known as: ULTRAM Take 1-2 tablets (50-100 mg total) by mouth 2 (two) times daily. What changed: how much to take   traZODone 50 MG tablet Commonly known as: DESYREL Take 1 tablet (50 mg total) by mouth at bedtime as needed for sleep.        Discharge Exam: Filed Weights   03-18-2022 0333 03/18/22 0500 03/19/22 0438  Weight: 112.4 kg 112.1 kg 109.2 kg   General exam: awake, alert, no acute distress, chronically ill-appearing HEENT: moist mucus membranes, hearing grossly normal  Respiratory system: CTAB, no wheezes, rales or rhonchi, normal  respiratory effort. Cardiovascular system: normal S1/S2, RRR   Gastrointestinal system: soft, NT, ND Central nervous system: A&O x3. dysarthric speech Extremities: moves all, no edema, normal tone Skin: dry, intact, normal temperature Psychiatry: normal mood, congruent affect, judgement and insight appear normal   Condition at discharge: stable  The results of significant diagnostics from this hospitalization (including imaging, microbiology, ancillary and laboratory) are listed below for reference.   Imaging Studies: EEG adult  Result Date: 03-18-2022 Derek Jack, MD     03/18/2022  2:03 PM Routine EEG Report Cobain Krikorian Picardo is a 67 y.o. male with a history of altered mental status who is undergoing an EEG to evaluate for seizures. Report: This EEG was acquired with electrodes placed according to the International 10-20 electrode system (including Fp1, Fp2, F3, F4, C3, C4, P3, P4, O1, O2, T3, T4, T5, T6, A1, A2, Fz, Cz, Pz). The following electrodes were missing or displaced: none. The best background was continuous at 5-7 Hz. This activity is reactive to stimulation. There was no sleep architecture. There was no focal slowing. There were no interictal epileptiform discharges. There were no electrographic seizures identified. Photic stimulation and hyperventilation were not performed. Impression and clinical correlation: This EEG was obtained sedated on versed and fentanyl and is abnormal due to mild-to-moderate diffuse slowing indicative of global cerebral dysfunction, medication effect, or both. Epileptiform abnormalities were not seen during this recording. Su Monks, MD [facility] [phone] If 7pm- 7am, please page neurology on call as listed in [place].   DG Abd 1 View  Result Date: 03/16/2022 CLINICAL DATA:  OG tube placement EXAM: ABDOMEN - 1 VIEW COMPARISON:  CT 03/16/2022 FINDINGS: Enteric tube is present with tip coiled in the left upper quadrant consistent with  location in the upper stomach. Visualized bowel gas pattern is normal with scattered gas present. Postoperative changes in the lumbar spine and right lower quadrant. Residual contrast material in the renal collecting systems. Infiltration or atelectasis suggested in the right lung base. IMPRESSION: Enteric tube tip is present, coiled in the left upper quadrant consistent with location in the upper stomach. Electronically Signed   By: Lucienne Capers M.D.   On: 03/16/2022 19:22   CT Angio Chest PE W and/or Wo Contrast  Result Date: 03/16/2022 CLINICAL DATA:  Concern for pulmonary embolism. Respiratory distress. Food stuck in throat: Respiratory distress. Unresponsive EXAM: CT ANGIOGRAPHY CHEST CT ABDOMEN AND PELVIS WITH CONTRAST TECHNIQUE: Multidetector CT imaging of the chest was performed using the standard protocol during bolus administration of intravenous contrast. Multiplanar CT image reconstructions and MIPs were obtained to evaluate the vascular anatomy. Multidetector CT imaging of  the abdomen and pelvis was performed using the standard protocol during bolus administration of intravenous contrast. RADIATION DOSE REDUCTION: This exam was performed according to the departmental dose-optimization program which includes automated exposure control, adjustment of the mA and/or kV according to patient size and/or use of iterative reconstruction technique. CONTRAST:  161m OMNIPAQUE IOHEXOL 350 MG/ML SOLN COMPARISON:  None Available. FINDINGS: CTA CHEST FINDINGS Cardiovascular: No filling defects within the pulmonary arteries to suggest acute pulmonary embolism. Mediastinum/Nodes: Endotracheal tube within the mid trachea. No endobronchial foreign body. No axillary or supraclavicular adenopathy. No mediastinal or hilar adenopathy. No pericardial fluid. Esophagus normal. Relatively hypodense lesion in the LEFT lobe of the thyroid gland measures 18 mm. Lungs/Pleura: Mild basilar atelectasis. No pulmonary  infarction. No pleural fluid or pneumonia. Musculoskeletal: A single nondisplaced anterior RIGHT sixth rib fracture (image 101/4) Review of the MIP images confirms the above findings. CT ABDOMEN and PELVIS FINDINGS Lower chest: Lung bases are clear. Hepatobiliary: No focal hepatic lesion. Normal gallbladder. No biliary duct dilatation. Common bile duct is normal. Pancreas: Pancreas is normal. No ductal dilatation. No pancreatic inflammation. Spleen: Normal spleen Adrenals/urinary tract: Adrenal glands normal. Nonobstructing LEFT renal calculi. No obstructive uropathy. Foley catheter within collapsed bladder. Stomach/Bowel: Stomach, small bowel, appendix, and cecum are normal. The colon and rectosigmoid colon are normal. LEFT Vascular/Lymphatic: Abdominal aorta is normal caliber. No periportal or retroperitoneal adenopathy. No pelvic adenopathy. Reproductive: Prostate unremarkable Other: There is a well-circumscribed fluid collection in the RIGHT ventral peritoneal space measuring 17.5 cm x 10.2 cm. This collection is not changed from CT exam 10/11/2020. There is a wavy hernia mesh centrally within the collection. There is a midline hernia medial to the contain fluid collection contains loop of nonobstructed small bowel (image 67/4) Musculoskeletal: Posterior lumbar fusion and sacral fusion. No acute findings. Review of the MIP images confirms the above findings. IMPRESSION: CHEST IMPRESSION: 1. No evidence acute pulmonary embolism. 2. No endobronchial foreign body. 3. Mild basilar atelectasis. 4. Single nondisplaced anterior RIGHT sixth rib fracture. 5. Relatively hypodense lesion in the LEFT lobe of the thyroid gland. Recommend non-emergent thyroid ultrasound. Reference: J Am Coll Radiol. 2015 Feb;12(2): 143-50 PELVIS IMPRESSION: 1. No acute findings in the abdomen pelvis. 2. Stable fluid collection in the RIGHT ventral peritoneal space with hernia mesh. 3. Midline ventral hernia contains a loop of nonobstructed  small bowel. 4. Nonobstructing LEFT renal calculi. 5. Foley catheter in collapsed bladder. 6. Posterior lumbar fusion and sacral fusion. Electronically Signed   By: SSuzy BouchardM.D.   On: 03/16/2022 16:17   CT ABDOMEN PELVIS W CONTRAST  Result Date: 03/16/2022 CLINICAL DATA:  Concern for pulmonary embolism. Respiratory distress. Food stuck in throat: Respiratory distress. Unresponsive EXAM: CT ANGIOGRAPHY CHEST CT ABDOMEN AND PELVIS WITH CONTRAST TECHNIQUE: Multidetector CT imaging of the chest was performed using the standard protocol during bolus administration of intravenous contrast. Multiplanar CT image reconstructions and MIPs were obtained to evaluate the vascular anatomy. Multidetector CT imaging of the abdomen and pelvis was performed using the standard protocol during bolus administration of intravenous contrast. RADIATION DOSE REDUCTION: This exam was performed according to the departmental dose-optimization program which includes automated exposure control, adjustment of the mA and/or kV according to patient size and/or use of iterative reconstruction technique. CONTRAST:  1030mOMNIPAQUE IOHEXOL 350 MG/ML SOLN COMPARISON:  None Available. FINDINGS: CTA CHEST FINDINGS Cardiovascular: No filling defects within the pulmonary arteries to suggest acute pulmonary embolism. Mediastinum/Nodes: Endotracheal tube within the mid trachea. No endobronchial foreign body. No  axillary or supraclavicular adenopathy. No mediastinal or hilar adenopathy. No pericardial fluid. Esophagus normal. Relatively hypodense lesion in the LEFT lobe of the thyroid gland measures 18 mm. Lungs/Pleura: Mild basilar atelectasis. No pulmonary infarction. No pleural fluid or pneumonia. Musculoskeletal: A single nondisplaced anterior RIGHT sixth rib fracture (image 101/4) Review of the MIP images confirms the above findings. CT ABDOMEN and PELVIS FINDINGS Lower chest: Lung bases are clear. Hepatobiliary: No focal hepatic lesion.  Normal gallbladder. No biliary duct dilatation. Common bile duct is normal. Pancreas: Pancreas is normal. No ductal dilatation. No pancreatic inflammation. Spleen: Normal spleen Adrenals/urinary tract: Adrenal glands normal. Nonobstructing LEFT renal calculi. No obstructive uropathy. Foley catheter within collapsed bladder. Stomach/Bowel: Stomach, small bowel, appendix, and cecum are normal. The colon and rectosigmoid colon are normal. LEFT Vascular/Lymphatic: Abdominal aorta is normal caliber. No periportal or retroperitoneal adenopathy. No pelvic adenopathy. Reproductive: Prostate unremarkable Other: There is a well-circumscribed fluid collection in the RIGHT ventral peritoneal space measuring 17.5 cm x 10.2 cm. This collection is not changed from CT exam 10/11/2020. There is a wavy hernia mesh centrally within the collection. There is a midline hernia medial to the contain fluid collection contains loop of nonobstructed small bowel (image 67/4) Musculoskeletal: Posterior lumbar fusion and sacral fusion. No acute findings. Review of the MIP images confirms the above findings. IMPRESSION: CHEST IMPRESSION: 1. No evidence acute pulmonary embolism. 2. No endobronchial foreign body. 3. Mild basilar atelectasis. 4. Single nondisplaced anterior RIGHT sixth rib fracture. 5. Relatively hypodense lesion in the LEFT lobe of the thyroid gland. Recommend non-emergent thyroid ultrasound. Reference: J Am Coll Radiol. 2015 Feb;12(2): 143-50 PELVIS IMPRESSION: 1. No acute findings in the abdomen pelvis. 2. Stable fluid collection in the RIGHT ventral peritoneal space with hernia mesh. 3. Midline ventral hernia contains a loop of nonobstructed small bowel. 4. Nonobstructing LEFT renal calculi. 5. Foley catheter in collapsed bladder. 6. Posterior lumbar fusion and sacral fusion. Electronically Signed   By: Suzy Bouchard M.D.   On: 03/16/2022 16:17   CT Head Wo Contrast  Result Date: 03/16/2022 CLINICAL DATA:  Altered mental  status EXAM: CT HEAD WITHOUT CONTRAST TECHNIQUE: Contiguous axial images were obtained from the base of the skull through the vertex without intravenous contrast. RADIATION DOSE REDUCTION: This exam was performed according to the departmental dose-optimization program which includes automated exposure control, adjustment of the mA and/or kV according to patient size and/or use of iterative reconstruction technique. COMPARISON:  Head CT 08/19/2021.  MRI brain 08/19/2021. FINDINGS: Brain: No evidence of acute infarction, hemorrhage, hydrocephalus, extra-axial collection or mass lesion/mass effect. Again seen is mild diffuse atrophy. There is moderate periventricular deep white matter hypodensity similar to the prior study. Vascular: No hyperdense vessel or unexpected calcification. Skull: Normal. Negative for fracture or focal lesion. Sinuses/Orbits: No acute finding. Other: None. IMPRESSION: 1. No acute intracranial abnormality. 2. Stable mild diffuse atrophy and moderate periventricular deep white matter hypodensity, likely due to chronic small vessel ischemia. Electronically Signed   By: Ronney Asters M.D.   On: 03/16/2022 16:04   DG Chest Portable 1 View  Result Date: 03/16/2022 CLINICAL DATA:  Intubation EXAM: PORTABLE CHEST 1 VIEW COMPARISON:  Earlier 03/16/2022 FINDINGS: Interval placement of ET tube with tip 4.5 cm above the carina. Underinflation. No consolidation, pneumothorax or effusion. Enlarged cardiopericardial silhouette without edema. Bronchovascular crowding. Tortuous aorta. Overlapping cardiac leads. IMPRESSION: 1. Interval placement of ET tube with tip 4.5 cm above the carina. 2. Underinflation with bronchovascular crowding. Electronically Signed   By:  Jill Side M.D.   On: 03/16/2022 15:04   DG Chest Portable 1 View  Result Date: 03/16/2022 CLINICAL DATA:  Choked on steak. EXAM: PORTABLE CHEST 1 VIEW COMPARISON:  Chest x-ray dated October 12, 2020. FINDINGS: The heart size and  mediastinal contours are within normal limits. Both lungs are clear. The visualized skeletal structures are unremarkable. IMPRESSION: No active disease. Electronically Signed   By: Titus Dubin M.D.   On: 03/16/2022 14:20    Microbiology: Results for orders placed or performed during the hospital encounter of 03/16/22  Culture, blood (Routine X 2) w Reflex to ID Panel     Status: Abnormal   Collection Time: 03/16/22  1:44 PM   Specimen: BLOOD  Result Value Ref Range Status   Specimen Description   Final    BLOOD RIGHT WRIST Performed at [facility], [address] [place], [place] [address]    Special Requests   Final    BOTTLES DRAWN AEROBIC AND ANAEROBIC Blood Culture adequate volume Performed at [facility], [address], [place], [place] [address]    Culture  Setup Time   Final    GRAM POSITIVE COCCI AEROBIC BOTTLE ONLY CRITICAL RESULT CALLED TO, READ BACK BY AND VERIFIED WITH: CAROLYN COULTER AT 1931 ON 03/17/22 BY SS    Culture (A)  Final    STAPHYLOCOCCUS EPIDERMIDIS THE SIGNIFICANCE OF ISOLATING THIS ORGANISM FROM A SINGLE SET OF BLOOD CULTURES WHEN MULTIPLE SETS ARE DRAWN IS UNCERTAIN. PLEASE NOTIFY THE MICROBIOLOGY DEPARTMENT WITHIN ONE WEEK IF SPECIATION AND SENSITIVITIES ARE REQUIRED. Performed at [facility], [place] [address], [place], [place] [address]    Report Status 03/19/2022 FINAL  Final  Culture, blood (Routine X 2) w Reflex to ID Panel     Status: None   Collection Time: 03/16/22  1:44 PM   Specimen: BLOOD  Result Value Ref Range Status   Specimen Description BLOOD RIGHT WRIST  Final   Special Requests   Final    BOTTLES DRAWN AEROBIC AND ANAEROBIC Blood Culture adequate volume   Culture   Final    NO GROWTH 5 DAYS Performed at [facility], [address] [place], [place] [address]    Report Status 03/21/2022 FINAL  Final  Blood Culture ID Panel (Reflexed)     Status: Abnormal   Collection Time: 03/16/22  1:44  PM  Result Value Ref Range Status   Enterococcus faecalis NOT DETECTED NOT DETECTED Final   Enterococcus Faecium NOT DETECTED NOT DETECTED Final   Listeria monocytogenes NOT DETECTED NOT DETECTED Final   Staphylococcus species DETECTED (A) NOT DETECTED Final    Comment: CRITICAL RESULT CALLED TO, READ BACK BY AND VERIFIED WITH: CAROLYN COULTER AT 1931 ON 03/17/22 BY SS    Staphylococcus aureus (BCID) NOT DETECTED NOT DETECTED Final   Staphylococcus epidermidis DETECTED (A) NOT DETECTED Final    Comment: CRITICAL RESULT CALLED TO, READ BACK BY AND VERIFIED WITH: CAROLYN COULTER AT 1931 ON 03/17/22 BY SS    Staphylococcus lugdunensis NOT DETECTED NOT DETECTED Final   Streptococcus species NOT DETECTED NOT DETECTED Final   Streptococcus agalactiae NOT DETECTED NOT DETECTED Final   Streptococcus pneumoniae NOT DETECTED NOT DETECTED Final   Streptococcus pyogenes NOT DETECTED NOT DETECTED Final   A.calcoaceticus-baumannii NOT DETECTED NOT DETECTED Final   Bacteroides fragilis NOT DETECTED NOT DETECTED Final   Enterobacterales NOT DETECTED NOT DETECTED Final   Enterobacter cloacae complex NOT DETECTED NOT DETECTED Final   Escherichia  coli NOT DETECTED NOT DETECTED Final   Klebsiella aerogenes NOT DETECTED NOT DETECTED Final   Klebsiella oxytoca NOT DETECTED NOT DETECTED Final   Klebsiella pneumoniae NOT DETECTED NOT DETECTED Final   Proteus species NOT DETECTED NOT DETECTED Final   Salmonella species NOT DETECTED NOT DETECTED Final   Serratia marcescens NOT DETECTED NOT DETECTED Final   Haemophilus influenzae NOT DETECTED NOT DETECTED Final   Neisseria meningitidis NOT DETECTED NOT DETECTED Final   Pseudomonas aeruginosa NOT DETECTED NOT DETECTED Final   Stenotrophomonas maltophilia NOT DETECTED NOT DETECTED Final   Candida albicans NOT DETECTED NOT DETECTED Final   Candida auris NOT DETECTED NOT DETECTED Final   Candida glabrata NOT DETECTED NOT DETECTED Final   Candida krusei NOT  DETECTED NOT DETECTED Final   Candida parapsilosis NOT DETECTED NOT DETECTED Final   Candida tropicalis NOT DETECTED NOT DETECTED Final   Cryptococcus neoformans/gattii NOT DETECTED NOT DETECTED Final   Methicillin resistance mecA/C NOT DETECTED NOT DETECTED Final    Comment: Performed at [facility], [address] [place], [place] [address]  MRSA Next Gen by PCR, Nasal     Status: None   Collection Time: 03/16/22  6:43 PM   Specimen: Nasal Mucosa; Nasal Swab  Result Value Ref Range Status   MRSA by PCR Next Gen NOT DETECTED NOT DETECTED Final    Comment: (NOTE) The GeneXpert MRSA Assay (FDA approved for NASAL specimens only), is one component of a comprehensive MRSA colonization surveillance program. It is not intended to diagnose MRSA infection nor to guide or monitor treatment for MRSA infections. Test performance is not FDA approved in patients less than 91 years old. Performed at [facility], [address] [place], [place] [address]   Culture, Respiratory w Gram Stain     Status: None   Collection Time: 03/16/22  7:32 PM   Specimen: SPU; Respiratory  Result Value Ref Range Status   Specimen Description   Final    SPUTUM Performed at [facility], [address], [place], [place] [address]    Special Requests   Final    SPUTUM Performed at [facility], [address] [place], [place] [address]    Gram Stain   Final    MODERATE WBC PRESENT, PREDOMINANTLY PMN MODERATE GRAM POSITIVE COCCI IN CHAINS FEW GRAM NEGATIVE RODS FEW GRAM POSITIVE COCCI IN CLUSTERS    Culture   Final    FEW CANDIDA ALBICANS NO STAPHYLOCOCCUS AUREUS ISOLATED No Pseudomonas species isolated Performed at [facility], [address], [place], [place] [address]    Report Status 03/20/2022 FINAL  Final  Culture, BAL-quantitative w Gram Stain     Status: Abnormal   Collection Time: 03/17/22 11:05 AM   Specimen: Bronchoalveolar Lavage; Respiratory   Result Value Ref Range Status   Specimen Description   Final    BRONCHIAL ALVEOLAR LAVAGE Performed at [facility], [address], [place],  [address]    Special Requests   Final    NONE Performed at [facility], [address] [place], [place] [address]    Gram Stain   Final    NO WBC SEEN MODERATE GRAM POSITIVE COCCI IN CLUSTERS FEW GRAM POSITIVE COCCI IN PAIRS FEW GRAM POSITIVE COCCI IN CHAINS RARE BUDDING YEAST SEEN    Culture (A)  Final    10,000 COLONIES/mL CANDIDA ALBICANS NO STAPHYLOCOCCUS AUREUS ISOLATED No Pseudomonas species isolated Performed at [facility]  [address], [place], [place] [address]    Report Status 03/20/2022 FINAL  Final    Labs: CBC: Recent Labs  Lab 03/16/22 1344 03/17/22 0402 03/18/22 0530 03/19/22 0500 03/20/22 1144  WBC 12.0* 15.1* 14.5* 12.0*  --   NEUTROABS  --  12.1*  --  9.6*  --   HGB 14.6 12.3* 12.3* 12.0* 14.5  HCT 44.6 38.4* 39.0 36.7* 43.4  MCV 89.0 89.9 90.5 88.6  --   PLT 247 239 185 190  --    Basic Metabolic Panel: Recent Labs  Lab 03/16/22 1344 03/17/22 0402 03/18/22 0530 03/19/22 0500 03/20/22 0520  NA 137 136 137 134* 136  K 3.9 4.2 4.2 3.3* 3.8  CL 104 103 105 99 104  CO2 15* 25 25 24 22  $ GLUCOSE 212* 144* 114* 124* 198*  BUN 24*  QUANTITY NOT SUFFICIENT, UNABLE TO PERFORM TEST 27* 27* 21 20  CREATININE 1.41*  QUANTITY NOT SUFFICIENT, UNABLE TO PERFORM TEST 1.33* 1.37* 1.10 1.11  CALCIUM 9.1 8.5* 8.6* 8.6* 8.8*  MG 1.8 1.8 1.9 1.7 2.1  PHOS 4.3 4.0 3.2 2.6 2.6   Liver Function Tests: Recent Labs  Lab 03/16/22 1344 03/17/22 0402  AST 60* 36  ALT 61* 43  ALKPHOS 96 74  BILITOT 0.9 0.9  PROT 7.2 5.8*  ALBUMIN 3.7 3.0*   CBG: Recent Labs  Lab 03/19/22 2121 03/20/22 0023 03/20/22 0502 03/20/22 0734 03/20/22 1029  GLUCAP 184* 171* 198* 220* 211*    Discharge time spent: less than 30 minutes.  Signed: Ezekiel Slocumb, DO [facility] 03/22/2022

## 2022-03-22 NOTE — Plan of Care (Signed)
  Problem: Education: Goal: Knowledge of General Education information will improve Description: Including pain rating scale, medication(s)/side effects and non-pharmacologic comfort measures Outcome: Progressing   Problem: Health Behavior/Discharge Planning: Goal: Ability to manage health-related needs will improve Outcome: Progressing   Problem: Clinical Measurements: Goal: Ability to maintain clinical measurements within normal limits will improve Outcome: Progressing Goal: Will remain free from infection Outcome: Progressing Goal: Diagnostic test results will improve Outcome: Progressing Goal: Respiratory complications will improve Outcome: Progressing Goal: Cardiovascular complication will be avoided Outcome: Progressing   Problem: Activity: Goal: Risk for activity intolerance will decrease Outcome: Progressing   Problem: Nutrition: Goal: Adequate nutrition will be maintained Outcome: Progressing   Problem: Coping: Goal: Level of anxiety will decrease Outcome: Progressing   Problem: Elimination: Goal: Will not experience complications related to bowel motility Outcome: Progressing Goal: Will not experience complications related to urinary retention Outcome: Progressing   Problem: Pain Managment: Goal: General experience of comfort will improve Outcome: Progressing   Problem: Safety: Goal: Ability to remain free from injury will improve Outcome: Progressing   Problem: Skin Integrity: Goal: Risk for impaired skin integrity will decrease Outcome: Progressing   Problem: Education: Goal: Ability to describe self-care measures that may prevent or decrease complications (Diabetes Survival Skills Education) will improve Outcome: Progressing Goal: Individualized Educational Video(s) Outcome: Progressing   Problem: Coping: Goal: Ability to adjust to condition or change in health will improve Outcome: Progressing   Problem: Fluid Volume: Goal: Ability to  maintain a balanced intake and output will improve Outcome: Progressing   Problem: Health Behavior/Discharge Planning: Goal: Ability to identify and utilize available resources and services will improve Outcome: Progressing Goal: Ability to manage health-related needs will improve Outcome: Progressing   Problem: Metabolic: Goal: Ability to maintain appropriate glucose levels will improve Outcome: Progressing   Problem: Nutritional: Goal: Maintenance of adequate nutrition will improve Outcome: Progressing Goal: Progress toward achieving an optimal weight will improve Outcome: Progressing   Problem: Skin Integrity: Goal: Risk for impaired skin integrity will decrease Outcome: Progressing   Problem: Tissue Perfusion: Goal: Adequacy of tissue perfusion will improve Outcome: Progressing   Problem: Activity: Goal: Ability to tolerate increased activity will improve Outcome: Progressing   Problem: Respiratory: Goal: Ability to maintain a clear airway and adequate ventilation will improve Outcome: Progressing   Problem: Role Relationship: Goal: Method of communication will improve Outcome: Progressing   

## 2022-03-23 DIAGNOSIS — M6281 Muscle weakness (generalized): Secondary | ICD-10-CM | POA: Diagnosis not present

## 2022-03-23 DIAGNOSIS — I2699 Other pulmonary embolism without acute cor pulmonale: Secondary | ICD-10-CM | POA: Diagnosis not present

## 2022-03-23 DIAGNOSIS — F419 Anxiety disorder, unspecified: Secondary | ICD-10-CM | POA: Diagnosis not present

## 2022-03-23 DIAGNOSIS — R5381 Other malaise: Secondary | ICD-10-CM | POA: Diagnosis not present

## 2022-03-23 DIAGNOSIS — Z8673 Personal history of transient ischemic attack (TIA), and cerebral infarction without residual deficits: Secondary | ICD-10-CM | POA: Diagnosis not present

## 2022-03-23 DIAGNOSIS — R1312 Dysphagia, oropharyngeal phase: Secondary | ICD-10-CM | POA: Diagnosis not present

## 2022-03-23 DIAGNOSIS — F03918 Unspecified dementia, unspecified severity, with other behavioral disturbance: Secondary | ICD-10-CM | POA: Diagnosis not present

## 2022-03-23 DIAGNOSIS — R4701 Aphasia: Secondary | ICD-10-CM | POA: Diagnosis not present

## 2022-03-23 DIAGNOSIS — R627 Adult failure to thrive: Secondary | ICD-10-CM | POA: Diagnosis not present

## 2022-03-23 DIAGNOSIS — I10 Essential (primary) hypertension: Secondary | ICD-10-CM | POA: Diagnosis not present

## 2022-03-23 DIAGNOSIS — E785 Hyperlipidemia, unspecified: Secondary | ICD-10-CM | POA: Diagnosis not present

## 2022-03-23 DIAGNOSIS — J690 Pneumonitis due to inhalation of food and vomit: Secondary | ICD-10-CM | POA: Diagnosis not present

## 2022-03-23 DIAGNOSIS — F4323 Adjustment disorder with mixed anxiety and depressed mood: Secondary | ICD-10-CM | POA: Diagnosis not present

## 2022-03-24 DIAGNOSIS — R1312 Dysphagia, oropharyngeal phase: Secondary | ICD-10-CM | POA: Diagnosis not present

## 2022-03-24 DIAGNOSIS — M545 Low back pain, unspecified: Secondary | ICD-10-CM | POA: Diagnosis not present

## 2022-03-24 DIAGNOSIS — M6281 Muscle weakness (generalized): Secondary | ICD-10-CM | POA: Diagnosis not present

## 2022-03-24 DIAGNOSIS — E870 Hyperosmolality and hypernatremia: Secondary | ICD-10-CM | POA: Diagnosis not present

## 2022-03-24 DIAGNOSIS — J690 Pneumonitis due to inhalation of food and vomit: Secondary | ICD-10-CM | POA: Diagnosis not present

## 2022-03-25 DIAGNOSIS — J690 Pneumonitis due to inhalation of food and vomit: Secondary | ICD-10-CM | POA: Diagnosis not present

## 2022-03-25 DIAGNOSIS — R1312 Dysphagia, oropharyngeal phase: Secondary | ICD-10-CM | POA: Diagnosis not present

## 2022-03-25 DIAGNOSIS — R739 Hyperglycemia, unspecified: Secondary | ICD-10-CM | POA: Diagnosis not present

## 2022-03-25 DIAGNOSIS — M6281 Muscle weakness (generalized): Secondary | ICD-10-CM | POA: Diagnosis not present

## 2022-03-26 DIAGNOSIS — M6281 Muscle weakness (generalized): Secondary | ICD-10-CM | POA: Diagnosis not present

## 2022-03-26 DIAGNOSIS — J690 Pneumonitis due to inhalation of food and vomit: Secondary | ICD-10-CM | POA: Diagnosis not present

## 2022-03-26 DIAGNOSIS — R1312 Dysphagia, oropharyngeal phase: Secondary | ICD-10-CM | POA: Diagnosis not present

## 2022-03-28 ENCOUNTER — Encounter: Payer: Self-pay | Admitting: Nurse Practitioner

## 2022-03-28 ENCOUNTER — Non-Acute Institutional Stay: Payer: Medicare HMO | Admitting: Nurse Practitioner

## 2022-03-28 VITALS — BP 126/88 | HR 80 | Temp 97.4°F | Resp 18 | Wt 233.0 lb

## 2022-03-28 DIAGNOSIS — R5381 Other malaise: Secondary | ICD-10-CM

## 2022-03-28 DIAGNOSIS — F0390 Unspecified dementia without behavioral disturbance: Secondary | ICD-10-CM

## 2022-03-28 DIAGNOSIS — R1312 Dysphagia, oropharyngeal phase: Secondary | ICD-10-CM | POA: Diagnosis not present

## 2022-03-28 DIAGNOSIS — Z515 Encounter for palliative care: Secondary | ICD-10-CM

## 2022-03-28 DIAGNOSIS — R630 Anorexia: Secondary | ICD-10-CM | POA: Diagnosis not present

## 2022-03-28 DIAGNOSIS — I63111 Cerebral infarction due to embolism of right vertebral artery: Secondary | ICD-10-CM | POA: Diagnosis not present

## 2022-03-28 DIAGNOSIS — M6281 Muscle weakness (generalized): Secondary | ICD-10-CM | POA: Diagnosis not present

## 2022-03-28 DIAGNOSIS — J690 Pneumonitis due to inhalation of food and vomit: Secondary | ICD-10-CM | POA: Diagnosis not present

## 2022-03-28 NOTE — Progress Notes (Signed)
[facility] Consult Note Telephone: [phone]  Fax: [phone]    Date of encounter: 03/28/22 1:32 PM PATIENT NAME: Keith Carter [id number]   [phone] (home)  DOB: [birth year] MRN: [id number] PRIMARY CARE PROVIDER:    [facility]  RESPONSIBLE PARTY:    Contact Information     Name Relation Home Work Mobile   [place] Daughter [phone]  [phone]   Daemyn, Pelagio [phone]     Smith,Eric Son   [phone]     I met face to face with patient and family in facility. Palliative Care was asked to follow this patient by consultation request of  [place]  to address advance care planning and complex medical decision making. This is a follow up visit.                                   ASSESSMENT AND PLAN / RECOMMENDATIONS:  Symptom Management/Plan: 1. Advance Care Planning; DNR; MOST form 2. Palliative care encounter; Palliative care encounter; Palliative medicine team will continue to support patient, patient's family, and medical team. Visit consisted of counseling and education dealing with the complex and emotionally intense issues of symptom management and palliative care in the setting of serious and potentially life-threatening illness   3. Anorexia; Debility secondary to CVA: dementia; aphasia, progressive, continue to monitor; encourage socialization, oob, he resides at [facility] in the same room as his wife. Overall stable. Continue to monitor for fall risk.    4. Weight gain, reviewed weights, lost less than 1 lb. continue to monitor weight; discussed nutrition, daily intake  10/07/2021 weight 236.3 lbs 11/04/2021 weight 235.4 lbs 01/05/2022 weight 240 lbs 03/22/2022 weight 233 lbs 7 lbs/10 weeks loss 5. f/u 2 to 8 weeks for ongoing monitoring chronic disease progression, ongoing discussions complex medical  decision making   Follow up Palliative Care Visit: PC f/u visit further discussion monitor trends of appetite, weights, monitor for functional, cognitive decline with chronic disease progression, assess any active symptoms, supportive role. Palliative care will continue to follow for complex medical decision making, advance care planning, and clarification of goals. Return 8 weeks or prn.   I spent 47 minutes providing this consultation starting at 1:45 pm. More than 50% of the time in this consultation was spent in counseling and care coordination.   PPS: 40%   Chief Complaint: Follow up palliative consult for complex medical decision making   HISTORY OF PRESENT ILLNESS:  Keith Carter is a 67 y.o. year old male  with multiple medical problems including PE/dementia with delirium, left frontal stroke with residual aphasia, FTT, BPH, HTN, DM, TIA. Keith. Carter is followed by chronic pain with Dr Lowella Dandy. Keith. Carter resides at [place] at [facility] with his wife as his roommate. Keith. Carter requires assistance with transfers, mobility to w/c where he is mobile propeling with his feet around the facility. Requires assistance with bathing, dressing. Keith Carter does feed himself with appetite improving. Hospitalized 03/16/2022 to 03/22/2022 for foreign body aspiration with aspiration pna, acute respiratory failure with hypoxia, unresponsive. CT head in the ER which shows mild diffuse atrophy and moderate periventricular deep white matter hypodensity due to small chronic vessel ischemia. Required ventilator.  CT angio PE protocol right lower lobe pneumonia suggestive of aspiration.  Underwent bronchoscopy with meat particle left lung were removed with  aspiration of bronchus, mucous plugging of RLL and RML with therapeutic aspiration of tracheobronchial tree.  BAL at RML due to mucopurulent phlegm with extubated extubation on 03/17/2022. Transferred under [facility] on 03/18/2022, stabilized and d/c back to [facility] with  hospice. When came to [facility], family chose STR, therapy rather than hospice. Purpose of today PC f/u visit further discussion monitor trends of appetite, weights, monitor for functional, cognitive decline with chronic disease progression, assess any active symptoms, supportive role. Keith Carter at present is sitting up in the w/c, continues to propel himself, appears to be back to baseline prior to event. Keith Carter is feeding himself with decreased appetite, noted weight loss slightly though overall weight gain since at [facility] when transitioned to LTC. At present Keith Carter is sitting in the w/c with his wife (his roommate) with him. We talked about recent events, hospitalization, currently therapy and functional abilities. Keith Carter was able to answer questions, ros. Keith Carter was interactive, pleasant appears comfortable. Keith Carter endorses he is doing better. Keith Carter talked about quality of life, appetite, therapy, goals with therapy. Talked about family. Supportive visit, provided. Medications, hospital records, poc, goc reviewed. Attempted to contact [place]. Currently Keith Carter appears stable. Will continue to follow/monitor; updated staff  I reviewed available labs, medications, imaging, studies and related documents from the EMR.  Records reviewed and summarized above.  Physical Exam: General: obese, debilitated pleasant male, pleasant EYES:  lids intact ENMT: oral mucous membranes moist CV: S1S2, RRR Pulmonary: LCTA MSK: w/c dependent, Skin: warm and dry Neuro:  + generalized weakness,  + cognitive impairment, aphasia improving Psych: non-anxious affect, Alert, oriented  Thank you for the opportunity to participate in the care of Keith. Carter. Please call our office at [phone] if we can be of additional assistance.   Urie Loughner Ihor Gully, NP

## 2022-03-29 DIAGNOSIS — M6281 Muscle weakness (generalized): Secondary | ICD-10-CM | POA: Diagnosis not present

## 2022-03-29 DIAGNOSIS — E46 Unspecified protein-calorie malnutrition: Secondary | ICD-10-CM | POA: Diagnosis not present

## 2022-03-29 DIAGNOSIS — J690 Pneumonitis due to inhalation of food and vomit: Secondary | ICD-10-CM | POA: Diagnosis not present

## 2022-03-29 DIAGNOSIS — E1169 Type 2 diabetes mellitus with other specified complication: Secondary | ICD-10-CM | POA: Diagnosis not present

## 2022-03-29 DIAGNOSIS — R1312 Dysphagia, oropharyngeal phase: Secondary | ICD-10-CM | POA: Diagnosis not present

## 2022-03-29 DIAGNOSIS — E785 Hyperlipidemia, unspecified: Secondary | ICD-10-CM | POA: Diagnosis not present

## 2022-03-29 DIAGNOSIS — I2699 Other pulmonary embolism without acute cor pulmonale: Secondary | ICD-10-CM | POA: Diagnosis not present

## 2022-03-29 DIAGNOSIS — E113291 Type 2 diabetes mellitus with mild nonproliferative diabetic retinopathy without macular edema, right eye: Secondary | ICD-10-CM | POA: Diagnosis not present

## 2022-03-30 DIAGNOSIS — J690 Pneumonitis due to inhalation of food and vomit: Secondary | ICD-10-CM | POA: Diagnosis not present

## 2022-03-30 DIAGNOSIS — M6281 Muscle weakness (generalized): Secondary | ICD-10-CM | POA: Diagnosis not present

## 2022-03-30 DIAGNOSIS — R1312 Dysphagia, oropharyngeal phase: Secondary | ICD-10-CM | POA: Diagnosis not present

## 2022-03-31 DIAGNOSIS — M6281 Muscle weakness (generalized): Secondary | ICD-10-CM | POA: Diagnosis not present

## 2022-03-31 DIAGNOSIS — E1169 Type 2 diabetes mellitus with other specified complication: Secondary | ICD-10-CM | POA: Diagnosis not present

## 2022-03-31 DIAGNOSIS — J690 Pneumonitis due to inhalation of food and vomit: Secondary | ICD-10-CM | POA: Diagnosis not present

## 2022-03-31 DIAGNOSIS — R1312 Dysphagia, oropharyngeal phase: Secondary | ICD-10-CM | POA: Diagnosis not present

## 2022-04-01 DIAGNOSIS — E871 Hypo-osmolality and hyponatremia: Secondary | ICD-10-CM | POA: Diagnosis not present

## 2022-04-01 DIAGNOSIS — R1312 Dysphagia, oropharyngeal phase: Secondary | ICD-10-CM | POA: Diagnosis not present

## 2022-04-01 DIAGNOSIS — R41841 Cognitive communication deficit: Secondary | ICD-10-CM | POA: Diagnosis not present

## 2022-04-01 DIAGNOSIS — J690 Pneumonitis due to inhalation of food and vomit: Secondary | ICD-10-CM | POA: Diagnosis not present

## 2022-04-01 DIAGNOSIS — R739 Hyperglycemia, unspecified: Secondary | ICD-10-CM | POA: Diagnosis not present

## 2022-04-01 DIAGNOSIS — M6281 Muscle weakness (generalized): Secondary | ICD-10-CM | POA: Diagnosis not present

## 2022-04-04 DIAGNOSIS — R41841 Cognitive communication deficit: Secondary | ICD-10-CM | POA: Diagnosis not present

## 2022-04-04 DIAGNOSIS — M6281 Muscle weakness (generalized): Secondary | ICD-10-CM | POA: Diagnosis not present

## 2022-04-04 DIAGNOSIS — J690 Pneumonitis due to inhalation of food and vomit: Secondary | ICD-10-CM | POA: Diagnosis not present

## 2022-04-04 DIAGNOSIS — R1312 Dysphagia, oropharyngeal phase: Secondary | ICD-10-CM | POA: Diagnosis not present

## 2022-04-05 DIAGNOSIS — R1312 Dysphagia, oropharyngeal phase: Secondary | ICD-10-CM | POA: Diagnosis not present

## 2022-04-05 DIAGNOSIS — R41841 Cognitive communication deficit: Secondary | ICD-10-CM | POA: Diagnosis not present

## 2022-04-05 DIAGNOSIS — J690 Pneumonitis due to inhalation of food and vomit: Secondary | ICD-10-CM | POA: Diagnosis not present

## 2022-04-05 DIAGNOSIS — M6281 Muscle weakness (generalized): Secondary | ICD-10-CM | POA: Diagnosis not present

## 2022-04-06 DIAGNOSIS — R1312 Dysphagia, oropharyngeal phase: Secondary | ICD-10-CM | POA: Diagnosis not present

## 2022-04-06 DIAGNOSIS — J690 Pneumonitis due to inhalation of food and vomit: Secondary | ICD-10-CM | POA: Diagnosis not present

## 2022-04-06 DIAGNOSIS — M6281 Muscle weakness (generalized): Secondary | ICD-10-CM | POA: Diagnosis not present

## 2022-04-06 DIAGNOSIS — R41841 Cognitive communication deficit: Secondary | ICD-10-CM | POA: Diagnosis not present

## 2022-04-06 DIAGNOSIS — F4323 Adjustment disorder with mixed anxiety and depressed mood: Secondary | ICD-10-CM | POA: Diagnosis not present

## 2022-04-07 DIAGNOSIS — J690 Pneumonitis due to inhalation of food and vomit: Secondary | ICD-10-CM | POA: Diagnosis not present

## 2022-04-07 DIAGNOSIS — M6281 Muscle weakness (generalized): Secondary | ICD-10-CM | POA: Diagnosis not present

## 2022-04-07 DIAGNOSIS — R41841 Cognitive communication deficit: Secondary | ICD-10-CM | POA: Diagnosis not present

## 2022-04-07 DIAGNOSIS — E119 Type 2 diabetes mellitus without complications: Secondary | ICD-10-CM | POA: Diagnosis not present

## 2022-04-07 DIAGNOSIS — R1312 Dysphagia, oropharyngeal phase: Secondary | ICD-10-CM | POA: Diagnosis not present

## 2022-04-08 DIAGNOSIS — J690 Pneumonitis due to inhalation of food and vomit: Secondary | ICD-10-CM | POA: Diagnosis not present

## 2022-04-08 DIAGNOSIS — M6281 Muscle weakness (generalized): Secondary | ICD-10-CM | POA: Diagnosis not present

## 2022-04-08 DIAGNOSIS — R1312 Dysphagia, oropharyngeal phase: Secondary | ICD-10-CM | POA: Diagnosis not present

## 2022-04-08 DIAGNOSIS — M545 Low back pain, unspecified: Secondary | ICD-10-CM | POA: Diagnosis not present

## 2022-04-08 DIAGNOSIS — R41841 Cognitive communication deficit: Secondary | ICD-10-CM | POA: Diagnosis not present

## 2022-04-10 DIAGNOSIS — R41841 Cognitive communication deficit: Secondary | ICD-10-CM | POA: Diagnosis not present

## 2022-04-10 DIAGNOSIS — R1312 Dysphagia, oropharyngeal phase: Secondary | ICD-10-CM | POA: Diagnosis not present

## 2022-04-10 DIAGNOSIS — J690 Pneumonitis due to inhalation of food and vomit: Secondary | ICD-10-CM | POA: Diagnosis not present

## 2022-04-10 DIAGNOSIS — M6281 Muscle weakness (generalized): Secondary | ICD-10-CM | POA: Diagnosis not present

## 2022-04-11 DIAGNOSIS — R1312 Dysphagia, oropharyngeal phase: Secondary | ICD-10-CM | POA: Diagnosis not present

## 2022-04-11 DIAGNOSIS — F0280 Dementia in other diseases classified elsewhere without behavioral disturbance: Secondary | ICD-10-CM | POA: Diagnosis not present

## 2022-04-11 DIAGNOSIS — M6281 Muscle weakness (generalized): Secondary | ICD-10-CM | POA: Diagnosis not present

## 2022-04-11 DIAGNOSIS — F331 Major depressive disorder, recurrent, moderate: Secondary | ICD-10-CM | POA: Diagnosis not present

## 2022-04-11 DIAGNOSIS — G894 Chronic pain syndrome: Secondary | ICD-10-CM | POA: Diagnosis not present

## 2022-04-11 DIAGNOSIS — J690 Pneumonitis due to inhalation of food and vomit: Secondary | ICD-10-CM | POA: Diagnosis not present

## 2022-04-11 DIAGNOSIS — F419 Anxiety disorder, unspecified: Secondary | ICD-10-CM | POA: Diagnosis not present

## 2022-04-11 DIAGNOSIS — G309 Alzheimer's disease, unspecified: Secondary | ICD-10-CM | POA: Diagnosis not present

## 2022-04-11 DIAGNOSIS — R41841 Cognitive communication deficit: Secondary | ICD-10-CM | POA: Diagnosis not present

## 2022-04-12 DIAGNOSIS — R41841 Cognitive communication deficit: Secondary | ICD-10-CM | POA: Diagnosis not present

## 2022-04-12 DIAGNOSIS — J690 Pneumonitis due to inhalation of food and vomit: Secondary | ICD-10-CM | POA: Diagnosis not present

## 2022-04-12 DIAGNOSIS — M6281 Muscle weakness (generalized): Secondary | ICD-10-CM | POA: Diagnosis not present

## 2022-04-12 DIAGNOSIS — R1312 Dysphagia, oropharyngeal phase: Secondary | ICD-10-CM | POA: Diagnosis not present

## 2022-04-12 DIAGNOSIS — K5900 Constipation, unspecified: Secondary | ICD-10-CM | POA: Diagnosis not present

## 2022-04-13 DIAGNOSIS — M6281 Muscle weakness (generalized): Secondary | ICD-10-CM | POA: Diagnosis not present

## 2022-04-13 DIAGNOSIS — R1312 Dysphagia, oropharyngeal phase: Secondary | ICD-10-CM | POA: Diagnosis not present

## 2022-04-13 DIAGNOSIS — J690 Pneumonitis due to inhalation of food and vomit: Secondary | ICD-10-CM | POA: Diagnosis not present

## 2022-04-13 DIAGNOSIS — R41841 Cognitive communication deficit: Secondary | ICD-10-CM | POA: Diagnosis not present

## 2022-04-14 DIAGNOSIS — R41841 Cognitive communication deficit: Secondary | ICD-10-CM | POA: Diagnosis not present

## 2022-04-14 DIAGNOSIS — R1312 Dysphagia, oropharyngeal phase: Secondary | ICD-10-CM | POA: Diagnosis not present

## 2022-04-14 DIAGNOSIS — J690 Pneumonitis due to inhalation of food and vomit: Secondary | ICD-10-CM | POA: Diagnosis not present

## 2022-04-14 DIAGNOSIS — M6281 Muscle weakness (generalized): Secondary | ICD-10-CM | POA: Diagnosis not present

## 2022-04-15 DIAGNOSIS — R41841 Cognitive communication deficit: Secondary | ICD-10-CM | POA: Diagnosis not present

## 2022-04-15 DIAGNOSIS — M6281 Muscle weakness (generalized): Secondary | ICD-10-CM | POA: Diagnosis not present

## 2022-04-15 DIAGNOSIS — R1312 Dysphagia, oropharyngeal phase: Secondary | ICD-10-CM | POA: Diagnosis not present

## 2022-04-15 DIAGNOSIS — J690 Pneumonitis due to inhalation of food and vomit: Secondary | ICD-10-CM | POA: Diagnosis not present

## 2022-04-18 DIAGNOSIS — R41841 Cognitive communication deficit: Secondary | ICD-10-CM | POA: Diagnosis not present

## 2022-04-18 DIAGNOSIS — J690 Pneumonitis due to inhalation of food and vomit: Secondary | ICD-10-CM | POA: Diagnosis not present

## 2022-04-18 DIAGNOSIS — M6281 Muscle weakness (generalized): Secondary | ICD-10-CM | POA: Diagnosis not present

## 2022-04-18 DIAGNOSIS — R1312 Dysphagia, oropharyngeal phase: Secondary | ICD-10-CM | POA: Diagnosis not present

## 2022-04-19 DIAGNOSIS — R1312 Dysphagia, oropharyngeal phase: Secondary | ICD-10-CM | POA: Diagnosis not present

## 2022-04-19 DIAGNOSIS — M6281 Muscle weakness (generalized): Secondary | ICD-10-CM | POA: Diagnosis not present

## 2022-04-19 DIAGNOSIS — J690 Pneumonitis due to inhalation of food and vomit: Secondary | ICD-10-CM | POA: Diagnosis not present

## 2022-04-19 DIAGNOSIS — R41841 Cognitive communication deficit: Secondary | ICD-10-CM | POA: Diagnosis not present

## 2022-04-20 DIAGNOSIS — M6281 Muscle weakness (generalized): Secondary | ICD-10-CM | POA: Diagnosis not present

## 2022-04-20 DIAGNOSIS — R1312 Dysphagia, oropharyngeal phase: Secondary | ICD-10-CM | POA: Diagnosis not present

## 2022-04-20 DIAGNOSIS — F419 Anxiety disorder, unspecified: Secondary | ICD-10-CM | POA: Diagnosis not present

## 2022-04-20 DIAGNOSIS — R41841 Cognitive communication deficit: Secondary | ICD-10-CM | POA: Diagnosis not present

## 2022-04-20 DIAGNOSIS — J690 Pneumonitis due to inhalation of food and vomit: Secondary | ICD-10-CM | POA: Diagnosis not present

## 2022-04-20 DIAGNOSIS — F331 Major depressive disorder, recurrent, moderate: Secondary | ICD-10-CM | POA: Diagnosis not present

## 2022-04-21 DIAGNOSIS — J690 Pneumonitis due to inhalation of food and vomit: Secondary | ICD-10-CM | POA: Diagnosis not present

## 2022-04-21 DIAGNOSIS — M6281 Muscle weakness (generalized): Secondary | ICD-10-CM | POA: Diagnosis not present

## 2022-04-21 DIAGNOSIS — I10 Essential (primary) hypertension: Secondary | ICD-10-CM | POA: Diagnosis not present

## 2022-04-21 DIAGNOSIS — R1312 Dysphagia, oropharyngeal phase: Secondary | ICD-10-CM | POA: Diagnosis not present

## 2022-04-21 DIAGNOSIS — E11319 Type 2 diabetes mellitus with unspecified diabetic retinopathy without macular edema: Secondary | ICD-10-CM | POA: Diagnosis not present

## 2022-04-21 DIAGNOSIS — R41841 Cognitive communication deficit: Secondary | ICD-10-CM | POA: Diagnosis not present

## 2022-04-21 DIAGNOSIS — Z8673 Personal history of transient ischemic attack (TIA), and cerebral infarction without residual deficits: Secondary | ICD-10-CM | POA: Diagnosis not present

## 2022-04-22 DIAGNOSIS — M6281 Muscle weakness (generalized): Secondary | ICD-10-CM | POA: Diagnosis not present

## 2022-04-22 DIAGNOSIS — R41841 Cognitive communication deficit: Secondary | ICD-10-CM | POA: Diagnosis not present

## 2022-04-22 DIAGNOSIS — J690 Pneumonitis due to inhalation of food and vomit: Secondary | ICD-10-CM | POA: Diagnosis not present

## 2022-04-22 DIAGNOSIS — R1312 Dysphagia, oropharyngeal phase: Secondary | ICD-10-CM | POA: Diagnosis not present

## 2022-04-24 DIAGNOSIS — M6281 Muscle weakness (generalized): Secondary | ICD-10-CM | POA: Diagnosis not present

## 2022-04-24 DIAGNOSIS — J690 Pneumonitis due to inhalation of food and vomit: Secondary | ICD-10-CM | POA: Diagnosis not present

## 2022-04-24 DIAGNOSIS — R41841 Cognitive communication deficit: Secondary | ICD-10-CM | POA: Diagnosis not present

## 2022-04-24 DIAGNOSIS — R1312 Dysphagia, oropharyngeal phase: Secondary | ICD-10-CM | POA: Diagnosis not present

## 2022-04-25 DIAGNOSIS — J690 Pneumonitis due to inhalation of food and vomit: Secondary | ICD-10-CM | POA: Diagnosis not present

## 2022-04-25 DIAGNOSIS — M6281 Muscle weakness (generalized): Secondary | ICD-10-CM | POA: Diagnosis not present

## 2022-04-25 DIAGNOSIS — R41841 Cognitive communication deficit: Secondary | ICD-10-CM | POA: Diagnosis not present

## 2022-04-25 DIAGNOSIS — R1312 Dysphagia, oropharyngeal phase: Secondary | ICD-10-CM | POA: Diagnosis not present

## 2022-04-26 DIAGNOSIS — J690 Pneumonitis due to inhalation of food and vomit: Secondary | ICD-10-CM | POA: Diagnosis not present

## 2022-04-26 DIAGNOSIS — M6281 Muscle weakness (generalized): Secondary | ICD-10-CM | POA: Diagnosis not present

## 2022-04-26 DIAGNOSIS — R1312 Dysphagia, oropharyngeal phase: Secondary | ICD-10-CM | POA: Diagnosis not present

## 2022-04-26 DIAGNOSIS — R609 Edema, unspecified: Secondary | ICD-10-CM | POA: Diagnosis not present

## 2022-04-26 DIAGNOSIS — R41841 Cognitive communication deficit: Secondary | ICD-10-CM | POA: Diagnosis not present

## 2022-04-27 DIAGNOSIS — Z760 Encounter for issue of repeat prescription: Secondary | ICD-10-CM | POA: Diagnosis not present

## 2022-04-27 DIAGNOSIS — R609 Edema, unspecified: Secondary | ICD-10-CM | POA: Diagnosis not present

## 2022-04-27 DIAGNOSIS — M6281 Muscle weakness (generalized): Secondary | ICD-10-CM | POA: Diagnosis not present

## 2022-04-27 DIAGNOSIS — R1312 Dysphagia, oropharyngeal phase: Secondary | ICD-10-CM | POA: Diagnosis not present

## 2022-04-27 DIAGNOSIS — R41841 Cognitive communication deficit: Secondary | ICD-10-CM | POA: Diagnosis not present

## 2022-04-27 DIAGNOSIS — J690 Pneumonitis due to inhalation of food and vomit: Secondary | ICD-10-CM | POA: Diagnosis not present

## 2022-04-28 DIAGNOSIS — R1312 Dysphagia, oropharyngeal phase: Secondary | ICD-10-CM | POA: Diagnosis not present

## 2022-04-28 DIAGNOSIS — J690 Pneumonitis due to inhalation of food and vomit: Secondary | ICD-10-CM | POA: Diagnosis not present

## 2022-04-28 DIAGNOSIS — R41841 Cognitive communication deficit: Secondary | ICD-10-CM | POA: Diagnosis not present

## 2022-04-28 DIAGNOSIS — M6281 Muscle weakness (generalized): Secondary | ICD-10-CM | POA: Diagnosis not present

## 2022-04-29 DIAGNOSIS — R1312 Dysphagia, oropharyngeal phase: Secondary | ICD-10-CM | POA: Diagnosis not present

## 2022-04-29 DIAGNOSIS — R41841 Cognitive communication deficit: Secondary | ICD-10-CM | POA: Diagnosis not present

## 2022-04-29 DIAGNOSIS — M6281 Muscle weakness (generalized): Secondary | ICD-10-CM | POA: Diagnosis not present

## 2022-04-29 DIAGNOSIS — J690 Pneumonitis due to inhalation of food and vomit: Secondary | ICD-10-CM | POA: Diagnosis not present

## 2022-04-29 DIAGNOSIS — R600 Localized edema: Secondary | ICD-10-CM | POA: Diagnosis not present

## 2022-05-02 DIAGNOSIS — M6281 Muscle weakness (generalized): Secondary | ICD-10-CM | POA: Diagnosis not present

## 2022-05-02 DIAGNOSIS — J690 Pneumonitis due to inhalation of food and vomit: Secondary | ICD-10-CM | POA: Diagnosis not present

## 2022-05-02 DIAGNOSIS — R609 Edema, unspecified: Secondary | ICD-10-CM | POA: Diagnosis not present

## 2022-05-02 DIAGNOSIS — R1312 Dysphagia, oropharyngeal phase: Secondary | ICD-10-CM | POA: Diagnosis not present

## 2022-05-02 DIAGNOSIS — R41841 Cognitive communication deficit: Secondary | ICD-10-CM | POA: Diagnosis not present

## 2022-05-03 DIAGNOSIS — M6281 Muscle weakness (generalized): Secondary | ICD-10-CM | POA: Diagnosis not present

## 2022-05-03 DIAGNOSIS — R1312 Dysphagia, oropharyngeal phase: Secondary | ICD-10-CM | POA: Diagnosis not present

## 2022-05-03 DIAGNOSIS — R41841 Cognitive communication deficit: Secondary | ICD-10-CM | POA: Diagnosis not present

## 2022-05-03 DIAGNOSIS — J690 Pneumonitis due to inhalation of food and vomit: Secondary | ICD-10-CM | POA: Diagnosis not present

## 2022-05-04 DIAGNOSIS — J690 Pneumonitis due to inhalation of food and vomit: Secondary | ICD-10-CM | POA: Diagnosis not present

## 2022-05-04 DIAGNOSIS — R1312 Dysphagia, oropharyngeal phase: Secondary | ICD-10-CM | POA: Diagnosis not present

## 2022-05-04 DIAGNOSIS — R41841 Cognitive communication deficit: Secondary | ICD-10-CM | POA: Diagnosis not present

## 2022-05-04 DIAGNOSIS — M6281 Muscle weakness (generalized): Secondary | ICD-10-CM | POA: Diagnosis not present

## 2022-05-04 DIAGNOSIS — F331 Major depressive disorder, recurrent, moderate: Secondary | ICD-10-CM | POA: Diagnosis not present

## 2022-05-04 DIAGNOSIS — I10 Essential (primary) hypertension: Secondary | ICD-10-CM | POA: Diagnosis not present

## 2022-05-04 DIAGNOSIS — F419 Anxiety disorder, unspecified: Secondary | ICD-10-CM | POA: Diagnosis not present

## 2022-05-05 DIAGNOSIS — R1312 Dysphagia, oropharyngeal phase: Secondary | ICD-10-CM | POA: Diagnosis not present

## 2022-05-05 DIAGNOSIS — I10 Essential (primary) hypertension: Secondary | ICD-10-CM | POA: Diagnosis not present

## 2022-05-05 DIAGNOSIS — J690 Pneumonitis due to inhalation of food and vomit: Secondary | ICD-10-CM | POA: Diagnosis not present

## 2022-05-05 DIAGNOSIS — R41841 Cognitive communication deficit: Secondary | ICD-10-CM | POA: Diagnosis not present

## 2022-05-05 DIAGNOSIS — M6281 Muscle weakness (generalized): Secondary | ICD-10-CM | POA: Diagnosis not present

## 2022-05-06 DIAGNOSIS — J690 Pneumonitis due to inhalation of food and vomit: Secondary | ICD-10-CM | POA: Diagnosis not present

## 2022-05-06 DIAGNOSIS — R41841 Cognitive communication deficit: Secondary | ICD-10-CM | POA: Diagnosis not present

## 2022-05-06 DIAGNOSIS — R1312 Dysphagia, oropharyngeal phase: Secondary | ICD-10-CM | POA: Diagnosis not present

## 2022-05-06 DIAGNOSIS — I10 Essential (primary) hypertension: Secondary | ICD-10-CM | POA: Diagnosis not present

## 2022-05-06 DIAGNOSIS — M6281 Muscle weakness (generalized): Secondary | ICD-10-CM | POA: Diagnosis not present

## 2022-05-09 DIAGNOSIS — R41841 Cognitive communication deficit: Secondary | ICD-10-CM | POA: Diagnosis not present

## 2022-05-09 DIAGNOSIS — R1312 Dysphagia, oropharyngeal phase: Secondary | ICD-10-CM | POA: Diagnosis not present

## 2022-05-09 DIAGNOSIS — J690 Pneumonitis due to inhalation of food and vomit: Secondary | ICD-10-CM | POA: Diagnosis not present

## 2022-05-09 DIAGNOSIS — M6281 Muscle weakness (generalized): Secondary | ICD-10-CM | POA: Diagnosis not present

## 2022-05-10 DIAGNOSIS — J690 Pneumonitis due to inhalation of food and vomit: Secondary | ICD-10-CM | POA: Diagnosis not present

## 2022-05-10 DIAGNOSIS — R41841 Cognitive communication deficit: Secondary | ICD-10-CM | POA: Diagnosis not present

## 2022-05-10 DIAGNOSIS — R1312 Dysphagia, oropharyngeal phase: Secondary | ICD-10-CM | POA: Diagnosis not present

## 2022-05-10 DIAGNOSIS — M6281 Muscle weakness (generalized): Secondary | ICD-10-CM | POA: Diagnosis not present

## 2022-05-10 DIAGNOSIS — I10 Essential (primary) hypertension: Secondary | ICD-10-CM | POA: Diagnosis not present

## 2022-05-11 DIAGNOSIS — J690 Pneumonitis due to inhalation of food and vomit: Secondary | ICD-10-CM | POA: Diagnosis not present

## 2022-05-11 DIAGNOSIS — R41841 Cognitive communication deficit: Secondary | ICD-10-CM | POA: Diagnosis not present

## 2022-05-11 DIAGNOSIS — R1312 Dysphagia, oropharyngeal phase: Secondary | ICD-10-CM | POA: Diagnosis not present

## 2022-05-11 DIAGNOSIS — M6281 Muscle weakness (generalized): Secondary | ICD-10-CM | POA: Diagnosis not present

## 2022-05-11 DIAGNOSIS — I10 Essential (primary) hypertension: Secondary | ICD-10-CM | POA: Diagnosis not present

## 2022-05-12 DIAGNOSIS — R41841 Cognitive communication deficit: Secondary | ICD-10-CM | POA: Diagnosis not present

## 2022-05-12 DIAGNOSIS — R1312 Dysphagia, oropharyngeal phase: Secondary | ICD-10-CM | POA: Diagnosis not present

## 2022-05-12 DIAGNOSIS — J690 Pneumonitis due to inhalation of food and vomit: Secondary | ICD-10-CM | POA: Diagnosis not present

## 2022-05-12 DIAGNOSIS — M6281 Muscle weakness (generalized): Secondary | ICD-10-CM | POA: Diagnosis not present

## 2022-05-12 IMAGING — CT CT MAXILLOFACIAL W/O CM
3 series · 15 of 47 positions shown, 18 images · non-contrast
Comparison: CT head 10/12/2020, MR head 10/11/2020

CLINICAL DATA: Fall out of bed, hit right-side of face

EXAM:
CT HEAD WITHOUT CONTRAST
CT MAXILLOFACIAL WITHOUT CONTRAST
TECHNIQUE: Multidetector CT imaging of the head and maxillofacial structures
were performed using the standard protocol without intravenous
contrast. Multiplanar CT image reconstructions of the maxillofacial
structures were also generated.
RADIATION DOSE REDUCTION: This exam was performed according to the
departmental dose-optimization program which includes automated
exposure control, adjustment of the mA and/or kV according to
patient size and/or use of iterative reconstruction technique.

[Series 2: max soft · axial · 0.38mm/px · z∈[-144,+12]mm · 9 of 92 slices shown, 12 images]
[im 7/92  brain]
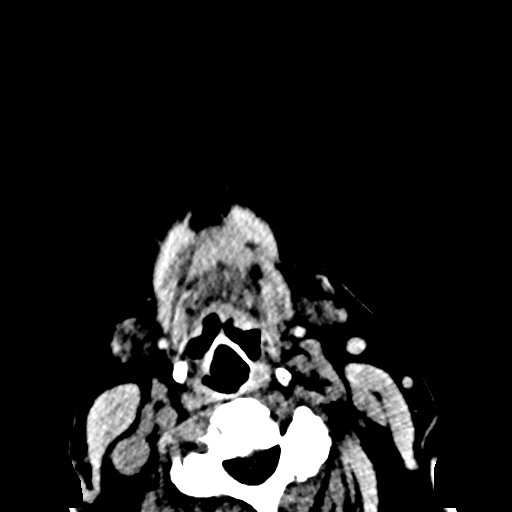
[im 7/92  bone]
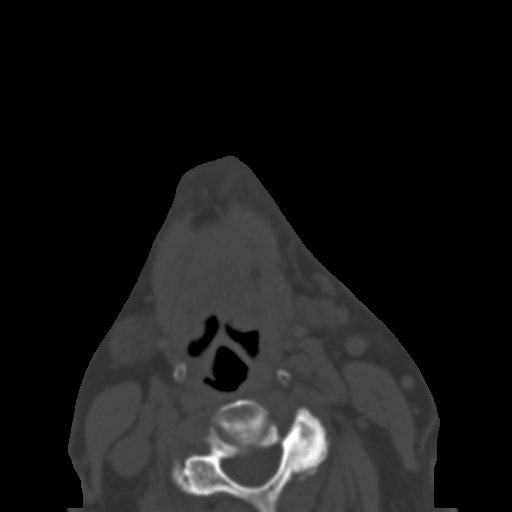
[im 16/92  bone]
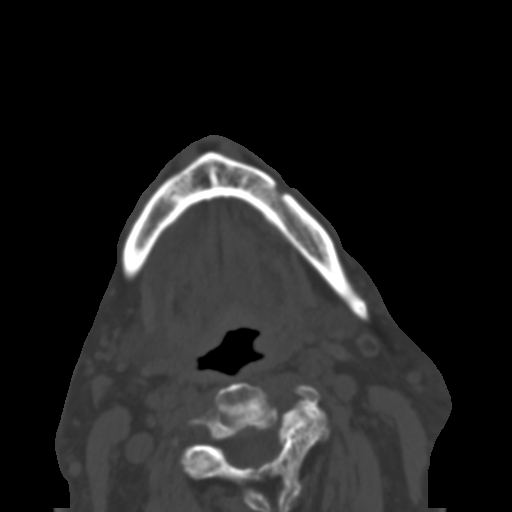
[im 26/92  bone]
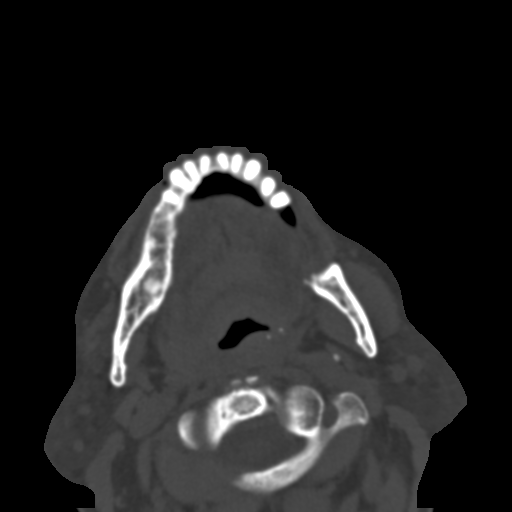
[im 35/92  bone]
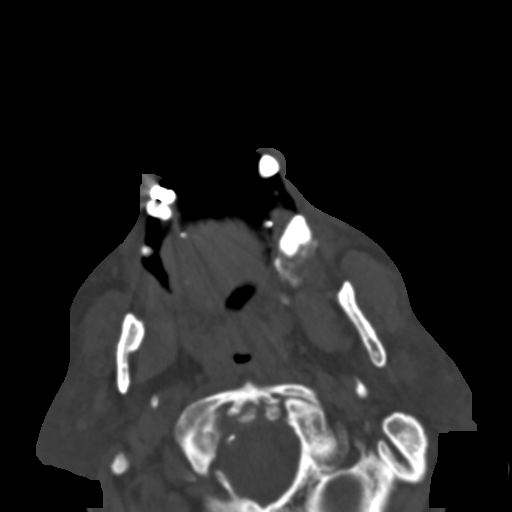
[im 48/92  brain]
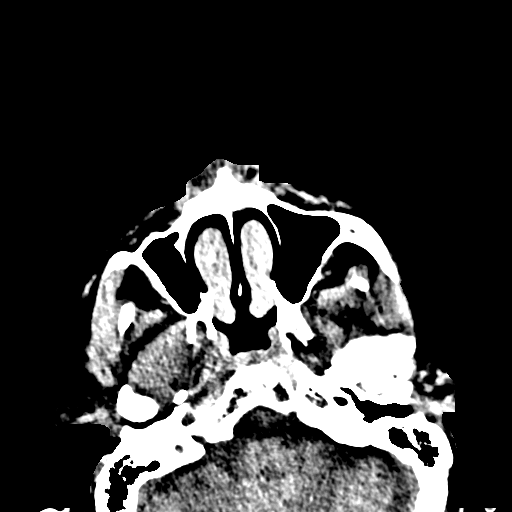
[im 48/92  bone]
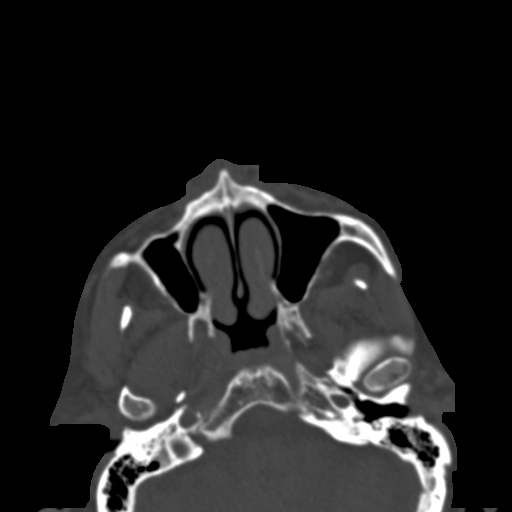
[im 57/92  bone]
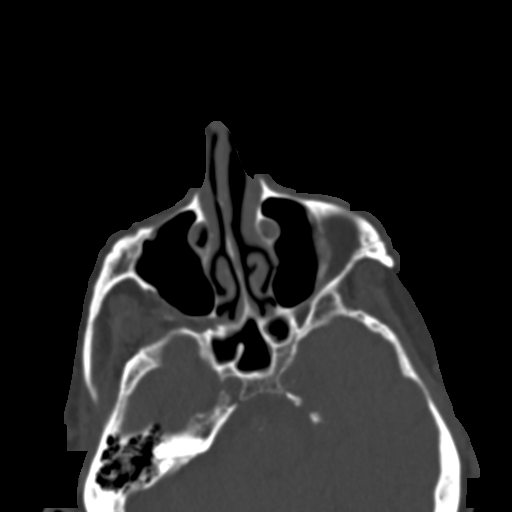
[im 66/92  bone]
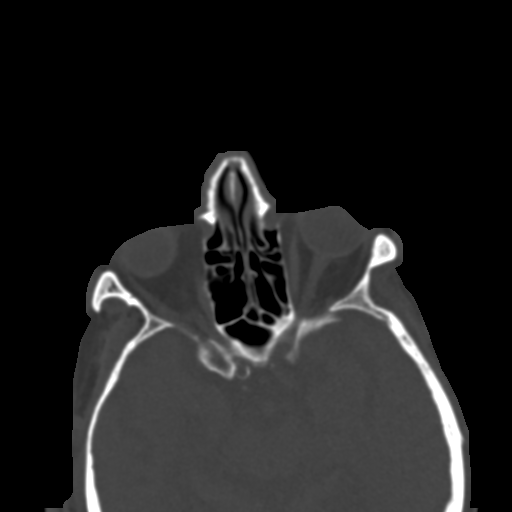
[im 76/92  bone]
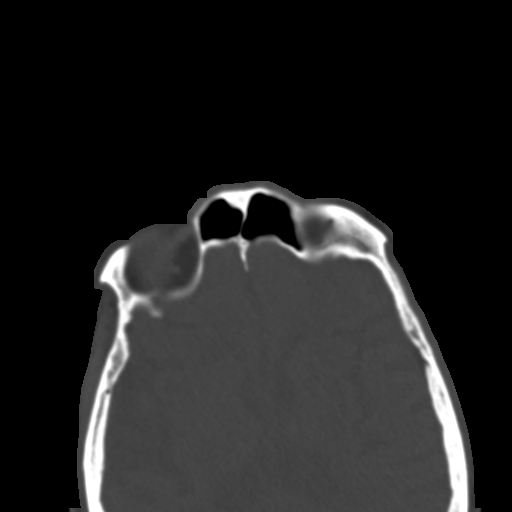
[im 85/92  brain]
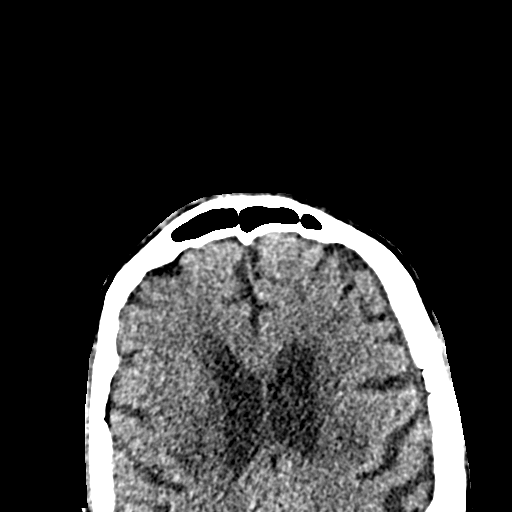
[im 85/92  bone]
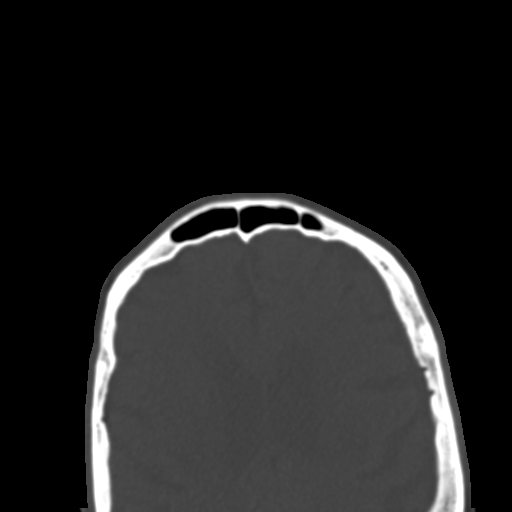

[Series 6: coronal soft · coronal · 0.36mm/px · 3 of 70 slices shown]
[im 24/70  bone]
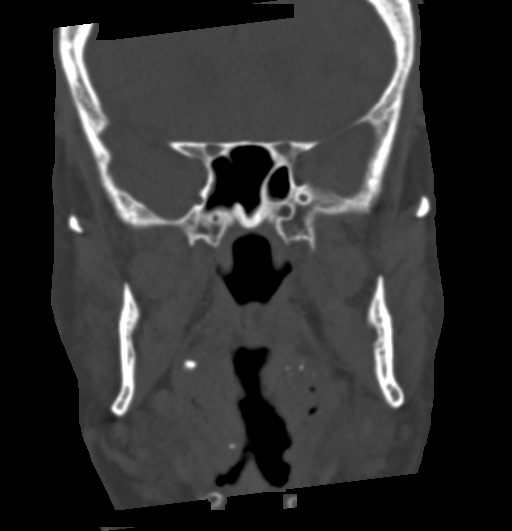
[im 31/70  bone]
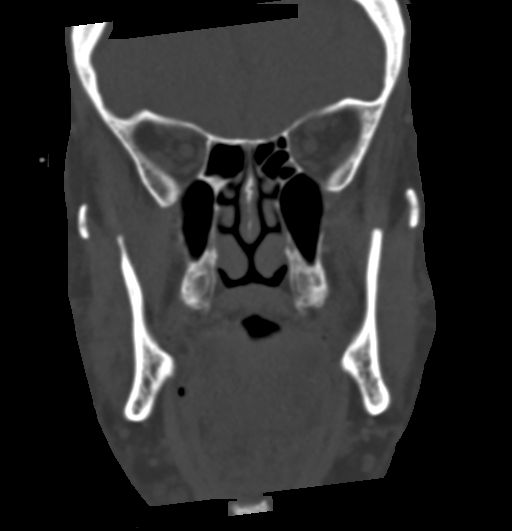
[im 39/70  bone]
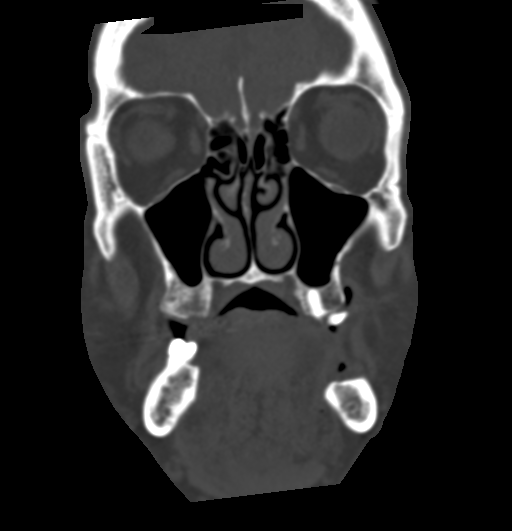

[Series 8: sagittal soft · sagittal · 0.27mm/px · 3 of 94 slices shown]
[im 34/94  bone]
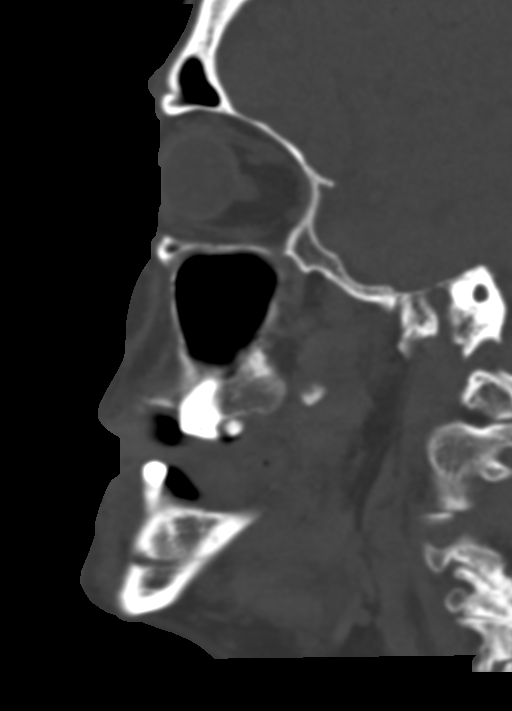
[im 47/94  bone]
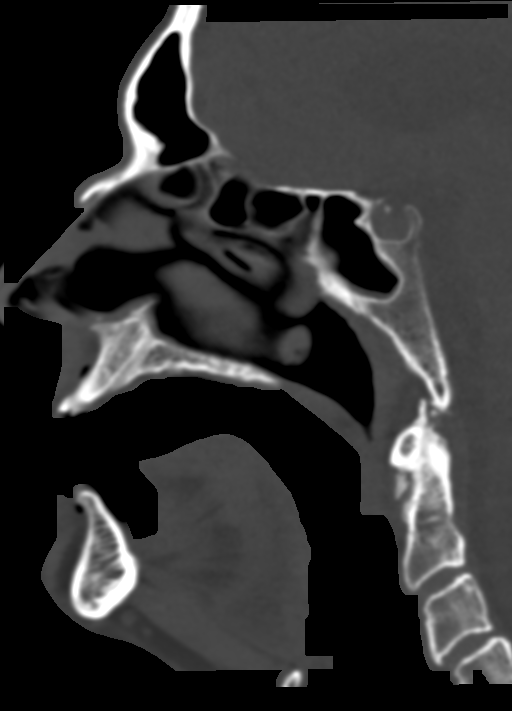
[im 61/94  bone]
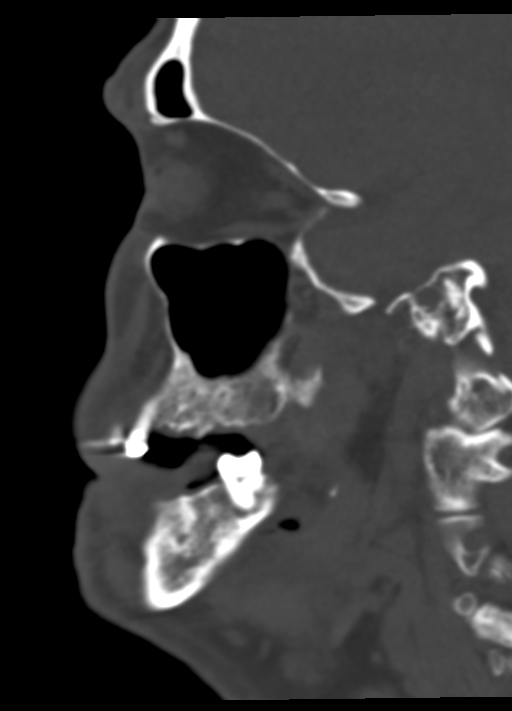

[15 of 47 positions shown; findings below may reference images not displayed]

FINDINGS: CT HEAD FINDINGS

Brain: There is no evidence of acute intracranial hemorrhage,
extra-axial fluid collection, or acute infarct.

There is mild global parenchymal volume loss with prominence of the
ventricular system and extra-axial CSF spaces. Patchy hypodensity
throughout the subcortical and periventricular white matter likely
reflects sequela of chronic white matter microangiopathy.

There is no solid mass lesion.  There is no midline shift.

Vascular: There is calcification of the bilateral cavernous ICAs.

Skull: Normal. Negative for fracture or focal lesion.

Other: None.

CT MAXILLOFACIAL FINDINGS

Osseous: No fracture or mandibular dislocation. No destructive
process.

Orbits: The globes and orbits are unremarkable.

Sinuses: There is mild mucosal thickening in the right sphenoid
sinus.

Soft tissues: Unremarkable.
IMPRESSION: 1. No acute intracranial hemorrhage or calvarial fracture.
2. No acute facial bone fracture.

## 2022-05-12 IMAGING — CT CT CERVICAL SPINE W/O CM
3 of 5 series · 11 of 33 positions shown, 13 images · non-contrast
Comparison: Cervical spine CT 10/12/2020

CLINICAL DATA: Fall, neck trauma



[Series 8: sagittal bone · sagittal · 0.23mm/px · 5 of 86 slices shown, 6 images]
[im 29/86  bone]
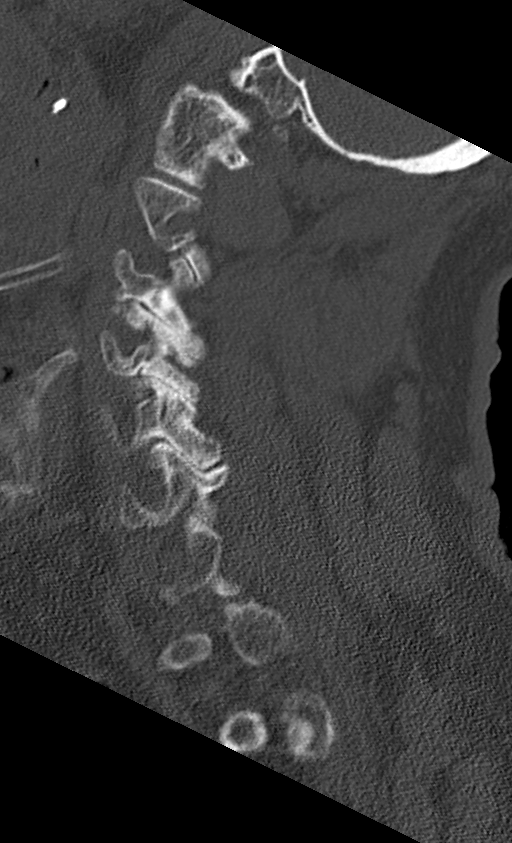
[im 36/86  bone]
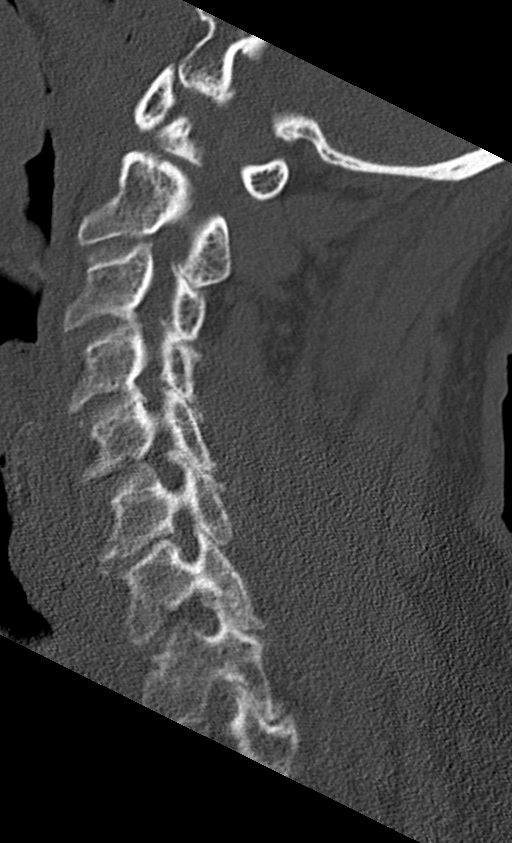
[im 43/86  soft-tissue]
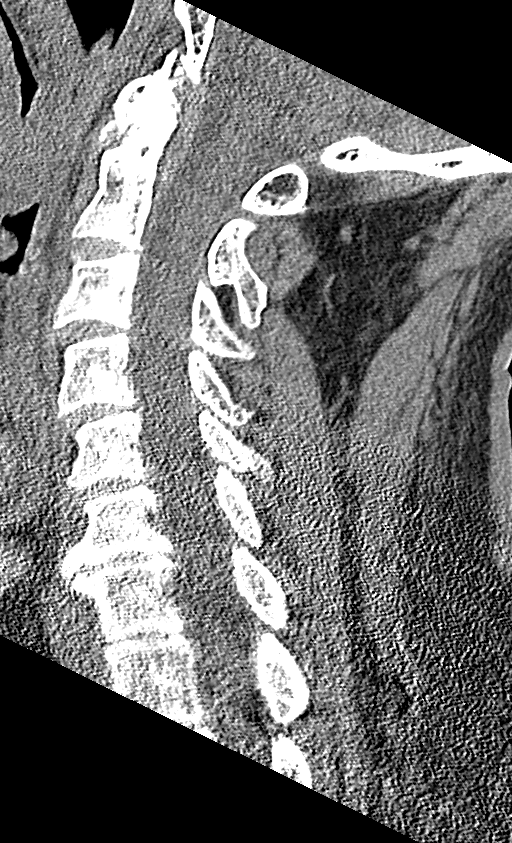
[im 43/86  bone]
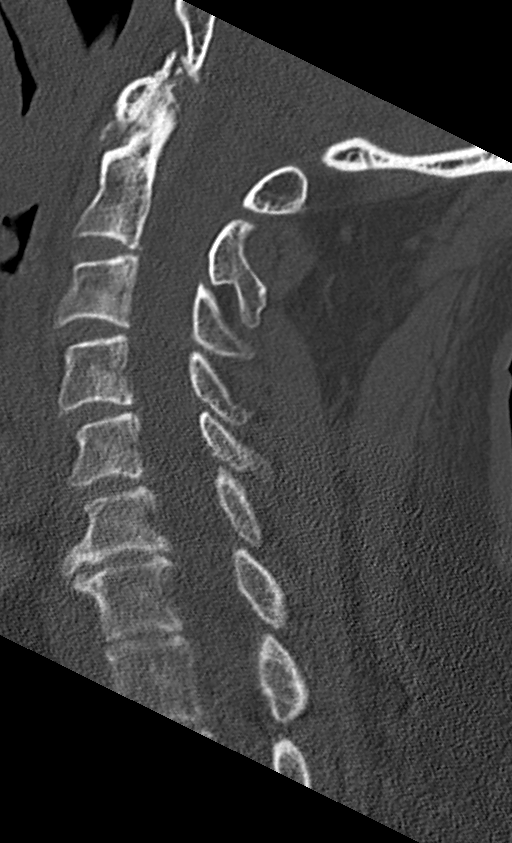
[im 50/86  bone]
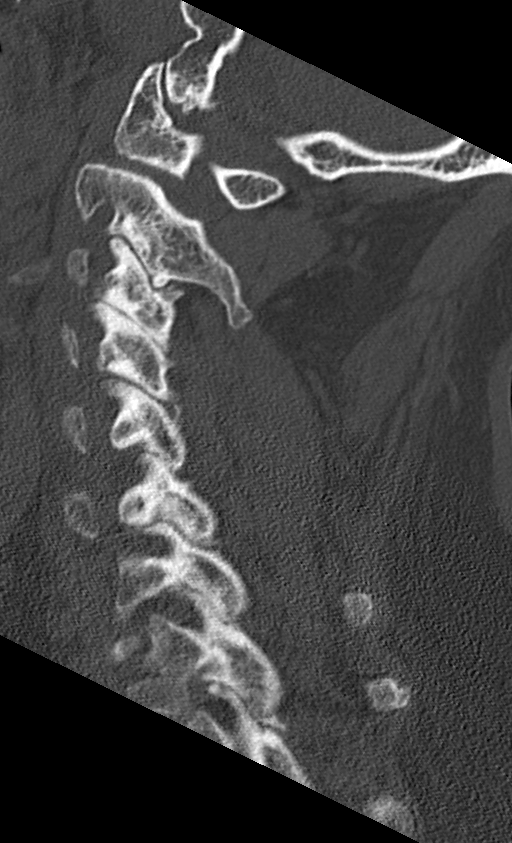
[im 57/86  bone]
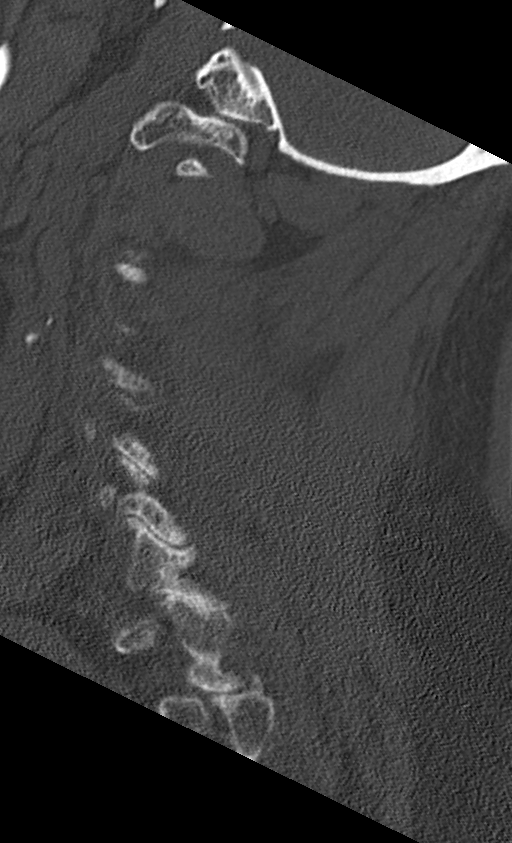

[Series 9: coronal bone · coronal · 0.34mm/px · 3 of 60 slices shown]
[im 14/60  bone]
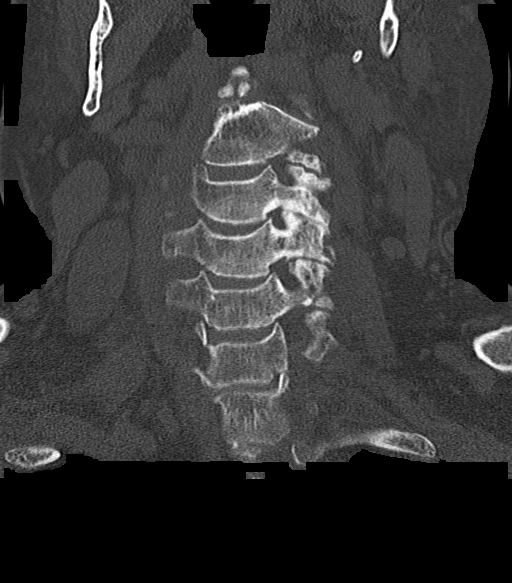
[im 25/60  bone]
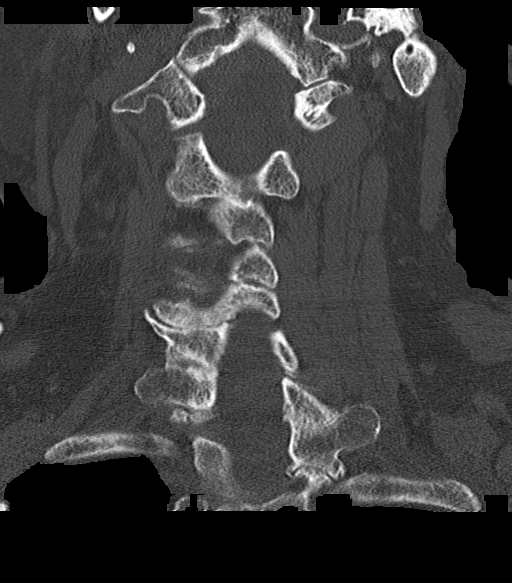
[im 36/60  bone]
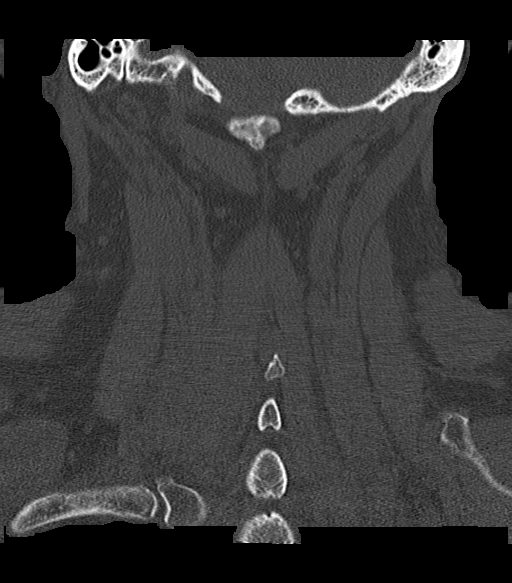

[Series 10: orthogonal axials · axial · 0.23mm/px · z∈[-219,-131]mm · 3 of 99 slices shown, 4 images]
[im 25/99  soft-tissue]
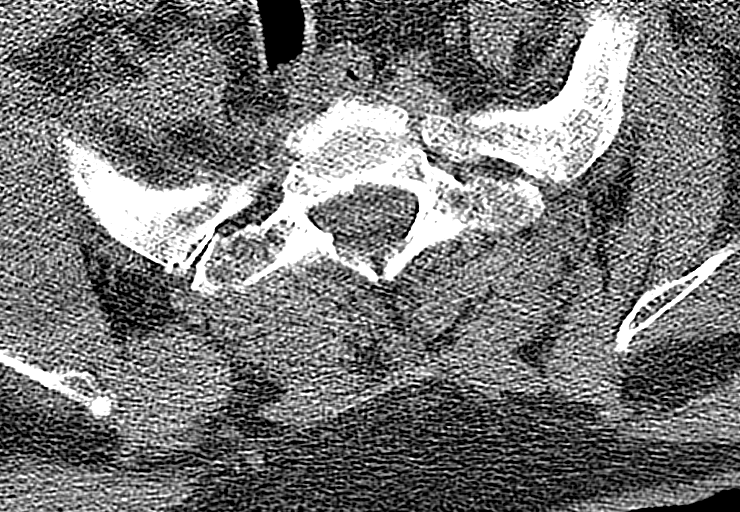
[im 25/99  bone]
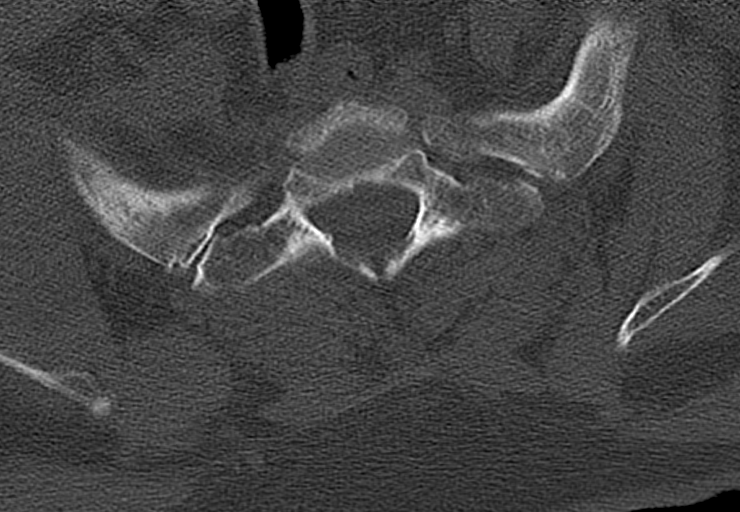
[im 50/99  bone]
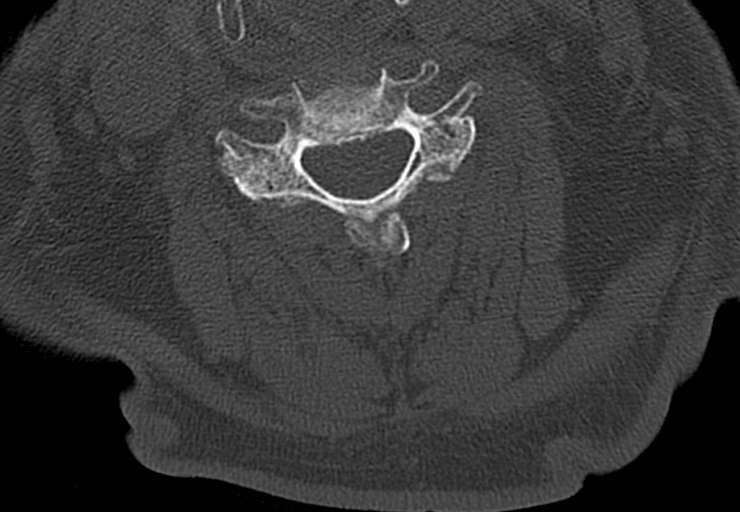
[im 74/99  bone]
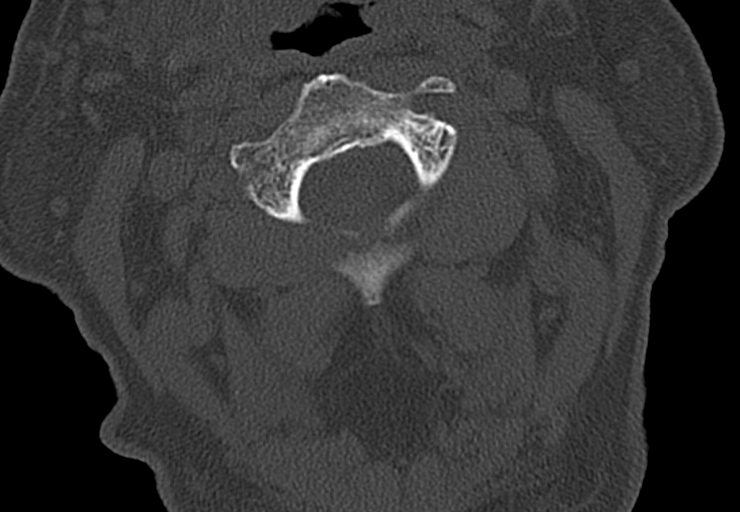

[11 of 33 positions shown; findings below may reference images not displayed]

FINDINGS: Alignment: Normal. There is no antero or retrolisthesis. There is no
jumped or perched facets or other evidence of traumatic
malalignment.

Skull base and vertebrae: Skull base alignment is maintained.
Vertebral body heights are preserved. There is no evidence of acute
fracture.

Soft tissues and spinal canal: No prevertebral fluid or swelling. No
visible canal hematoma.

Disc levels: There is multilevel degenerative endplate change and
facet arthropathy, most advanced at C5-C6 and C6-C7. The osseous
spinal canal is patent.

Upper chest: The imaged lung apices are clear.

Other: None.
IMPRESSION: No acute fracture or traumatic malalignment of the cervical spine.

## 2022-05-12 IMAGING — CT CT HEAD W/O CM
4 series · 16 of 47 positions shown, 18 images · non-contrast
Comparison: CT head 10/12/2020, MR head 10/11/2020

CLINICAL DATA: Fall out of bed, hit right-side of face

EXAM:
CT HEAD WITHOUT CONTRAST
CT MAXILLOFACIAL WITHOUT CONTRAST
TECHNIQUE: Multidetector CT imaging of the head and maxillofacial structures
were performed using the standard protocol without intravenous
contrast. Multiplanar CT image reconstructions of the maxillofacial
structures were also generated.
RADIATION DOSE REDUCTION: This exam was performed according to the
departmental dose-optimization program which includes automated
exposure control, adjustment of the mA and/or kV according to
patient size and/or use of iterative reconstruction technique.

[Series 2: head bone · axial · 0.40mm/px · z∈[-58,-26]mm · 3 of 80 slices shown]
[im 8/80  bone]
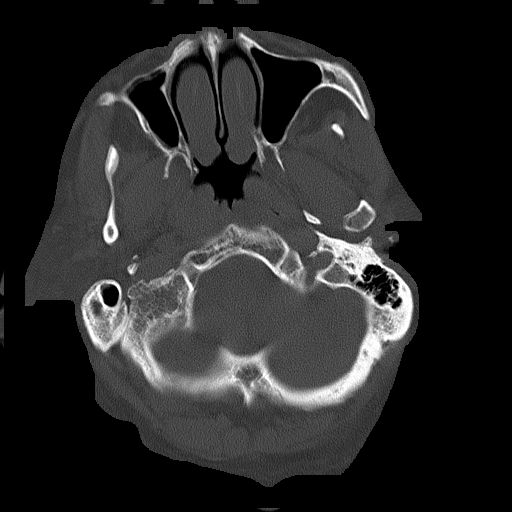
[im 16/80  bone]
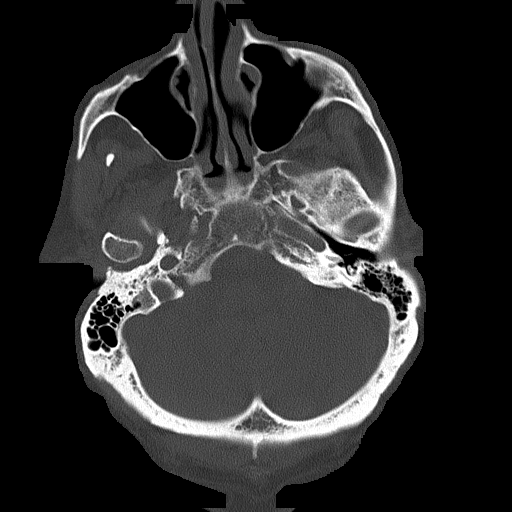
[im 24/80  bone]
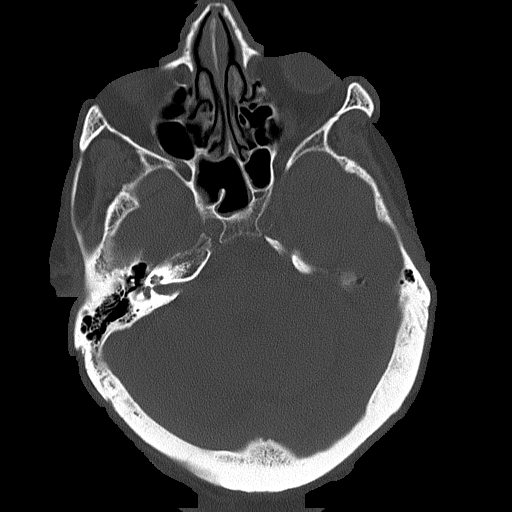

[Series 3: head wo · axial · 0.40mm/px · z∈[-57,+63]mm · 7 of 32 slices shown, 9 images]
[im 4/32  brain]
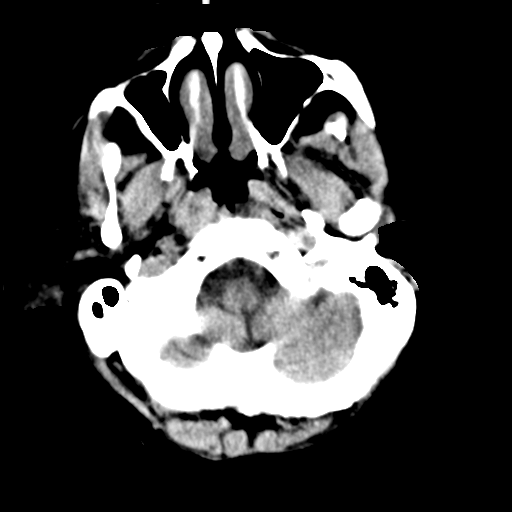
[im 4/32  bone]
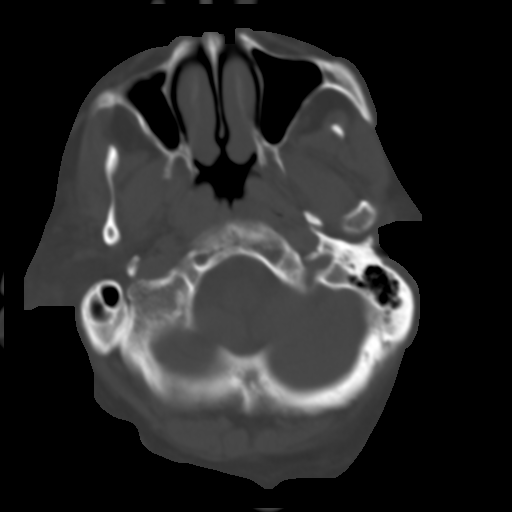
[im 8/32  brain]
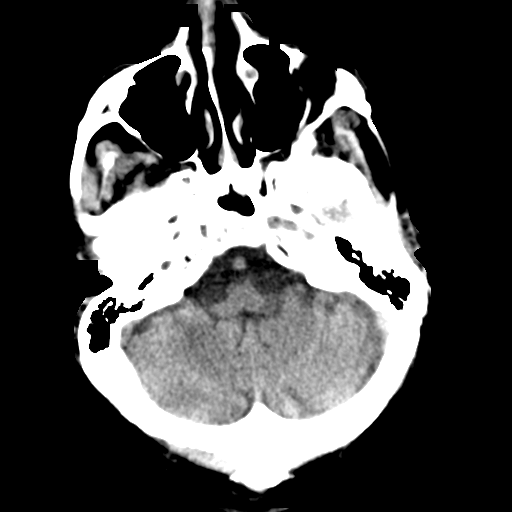
[im 12/32  brain]
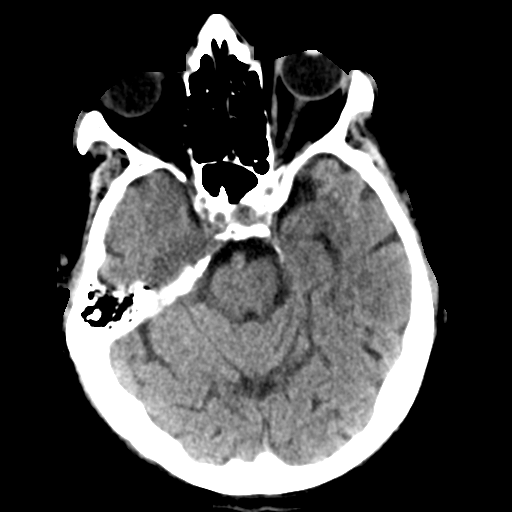
[im 16/32  brain]
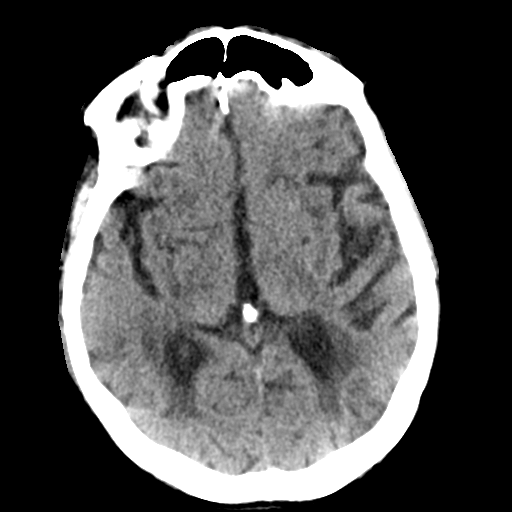
[im 20/32  brain]
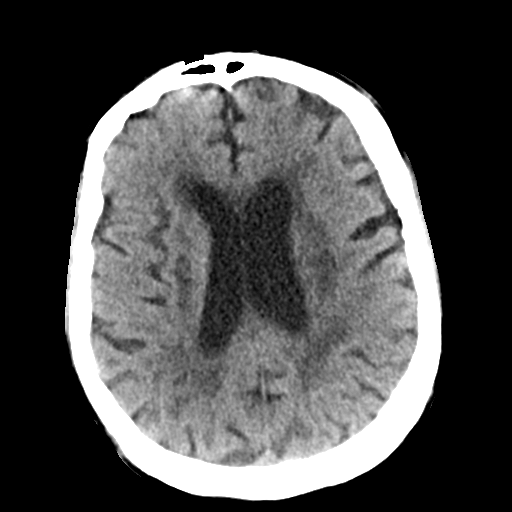
[im 20/32  bone]
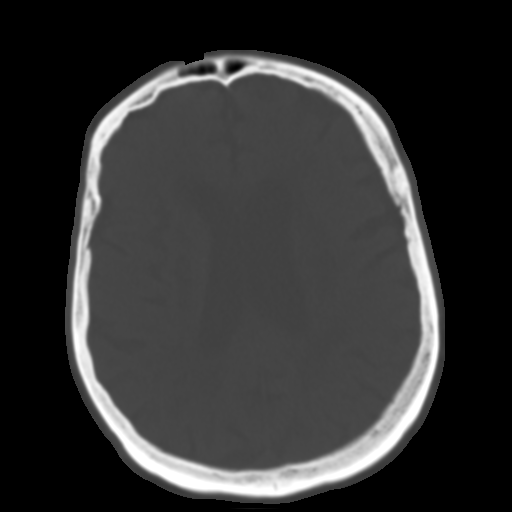
[im 24/32  brain]
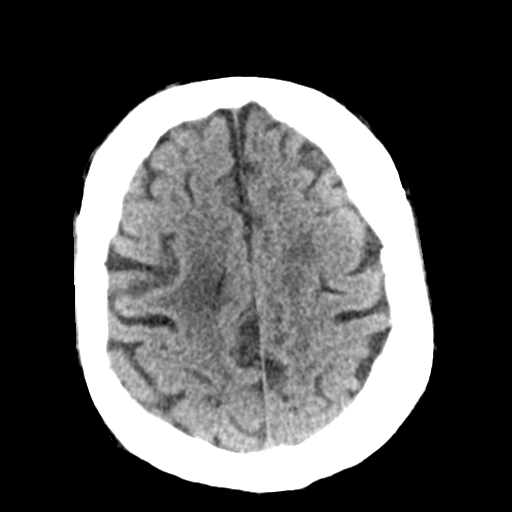
[im 28/32  brain]
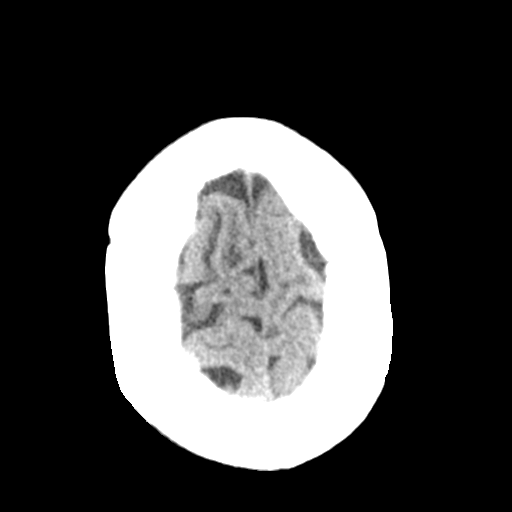

[Series 4: coronal soft tissue · coronal · 0.31mm/px · 3 of 70 slices shown]
[im 24/70  brain]
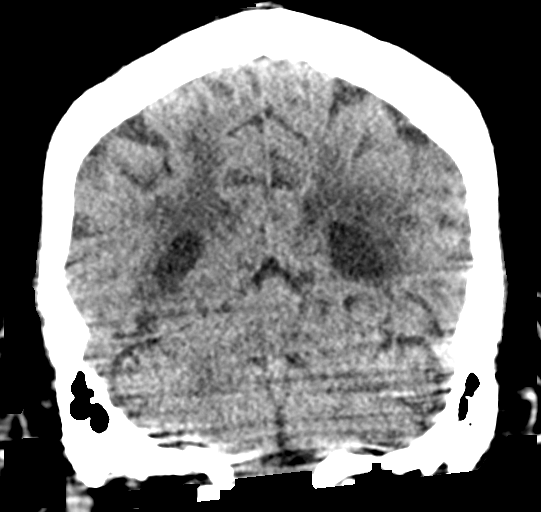
[im 31/70  brain]
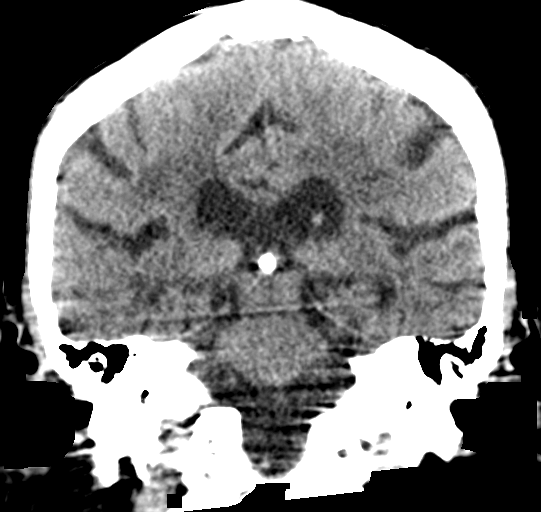
[im 39/70  brain]
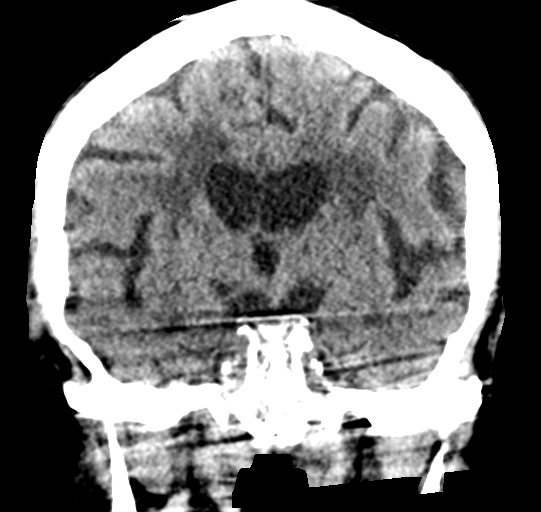

[Series 5: sagittal soft tissue · sagittal · 0.31mm/px · 3 of 57 slices shown]
[im 19/57  brain]
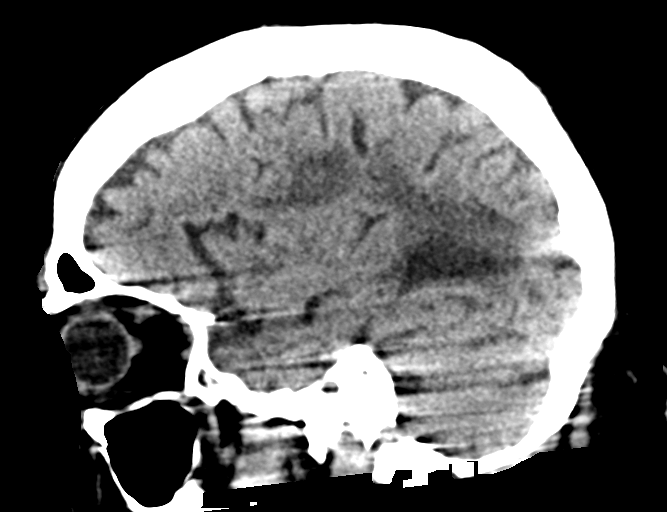
[im 29/57  brain]
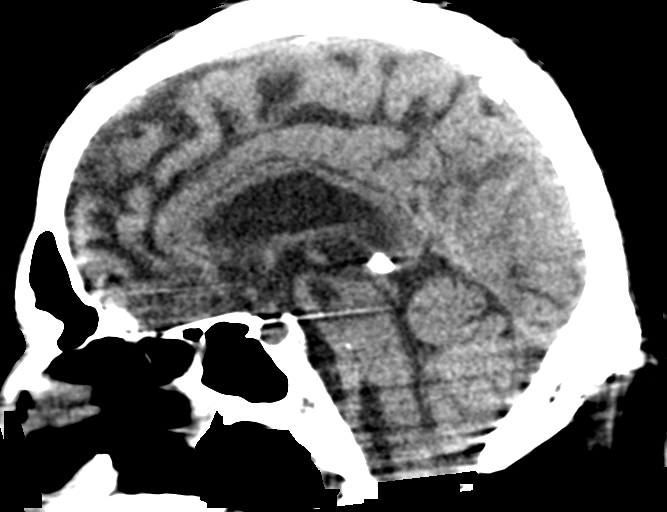
[im 38/57  brain]
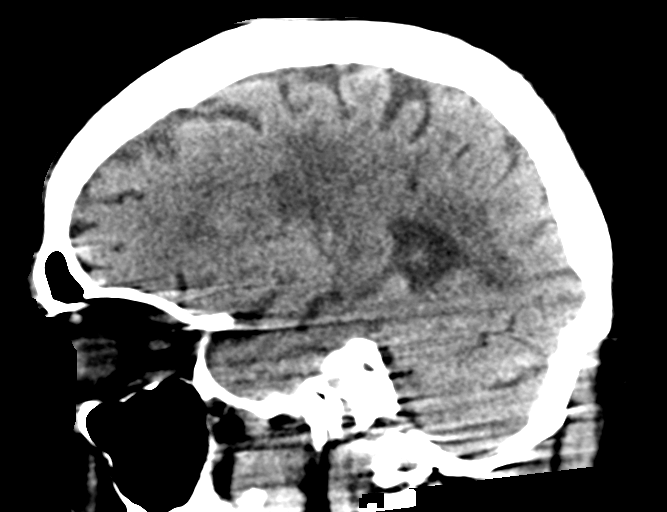

[16 of 47 positions shown; findings below may reference images not displayed]

FINDINGS: CT HEAD FINDINGS

Brain: There is no evidence of acute intracranial hemorrhage,
extra-axial fluid collection, or acute infarct.

There is mild global parenchymal volume loss with prominence of the
ventricular system and extra-axial CSF spaces. Patchy hypodensity
throughout the subcortical and periventricular white matter likely
reflects sequela of chronic white matter microangiopathy.

There is no solid mass lesion.  There is no midline shift.

Vascular: There is calcification of the bilateral cavernous ICAs.

Skull: Normal. Negative for fracture or focal lesion.

Other: None.

CT MAXILLOFACIAL FINDINGS

Osseous: No fracture or mandibular dislocation. No destructive
process.

Orbits: The globes and orbits are unremarkable.

Sinuses: There is mild mucosal thickening in the right sphenoid
sinus.

Soft tissues: Unremarkable.
IMPRESSION: 1. No acute intracranial hemorrhage or calvarial fracture.
2. No acute facial bone fracture.

## 2022-05-13 ENCOUNTER — Encounter: Payer: Self-pay | Admitting: Nurse Practitioner

## 2022-05-13 ENCOUNTER — Non-Acute Institutional Stay: Payer: Medicare HMO | Admitting: Nurse Practitioner

## 2022-05-13 VITALS — BP 132/68 | HR 75 | Temp 97.5°F | Resp 18 | Wt 236.9 lb

## 2022-05-13 DIAGNOSIS — F0390 Unspecified dementia without behavioral disturbance: Secondary | ICD-10-CM | POA: Diagnosis not present

## 2022-05-13 DIAGNOSIS — Z515 Encounter for palliative care: Secondary | ICD-10-CM | POA: Diagnosis not present

## 2022-05-13 DIAGNOSIS — I63111 Cerebral infarction due to embolism of right vertebral artery: Secondary | ICD-10-CM

## 2022-05-13 DIAGNOSIS — M6281 Muscle weakness (generalized): Secondary | ICD-10-CM | POA: Diagnosis not present

## 2022-05-13 DIAGNOSIS — R5381 Other malaise: Secondary | ICD-10-CM | POA: Diagnosis not present

## 2022-05-13 DIAGNOSIS — R41841 Cognitive communication deficit: Secondary | ICD-10-CM | POA: Diagnosis not present

## 2022-05-13 DIAGNOSIS — J690 Pneumonitis due to inhalation of food and vomit: Secondary | ICD-10-CM | POA: Diagnosis not present

## 2022-05-13 DIAGNOSIS — R1312 Dysphagia, oropharyngeal phase: Secondary | ICD-10-CM | POA: Diagnosis not present

## 2022-05-13 NOTE — Progress Notes (Signed)
Therapist, nutritional Palliative Care Consult Note Telephone: [phone]  Fax: [phone]    Date of encounter: 05/13/22 12:09 PM PATIENT NAME: Keith Carter [facility] [address] [place] [address]   [phone] (home)  DOB: 29-Dec-1955 MRN: [id number] PRIMARY CARE PROVIDER:    [facility]  RESPONSIBLE PARTY:    Contact Information     Name Relation Home Work Mobile   Keith Carter Daughter [phone]  [phone]   Keith Carter, Keith Carter [phone]     Keith Carter,Keith Carter Son   [phone]        I met face to face with patient and family in facility. Palliative Care was asked to follow this patient by consultation request of  Keith Carter  to address advance care planning and complex medical decision making. This is a follow up visit.                                   ASSESSMENT AND PLAN / RECOMMENDATIONS:  Symptom Management/Plan: 1. Advance Care Planning; DNR; MOST form 2. Palliative care encounter; Palliative care encounter; Palliative medicine team will continue to support patient, patient's family, and medical team. Visit consisted of counseling and education dealing with the complex and emotionally intense issues of symptom management and palliative care in the setting of serious and potentially life-threatening illness   3. Anorexia; Debility secondary to CVA: dementia; aphasia, progressive, continue to monitor; encourage socialization, oob, he resides at [facility] in the same room as his wife. Overall stable. Continue to monitor for fall risk.    4. Weight gain, reviewed weights, lost less than 1 lb. continue to monitor weight; discussed nutrition, daily intake   10/07/2021 weight 236.3 lbs 11/04/2021 weight 235.4 lbs 01/05/2022 weight 240 lbs 03/22/2022 weight 233 lbs  5. f/u 2 to 8 weeks for ongoing monitoring chronic disease progression, ongoing discussions complex medical decision making    Follow up Palliative Care Visit: PC f/u visit further discussion monitor trends of appetite, weights, monitor for functional, cognitive decline with chronic disease progression, assess any active symptoms, supportive role. Palliative care will continue to follow for complex medical decision making, advance care planning, and clarification of goals.    I spent 45 minutes providing this consultation. More than 50% of the time in this consultation was spent in counseling and care coordination.   PPS: 40%   Chief Complaint: Follow up palliative consult for complex medical decision making   HISTORY OF PRESENT ILLNESS:  Keith Carter is a 67 y.o. year old male  with multiple medical problems including PE/dementia with delirium, left frontal stroke with residual aphasia, FTT, BPH, HTN, DM, TIA. Keith Carter is followed by chronic pain with Dr Shauna Hugh. Keith Carter resides at LTC at [facility] with his wife as his roommate. Keith Carter requires assistance with transfers, mobility to w/c where he is mobile propeling with his feet around the facility. Requires assistance with bathing, dressing. No recent falls, wounds, hospitalizations, infections per staff.    I reviewed available labs, medications, imaging, studies and related documents from the EMR.  Records reviewed and summarized above.  Physical Exam: General: obese, debilitated pleasant male, pleasant EYES:  lids intact ENMT: oral mucous membranes moist CV: S1S2, RRR Pulmonary: LCTA MSK: w/c dependent, Skin: warm and dry Neuro:  + generalized weakness,  + cognitive impairment, aphasia improving Psych: non-anxious affect, Alert, oriented   Thank you  for the opportunity to participate in the care of Keith Carter. Please call our office at [phone] if we can be of additional assistance.   Keith Carter Keith Rome, NP

## 2022-05-16 DIAGNOSIS — R1312 Dysphagia, oropharyngeal phase: Secondary | ICD-10-CM | POA: Diagnosis not present

## 2022-05-16 DIAGNOSIS — R41841 Cognitive communication deficit: Secondary | ICD-10-CM | POA: Diagnosis not present

## 2022-05-16 DIAGNOSIS — M6281 Muscle weakness (generalized): Secondary | ICD-10-CM | POA: Diagnosis not present

## 2022-05-16 DIAGNOSIS — J690 Pneumonitis due to inhalation of food and vomit: Secondary | ICD-10-CM | POA: Diagnosis not present

## 2022-05-16 DIAGNOSIS — G894 Chronic pain syndrome: Secondary | ICD-10-CM | POA: Diagnosis not present

## 2022-05-16 DIAGNOSIS — F331 Major depressive disorder, recurrent, moderate: Secondary | ICD-10-CM | POA: Diagnosis not present

## 2022-05-16 DIAGNOSIS — I10 Essential (primary) hypertension: Secondary | ICD-10-CM | POA: Diagnosis not present

## 2022-05-16 DIAGNOSIS — F0280 Dementia in other diseases classified elsewhere without behavioral disturbance: Secondary | ICD-10-CM | POA: Diagnosis not present

## 2022-05-16 DIAGNOSIS — F419 Anxiety disorder, unspecified: Secondary | ICD-10-CM | POA: Diagnosis not present

## 2022-05-16 DIAGNOSIS — G309 Alzheimer's disease, unspecified: Secondary | ICD-10-CM | POA: Diagnosis not present

## 2022-05-17 DIAGNOSIS — I10 Essential (primary) hypertension: Secondary | ICD-10-CM | POA: Diagnosis not present

## 2022-05-17 DIAGNOSIS — R41841 Cognitive communication deficit: Secondary | ICD-10-CM | POA: Diagnosis not present

## 2022-05-17 DIAGNOSIS — M6281 Muscle weakness (generalized): Secondary | ICD-10-CM | POA: Diagnosis not present

## 2022-05-17 DIAGNOSIS — J690 Pneumonitis due to inhalation of food and vomit: Secondary | ICD-10-CM | POA: Diagnosis not present

## 2022-05-17 DIAGNOSIS — R1312 Dysphagia, oropharyngeal phase: Secondary | ICD-10-CM | POA: Diagnosis not present

## 2022-05-18 DIAGNOSIS — I10 Essential (primary) hypertension: Secondary | ICD-10-CM | POA: Diagnosis not present

## 2022-05-19 DIAGNOSIS — R41841 Cognitive communication deficit: Secondary | ICD-10-CM | POA: Diagnosis not present

## 2022-05-19 DIAGNOSIS — R1312 Dysphagia, oropharyngeal phase: Secondary | ICD-10-CM | POA: Diagnosis not present

## 2022-05-19 DIAGNOSIS — M6281 Muscle weakness (generalized): Secondary | ICD-10-CM | POA: Diagnosis not present

## 2022-05-19 DIAGNOSIS — J690 Pneumonitis due to inhalation of food and vomit: Secondary | ICD-10-CM | POA: Diagnosis not present

## 2022-05-25 DIAGNOSIS — F331 Major depressive disorder, recurrent, moderate: Secondary | ICD-10-CM | POA: Diagnosis not present

## 2022-05-25 DIAGNOSIS — F419 Anxiety disorder, unspecified: Secondary | ICD-10-CM | POA: Diagnosis not present

## 2022-05-26 DIAGNOSIS — M545 Low back pain, unspecified: Secondary | ICD-10-CM | POA: Diagnosis not present

## 2022-05-31 DIAGNOSIS — R609 Edema, unspecified: Secondary | ICD-10-CM | POA: Diagnosis not present

## 2022-06-01 DIAGNOSIS — I10 Essential (primary) hypertension: Secondary | ICD-10-CM | POA: Diagnosis not present

## 2022-06-01 DIAGNOSIS — R609 Edema, unspecified: Secondary | ICD-10-CM | POA: Diagnosis not present

## 2022-06-02 DIAGNOSIS — F32A Depression, unspecified: Secondary | ICD-10-CM | POA: Diagnosis not present

## 2022-06-03 DIAGNOSIS — R609 Edema, unspecified: Secondary | ICD-10-CM | POA: Diagnosis not present

## 2022-06-07 DIAGNOSIS — F419 Anxiety disorder, unspecified: Secondary | ICD-10-CM | POA: Diagnosis not present

## 2022-06-08 DIAGNOSIS — F331 Major depressive disorder, recurrent, moderate: Secondary | ICD-10-CM | POA: Diagnosis not present

## 2022-06-08 DIAGNOSIS — F419 Anxiety disorder, unspecified: Secondary | ICD-10-CM | POA: Diagnosis not present

## 2022-06-09 DIAGNOSIS — E119 Type 2 diabetes mellitus without complications: Secondary | ICD-10-CM | POA: Diagnosis not present

## 2022-06-10 DIAGNOSIS — R609 Edema, unspecified: Secondary | ICD-10-CM | POA: Diagnosis not present

## 2022-06-13 DIAGNOSIS — G309 Alzheimer's disease, unspecified: Secondary | ICD-10-CM | POA: Diagnosis not present

## 2022-06-13 DIAGNOSIS — G4700 Insomnia, unspecified: Secondary | ICD-10-CM | POA: Diagnosis not present

## 2022-06-13 DIAGNOSIS — G8929 Other chronic pain: Secondary | ICD-10-CM | POA: Diagnosis not present

## 2022-06-13 DIAGNOSIS — F411 Generalized anxiety disorder: Secondary | ICD-10-CM | POA: Diagnosis not present

## 2022-06-13 DIAGNOSIS — F339 Major depressive disorder, recurrent, unspecified: Secondary | ICD-10-CM | POA: Diagnosis not present

## 2022-06-17 DIAGNOSIS — I739 Peripheral vascular disease, unspecified: Secondary | ICD-10-CM | POA: Diagnosis not present

## 2022-06-17 DIAGNOSIS — I10 Essential (primary) hypertension: Secondary | ICD-10-CM | POA: Diagnosis not present

## 2022-06-17 DIAGNOSIS — N400 Enlarged prostate without lower urinary tract symptoms: Secondary | ICD-10-CM | POA: Diagnosis not present

## 2022-06-17 DIAGNOSIS — B351 Tinea unguium: Secondary | ICD-10-CM | POA: Diagnosis not present

## 2022-06-17 DIAGNOSIS — M25511 Pain in right shoulder: Secondary | ICD-10-CM | POA: Diagnosis not present

## 2022-06-20 DIAGNOSIS — F411 Generalized anxiety disorder: Secondary | ICD-10-CM | POA: Diagnosis not present

## 2022-06-20 DIAGNOSIS — F339 Major depressive disorder, recurrent, unspecified: Secondary | ICD-10-CM | POA: Diagnosis not present

## 2022-06-22 DIAGNOSIS — I10 Essential (primary) hypertension: Secondary | ICD-10-CM | POA: Diagnosis not present

## 2022-06-24 DIAGNOSIS — F0280 Dementia in other diseases classified elsewhere without behavioral disturbance: Secondary | ICD-10-CM | POA: Diagnosis not present

## 2022-06-24 DIAGNOSIS — F339 Major depressive disorder, recurrent, unspecified: Secondary | ICD-10-CM | POA: Diagnosis not present

## 2022-06-24 DIAGNOSIS — F03918 Unspecified dementia, unspecified severity, with other behavioral disturbance: Secondary | ICD-10-CM | POA: Diagnosis not present

## 2022-06-24 DIAGNOSIS — E785 Hyperlipidemia, unspecified: Secondary | ICD-10-CM | POA: Diagnosis not present

## 2022-06-24 DIAGNOSIS — M6281 Muscle weakness (generalized): Secondary | ICD-10-CM | POA: Diagnosis not present

## 2022-06-26 DIAGNOSIS — M6281 Muscle weakness (generalized): Secondary | ICD-10-CM | POA: Diagnosis not present

## 2022-06-26 DIAGNOSIS — F03918 Unspecified dementia, unspecified severity, with other behavioral disturbance: Secondary | ICD-10-CM | POA: Diagnosis not present

## 2022-06-27 DIAGNOSIS — F03918 Unspecified dementia, unspecified severity, with other behavioral disturbance: Secondary | ICD-10-CM | POA: Diagnosis not present

## 2022-06-27 DIAGNOSIS — M6281 Muscle weakness (generalized): Secondary | ICD-10-CM | POA: Diagnosis not present

## 2022-06-28 DIAGNOSIS — M6281 Muscle weakness (generalized): Secondary | ICD-10-CM | POA: Diagnosis not present

## 2022-06-28 DIAGNOSIS — F03918 Unspecified dementia, unspecified severity, with other behavioral disturbance: Secondary | ICD-10-CM | POA: Diagnosis not present

## 2022-06-29 DIAGNOSIS — F03918 Unspecified dementia, unspecified severity, with other behavioral disturbance: Secondary | ICD-10-CM | POA: Diagnosis not present

## 2022-06-29 DIAGNOSIS — M6281 Muscle weakness (generalized): Secondary | ICD-10-CM | POA: Diagnosis not present

## 2022-06-30 DIAGNOSIS — F03918 Unspecified dementia, unspecified severity, with other behavioral disturbance: Secondary | ICD-10-CM | POA: Diagnosis not present

## 2022-06-30 DIAGNOSIS — M6281 Muscle weakness (generalized): Secondary | ICD-10-CM | POA: Diagnosis not present

## 2022-07-04 DIAGNOSIS — M6281 Muscle weakness (generalized): Secondary | ICD-10-CM | POA: Diagnosis not present

## 2022-07-04 DIAGNOSIS — G8929 Other chronic pain: Secondary | ICD-10-CM | POA: Diagnosis not present

## 2022-07-04 DIAGNOSIS — F03918 Unspecified dementia, unspecified severity, with other behavioral disturbance: Secondary | ICD-10-CM | POA: Diagnosis not present

## 2022-07-05 DIAGNOSIS — F03918 Unspecified dementia, unspecified severity, with other behavioral disturbance: Secondary | ICD-10-CM | POA: Diagnosis not present

## 2022-07-05 DIAGNOSIS — M6281 Muscle weakness (generalized): Secondary | ICD-10-CM | POA: Diagnosis not present

## 2022-07-06 DIAGNOSIS — M6281 Muscle weakness (generalized): Secondary | ICD-10-CM | POA: Diagnosis not present

## 2022-07-06 DIAGNOSIS — F03918 Unspecified dementia, unspecified severity, with other behavioral disturbance: Secondary | ICD-10-CM | POA: Diagnosis not present

## 2022-07-06 DIAGNOSIS — F411 Generalized anxiety disorder: Secondary | ICD-10-CM | POA: Diagnosis not present

## 2022-07-06 DIAGNOSIS — F339 Major depressive disorder, recurrent, unspecified: Secondary | ICD-10-CM | POA: Diagnosis not present

## 2022-07-07 DIAGNOSIS — F03918 Unspecified dementia, unspecified severity, with other behavioral disturbance: Secondary | ICD-10-CM | POA: Diagnosis not present

## 2022-07-07 DIAGNOSIS — M6281 Muscle weakness (generalized): Secondary | ICD-10-CM | POA: Diagnosis not present

## 2022-07-08 DIAGNOSIS — M6281 Muscle weakness (generalized): Secondary | ICD-10-CM | POA: Diagnosis not present

## 2022-07-08 DIAGNOSIS — E119 Type 2 diabetes mellitus without complications: Secondary | ICD-10-CM | POA: Diagnosis not present

## 2022-07-08 DIAGNOSIS — F03918 Unspecified dementia, unspecified severity, with other behavioral disturbance: Secondary | ICD-10-CM | POA: Diagnosis not present

## 2022-07-11 DIAGNOSIS — M6281 Muscle weakness (generalized): Secondary | ICD-10-CM | POA: Diagnosis not present

## 2022-07-11 DIAGNOSIS — F03918 Unspecified dementia, unspecified severity, with other behavioral disturbance: Secondary | ICD-10-CM | POA: Diagnosis not present

## 2022-07-12 DIAGNOSIS — F03918 Unspecified dementia, unspecified severity, with other behavioral disturbance: Secondary | ICD-10-CM | POA: Diagnosis not present

## 2022-07-12 DIAGNOSIS — M6281 Muscle weakness (generalized): Secondary | ICD-10-CM | POA: Diagnosis not present

## 2022-07-13 DIAGNOSIS — F03918 Unspecified dementia, unspecified severity, with other behavioral disturbance: Secondary | ICD-10-CM | POA: Diagnosis not present

## 2022-07-13 DIAGNOSIS — M6281 Muscle weakness (generalized): Secondary | ICD-10-CM | POA: Diagnosis not present

## 2022-07-14 DIAGNOSIS — F03918 Unspecified dementia, unspecified severity, with other behavioral disturbance: Secondary | ICD-10-CM | POA: Diagnosis not present

## 2022-07-14 DIAGNOSIS — M6281 Muscle weakness (generalized): Secondary | ICD-10-CM | POA: Diagnosis not present

## 2022-07-15 DIAGNOSIS — M6281 Muscle weakness (generalized): Secondary | ICD-10-CM | POA: Diagnosis not present

## 2022-07-15 DIAGNOSIS — F03918 Unspecified dementia, unspecified severity, with other behavioral disturbance: Secondary | ICD-10-CM | POA: Diagnosis not present

## 2022-07-18 DIAGNOSIS — F03918 Unspecified dementia, unspecified severity, with other behavioral disturbance: Secondary | ICD-10-CM | POA: Diagnosis not present

## 2022-07-18 DIAGNOSIS — M6281 Muscle weakness (generalized): Secondary | ICD-10-CM | POA: Diagnosis not present

## 2022-07-19 DIAGNOSIS — F03918 Unspecified dementia, unspecified severity, with other behavioral disturbance: Secondary | ICD-10-CM | POA: Diagnosis not present

## 2022-07-19 DIAGNOSIS — M6281 Muscle weakness (generalized): Secondary | ICD-10-CM | POA: Diagnosis not present

## 2022-07-20 DIAGNOSIS — E785 Hyperlipidemia, unspecified: Secondary | ICD-10-CM | POA: Diagnosis not present

## 2022-07-20 DIAGNOSIS — F411 Generalized anxiety disorder: Secondary | ICD-10-CM | POA: Diagnosis not present

## 2022-07-20 DIAGNOSIS — F03918 Unspecified dementia, unspecified severity, with other behavioral disturbance: Secondary | ICD-10-CM | POA: Diagnosis not present

## 2022-07-20 DIAGNOSIS — M6281 Muscle weakness (generalized): Secondary | ICD-10-CM | POA: Diagnosis not present

## 2022-07-20 DIAGNOSIS — F339 Major depressive disorder, recurrent, unspecified: Secondary | ICD-10-CM | POA: Diagnosis not present

## 2022-07-20 DIAGNOSIS — F0280 Dementia in other diseases classified elsewhere without behavioral disturbance: Secondary | ICD-10-CM | POA: Diagnosis not present

## 2022-07-21 ENCOUNTER — Non-Acute Institutional Stay: Payer: Medicare HMO | Admitting: Nurse Practitioner

## 2022-07-21 ENCOUNTER — Encounter: Payer: Self-pay | Admitting: Nurse Practitioner

## 2022-07-21 VITALS — BP 127/78 | HR 83 | Temp 97.3°F | Resp 18 | Wt 243.3 lb

## 2022-07-21 DIAGNOSIS — R5381 Other malaise: Secondary | ICD-10-CM | POA: Diagnosis not present

## 2022-07-21 DIAGNOSIS — I63111 Cerebral infarction due to embolism of right vertebral artery: Secondary | ICD-10-CM | POA: Diagnosis not present

## 2022-07-21 DIAGNOSIS — F03918 Unspecified dementia, unspecified severity, with other behavioral disturbance: Secondary | ICD-10-CM | POA: Diagnosis not present

## 2022-07-21 DIAGNOSIS — M6281 Muscle weakness (generalized): Secondary | ICD-10-CM | POA: Diagnosis not present

## 2022-07-21 DIAGNOSIS — Z515 Encounter for palliative care: Secondary | ICD-10-CM

## 2022-07-21 DIAGNOSIS — F0390 Unspecified dementia without behavioral disturbance: Secondary | ICD-10-CM

## 2022-07-21 NOTE — Progress Notes (Addendum)
Therapist, nutritional Palliative Care Consult Note Telephone: [phone]  Fax: [phone]    Date of encounter: 07/21/22 5:07 PM PATIENT NAME: Keith Carter [facility] [address] [place] [address]   [phone] (home)  DOB: Feb 20, 1955 MRN: [id number] PRIMARY CARE PROVIDER:    [facility]  RESPONSIBLE PARTY:    Contact Information     Name Relation Home Work Mobile   Keith Carter Daughter [phone]  [phone]   Carter, Keith [phone]     Keith Carter Son   [phone]         I met face to face with patient and family in facility. Palliative Care was asked to follow this patient by consultation request of  [facility]  to address advance care planning and complex medical decision making. This is a follow up visit.                                   ASSESSMENT AND PLAN / RECOMMENDATIONS:  Symptom Management/Plan: 1. Advance Care Planning; DNR; MOST form 2. Palliative care encounter; Palliative care encounter; Palliative medicine team will continue to support patient, patient's family, and medical team. Visit consisted of counseling and education dealing with the complex and emotionally intense issues of symptom management and palliative care in the setting of serious and potentially life-threatening illness   3. Anorexia; Debility secondary to CVA: dementia; aphasia, progressive, continue to monitor; encourage socialization, oob, he resides at [facility] in the same room as his wife. Overall stable. Continue to monitor for fall risk.    4. Weight gain, reviewed weights, continue to monitor weight; discussed nutrition, daily intake, encourage Keith Carter to go to meals, activities and socialization.   10/07/2021 weight 236.3 lbs 11/04/2021 weight 235.4 lbs 01/05/2022 weight 240 lbs 03/22/2022 weight 233 lbs 05/04/2022 weight 236.9 lbs 07/04/2022 weight 243.3 lbs 5. f/u 2 to 8 weeks for  ongoing monitoring chronic disease progression, ongoing discussions complex medical decision making   Follow up Palliative Care Visit: PC f/u visit further discussion monitor trends of appetite, weights, monitor for functional, cognitive decline with chronic disease progression, assess any active symptoms, supportive role. Palliative care will continue to follow for complex medical decision making, advance care planning, and clarification of goals.    I spent 48 minutes providing this consultation. More than 50% of the time in this consultation was spent in counseling and care coordination.   PPS: 40%   Chief Complaint: Follow up palliative consult for complex medical decision making   HISTORY OF PRESENT ILLNESS:  Keith Carter is a 67 y.o. year old male  with multiple medical problems including PE/dementia with delirium, left frontal stroke with residual aphasia, FTT, BPH, HTN, DM, TIA. Keith. Carter is followed by chronic pain with Dr Shauna Hugh. Keith. Carter resides at LTC at [facility] with his wife as his roommate. Keith. Carter requires assistance with transfers, mobility to w/c where he is mobile propeling with his feet around the facility. Requires assistance with bathing, dressing. No recent falls, wounds, hospitalizations, infections per staff. Purpose of today PC f/u visit further discussion monitor trends of appetite, weights, monitor for functional, cognitive decline with chronic disease progression, assess any active symptoms, supportive role. I visited and observed Keith Carter sitting in the w/c in his room with wife in her w/c as they are roommates. We talked about ros, functional abilities, daily routine  as currently his wife is under hospice services. We talked about coping strategies, appetite, weight gain, nutritional counseling. We talked about no recent hospitalizations or exacerbations. We talked about family dynamics. We talked about and encouraged Keith Carter mobility, activities,  going out of the room, increase in self independence. Currently Keith Carter is stable. Most pc visit supportive, cooperative with assessment. Attempted to contact Keith Carter, daughter for update on pc visit. Medications, poc, goc reviewed. Updated staff. PC f/u visit further discussion monitor trends of appetite, weights, monitor for functional, cognitive decline with chronic disease progression, assess any active symptoms, supportive role.   I reviewed available labs, medications, imaging, studies and related documents from the EMR.  Records reviewed and summarized above.  Physical Exam: General: obese, debilitated pleasant male, pleasant ENMT: oral mucous membranes moist CV: S1S2, RRR Pulmonary: breath sounds clear MSK: w/c dependent, Skin: warm and dry Neuro:  + generalized weakness,  + cognitive impairment, aphasia improving Psych: non-anxious affect, Alert, oriented  Thank you for the opportunity to participate in the care of Keith. Carter. Please call our office at [phone] if we can be of additional assistance.   Lezli Danek [place], NP

## 2022-07-27 DIAGNOSIS — F0280 Dementia in other diseases classified elsewhere without behavioral disturbance: Secondary | ICD-10-CM | POA: Diagnosis not present

## 2022-07-27 DIAGNOSIS — F03918 Unspecified dementia, unspecified severity, with other behavioral disturbance: Secondary | ICD-10-CM | POA: Diagnosis not present

## 2022-07-27 DIAGNOSIS — E785 Hyperlipidemia, unspecified: Secondary | ICD-10-CM | POA: Diagnosis not present

## 2022-07-27 DIAGNOSIS — F339 Major depressive disorder, recurrent, unspecified: Secondary | ICD-10-CM | POA: Diagnosis not present

## 2022-08-01 DIAGNOSIS — F411 Generalized anxiety disorder: Secondary | ICD-10-CM | POA: Diagnosis not present

## 2022-08-01 DIAGNOSIS — F331 Major depressive disorder, recurrent, moderate: Secondary | ICD-10-CM | POA: Diagnosis not present

## 2022-08-01 DIAGNOSIS — F5105 Insomnia due to other mental disorder: Secondary | ICD-10-CM | POA: Diagnosis not present

## 2022-08-01 DIAGNOSIS — G894 Chronic pain syndrome: Secondary | ICD-10-CM | POA: Diagnosis not present

## 2022-08-03 DIAGNOSIS — F411 Generalized anxiety disorder: Secondary | ICD-10-CM | POA: Diagnosis not present

## 2022-08-03 DIAGNOSIS — F331 Major depressive disorder, recurrent, moderate: Secondary | ICD-10-CM | POA: Diagnosis not present

## 2022-08-15 DIAGNOSIS — Z9181 History of falling: Secondary | ICD-10-CM | POA: Diagnosis not present

## 2022-08-15 DIAGNOSIS — N400 Enlarged prostate without lower urinary tract symptoms: Secondary | ICD-10-CM | POA: Diagnosis not present

## 2022-08-15 DIAGNOSIS — F411 Generalized anxiety disorder: Secondary | ICD-10-CM | POA: Diagnosis not present

## 2022-08-15 DIAGNOSIS — G8929 Other chronic pain: Secondary | ICD-10-CM | POA: Diagnosis not present

## 2022-08-15 DIAGNOSIS — F331 Major depressive disorder, recurrent, moderate: Secondary | ICD-10-CM | POA: Diagnosis not present

## 2022-08-15 DIAGNOSIS — N183 Chronic kidney disease, stage 3 unspecified: Secondary | ICD-10-CM | POA: Diagnosis not present

## 2022-08-15 DIAGNOSIS — Z8673 Personal history of transient ischemic attack (TIA), and cerebral infarction without residual deficits: Secondary | ICD-10-CM | POA: Diagnosis not present

## 2022-08-15 DIAGNOSIS — I2699 Other pulmonary embolism without acute cor pulmonale: Secondary | ICD-10-CM | POA: Diagnosis not present

## 2022-08-15 DIAGNOSIS — F32A Depression, unspecified: Secondary | ICD-10-CM | POA: Diagnosis not present

## 2022-08-15 DIAGNOSIS — R4701 Aphasia: Secondary | ICD-10-CM | POA: Diagnosis not present

## 2022-08-15 DIAGNOSIS — F5105 Insomnia due to other mental disorder: Secondary | ICD-10-CM | POA: Diagnosis not present

## 2022-08-15 DIAGNOSIS — E119 Type 2 diabetes mellitus without complications: Secondary | ICD-10-CM | POA: Diagnosis not present

## 2022-08-17 DIAGNOSIS — F411 Generalized anxiety disorder: Secondary | ICD-10-CM | POA: Diagnosis not present

## 2022-08-17 DIAGNOSIS — M549 Dorsalgia, unspecified: Secondary | ICD-10-CM | POA: Diagnosis not present

## 2022-08-17 DIAGNOSIS — F331 Major depressive disorder, recurrent, moderate: Secondary | ICD-10-CM | POA: Diagnosis not present

## 2022-08-23 DIAGNOSIS — E119 Type 2 diabetes mellitus without complications: Secondary | ICD-10-CM | POA: Diagnosis not present

## 2022-08-24 DIAGNOSIS — Z0189 Encounter for other specified special examinations: Secondary | ICD-10-CM | POA: Diagnosis not present

## 2022-08-24 DIAGNOSIS — E119 Type 2 diabetes mellitus without complications: Secondary | ICD-10-CM | POA: Diagnosis not present

## 2022-08-26 DIAGNOSIS — R630 Anorexia: Secondary | ICD-10-CM | POA: Diagnosis not present

## 2022-08-26 DIAGNOSIS — G20B2 Parkinson's disease with dyskinesia, with fluctuations: Secondary | ICD-10-CM | POA: Diagnosis not present

## 2022-08-26 DIAGNOSIS — Z515 Encounter for palliative care: Secondary | ICD-10-CM | POA: Diagnosis not present

## 2022-08-26 DIAGNOSIS — E46 Unspecified protein-calorie malnutrition: Secondary | ICD-10-CM | POA: Diagnosis not present

## 2022-08-26 DIAGNOSIS — R627 Adult failure to thrive: Secondary | ICD-10-CM | POA: Diagnosis not present

## 2022-08-26 DIAGNOSIS — F03918 Unspecified dementia, unspecified severity, with other behavioral disturbance: Secondary | ICD-10-CM | POA: Diagnosis not present

## 2022-08-26 DIAGNOSIS — I6930 Unspecified sequelae of cerebral infarction: Secondary | ICD-10-CM | POA: Diagnosis not present

## 2022-08-26 DIAGNOSIS — R1312 Dysphagia, oropharyngeal phase: Secondary | ICD-10-CM | POA: Diagnosis not present

## 2022-08-31 DIAGNOSIS — G894 Chronic pain syndrome: Secondary | ICD-10-CM | POA: Diagnosis not present

## 2022-08-31 DIAGNOSIS — F5105 Insomnia due to other mental disorder: Secondary | ICD-10-CM | POA: Diagnosis not present

## 2022-08-31 DIAGNOSIS — F411 Generalized anxiety disorder: Secondary | ICD-10-CM | POA: Diagnosis not present

## 2022-08-31 DIAGNOSIS — F331 Major depressive disorder, recurrent, moderate: Secondary | ICD-10-CM | POA: Diagnosis not present

## 2022-09-13 DIAGNOSIS — R52 Pain, unspecified: Secondary | ICD-10-CM | POA: Diagnosis not present

## 2022-09-13 DIAGNOSIS — I2699 Other pulmonary embolism without acute cor pulmonale: Secondary | ICD-10-CM | POA: Diagnosis not present

## 2022-09-13 DIAGNOSIS — E785 Hyperlipidemia, unspecified: Secondary | ICD-10-CM | POA: Diagnosis not present

## 2022-09-13 DIAGNOSIS — F339 Major depressive disorder, recurrent, unspecified: Secondary | ICD-10-CM | POA: Diagnosis not present

## 2022-09-13 DIAGNOSIS — Z8673 Personal history of transient ischemic attack (TIA), and cerebral infarction without residual deficits: Secondary | ICD-10-CM | POA: Diagnosis not present

## 2022-09-13 DIAGNOSIS — E119 Type 2 diabetes mellitus without complications: Secondary | ICD-10-CM | POA: Diagnosis not present

## 2022-09-13 DIAGNOSIS — N400 Enlarged prostate without lower urinary tract symptoms: Secondary | ICD-10-CM | POA: Diagnosis not present

## 2022-09-13 DIAGNOSIS — I10 Essential (primary) hypertension: Secondary | ICD-10-CM | POA: Diagnosis not present

## 2022-09-14 DIAGNOSIS — F339 Major depressive disorder, recurrent, unspecified: Secondary | ICD-10-CM | POA: Diagnosis not present

## 2022-09-14 DIAGNOSIS — E785 Hyperlipidemia, unspecified: Secondary | ICD-10-CM | POA: Diagnosis not present

## 2022-09-14 DIAGNOSIS — F0280 Dementia in other diseases classified elsewhere without behavioral disturbance: Secondary | ICD-10-CM | POA: Diagnosis not present

## 2022-09-14 DIAGNOSIS — F03918 Unspecified dementia, unspecified severity, with other behavioral disturbance: Secondary | ICD-10-CM | POA: Diagnosis not present

## 2022-09-21 DIAGNOSIS — F411 Generalized anxiety disorder: Secondary | ICD-10-CM | POA: Diagnosis not present

## 2022-09-21 DIAGNOSIS — F331 Major depressive disorder, recurrent, moderate: Secondary | ICD-10-CM | POA: Diagnosis not present

## 2022-09-26 DIAGNOSIS — F5105 Insomnia due to other mental disorder: Secondary | ICD-10-CM | POA: Diagnosis not present

## 2022-09-26 DIAGNOSIS — F331 Major depressive disorder, recurrent, moderate: Secondary | ICD-10-CM | POA: Diagnosis not present

## 2022-09-26 DIAGNOSIS — Z791 Long term (current) use of non-steroidal anti-inflammatories (NSAID): Secondary | ICD-10-CM | POA: Diagnosis not present

## 2022-09-26 DIAGNOSIS — F411 Generalized anxiety disorder: Secondary | ICD-10-CM | POA: Diagnosis not present

## 2022-09-26 DIAGNOSIS — G8929 Other chronic pain: Secondary | ICD-10-CM | POA: Diagnosis not present

## 2022-10-04 DIAGNOSIS — H2513 Age-related nuclear cataract, bilateral: Secondary | ICD-10-CM | POA: Diagnosis not present

## 2022-10-04 DIAGNOSIS — H18522 Epithelial (juvenile) corneal dystrophy, left eye: Secondary | ICD-10-CM | POA: Diagnosis not present

## 2022-10-04 DIAGNOSIS — E119 Type 2 diabetes mellitus without complications: Secondary | ICD-10-CM | POA: Diagnosis not present

## 2022-10-04 DIAGNOSIS — Z79899 Other long term (current) drug therapy: Secondary | ICD-10-CM | POA: Diagnosis not present

## 2022-10-05 DIAGNOSIS — F411 Generalized anxiety disorder: Secondary | ICD-10-CM | POA: Diagnosis not present

## 2022-10-05 DIAGNOSIS — F331 Major depressive disorder, recurrent, moderate: Secondary | ICD-10-CM | POA: Diagnosis not present

## 2022-10-07 DIAGNOSIS — E1151 Type 2 diabetes mellitus with diabetic peripheral angiopathy without gangrene: Secondary | ICD-10-CM | POA: Diagnosis not present

## 2022-10-07 DIAGNOSIS — L602 Onychogryphosis: Secondary | ICD-10-CM | POA: Diagnosis not present

## 2022-10-07 DIAGNOSIS — Z7984 Long term (current) use of oral hypoglycemic drugs: Secondary | ICD-10-CM | POA: Diagnosis not present

## 2022-10-07 DIAGNOSIS — L603 Nail dystrophy: Secondary | ICD-10-CM | POA: Diagnosis not present

## 2022-10-11 DIAGNOSIS — E119 Type 2 diabetes mellitus without complications: Secondary | ICD-10-CM | POA: Diagnosis not present

## 2022-10-11 DIAGNOSIS — I10 Essential (primary) hypertension: Secondary | ICD-10-CM | POA: Diagnosis not present

## 2022-10-11 DIAGNOSIS — E785 Hyperlipidemia, unspecified: Secondary | ICD-10-CM | POA: Diagnosis not present

## 2022-10-17 DIAGNOSIS — G8929 Other chronic pain: Secondary | ICD-10-CM | POA: Diagnosis not present

## 2022-10-21 DIAGNOSIS — E1169 Type 2 diabetes mellitus with other specified complication: Secondary | ICD-10-CM | POA: Diagnosis not present

## 2022-10-21 DIAGNOSIS — E46 Unspecified protein-calorie malnutrition: Secondary | ICD-10-CM | POA: Diagnosis not present

## 2022-10-21 DIAGNOSIS — E113291 Type 2 diabetes mellitus with mild nonproliferative diabetic retinopathy without macular edema, right eye: Secondary | ICD-10-CM | POA: Diagnosis not present

## 2022-10-21 DIAGNOSIS — E785 Hyperlipidemia, unspecified: Secondary | ICD-10-CM | POA: Diagnosis not present

## 2022-10-24 DIAGNOSIS — F5105 Insomnia due to other mental disorder: Secondary | ICD-10-CM | POA: Diagnosis not present

## 2022-10-24 DIAGNOSIS — F331 Major depressive disorder, recurrent, moderate: Secondary | ICD-10-CM | POA: Diagnosis not present

## 2022-10-24 DIAGNOSIS — F411 Generalized anxiety disorder: Secondary | ICD-10-CM | POA: Diagnosis not present

## 2022-10-24 DIAGNOSIS — G8929 Other chronic pain: Secondary | ICD-10-CM | POA: Diagnosis not present

## 2022-10-25 DIAGNOSIS — Z515 Encounter for palliative care: Secondary | ICD-10-CM | POA: Diagnosis not present

## 2022-10-25 DIAGNOSIS — R1312 Dysphagia, oropharyngeal phase: Secondary | ICD-10-CM | POA: Diagnosis not present

## 2022-10-25 DIAGNOSIS — I6930 Unspecified sequelae of cerebral infarction: Secondary | ICD-10-CM | POA: Diagnosis not present

## 2022-10-25 DIAGNOSIS — E46 Unspecified protein-calorie malnutrition: Secondary | ICD-10-CM | POA: Diagnosis not present

## 2022-10-25 DIAGNOSIS — F03918 Unspecified dementia, unspecified severity, with other behavioral disturbance: Secondary | ICD-10-CM | POA: Diagnosis not present

## 2022-10-25 DIAGNOSIS — G20B2 Parkinson's disease with dyskinesia, with fluctuations: Secondary | ICD-10-CM | POA: Diagnosis not present

## 2022-10-25 DIAGNOSIS — R630 Anorexia: Secondary | ICD-10-CM | POA: Diagnosis not present

## 2022-10-25 DIAGNOSIS — R627 Adult failure to thrive: Secondary | ICD-10-CM | POA: Diagnosis not present

## 2022-10-26 DIAGNOSIS — F411 Generalized anxiety disorder: Secondary | ICD-10-CM | POA: Diagnosis not present

## 2022-10-26 DIAGNOSIS — F331 Major depressive disorder, recurrent, moderate: Secondary | ICD-10-CM | POA: Diagnosis not present

## 2022-10-31 DIAGNOSIS — F419 Anxiety disorder, unspecified: Secondary | ICD-10-CM | POA: Diagnosis not present

## 2022-10-31 DIAGNOSIS — I10 Essential (primary) hypertension: Secondary | ICD-10-CM | POA: Diagnosis not present

## 2022-11-07 DIAGNOSIS — G3184 Mild cognitive impairment, so stated: Secondary | ICD-10-CM | POA: Diagnosis not present

## 2022-11-07 DIAGNOSIS — F5105 Insomnia due to other mental disorder: Secondary | ICD-10-CM | POA: Diagnosis not present

## 2022-11-07 DIAGNOSIS — G8929 Other chronic pain: Secondary | ICD-10-CM | POA: Diagnosis not present

## 2022-11-07 DIAGNOSIS — F411 Generalized anxiety disorder: Secondary | ICD-10-CM | POA: Diagnosis not present

## 2022-11-07 DIAGNOSIS — F331 Major depressive disorder, recurrent, moderate: Secondary | ICD-10-CM | POA: Diagnosis not present

## 2022-11-09 DIAGNOSIS — F331 Major depressive disorder, recurrent, moderate: Secondary | ICD-10-CM | POA: Diagnosis not present

## 2022-11-09 DIAGNOSIS — E119 Type 2 diabetes mellitus without complications: Secondary | ICD-10-CM | POA: Diagnosis not present

## 2022-11-09 DIAGNOSIS — F411 Generalized anxiety disorder: Secondary | ICD-10-CM | POA: Diagnosis not present

## 2022-11-09 DIAGNOSIS — Z8673 Personal history of transient ischemic attack (TIA), and cerebral infarction without residual deficits: Secondary | ICD-10-CM | POA: Diagnosis not present

## 2022-11-09 DIAGNOSIS — I2699 Other pulmonary embolism without acute cor pulmonale: Secondary | ICD-10-CM | POA: Diagnosis not present

## 2022-11-09 DIAGNOSIS — N183 Chronic kidney disease, stage 3 unspecified: Secondary | ICD-10-CM | POA: Diagnosis not present

## 2022-11-09 DIAGNOSIS — G8929 Other chronic pain: Secondary | ICD-10-CM | POA: Diagnosis not present

## 2022-11-09 DIAGNOSIS — R4701 Aphasia: Secondary | ICD-10-CM | POA: Diagnosis not present

## 2022-11-09 DIAGNOSIS — N400 Enlarged prostate without lower urinary tract symptoms: Secondary | ICD-10-CM | POA: Diagnosis not present

## 2022-11-09 DIAGNOSIS — F32A Depression, unspecified: Secondary | ICD-10-CM | POA: Diagnosis not present

## 2022-11-17 DIAGNOSIS — G8929 Other chronic pain: Secondary | ICD-10-CM | POA: Diagnosis not present

## 2022-11-17 DIAGNOSIS — E119 Type 2 diabetes mellitus without complications: Secondary | ICD-10-CM | POA: Diagnosis not present

## 2022-11-17 DIAGNOSIS — F32A Depression, unspecified: Secondary | ICD-10-CM | POA: Diagnosis not present

## 2022-11-17 DIAGNOSIS — Z8673 Personal history of transient ischemic attack (TIA), and cerebral infarction without residual deficits: Secondary | ICD-10-CM | POA: Diagnosis not present

## 2022-11-17 DIAGNOSIS — N183 Chronic kidney disease, stage 3 unspecified: Secondary | ICD-10-CM | POA: Diagnosis not present

## 2022-11-17 DIAGNOSIS — N400 Enlarged prostate without lower urinary tract symptoms: Secondary | ICD-10-CM | POA: Diagnosis not present

## 2022-11-17 DIAGNOSIS — R4701 Aphasia: Secondary | ICD-10-CM | POA: Diagnosis not present

## 2022-11-21 DIAGNOSIS — F411 Generalized anxiety disorder: Secondary | ICD-10-CM | POA: Diagnosis not present

## 2022-11-21 DIAGNOSIS — G3184 Mild cognitive impairment, so stated: Secondary | ICD-10-CM | POA: Diagnosis not present

## 2022-11-21 DIAGNOSIS — F5105 Insomnia due to other mental disorder: Secondary | ICD-10-CM | POA: Diagnosis not present

## 2022-11-21 DIAGNOSIS — F331 Major depressive disorder, recurrent, moderate: Secondary | ICD-10-CM | POA: Diagnosis not present

## 2022-11-21 DIAGNOSIS — G8929 Other chronic pain: Secondary | ICD-10-CM | POA: Diagnosis not present

## 2022-11-30 DIAGNOSIS — F411 Generalized anxiety disorder: Secondary | ICD-10-CM | POA: Diagnosis not present

## 2022-11-30 DIAGNOSIS — F331 Major depressive disorder, recurrent, moderate: Secondary | ICD-10-CM | POA: Diagnosis not present

## 2022-12-01 DIAGNOSIS — Z760 Encounter for issue of repeat prescription: Secondary | ICD-10-CM | POA: Diagnosis not present

## 2022-12-01 DIAGNOSIS — N183 Chronic kidney disease, stage 3 unspecified: Secondary | ICD-10-CM | POA: Diagnosis not present

## 2022-12-01 DIAGNOSIS — Z8673 Personal history of transient ischemic attack (TIA), and cerebral infarction without residual deficits: Secondary | ICD-10-CM | POA: Diagnosis not present

## 2022-12-01 DIAGNOSIS — R4701 Aphasia: Secondary | ICD-10-CM | POA: Diagnosis not present

## 2022-12-01 DIAGNOSIS — E119 Type 2 diabetes mellitus without complications: Secondary | ICD-10-CM | POA: Diagnosis not present

## 2022-12-01 DIAGNOSIS — N400 Enlarged prostate without lower urinary tract symptoms: Secondary | ICD-10-CM | POA: Diagnosis not present

## 2022-12-01 DIAGNOSIS — G8929 Other chronic pain: Secondary | ICD-10-CM | POA: Diagnosis not present

## 2022-12-01 DIAGNOSIS — F32A Depression, unspecified: Secondary | ICD-10-CM | POA: Diagnosis not present

## 2022-12-07 DIAGNOSIS — N183 Chronic kidney disease, stage 3 unspecified: Secondary | ICD-10-CM | POA: Diagnosis not present

## 2022-12-07 DIAGNOSIS — E785 Hyperlipidemia, unspecified: Secondary | ICD-10-CM | POA: Diagnosis not present

## 2022-12-07 DIAGNOSIS — I10 Essential (primary) hypertension: Secondary | ICD-10-CM | POA: Diagnosis not present

## 2022-12-07 DIAGNOSIS — F32A Depression, unspecified: Secondary | ICD-10-CM | POA: Diagnosis not present

## 2022-12-07 DIAGNOSIS — G8929 Other chronic pain: Secondary | ICD-10-CM | POA: Diagnosis not present

## 2022-12-07 DIAGNOSIS — E119 Type 2 diabetes mellitus without complications: Secondary | ICD-10-CM | POA: Diagnosis not present

## 2022-12-07 DIAGNOSIS — N400 Enlarged prostate without lower urinary tract symptoms: Secondary | ICD-10-CM | POA: Diagnosis not present

## 2022-12-08 DIAGNOSIS — E119 Type 2 diabetes mellitus without complications: Secondary | ICD-10-CM | POA: Diagnosis not present

## 2022-12-08 DIAGNOSIS — E11319 Type 2 diabetes mellitus with unspecified diabetic retinopathy without macular edema: Secondary | ICD-10-CM | POA: Diagnosis not present

## 2022-12-08 DIAGNOSIS — I69320 Aphasia following cerebral infarction: Secondary | ICD-10-CM | POA: Diagnosis not present

## 2022-12-08 DIAGNOSIS — E1169 Type 2 diabetes mellitus with other specified complication: Secondary | ICD-10-CM | POA: Diagnosis not present

## 2022-12-08 DIAGNOSIS — F419 Anxiety disorder, unspecified: Secondary | ICD-10-CM | POA: Diagnosis not present

## 2022-12-08 DIAGNOSIS — M47817 Spondylosis without myelopathy or radiculopathy, lumbosacral region: Secondary | ICD-10-CM | POA: Diagnosis not present

## 2022-12-08 DIAGNOSIS — G20A1 Parkinson's disease without dyskinesia, without mention of fluctuations: Secondary | ICD-10-CM | POA: Diagnosis not present

## 2022-12-08 DIAGNOSIS — G894 Chronic pain syndrome: Secondary | ICD-10-CM | POA: Diagnosis not present

## 2022-12-08 DIAGNOSIS — F324 Major depressive disorder, single episode, in partial remission: Secondary | ICD-10-CM | POA: Diagnosis not present

## 2022-12-08 DIAGNOSIS — R1312 Dysphagia, oropharyngeal phase: Secondary | ICD-10-CM | POA: Diagnosis not present

## 2022-12-08 DIAGNOSIS — M6281 Muscle weakness (generalized): Secondary | ICD-10-CM | POA: Diagnosis not present

## 2022-12-08 DIAGNOSIS — Z86711 Personal history of pulmonary embolism: Secondary | ICD-10-CM | POA: Diagnosis not present

## 2022-12-12 DIAGNOSIS — I10 Essential (primary) hypertension: Secondary | ICD-10-CM | POA: Diagnosis not present

## 2022-12-12 DIAGNOSIS — N1831 Chronic kidney disease, stage 3a: Secondary | ICD-10-CM | POA: Diagnosis not present

## 2022-12-12 DIAGNOSIS — E1122 Type 2 diabetes mellitus with diabetic chronic kidney disease: Secondary | ICD-10-CM | POA: Diagnosis not present

## 2022-12-12 DIAGNOSIS — M47817 Spondylosis without myelopathy or radiculopathy, lumbosacral region: Secondary | ICD-10-CM | POA: Diagnosis not present

## 2022-12-12 DIAGNOSIS — Z7984 Long term (current) use of oral hypoglycemic drugs: Secondary | ICD-10-CM | POA: Diagnosis not present

## 2022-12-13 DIAGNOSIS — Z741 Need for assistance with personal care: Secondary | ICD-10-CM | POA: Diagnosis not present

## 2022-12-13 DIAGNOSIS — R2689 Other abnormalities of gait and mobility: Secondary | ICD-10-CM | POA: Diagnosis not present

## 2022-12-13 DIAGNOSIS — R262 Difficulty in walking, not elsewhere classified: Secondary | ICD-10-CM | POA: Diagnosis not present

## 2022-12-14 DIAGNOSIS — Z741 Need for assistance with personal care: Secondary | ICD-10-CM | POA: Diagnosis not present

## 2022-12-14 DIAGNOSIS — R2689 Other abnormalities of gait and mobility: Secondary | ICD-10-CM | POA: Diagnosis not present

## 2022-12-14 DIAGNOSIS — R262 Difficulty in walking, not elsewhere classified: Secondary | ICD-10-CM | POA: Diagnosis not present

## 2022-12-15 DIAGNOSIS — R262 Difficulty in walking, not elsewhere classified: Secondary | ICD-10-CM | POA: Diagnosis not present

## 2022-12-15 DIAGNOSIS — Z741 Need for assistance with personal care: Secondary | ICD-10-CM | POA: Diagnosis not present

## 2022-12-15 DIAGNOSIS — R2689 Other abnormalities of gait and mobility: Secondary | ICD-10-CM | POA: Diagnosis not present

## 2022-12-16 DIAGNOSIS — R2689 Other abnormalities of gait and mobility: Secondary | ICD-10-CM | POA: Diagnosis not present

## 2022-12-16 DIAGNOSIS — R262 Difficulty in walking, not elsewhere classified: Secondary | ICD-10-CM | POA: Diagnosis not present

## 2022-12-16 DIAGNOSIS — Z741 Need for assistance with personal care: Secondary | ICD-10-CM | POA: Diagnosis not present

## 2022-12-19 DIAGNOSIS — R262 Difficulty in walking, not elsewhere classified: Secondary | ICD-10-CM | POA: Diagnosis not present

## 2022-12-19 DIAGNOSIS — R2689 Other abnormalities of gait and mobility: Secondary | ICD-10-CM | POA: Diagnosis not present

## 2022-12-19 DIAGNOSIS — Z741 Need for assistance with personal care: Secondary | ICD-10-CM | POA: Diagnosis not present

## 2022-12-20 DIAGNOSIS — R262 Difficulty in walking, not elsewhere classified: Secondary | ICD-10-CM | POA: Diagnosis not present

## 2022-12-20 DIAGNOSIS — Z741 Need for assistance with personal care: Secondary | ICD-10-CM | POA: Diagnosis not present

## 2022-12-20 DIAGNOSIS — R2689 Other abnormalities of gait and mobility: Secondary | ICD-10-CM | POA: Diagnosis not present

## 2022-12-21 DIAGNOSIS — R262 Difficulty in walking, not elsewhere classified: Secondary | ICD-10-CM | POA: Diagnosis not present

## 2022-12-21 DIAGNOSIS — Z741 Need for assistance with personal care: Secondary | ICD-10-CM | POA: Diagnosis not present

## 2022-12-21 DIAGNOSIS — R2689 Other abnormalities of gait and mobility: Secondary | ICD-10-CM | POA: Diagnosis not present

## 2022-12-22 DIAGNOSIS — R262 Difficulty in walking, not elsewhere classified: Secondary | ICD-10-CM | POA: Diagnosis not present

## 2022-12-22 DIAGNOSIS — Z741 Need for assistance with personal care: Secondary | ICD-10-CM | POA: Diagnosis not present

## 2022-12-22 DIAGNOSIS — R2689 Other abnormalities of gait and mobility: Secondary | ICD-10-CM | POA: Diagnosis not present

## 2022-12-23 DIAGNOSIS — R2689 Other abnormalities of gait and mobility: Secondary | ICD-10-CM | POA: Diagnosis not present

## 2022-12-23 DIAGNOSIS — R262 Difficulty in walking, not elsewhere classified: Secondary | ICD-10-CM | POA: Diagnosis not present

## 2022-12-23 DIAGNOSIS — Z741 Need for assistance with personal care: Secondary | ICD-10-CM | POA: Diagnosis not present

## 2022-12-26 DIAGNOSIS — Z741 Need for assistance with personal care: Secondary | ICD-10-CM | POA: Diagnosis not present

## 2022-12-26 DIAGNOSIS — R262 Difficulty in walking, not elsewhere classified: Secondary | ICD-10-CM | POA: Diagnosis not present

## 2022-12-26 DIAGNOSIS — R2689 Other abnormalities of gait and mobility: Secondary | ICD-10-CM | POA: Diagnosis not present

## 2022-12-27 DIAGNOSIS — Z741 Need for assistance with personal care: Secondary | ICD-10-CM | POA: Diagnosis not present

## 2022-12-27 DIAGNOSIS — F4323 Adjustment disorder with mixed anxiety and depressed mood: Secondary | ICD-10-CM | POA: Diagnosis not present

## 2022-12-27 DIAGNOSIS — R2689 Other abnormalities of gait and mobility: Secondary | ICD-10-CM | POA: Diagnosis not present

## 2022-12-27 DIAGNOSIS — R262 Difficulty in walking, not elsewhere classified: Secondary | ICD-10-CM | POA: Diagnosis not present

## 2023-01-02 DIAGNOSIS — K5900 Constipation, unspecified: Secondary | ICD-10-CM | POA: Diagnosis not present

## 2023-01-02 DIAGNOSIS — N400 Enlarged prostate without lower urinary tract symptoms: Secondary | ICD-10-CM | POA: Diagnosis not present

## 2023-01-02 DIAGNOSIS — N189 Chronic kidney disease, unspecified: Secondary | ICD-10-CM | POA: Diagnosis not present

## 2023-01-02 DIAGNOSIS — E1122 Type 2 diabetes mellitus with diabetic chronic kidney disease: Secondary | ICD-10-CM | POA: Diagnosis not present

## 2023-01-02 DIAGNOSIS — I10 Essential (primary) hypertension: Secondary | ICD-10-CM | POA: Diagnosis not present

## 2023-01-02 DIAGNOSIS — I69320 Aphasia following cerebral infarction: Secondary | ICD-10-CM | POA: Diagnosis not present

## 2023-01-02 DIAGNOSIS — F324 Major depressive disorder, single episode, in partial remission: Secondary | ICD-10-CM | POA: Diagnosis not present

## 2023-01-02 DIAGNOSIS — Z7984 Long term (current) use of oral hypoglycemic drugs: Secondary | ICD-10-CM | POA: Diagnosis not present

## 2023-01-10 DIAGNOSIS — F4323 Adjustment disorder with mixed anxiety and depressed mood: Secondary | ICD-10-CM | POA: Diagnosis not present

## 2023-01-13 DIAGNOSIS — F331 Major depressive disorder, recurrent, moderate: Secondary | ICD-10-CM | POA: Diagnosis not present

## 2023-01-13 DIAGNOSIS — F411 Generalized anxiety disorder: Secondary | ICD-10-CM | POA: Diagnosis not present

## 2023-01-13 DIAGNOSIS — F5105 Insomnia due to other mental disorder: Secondary | ICD-10-CM | POA: Diagnosis not present

## 2023-01-20 DIAGNOSIS — F324 Major depressive disorder, single episode, in partial remission: Secondary | ICD-10-CM | POA: Diagnosis not present

## 2023-01-20 DIAGNOSIS — K761 Chronic passive congestion of liver: Secondary | ICD-10-CM | POA: Diagnosis not present

## 2023-01-20 DIAGNOSIS — R600 Localized edema: Secondary | ICD-10-CM | POA: Diagnosis not present

## 2023-01-20 DIAGNOSIS — R197 Diarrhea, unspecified: Secondary | ICD-10-CM | POA: Diagnosis not present

## 2023-01-20 DIAGNOSIS — E1122 Type 2 diabetes mellitus with diabetic chronic kidney disease: Secondary | ICD-10-CM | POA: Diagnosis not present

## 2023-01-20 DIAGNOSIS — Z7984 Long term (current) use of oral hypoglycemic drugs: Secondary | ICD-10-CM | POA: Diagnosis not present

## 2023-01-20 DIAGNOSIS — I10 Essential (primary) hypertension: Secondary | ICD-10-CM | POA: Diagnosis not present

## 2023-01-20 DIAGNOSIS — N189 Chronic kidney disease, unspecified: Secondary | ICD-10-CM | POA: Diagnosis not present

## 2023-01-20 DIAGNOSIS — I69320 Aphasia following cerebral infarction: Secondary | ICD-10-CM | POA: Diagnosis not present

## 2023-01-26 DIAGNOSIS — R5383 Other fatigue: Secondary | ICD-10-CM | POA: Diagnosis not present

## 2023-01-26 DIAGNOSIS — R5381 Other malaise: Secondary | ICD-10-CM | POA: Diagnosis not present

## 2023-01-26 DIAGNOSIS — R0689 Other abnormalities of breathing: Secondary | ICD-10-CM | POA: Diagnosis not present

## 2023-01-31 DIAGNOSIS — F4323 Adjustment disorder with mixed anxiety and depressed mood: Secondary | ICD-10-CM | POA: Diagnosis not present

## 2023-01-31 DIAGNOSIS — R600 Localized edema: Secondary | ICD-10-CM | POA: Diagnosis not present

## 2023-02-02 DIAGNOSIS — R197 Diarrhea, unspecified: Secondary | ICD-10-CM | POA: Diagnosis not present

## 2023-02-03 DIAGNOSIS — F5105 Insomnia due to other mental disorder: Secondary | ICD-10-CM | POA: Diagnosis not present

## 2023-02-10 DIAGNOSIS — F411 Generalized anxiety disorder: Secondary | ICD-10-CM | POA: Diagnosis not present

## 2023-02-10 DIAGNOSIS — R197 Diarrhea, unspecified: Secondary | ICD-10-CM | POA: Diagnosis not present

## 2023-02-10 DIAGNOSIS — F331 Major depressive disorder, recurrent, moderate: Secondary | ICD-10-CM | POA: Diagnosis not present

## 2023-02-10 DIAGNOSIS — F5105 Insomnia due to other mental disorder: Secondary | ICD-10-CM | POA: Diagnosis not present

## 2023-02-14 DIAGNOSIS — F4323 Adjustment disorder with mixed anxiety and depressed mood: Secondary | ICD-10-CM | POA: Diagnosis not present

## 2023-02-17 DIAGNOSIS — K580 Irritable bowel syndrome with diarrhea: Secondary | ICD-10-CM | POA: Diagnosis not present

## 2023-02-28 DIAGNOSIS — F4323 Adjustment disorder with mixed anxiety and depressed mood: Secondary | ICD-10-CM | POA: Diagnosis not present

## 2023-03-03 DIAGNOSIS — F331 Major depressive disorder, recurrent, moderate: Secondary | ICD-10-CM | POA: Diagnosis not present

## 2023-03-06 DIAGNOSIS — E119 Type 2 diabetes mellitus without complications: Secondary | ICD-10-CM | POA: Diagnosis not present

## 2023-03-06 DIAGNOSIS — E113291 Type 2 diabetes mellitus with mild nonproliferative diabetic retinopathy without macular edema, right eye: Secondary | ICD-10-CM | POA: Diagnosis not present

## 2023-03-06 DIAGNOSIS — E785 Hyperlipidemia, unspecified: Secondary | ICD-10-CM | POA: Diagnosis not present

## 2023-03-06 DIAGNOSIS — I2583 Coronary atherosclerosis due to lipid rich plaque: Secondary | ICD-10-CM | POA: Diagnosis not present

## 2023-03-06 DIAGNOSIS — Z79899 Other long term (current) drug therapy: Secondary | ICD-10-CM | POA: Diagnosis not present

## 2023-03-06 DIAGNOSIS — F324 Major depressive disorder, single episode, in partial remission: Secondary | ICD-10-CM | POA: Diagnosis not present

## 2023-03-06 DIAGNOSIS — I2584 Coronary atherosclerosis due to calcified coronary lesion: Secondary | ICD-10-CM | POA: Diagnosis not present

## 2023-03-06 DIAGNOSIS — I10 Essential (primary) hypertension: Secondary | ICD-10-CM | POA: Diagnosis not present

## 2023-03-06 DIAGNOSIS — I69320 Aphasia following cerebral infarction: Secondary | ICD-10-CM | POA: Diagnosis not present

## 2023-03-06 DIAGNOSIS — R4189 Other symptoms and signs involving cognitive functions and awareness: Secondary | ICD-10-CM | POA: Diagnosis not present

## 2023-03-06 DIAGNOSIS — K580 Irritable bowel syndrome with diarrhea: Secondary | ICD-10-CM | POA: Diagnosis not present

## 2023-03-06 DIAGNOSIS — E1122 Type 2 diabetes mellitus with diabetic chronic kidney disease: Secondary | ICD-10-CM | POA: Diagnosis not present

## 2023-03-06 DIAGNOSIS — Z7984 Long term (current) use of oral hypoglycemic drugs: Secondary | ICD-10-CM | POA: Diagnosis not present

## 2023-03-06 DIAGNOSIS — N189 Chronic kidney disease, unspecified: Secondary | ICD-10-CM | POA: Diagnosis not present

## 2023-03-10 DIAGNOSIS — F331 Major depressive disorder, recurrent, moderate: Secondary | ICD-10-CM | POA: Diagnosis not present

## 2023-03-10 DIAGNOSIS — F5105 Insomnia due to other mental disorder: Secondary | ICD-10-CM | POA: Diagnosis not present

## 2023-03-10 DIAGNOSIS — F411 Generalized anxiety disorder: Secondary | ICD-10-CM | POA: Diagnosis not present

## 2023-03-14 DIAGNOSIS — F4323 Adjustment disorder with mixed anxiety and depressed mood: Secondary | ICD-10-CM | POA: Diagnosis not present

## 2023-03-20 DIAGNOSIS — N189 Chronic kidney disease, unspecified: Secondary | ICD-10-CM | POA: Diagnosis not present

## 2023-03-20 DIAGNOSIS — E1122 Type 2 diabetes mellitus with diabetic chronic kidney disease: Secondary | ICD-10-CM | POA: Diagnosis not present

## 2023-03-20 DIAGNOSIS — I69320 Aphasia following cerebral infarction: Secondary | ICD-10-CM | POA: Diagnosis not present

## 2023-03-20 DIAGNOSIS — R600 Localized edema: Secondary | ICD-10-CM | POA: Diagnosis not present

## 2023-03-20 DIAGNOSIS — K580 Irritable bowel syndrome with diarrhea: Secondary | ICD-10-CM | POA: Diagnosis not present

## 2023-03-20 DIAGNOSIS — Z7984 Long term (current) use of oral hypoglycemic drugs: Secondary | ICD-10-CM | POA: Diagnosis not present

## 2023-03-21 DIAGNOSIS — R269 Unspecified abnormalities of gait and mobility: Secondary | ICD-10-CM | POA: Diagnosis not present

## 2023-03-21 DIAGNOSIS — R262 Difficulty in walking, not elsewhere classified: Secondary | ICD-10-CM | POA: Diagnosis not present

## 2023-03-21 DIAGNOSIS — R2689 Other abnormalities of gait and mobility: Secondary | ICD-10-CM | POA: Diagnosis not present

## 2023-03-21 DIAGNOSIS — Z741 Need for assistance with personal care: Secondary | ICD-10-CM | POA: Diagnosis not present

## 2023-03-22 DIAGNOSIS — R262 Difficulty in walking, not elsewhere classified: Secondary | ICD-10-CM | POA: Diagnosis not present

## 2023-03-22 DIAGNOSIS — Z741 Need for assistance with personal care: Secondary | ICD-10-CM | POA: Diagnosis not present

## 2023-03-22 DIAGNOSIS — R269 Unspecified abnormalities of gait and mobility: Secondary | ICD-10-CM | POA: Diagnosis not present

## 2023-03-22 DIAGNOSIS — R2689 Other abnormalities of gait and mobility: Secondary | ICD-10-CM | POA: Diagnosis not present

## 2023-03-23 DIAGNOSIS — R2689 Other abnormalities of gait and mobility: Secondary | ICD-10-CM | POA: Diagnosis not present

## 2023-03-23 DIAGNOSIS — R262 Difficulty in walking, not elsewhere classified: Secondary | ICD-10-CM | POA: Diagnosis not present

## 2023-03-23 DIAGNOSIS — R269 Unspecified abnormalities of gait and mobility: Secondary | ICD-10-CM | POA: Diagnosis not present

## 2023-03-23 DIAGNOSIS — Z741 Need for assistance with personal care: Secondary | ICD-10-CM | POA: Diagnosis not present

## 2023-03-25 DIAGNOSIS — A0471 Enterocolitis due to Clostridium difficile, recurrent: Secondary | ICD-10-CM | POA: Diagnosis not present

## 2023-03-25 DIAGNOSIS — R2689 Other abnormalities of gait and mobility: Secondary | ICD-10-CM | POA: Diagnosis not present

## 2023-03-25 DIAGNOSIS — Z741 Need for assistance with personal care: Secondary | ICD-10-CM | POA: Diagnosis not present

## 2023-03-25 DIAGNOSIS — R262 Difficulty in walking, not elsewhere classified: Secondary | ICD-10-CM | POA: Diagnosis not present

## 2023-03-25 DIAGNOSIS — R269 Unspecified abnormalities of gait and mobility: Secondary | ICD-10-CM | POA: Diagnosis not present

## 2023-03-27 DIAGNOSIS — R262 Difficulty in walking, not elsewhere classified: Secondary | ICD-10-CM | POA: Diagnosis not present

## 2023-03-27 DIAGNOSIS — R2689 Other abnormalities of gait and mobility: Secondary | ICD-10-CM | POA: Diagnosis not present

## 2023-03-27 DIAGNOSIS — R269 Unspecified abnormalities of gait and mobility: Secondary | ICD-10-CM | POA: Diagnosis not present

## 2023-03-27 DIAGNOSIS — Z741 Need for assistance with personal care: Secondary | ICD-10-CM | POA: Diagnosis not present

## 2023-03-28 DIAGNOSIS — R2689 Other abnormalities of gait and mobility: Secondary | ICD-10-CM | POA: Diagnosis not present

## 2023-03-28 DIAGNOSIS — R262 Difficulty in walking, not elsewhere classified: Secondary | ICD-10-CM | POA: Diagnosis not present

## 2023-03-28 DIAGNOSIS — Z741 Need for assistance with personal care: Secondary | ICD-10-CM | POA: Diagnosis not present

## 2023-03-28 DIAGNOSIS — R269 Unspecified abnormalities of gait and mobility: Secondary | ICD-10-CM | POA: Diagnosis not present

## 2023-03-28 DIAGNOSIS — A0471 Enterocolitis due to Clostridium difficile, recurrent: Secondary | ICD-10-CM | POA: Diagnosis not present

## 2023-03-29 DIAGNOSIS — R269 Unspecified abnormalities of gait and mobility: Secondary | ICD-10-CM | POA: Diagnosis not present

## 2023-03-29 DIAGNOSIS — R2689 Other abnormalities of gait and mobility: Secondary | ICD-10-CM | POA: Diagnosis not present

## 2023-03-29 DIAGNOSIS — R262 Difficulty in walking, not elsewhere classified: Secondary | ICD-10-CM | POA: Diagnosis not present

## 2023-03-29 DIAGNOSIS — Z741 Need for assistance with personal care: Secondary | ICD-10-CM | POA: Diagnosis not present

## 2023-03-30 DIAGNOSIS — R262 Difficulty in walking, not elsewhere classified: Secondary | ICD-10-CM | POA: Diagnosis not present

## 2023-03-30 DIAGNOSIS — R269 Unspecified abnormalities of gait and mobility: Secondary | ICD-10-CM | POA: Diagnosis not present

## 2023-03-30 DIAGNOSIS — R2689 Other abnormalities of gait and mobility: Secondary | ICD-10-CM | POA: Diagnosis not present

## 2023-03-30 DIAGNOSIS — Z741 Need for assistance with personal care: Secondary | ICD-10-CM | POA: Diagnosis not present

## 2023-03-31 DIAGNOSIS — I69320 Aphasia following cerebral infarction: Secondary | ICD-10-CM | POA: Diagnosis not present

## 2023-03-31 DIAGNOSIS — A0472 Enterocolitis due to Clostridium difficile, not specified as recurrent: Secondary | ICD-10-CM | POA: Diagnosis not present

## 2023-03-31 DIAGNOSIS — F5105 Insomnia due to other mental disorder: Secondary | ICD-10-CM | POA: Diagnosis not present

## 2023-03-31 DIAGNOSIS — Z741 Need for assistance with personal care: Secondary | ICD-10-CM | POA: Diagnosis not present

## 2023-03-31 DIAGNOSIS — R262 Difficulty in walking, not elsewhere classified: Secondary | ICD-10-CM | POA: Diagnosis not present

## 2023-03-31 DIAGNOSIS — Z8719 Personal history of other diseases of the digestive system: Secondary | ICD-10-CM | POA: Diagnosis not present

## 2023-03-31 DIAGNOSIS — R269 Unspecified abnormalities of gait and mobility: Secondary | ICD-10-CM | POA: Diagnosis not present

## 2023-03-31 DIAGNOSIS — R2689 Other abnormalities of gait and mobility: Secondary | ICD-10-CM | POA: Diagnosis not present

## 2023-04-03 DIAGNOSIS — R269 Unspecified abnormalities of gait and mobility: Secondary | ICD-10-CM | POA: Diagnosis not present

## 2023-04-03 DIAGNOSIS — Z741 Need for assistance with personal care: Secondary | ICD-10-CM | POA: Diagnosis not present

## 2023-04-03 DIAGNOSIS — R2681 Unsteadiness on feet: Secondary | ICD-10-CM | POA: Diagnosis not present

## 2023-04-03 DIAGNOSIS — R2689 Other abnormalities of gait and mobility: Secondary | ICD-10-CM | POA: Diagnosis not present

## 2023-04-04 DIAGNOSIS — Z741 Need for assistance with personal care: Secondary | ICD-10-CM | POA: Diagnosis not present

## 2023-04-04 DIAGNOSIS — R269 Unspecified abnormalities of gait and mobility: Secondary | ICD-10-CM | POA: Diagnosis not present

## 2023-04-04 DIAGNOSIS — R2689 Other abnormalities of gait and mobility: Secondary | ICD-10-CM | POA: Diagnosis not present

## 2023-04-04 DIAGNOSIS — R2681 Unsteadiness on feet: Secondary | ICD-10-CM | POA: Diagnosis not present

## 2023-04-05 DIAGNOSIS — Z741 Need for assistance with personal care: Secondary | ICD-10-CM | POA: Diagnosis not present

## 2023-04-05 DIAGNOSIS — R2681 Unsteadiness on feet: Secondary | ICD-10-CM | POA: Diagnosis not present

## 2023-04-05 DIAGNOSIS — R269 Unspecified abnormalities of gait and mobility: Secondary | ICD-10-CM | POA: Diagnosis not present

## 2023-04-05 DIAGNOSIS — R2689 Other abnormalities of gait and mobility: Secondary | ICD-10-CM | POA: Diagnosis not present

## 2023-04-06 DIAGNOSIS — R2689 Other abnormalities of gait and mobility: Secondary | ICD-10-CM | POA: Diagnosis not present

## 2023-04-06 DIAGNOSIS — R2681 Unsteadiness on feet: Secondary | ICD-10-CM | POA: Diagnosis not present

## 2023-04-06 DIAGNOSIS — Z741 Need for assistance with personal care: Secondary | ICD-10-CM | POA: Diagnosis not present

## 2023-04-06 DIAGNOSIS — R269 Unspecified abnormalities of gait and mobility: Secondary | ICD-10-CM | POA: Diagnosis not present

## 2023-04-07 DIAGNOSIS — R269 Unspecified abnormalities of gait and mobility: Secondary | ICD-10-CM | POA: Diagnosis not present

## 2023-04-07 DIAGNOSIS — Z741 Need for assistance with personal care: Secondary | ICD-10-CM | POA: Diagnosis not present

## 2023-04-07 DIAGNOSIS — R2689 Other abnormalities of gait and mobility: Secondary | ICD-10-CM | POA: Diagnosis not present

## 2023-04-07 DIAGNOSIS — G3184 Mild cognitive impairment, so stated: Secondary | ICD-10-CM | POA: Diagnosis not present

## 2023-04-07 DIAGNOSIS — R2681 Unsteadiness on feet: Secondary | ICD-10-CM | POA: Diagnosis not present

## 2023-04-07 DIAGNOSIS — F331 Major depressive disorder, recurrent, moderate: Secondary | ICD-10-CM | POA: Diagnosis not present

## 2023-04-11 DIAGNOSIS — R2689 Other abnormalities of gait and mobility: Secondary | ICD-10-CM | POA: Diagnosis not present

## 2023-04-11 DIAGNOSIS — R269 Unspecified abnormalities of gait and mobility: Secondary | ICD-10-CM | POA: Diagnosis not present

## 2023-04-11 DIAGNOSIS — R2681 Unsteadiness on feet: Secondary | ICD-10-CM | POA: Diagnosis not present

## 2023-04-11 DIAGNOSIS — F4323 Adjustment disorder with mixed anxiety and depressed mood: Secondary | ICD-10-CM | POA: Diagnosis not present

## 2023-04-11 DIAGNOSIS — Z741 Need for assistance with personal care: Secondary | ICD-10-CM | POA: Diagnosis not present

## 2023-04-12 DIAGNOSIS — R2681 Unsteadiness on feet: Secondary | ICD-10-CM | POA: Diagnosis not present

## 2023-04-12 DIAGNOSIS — Z741 Need for assistance with personal care: Secondary | ICD-10-CM | POA: Diagnosis not present

## 2023-04-12 DIAGNOSIS — L84 Corns and callosities: Secondary | ICD-10-CM | POA: Diagnosis not present

## 2023-04-12 DIAGNOSIS — L603 Nail dystrophy: Secondary | ICD-10-CM | POA: Diagnosis not present

## 2023-04-12 DIAGNOSIS — R269 Unspecified abnormalities of gait and mobility: Secondary | ICD-10-CM | POA: Diagnosis not present

## 2023-04-12 DIAGNOSIS — R2689 Other abnormalities of gait and mobility: Secondary | ICD-10-CM | POA: Diagnosis not present

## 2023-04-12 DIAGNOSIS — L602 Onychogryphosis: Secondary | ICD-10-CM | POA: Diagnosis not present

## 2023-04-12 DIAGNOSIS — Z7984 Long term (current) use of oral hypoglycemic drugs: Secondary | ICD-10-CM | POA: Diagnosis not present

## 2023-04-12 DIAGNOSIS — E1151 Type 2 diabetes mellitus with diabetic peripheral angiopathy without gangrene: Secondary | ICD-10-CM | POA: Diagnosis not present

## 2023-04-13 DIAGNOSIS — R269 Unspecified abnormalities of gait and mobility: Secondary | ICD-10-CM | POA: Diagnosis not present

## 2023-04-13 DIAGNOSIS — R2681 Unsteadiness on feet: Secondary | ICD-10-CM | POA: Diagnosis not present

## 2023-04-13 DIAGNOSIS — Z741 Need for assistance with personal care: Secondary | ICD-10-CM | POA: Diagnosis not present

## 2023-04-13 DIAGNOSIS — R2689 Other abnormalities of gait and mobility: Secondary | ICD-10-CM | POA: Diagnosis not present

## 2023-04-14 DIAGNOSIS — G20B2 Parkinson's disease with dyskinesia, with fluctuations: Secondary | ICD-10-CM | POA: Diagnosis not present

## 2023-04-14 DIAGNOSIS — Z515 Encounter for palliative care: Secondary | ICD-10-CM | POA: Diagnosis not present

## 2023-04-14 DIAGNOSIS — R635 Abnormal weight gain: Secondary | ICD-10-CM | POA: Diagnosis not present

## 2023-04-14 DIAGNOSIS — R54 Age-related physical debility: Secondary | ICD-10-CM | POA: Diagnosis not present

## 2023-04-15 DIAGNOSIS — Z741 Need for assistance with personal care: Secondary | ICD-10-CM | POA: Diagnosis not present

## 2023-04-15 DIAGNOSIS — R269 Unspecified abnormalities of gait and mobility: Secondary | ICD-10-CM | POA: Diagnosis not present

## 2023-04-15 DIAGNOSIS — R2681 Unsteadiness on feet: Secondary | ICD-10-CM | POA: Diagnosis not present

## 2023-04-15 DIAGNOSIS — R2689 Other abnormalities of gait and mobility: Secondary | ICD-10-CM | POA: Diagnosis not present

## 2023-04-17 DIAGNOSIS — R2689 Other abnormalities of gait and mobility: Secondary | ICD-10-CM | POA: Diagnosis not present

## 2023-04-17 DIAGNOSIS — R2681 Unsteadiness on feet: Secondary | ICD-10-CM | POA: Diagnosis not present

## 2023-04-17 DIAGNOSIS — R269 Unspecified abnormalities of gait and mobility: Secondary | ICD-10-CM | POA: Diagnosis not present

## 2023-04-17 DIAGNOSIS — Z741 Need for assistance with personal care: Secondary | ICD-10-CM | POA: Diagnosis not present

## 2023-04-19 DIAGNOSIS — Z741 Need for assistance with personal care: Secondary | ICD-10-CM | POA: Diagnosis not present

## 2023-04-19 DIAGNOSIS — R269 Unspecified abnormalities of gait and mobility: Secondary | ICD-10-CM | POA: Diagnosis not present

## 2023-04-19 DIAGNOSIS — R2681 Unsteadiness on feet: Secondary | ICD-10-CM | POA: Diagnosis not present

## 2023-04-19 DIAGNOSIS — R2689 Other abnormalities of gait and mobility: Secondary | ICD-10-CM | POA: Diagnosis not present

## 2023-04-20 DIAGNOSIS — R2681 Unsteadiness on feet: Secondary | ICD-10-CM | POA: Diagnosis not present

## 2023-04-20 DIAGNOSIS — R2689 Other abnormalities of gait and mobility: Secondary | ICD-10-CM | POA: Diagnosis not present

## 2023-04-20 DIAGNOSIS — Z741 Need for assistance with personal care: Secondary | ICD-10-CM | POA: Diagnosis not present

## 2023-04-20 DIAGNOSIS — R269 Unspecified abnormalities of gait and mobility: Secondary | ICD-10-CM | POA: Diagnosis not present

## 2023-04-21 DIAGNOSIS — R2681 Unsteadiness on feet: Secondary | ICD-10-CM | POA: Diagnosis not present

## 2023-04-21 DIAGNOSIS — R269 Unspecified abnormalities of gait and mobility: Secondary | ICD-10-CM | POA: Diagnosis not present

## 2023-04-21 DIAGNOSIS — Z741 Need for assistance with personal care: Secondary | ICD-10-CM | POA: Diagnosis not present

## 2023-04-21 DIAGNOSIS — R2689 Other abnormalities of gait and mobility: Secondary | ICD-10-CM | POA: Diagnosis not present

## 2023-04-25 DIAGNOSIS — F4323 Adjustment disorder with mixed anxiety and depressed mood: Secondary | ICD-10-CM | POA: Diagnosis not present

## 2023-04-26 DIAGNOSIS — H903 Sensorineural hearing loss, bilateral: Secondary | ICD-10-CM | POA: Diagnosis not present

## 2023-04-28 DIAGNOSIS — F331 Major depressive disorder, recurrent, moderate: Secondary | ICD-10-CM | POA: Diagnosis not present

## 2023-05-09 DIAGNOSIS — F4323 Adjustment disorder with mixed anxiety and depressed mood: Secondary | ICD-10-CM | POA: Diagnosis not present

## 2023-05-09 DIAGNOSIS — G20B2 Parkinson's disease with dyskinesia, with fluctuations: Secondary | ICD-10-CM | POA: Diagnosis not present

## 2023-05-09 DIAGNOSIS — R635 Abnormal weight gain: Secondary | ICD-10-CM | POA: Diagnosis not present

## 2023-05-09 DIAGNOSIS — R54 Age-related physical debility: Secondary | ICD-10-CM | POA: Diagnosis not present

## 2023-05-09 DIAGNOSIS — Z515 Encounter for palliative care: Secondary | ICD-10-CM | POA: Diagnosis not present

## 2023-05-11 DIAGNOSIS — F331 Major depressive disorder, recurrent, moderate: Secondary | ICD-10-CM | POA: Diagnosis not present

## 2023-05-11 DIAGNOSIS — G3184 Mild cognitive impairment, so stated: Secondary | ICD-10-CM | POA: Diagnosis not present

## 2023-05-23 DIAGNOSIS — F4323 Adjustment disorder with mixed anxiety and depressed mood: Secondary | ICD-10-CM | POA: Diagnosis not present

## 2023-05-29 DIAGNOSIS — Z8619 Personal history of other infectious and parasitic diseases: Secondary | ICD-10-CM | POA: Diagnosis not present

## 2023-05-29 DIAGNOSIS — K580 Irritable bowel syndrome with diarrhea: Secondary | ICD-10-CM | POA: Diagnosis not present

## 2023-05-29 DIAGNOSIS — I69320 Aphasia following cerebral infarction: Secondary | ICD-10-CM | POA: Diagnosis not present

## 2023-06-05 DIAGNOSIS — I69320 Aphasia following cerebral infarction: Secondary | ICD-10-CM | POA: Diagnosis not present

## 2023-06-05 DIAGNOSIS — K580 Irritable bowel syndrome with diarrhea: Secondary | ICD-10-CM | POA: Diagnosis not present

## 2023-06-05 DIAGNOSIS — R600 Localized edema: Secondary | ICD-10-CM | POA: Diagnosis not present

## 2023-06-05 DIAGNOSIS — S80212D Abrasion, left knee, subsequent encounter: Secondary | ICD-10-CM | POA: Diagnosis not present

## 2023-06-06 DIAGNOSIS — F4323 Adjustment disorder with mixed anxiety and depressed mood: Secondary | ICD-10-CM | POA: Diagnosis not present

## 2023-06-12 DIAGNOSIS — Z515 Encounter for palliative care: Secondary | ICD-10-CM | POA: Diagnosis not present

## 2023-06-12 DIAGNOSIS — R54 Age-related physical debility: Secondary | ICD-10-CM | POA: Diagnosis not present

## 2023-06-12 DIAGNOSIS — G20B2 Parkinson's disease with dyskinesia, with fluctuations: Secondary | ICD-10-CM | POA: Diagnosis not present

## 2023-06-12 DIAGNOSIS — R635 Abnormal weight gain: Secondary | ICD-10-CM | POA: Diagnosis not present

## 2023-06-21 DIAGNOSIS — F339 Major depressive disorder, recurrent, unspecified: Secondary | ICD-10-CM | POA: Diagnosis not present

## 2023-06-21 DIAGNOSIS — G8929 Other chronic pain: Secondary | ICD-10-CM | POA: Diagnosis not present

## 2023-06-22 DIAGNOSIS — F331 Major depressive disorder, recurrent, moderate: Secondary | ICD-10-CM | POA: Diagnosis not present

## 2023-06-22 DIAGNOSIS — R1312 Dysphagia, oropharyngeal phase: Secondary | ICD-10-CM | POA: Diagnosis not present

## 2023-06-23 DIAGNOSIS — R1312 Dysphagia, oropharyngeal phase: Secondary | ICD-10-CM | POA: Diagnosis not present

## 2023-06-26 DIAGNOSIS — R1312 Dysphagia, oropharyngeal phase: Secondary | ICD-10-CM | POA: Diagnosis not present

## 2023-06-27 DIAGNOSIS — R1312 Dysphagia, oropharyngeal phase: Secondary | ICD-10-CM | POA: Diagnosis not present

## 2023-06-27 DIAGNOSIS — I69320 Aphasia following cerebral infarction: Secondary | ICD-10-CM | POA: Diagnosis not present

## 2023-06-27 DIAGNOSIS — K580 Irritable bowel syndrome with diarrhea: Secondary | ICD-10-CM | POA: Diagnosis not present

## 2023-06-27 DIAGNOSIS — Z872 Personal history of diseases of the skin and subcutaneous tissue: Secondary | ICD-10-CM | POA: Diagnosis not present

## 2023-06-27 DIAGNOSIS — Z7984 Long term (current) use of oral hypoglycemic drugs: Secondary | ICD-10-CM | POA: Diagnosis not present

## 2023-06-27 DIAGNOSIS — R4189 Other symptoms and signs involving cognitive functions and awareness: Secondary | ICD-10-CM | POA: Diagnosis not present

## 2023-06-27 DIAGNOSIS — E1122 Type 2 diabetes mellitus with diabetic chronic kidney disease: Secondary | ICD-10-CM | POA: Diagnosis not present

## 2023-06-27 DIAGNOSIS — R600 Localized edema: Secondary | ICD-10-CM | POA: Diagnosis not present

## 2023-06-27 DIAGNOSIS — N189 Chronic kidney disease, unspecified: Secondary | ICD-10-CM | POA: Diagnosis not present

## 2023-06-27 DIAGNOSIS — Z8719 Personal history of other diseases of the digestive system: Secondary | ICD-10-CM | POA: Diagnosis not present

## 2023-06-28 DIAGNOSIS — R1312 Dysphagia, oropharyngeal phase: Secondary | ICD-10-CM | POA: Diagnosis not present

## 2023-06-29 DIAGNOSIS — R1312 Dysphagia, oropharyngeal phase: Secondary | ICD-10-CM | POA: Diagnosis not present

## 2023-06-30 DIAGNOSIS — R1312 Dysphagia, oropharyngeal phase: Secondary | ICD-10-CM | POA: Diagnosis not present

## 2023-07-03 DIAGNOSIS — L84 Corns and callosities: Secondary | ICD-10-CM | POA: Diagnosis not present

## 2023-07-03 DIAGNOSIS — L602 Onychogryphosis: Secondary | ICD-10-CM | POA: Diagnosis not present

## 2023-07-03 DIAGNOSIS — Z7984 Long term (current) use of oral hypoglycemic drugs: Secondary | ICD-10-CM | POA: Diagnosis not present

## 2023-07-03 DIAGNOSIS — L603 Nail dystrophy: Secondary | ICD-10-CM | POA: Diagnosis not present

## 2023-07-03 DIAGNOSIS — R1312 Dysphagia, oropharyngeal phase: Secondary | ICD-10-CM | POA: Diagnosis not present

## 2023-07-03 DIAGNOSIS — E1151 Type 2 diabetes mellitus with diabetic peripheral angiopathy without gangrene: Secondary | ICD-10-CM | POA: Diagnosis not present

## 2023-07-04 DIAGNOSIS — R1312 Dysphagia, oropharyngeal phase: Secondary | ICD-10-CM | POA: Diagnosis not present

## 2023-07-05 DIAGNOSIS — R1312 Dysphagia, oropharyngeal phase: Secondary | ICD-10-CM | POA: Diagnosis not present

## 2023-07-07 DIAGNOSIS — Z7984 Long term (current) use of oral hypoglycemic drugs: Secondary | ICD-10-CM | POA: Diagnosis not present

## 2023-07-07 DIAGNOSIS — F4323 Adjustment disorder with mixed anxiety and depressed mood: Secondary | ICD-10-CM | POA: Diagnosis not present

## 2023-07-07 DIAGNOSIS — I69320 Aphasia following cerebral infarction: Secondary | ICD-10-CM | POA: Diagnosis not present

## 2023-07-07 DIAGNOSIS — Z8619 Personal history of other infectious and parasitic diseases: Secondary | ICD-10-CM | POA: Diagnosis not present

## 2023-07-07 DIAGNOSIS — N189 Chronic kidney disease, unspecified: Secondary | ICD-10-CM | POA: Diagnosis not present

## 2023-07-07 DIAGNOSIS — E1122 Type 2 diabetes mellitus with diabetic chronic kidney disease: Secondary | ICD-10-CM | POA: Diagnosis not present

## 2023-07-07 DIAGNOSIS — R600 Localized edema: Secondary | ICD-10-CM | POA: Diagnosis not present

## 2023-07-07 DIAGNOSIS — K580 Irritable bowel syndrome with diarrhea: Secondary | ICD-10-CM | POA: Diagnosis not present

## 2023-07-12 DIAGNOSIS — E119 Type 2 diabetes mellitus without complications: Secondary | ICD-10-CM | POA: Diagnosis not present

## 2023-07-12 DIAGNOSIS — D649 Anemia, unspecified: Secondary | ICD-10-CM | POA: Diagnosis not present

## 2023-07-17 DIAGNOSIS — F331 Major depressive disorder, recurrent, moderate: Secondary | ICD-10-CM | POA: Diagnosis not present

## 2023-07-18 DIAGNOSIS — F4323 Adjustment disorder with mixed anxiety and depressed mood: Secondary | ICD-10-CM | POA: Diagnosis not present

## 2023-08-01 DIAGNOSIS — F4323 Adjustment disorder with mixed anxiety and depressed mood: Secondary | ICD-10-CM | POA: Diagnosis not present

## 2023-08-03 DIAGNOSIS — R54 Age-related physical debility: Secondary | ICD-10-CM | POA: Diagnosis not present

## 2023-08-03 DIAGNOSIS — Z515 Encounter for palliative care: Secondary | ICD-10-CM | POA: Diagnosis not present

## 2023-08-03 DIAGNOSIS — R635 Abnormal weight gain: Secondary | ICD-10-CM | POA: Diagnosis not present

## 2023-08-03 DIAGNOSIS — G20B2 Parkinson's disease with dyskinesia, with fluctuations: Secondary | ICD-10-CM | POA: Diagnosis not present

## 2023-08-15 DIAGNOSIS — F4323 Adjustment disorder with mixed anxiety and depressed mood: Secondary | ICD-10-CM | POA: Diagnosis not present

## 2023-08-17 DIAGNOSIS — G3184 Mild cognitive impairment, so stated: Secondary | ICD-10-CM | POA: Diagnosis not present

## 2023-08-17 DIAGNOSIS — F331 Major depressive disorder, recurrent, moderate: Secondary | ICD-10-CM | POA: Diagnosis not present

## 2023-08-28 DIAGNOSIS — N189 Chronic kidney disease, unspecified: Secondary | ICD-10-CM | POA: Diagnosis not present

## 2023-08-28 DIAGNOSIS — R600 Localized edema: Secondary | ICD-10-CM | POA: Diagnosis not present

## 2023-08-28 DIAGNOSIS — Z7984 Long term (current) use of oral hypoglycemic drugs: Secondary | ICD-10-CM | POA: Diagnosis not present

## 2023-08-28 DIAGNOSIS — E1122 Type 2 diabetes mellitus with diabetic chronic kidney disease: Secondary | ICD-10-CM | POA: Diagnosis not present

## 2023-08-29 DIAGNOSIS — F4323 Adjustment disorder with mixed anxiety and depressed mood: Secondary | ICD-10-CM | POA: Diagnosis not present

## 2023-08-29 DIAGNOSIS — B354 Tinea corporis: Secondary | ICD-10-CM | POA: Diagnosis not present

## 2023-09-04 DIAGNOSIS — I10 Essential (primary) hypertension: Secondary | ICD-10-CM | POA: Diagnosis not present

## 2023-09-04 DIAGNOSIS — E119 Type 2 diabetes mellitus without complications: Secondary | ICD-10-CM | POA: Diagnosis not present

## 2023-09-04 DIAGNOSIS — K760 Fatty (change of) liver, not elsewhere classified: Secondary | ICD-10-CM | POA: Diagnosis not present

## 2023-09-08 DIAGNOSIS — R262 Difficulty in walking, not elsewhere classified: Secondary | ICD-10-CM | POA: Diagnosis not present

## 2023-09-08 DIAGNOSIS — R269 Unspecified abnormalities of gait and mobility: Secondary | ICD-10-CM | POA: Diagnosis not present

## 2023-09-11 DIAGNOSIS — R269 Unspecified abnormalities of gait and mobility: Secondary | ICD-10-CM | POA: Diagnosis not present

## 2023-09-11 DIAGNOSIS — R262 Difficulty in walking, not elsewhere classified: Secondary | ICD-10-CM | POA: Diagnosis not present

## 2023-09-12 DIAGNOSIS — R262 Difficulty in walking, not elsewhere classified: Secondary | ICD-10-CM | POA: Diagnosis not present

## 2023-09-12 DIAGNOSIS — F4323 Adjustment disorder with mixed anxiety and depressed mood: Secondary | ICD-10-CM | POA: Diagnosis not present

## 2023-09-12 DIAGNOSIS — R269 Unspecified abnormalities of gait and mobility: Secondary | ICD-10-CM | POA: Diagnosis not present

## 2023-09-13 DIAGNOSIS — R269 Unspecified abnormalities of gait and mobility: Secondary | ICD-10-CM | POA: Diagnosis not present

## 2023-09-13 DIAGNOSIS — R262 Difficulty in walking, not elsewhere classified: Secondary | ICD-10-CM | POA: Diagnosis not present

## 2023-09-14 DIAGNOSIS — R269 Unspecified abnormalities of gait and mobility: Secondary | ICD-10-CM | POA: Diagnosis not present

## 2023-09-14 DIAGNOSIS — R262 Difficulty in walking, not elsewhere classified: Secondary | ICD-10-CM | POA: Diagnosis not present

## 2023-09-15 DIAGNOSIS — F331 Major depressive disorder, recurrent, moderate: Secondary | ICD-10-CM | POA: Diagnosis not present

## 2023-09-15 DIAGNOSIS — R269 Unspecified abnormalities of gait and mobility: Secondary | ICD-10-CM | POA: Diagnosis not present

## 2023-09-15 DIAGNOSIS — G3184 Mild cognitive impairment, so stated: Secondary | ICD-10-CM | POA: Diagnosis not present

## 2023-09-15 DIAGNOSIS — R262 Difficulty in walking, not elsewhere classified: Secondary | ICD-10-CM | POA: Diagnosis not present

## 2023-09-18 DIAGNOSIS — R262 Difficulty in walking, not elsewhere classified: Secondary | ICD-10-CM | POA: Diagnosis not present

## 2023-09-18 DIAGNOSIS — R269 Unspecified abnormalities of gait and mobility: Secondary | ICD-10-CM | POA: Diagnosis not present

## 2023-09-19 DIAGNOSIS — R262 Difficulty in walking, not elsewhere classified: Secondary | ICD-10-CM | POA: Diagnosis not present

## 2023-09-19 DIAGNOSIS — R269 Unspecified abnormalities of gait and mobility: Secondary | ICD-10-CM | POA: Diagnosis not present

## 2023-09-20 DIAGNOSIS — R269 Unspecified abnormalities of gait and mobility: Secondary | ICD-10-CM | POA: Diagnosis not present

## 2023-09-20 DIAGNOSIS — R262 Difficulty in walking, not elsewhere classified: Secondary | ICD-10-CM | POA: Diagnosis not present

## 2023-09-21 DIAGNOSIS — R269 Unspecified abnormalities of gait and mobility: Secondary | ICD-10-CM | POA: Diagnosis not present

## 2023-09-21 DIAGNOSIS — R262 Difficulty in walking, not elsewhere classified: Secondary | ICD-10-CM | POA: Diagnosis not present

## 2023-09-25 DIAGNOSIS — R262 Difficulty in walking, not elsewhere classified: Secondary | ICD-10-CM | POA: Diagnosis not present

## 2023-09-25 DIAGNOSIS — R269 Unspecified abnormalities of gait and mobility: Secondary | ICD-10-CM | POA: Diagnosis not present

## 2023-09-26 DIAGNOSIS — R269 Unspecified abnormalities of gait and mobility: Secondary | ICD-10-CM | POA: Diagnosis not present

## 2023-09-26 DIAGNOSIS — R262 Difficulty in walking, not elsewhere classified: Secondary | ICD-10-CM | POA: Diagnosis not present

## 2023-09-27 DIAGNOSIS — R262 Difficulty in walking, not elsewhere classified: Secondary | ICD-10-CM | POA: Diagnosis not present

## 2023-09-27 DIAGNOSIS — R269 Unspecified abnormalities of gait and mobility: Secondary | ICD-10-CM | POA: Diagnosis not present

## 2023-09-28 DIAGNOSIS — R262 Difficulty in walking, not elsewhere classified: Secondary | ICD-10-CM | POA: Diagnosis not present

## 2023-09-28 DIAGNOSIS — R269 Unspecified abnormalities of gait and mobility: Secondary | ICD-10-CM | POA: Diagnosis not present

## 2023-09-29 DIAGNOSIS — F4323 Adjustment disorder with mixed anxiety and depressed mood: Secondary | ICD-10-CM | POA: Diagnosis not present

## 2023-09-29 DIAGNOSIS — R262 Difficulty in walking, not elsewhere classified: Secondary | ICD-10-CM | POA: Diagnosis not present

## 2023-09-29 DIAGNOSIS — R269 Unspecified abnormalities of gait and mobility: Secondary | ICD-10-CM | POA: Diagnosis not present

## 2023-10-10 DIAGNOSIS — F4323 Adjustment disorder with mixed anxiety and depressed mood: Secondary | ICD-10-CM | POA: Diagnosis not present

## 2023-10-24 DIAGNOSIS — F4323 Adjustment disorder with mixed anxiety and depressed mood: Secondary | ICD-10-CM | POA: Diagnosis not present

## 2023-10-26 DIAGNOSIS — E1122 Type 2 diabetes mellitus with diabetic chronic kidney disease: Secondary | ICD-10-CM | POA: Diagnosis not present

## 2023-10-26 DIAGNOSIS — N189 Chronic kidney disease, unspecified: Secondary | ICD-10-CM | POA: Diagnosis not present

## 2023-10-26 DIAGNOSIS — Z7984 Long term (current) use of oral hypoglycemic drugs: Secondary | ICD-10-CM | POA: Diagnosis not present

## 2023-10-26 DIAGNOSIS — R062 Wheezing: Secondary | ICD-10-CM | POA: Diagnosis not present

## 2023-10-27 DIAGNOSIS — F331 Major depressive disorder, recurrent, moderate: Secondary | ICD-10-CM | POA: Diagnosis not present

## 2023-10-27 DIAGNOSIS — G3184 Mild cognitive impairment, so stated: Secondary | ICD-10-CM | POA: Diagnosis not present

## 2023-11-02 DIAGNOSIS — I69320 Aphasia following cerebral infarction: Secondary | ICD-10-CM | POA: Diagnosis not present

## 2023-11-02 DIAGNOSIS — E1122 Type 2 diabetes mellitus with diabetic chronic kidney disease: Secondary | ICD-10-CM | POA: Diagnosis not present

## 2023-11-02 DIAGNOSIS — Z7984 Long term (current) use of oral hypoglycemic drugs: Secondary | ICD-10-CM | POA: Diagnosis not present

## 2023-11-02 DIAGNOSIS — F324 Major depressive disorder, single episode, in partial remission: Secondary | ICD-10-CM | POA: Diagnosis not present

## 2023-11-02 DIAGNOSIS — R0989 Other specified symptoms and signs involving the circulatory and respiratory systems: Secondary | ICD-10-CM | POA: Diagnosis not present

## 2023-11-02 DIAGNOSIS — Z7985 Long-term (current) use of injectable non-insulin antidiabetic drugs: Secondary | ICD-10-CM | POA: Diagnosis not present

## 2023-11-02 DIAGNOSIS — J4520 Mild intermittent asthma, uncomplicated: Secondary | ICD-10-CM | POA: Diagnosis not present

## 2023-11-02 DIAGNOSIS — M545 Low back pain, unspecified: Secondary | ICD-10-CM | POA: Diagnosis not present

## 2023-11-02 DIAGNOSIS — N189 Chronic kidney disease, unspecified: Secondary | ICD-10-CM | POA: Diagnosis not present

## 2023-11-07 DIAGNOSIS — F4323 Adjustment disorder with mixed anxiety and depressed mood: Secondary | ICD-10-CM | POA: Diagnosis not present

## 2023-11-09 ENCOUNTER — Other Ambulatory Visit
Admission: RE | Admit: 2023-11-09 | Discharge: 2023-11-09 | Disposition: A | Discharge status: Home / Self Care | Source: Ambulatory Visit | Attending: Emergency Medicine | Admitting: Emergency Medicine

## 2023-11-09 DIAGNOSIS — R0602 Shortness of breath: Secondary | ICD-10-CM | POA: Diagnosis not present

## 2023-11-09 DIAGNOSIS — R0609 Other forms of dyspnea: Secondary | ICD-10-CM | POA: Insufficient documentation

## 2023-11-09 DIAGNOSIS — Z87898 Personal history of other specified conditions: Secondary | ICD-10-CM | POA: Diagnosis not present

## 2023-11-09 DIAGNOSIS — R7989 Other specified abnormal findings of blood chemistry: Secondary | ICD-10-CM | POA: Diagnosis not present

## 2023-11-09 DIAGNOSIS — R058 Other specified cough: Secondary | ICD-10-CM | POA: Insufficient documentation

## 2023-11-09 DIAGNOSIS — R0981 Nasal congestion: Secondary | ICD-10-CM | POA: Diagnosis not present

## 2023-11-09 LAB — D-DIMER, QUANTITATIVE: D-Dimer, Quant: 1.47 ug{FEU}/mL — ABNORMAL HIGH (ref 0.00–0.50)

## 2023-11-10 ENCOUNTER — Other Ambulatory Visit: Payer: Self-pay

## 2023-11-10 ENCOUNTER — Ambulatory Visit: Admission: RE | Admit: 2023-11-10 | Source: Ambulatory Visit

## 2023-11-10 ENCOUNTER — Emergency Department

## 2023-11-10 ENCOUNTER — Emergency Department
Admission: EM | Admit: 2023-11-10 | Discharge: 2023-11-10 | Disposition: A | Discharge status: Still In Hospital | Attending: Emergency Medicine | Admitting: Emergency Medicine

## 2023-11-10 ENCOUNTER — Other Ambulatory Visit: Payer: Self-pay | Admitting: Emergency Medicine

## 2023-11-10 DIAGNOSIS — R062 Wheezing: Secondary | ICD-10-CM | POA: Diagnosis not present

## 2023-11-10 DIAGNOSIS — I517 Cardiomegaly: Secondary | ICD-10-CM | POA: Diagnosis not present

## 2023-11-10 DIAGNOSIS — G459 Transient cerebral ischemic attack, unspecified: Secondary | ICD-10-CM | POA: Diagnosis not present

## 2023-11-10 DIAGNOSIS — E119 Type 2 diabetes mellitus without complications: Secondary | ICD-10-CM | POA: Insufficient documentation

## 2023-11-10 DIAGNOSIS — R058 Other specified cough: Secondary | ICD-10-CM

## 2023-11-10 DIAGNOSIS — R4182 Altered mental status, unspecified: Secondary | ICD-10-CM | POA: Diagnosis not present

## 2023-11-10 DIAGNOSIS — R0602 Shortness of breath: Secondary | ICD-10-CM | POA: Diagnosis not present

## 2023-11-10 DIAGNOSIS — R791 Abnormal coagulation profile: Secondary | ICD-10-CM | POA: Diagnosis not present

## 2023-11-10 DIAGNOSIS — R7989 Other specified abnormal findings of blood chemistry: Secondary | ICD-10-CM

## 2023-11-10 DIAGNOSIS — R053 Chronic cough: Secondary | ICD-10-CM | POA: Diagnosis not present

## 2023-11-10 DIAGNOSIS — I10 Essential (primary) hypertension: Secondary | ICD-10-CM | POA: Insufficient documentation

## 2023-11-10 DIAGNOSIS — Z7401 Bed confinement status: Secondary | ICD-10-CM | POA: Diagnosis not present

## 2023-11-10 LAB — CBC
HCT: 44.5 % (ref 39.0–52.0)
Hemoglobin: 14 g/dL (ref 13.0–17.0)
MCH: 28.6 pg (ref 26.0–34.0)
MCHC: 31.5 g/dL (ref 30.0–36.0)
MCV: 90.8 fL (ref 80.0–100.0)
Platelets: 274 K/uL (ref 150–400)
RBC: 4.9 MIL/uL (ref 4.22–5.81)
RDW: 13.6 % (ref 11.5–15.5)
WBC: 12.2 K/uL — ABNORMAL HIGH (ref 4.0–10.5)
nRBC: 0 % (ref 0.0–0.2)

## 2023-11-10 LAB — BASIC METABOLIC PANEL WITH GFR
Anion gap: 14 (ref 5–15)
BUN: 24 mg/dL — ABNORMAL HIGH (ref 8–23)
CO2: 21 mmol/L — ABNORMAL LOW (ref 22–32)
Calcium: 9.1 mg/dL (ref 8.9–10.3)
Chloride: 104 mmol/L (ref 98–111)
Creatinine, Ser: 1.68 mg/dL — ABNORMAL HIGH (ref 0.61–1.24)
GFR, Estimated: 43 mL/min — ABNORMAL LOW (ref >60)
Glucose, Bld: 201 mg/dL — ABNORMAL HIGH (ref 70–99)
Potassium: 4.4 mmol/L (ref 3.5–5.1)
Sodium: 139 mmol/L (ref 135–145)

## 2023-11-10 MED ORDER — IOHEXOL 350 MG/ML SOLN
50.0000 mL | Freq: Once | INTRAVENOUS | Status: AC | PRN
  Administered 2023-11-10: 50 mL via INTRAVENOUS

## 2023-11-10 MED ORDER — SODIUM CHLORIDE 0.9 % IV BOLUS
500.0000 mL | Freq: Once | INTRAVENOUS | Status: AC
  Administered 2023-11-10: 500 mL via INTRAVENOUS

## 2023-11-10 MED ORDER — IOHEXOL 300 MG/ML  SOLN: 80.0000 mL | Freq: Once | INTRAMUSCULAR | Status: DC | PRN

## 2023-11-10 NOTE — ED Triage Notes (Signed)
 See first nurse note. Pt denies shob, complaint.

## 2023-11-10 NOTE — ED Triage Notes (Signed)
 Arrives from facility in [place] via [facility].  SEen by pulmonology yesterday and there was concern that patient may have a PE, recommended to come to ED for Eval.  Patient is without complaint.  Has chronic cough and wheezing.  Has a DNR  AAOx3. Skin warm and dry.  No SOB/ DOE NAD  127/87 95 97% RA CBG: 199

## 2023-11-10 NOTE — Discharge Instructions (Signed)
 You were evaluated in the emergency department today to make sure that you did not have a blood clot in the lungs.  We did a CT of your chest to evaluate this which was negative for blood clot.  Your blood work showed that you may have been a little bit dehydrated so you were given some IV fluids.  Everything else was reassuring.  Please follow-up with your primary care provider and your pulmonologist if you continue to have any symptoms of shortness of breath.

## 2023-11-10 NOTE — ED Provider Notes (Signed)
 [facility] Provider Note    Event Date/Time   First MD Initiated Contact with Patient 11/10/23 1124     (approximate)   History   abnormal imaging   HPI  Keith Carter is a 67 y.o. male with PMH of chronic pain, CVA, type 2 diabetes, hypertension presents for evaluation of a possible PE.  Patient was seen yesterday by pulmonology and had a positive D-dimer so was recommended to come to the emergency department for evaluation.  Patient has no complaints at this time and states he does not know why he is in the emergency department.  Denies shortness of breath, hemoptysis.  States he has had a cough.  No chest pain.     Physical Exam   Triage Vital Signs: ED Triage Vitals [11/10/23 1108]  Encounter Vitals Group     BP 126/84     Girls Systolic BP Percentile      Girls Diastolic BP Percentile      Boys Systolic BP Percentile      Boys Diastolic BP Percentile      Pulse Rate (!) 101     Resp (!) 22     Temp 98.3 F (36.8 C)     Temp src      SpO2 100 %     Weight 220 lb (99.8 kg)     Height 6' (1.829 m)     Head Circumference      Peak Flow      Pain Score 0     Pain Loc      Pain Education      Exclude from Growth Chart     Most recent vital signs: Vitals:   11/10/23 1108  BP: 126/84  Pulse: (!) 101  Resp: (!) 22  Temp: 98.3 F (36.8 C)  SpO2: 100%   General: Awake, no distress.  CV:  Good peripheral perfusion.  RRR. Resp:  Normal effort.  CTAB. Abd:  No distention.  Other:     ED Results / Procedures / Treatments   Labs (all labs ordered are listed, but only abnormal results are displayed) Labs Reviewed  CBC - Abnormal; Notable for the following components:      Result Value   WBC 12.2 (*)    All other components within normal limits  BASIC METABOLIC PANEL WITH GFR - Abnormal; Notable for the following components:   CO2 21 (*)    Glucose, Bld 201 (*)    BUN 24 (*)    Creatinine, Ser 1.68 (*)    GFR, Estimated 43 (*)     All other components within normal limits     EKG  ED provider interpretation: Right bundle branch block.  Vent. rate 96 BPM PR interval 164 ms QRS duration 138 ms QT/QTcB 378/477 ms P-R-T axes 46 263 9   RADIOLOGY  Chest x-ray and CT angio chest obtained, interpreted the images as well as reviewed the radiologist report.  Chest x-ray is negative for any acute cardiopulmonary abnormalities.  CT angio was negative for pulmonary embolism.  CT angio chest PE IMPRESSION:  1. No evidence of pulmonary embolism.  2. Mild bibasilar atelectasis, left greater than right, improved.  3. Right upper quadrant ventral hernia containing hepatic flexure of the colon  without obstruction or strangulation, incompletely visualized.  4. Small hiatal hernia.    PROCEDURES:  Critical Care performed: No  Procedures   MEDICATIONS ORDERED IN ED: Medications  sodium chloride  0.9 % bolus 500  mL (500 mLs Intravenous New Bag/Given 11/10/23 1218)  iohexol  (OMNIPAQUE ) 350 MG/ML injection 50 mL (50 mLs Intravenous Contrast Given 11/10/23 1242)     IMPRESSION / MDM / ASSESSMENT AND PLAN / ED COURSE  I reviewed the triage vital signs and the nursing notes.                             68 year old male presents for PE workup given positive D-dimer, patient was tachycardic and tachypneic upon initial presentation, vital signs stable otherwise.  Differential diagnosis includes, but is not limited to, PE, chronic cough, pneumonia, pneumothorax.  Patient's presentation is most consistent with acute complicated illness / injury requiring diagnostic workup.  CBC shows mild leukocytosis of 12.2.  BMP shows elevated glucose, elevated creatinine and elevated BUN. Will give patient 500 mL fluid bolus. I reviewed his last echo from 3 years ago and EF was 40% so do not want to fluid overload him.  EKG shows right bundle branch block consistent with previous EKGs.  Chest x-ray is negative for pneumonia and  pneumothorax.  Patient is moderate risk based on Wells Criteria. Will plan to get CT PE study given positive D-dimer.  If negative will discharge patient home as he does not have any other complaints.  CT angio is negative for PE.  Clinical Course as of 11/10/23 1349  Fri Nov 10, 2023  1346 Patient reassessed and states he is still asymptomatic.  Will discharge patient.  Advised him to follow-up with pulmonology for further evaluation of any shortness of breath.  Patient voiced understanding, all questions were answered and he was stable at discharge. [LD]    Clinical Course User Index [LD] Cleaster Tinnie LABOR, PA-C     FINAL CLINICAL IMPRESSION(S) / ED DIAGNOSES   Final diagnoses:  Positive D dimer     Rx / DC Orders   ED Discharge Orders     None        Note:  This document was prepared using Dragon voice recognition software and may include unintentional dictation errors.   Cleaster Tinnie LABOR, PA-C 11/10/23 1350    Viviann Pastor, MD 11/10/23 1934

## 2023-11-13 DIAGNOSIS — R062 Wheezing: Secondary | ICD-10-CM | POA: Diagnosis not present

## 2023-11-13 DIAGNOSIS — E1122 Type 2 diabetes mellitus with diabetic chronic kidney disease: Secondary | ICD-10-CM | POA: Diagnosis not present

## 2023-11-13 DIAGNOSIS — I69320 Aphasia following cerebral infarction: Secondary | ICD-10-CM | POA: Diagnosis not present

## 2023-11-13 DIAGNOSIS — M545 Low back pain, unspecified: Secondary | ICD-10-CM | POA: Diagnosis not present

## 2023-11-13 DIAGNOSIS — N189 Chronic kidney disease, unspecified: Secondary | ICD-10-CM | POA: Diagnosis not present

## 2023-11-13 DIAGNOSIS — E041 Nontoxic single thyroid nodule: Secondary | ICD-10-CM | POA: Diagnosis not present

## 2023-11-13 DIAGNOSIS — Z7984 Long term (current) use of oral hypoglycemic drugs: Secondary | ICD-10-CM | POA: Diagnosis not present

## 2023-11-14 DIAGNOSIS — E049 Nontoxic goiter, unspecified: Secondary | ICD-10-CM | POA: Diagnosis not present

## 2023-11-20 DIAGNOSIS — E119 Type 2 diabetes mellitus without complications: Secondary | ICD-10-CM | POA: Diagnosis not present

## 2023-11-20 DIAGNOSIS — E782 Mixed hyperlipidemia: Secondary | ICD-10-CM | POA: Diagnosis not present

## 2023-11-20 DIAGNOSIS — I10 Essential (primary) hypertension: Secondary | ICD-10-CM | POA: Diagnosis not present

## 2023-11-20 DIAGNOSIS — I2699 Other pulmonary embolism without acute cor pulmonale: Secondary | ICD-10-CM | POA: Diagnosis not present

## 2023-11-20 DIAGNOSIS — G459 Transient cerebral ischemic attack, unspecified: Secondary | ICD-10-CM | POA: Diagnosis not present

## 2023-11-21 DIAGNOSIS — F4323 Adjustment disorder with mixed anxiety and depressed mood: Secondary | ICD-10-CM | POA: Diagnosis not present

## 2023-11-28 DIAGNOSIS — G8929 Other chronic pain: Secondary | ICD-10-CM | POA: Diagnosis not present

## 2023-11-28 DIAGNOSIS — I10 Essential (primary) hypertension: Secondary | ICD-10-CM | POA: Diagnosis not present

## 2023-11-29 DIAGNOSIS — E041 Nontoxic single thyroid nodule: Secondary | ICD-10-CM | POA: Diagnosis not present

## 2023-11-29 DIAGNOSIS — R058 Other specified cough: Secondary | ICD-10-CM | POA: Diagnosis not present

## 2023-11-29 DIAGNOSIS — F331 Major depressive disorder, recurrent, moderate: Secondary | ICD-10-CM | POA: Diagnosis not present

## 2023-11-29 DIAGNOSIS — E669 Obesity, unspecified: Secondary | ICD-10-CM | POA: Diagnosis not present

## 2023-11-29 DIAGNOSIS — J9811 Atelectasis: Secondary | ICD-10-CM | POA: Diagnosis not present

## 2023-11-29 DIAGNOSIS — G3184 Mild cognitive impairment, so stated: Secondary | ICD-10-CM | POA: Diagnosis not present

## 2023-11-29 DIAGNOSIS — R0981 Nasal congestion: Secondary | ICD-10-CM | POA: Diagnosis not present

## 2023-11-29 DIAGNOSIS — R0609 Other forms of dyspnea: Secondary | ICD-10-CM | POA: Diagnosis not present

## 2023-11-29 DIAGNOSIS — Z87898 Personal history of other specified conditions: Secondary | ICD-10-CM | POA: Diagnosis not present

## 2023-11-29 DIAGNOSIS — J301 Allergic rhinitis due to pollen: Secondary | ICD-10-CM | POA: Diagnosis not present

## 2023-12-07 DIAGNOSIS — G459 Transient cerebral ischemic attack, unspecified: Secondary | ICD-10-CM | POA: Diagnosis not present

## 2023-12-07 DIAGNOSIS — I2699 Other pulmonary embolism without acute cor pulmonale: Secondary | ICD-10-CM | POA: Diagnosis not present

## 2023-12-11 DIAGNOSIS — E1122 Type 2 diabetes mellitus with diabetic chronic kidney disease: Secondary | ICD-10-CM | POA: Diagnosis not present

## 2023-12-11 DIAGNOSIS — J9811 Atelectasis: Secondary | ICD-10-CM | POA: Diagnosis not present

## 2023-12-11 DIAGNOSIS — I69320 Aphasia following cerebral infarction: Secondary | ICD-10-CM | POA: Diagnosis not present

## 2023-12-11 DIAGNOSIS — E041 Nontoxic single thyroid nodule: Secondary | ICD-10-CM | POA: Diagnosis not present

## 2023-12-11 DIAGNOSIS — R600 Localized edema: Secondary | ICD-10-CM | POA: Diagnosis not present

## 2023-12-11 DIAGNOSIS — N189 Chronic kidney disease, unspecified: Secondary | ICD-10-CM | POA: Diagnosis not present

## 2023-12-11 DIAGNOSIS — Z7984 Long term (current) use of oral hypoglycemic drugs: Secondary | ICD-10-CM | POA: Diagnosis not present

## 2023-12-25 DIAGNOSIS — E041 Nontoxic single thyroid nodule: Secondary | ICD-10-CM | POA: Diagnosis not present

## 2023-12-25 DIAGNOSIS — E66813 Obesity, class 3: Secondary | ICD-10-CM | POA: Diagnosis not present

## 2023-12-25 DIAGNOSIS — E119 Type 2 diabetes mellitus without complications: Secondary | ICD-10-CM | POA: Diagnosis not present

## 2023-12-25 DIAGNOSIS — F331 Major depressive disorder, recurrent, moderate: Secondary | ICD-10-CM | POA: Diagnosis not present

## 2023-12-25 DIAGNOSIS — Z6841 Body Mass Index (BMI) 40.0 and over, adult: Secondary | ICD-10-CM | POA: Diagnosis not present

## 2023-12-25 DIAGNOSIS — G3184 Mild cognitive impairment, so stated: Secondary | ICD-10-CM | POA: Diagnosis not present

## 2024-01-02 DIAGNOSIS — F4323 Adjustment disorder with mixed anxiety and depressed mood: Secondary | ICD-10-CM | POA: Diagnosis not present
